# Patient Record
Sex: Male | Born: 1995 | Race: Black or African American | Hispanic: No | Marital: Single | Smoking: Never smoker
Health system: Southern US, Community
[De-identification: ages and names within clinical notes are randomized; demographics above are authoritative.]

## PROBLEM LIST (undated history)

## (undated) DIAGNOSIS — E785 Hyperlipidemia, unspecified: Secondary | ICD-10-CM

## (undated) DIAGNOSIS — I509 Heart failure, unspecified: Secondary | ICD-10-CM

## (undated) DIAGNOSIS — I513 Intracardiac thrombosis, not elsewhere classified: Secondary | ICD-10-CM

## (undated) DIAGNOSIS — I2699 Other pulmonary embolism without acute cor pulmonale: Secondary | ICD-10-CM

## (undated) DIAGNOSIS — I441 Atrioventricular block, second degree: Secondary | ICD-10-CM

## (undated) DIAGNOSIS — I429 Cardiomyopathy, unspecified: Secondary | ICD-10-CM

## (undated) DIAGNOSIS — E119 Type 2 diabetes mellitus without complications: Secondary | ICD-10-CM

## (undated) DIAGNOSIS — I5020 Unspecified systolic (congestive) heart failure: Secondary | ICD-10-CM

## (undated) DIAGNOSIS — I639 Cerebral infarction, unspecified: Secondary | ICD-10-CM

## (undated) DIAGNOSIS — I10 Essential (primary) hypertension: Secondary | ICD-10-CM

## (undated) HISTORY — DX: Intracardiac thrombosis, not elsewhere classified: I51.3

## (undated) HISTORY — DX: Unspecified systolic (congestive) heart failure: I50.20

## (undated) HISTORY — DX: Cardiomyopathy, unspecified: I42.9

## (undated) HISTORY — DX: Hyperlipidemia, unspecified: E78.5

## (undated) HISTORY — DX: Heart failure, unspecified: I50.9

## (undated) HISTORY — DX: Atrioventricular block, second degree: I44.1

## (undated) HISTORY — PX: HAND SURGERY: SHX662

## (undated) HISTORY — DX: Other pulmonary embolism without acute cor pulmonale: I26.99

## (undated) HISTORY — DX: Cerebral infarction, unspecified: I63.9

---

## 2014-05-25 DIAGNOSIS — Z0000 Encounter for general adult medical examination without abnormal findings: Secondary | ICD-10-CM | POA: Insufficient documentation

## 2014-05-25 DIAGNOSIS — E669 Obesity, unspecified: Secondary | ICD-10-CM | POA: Insufficient documentation

## 2014-05-25 DIAGNOSIS — R5381 Other malaise: Secondary | ICD-10-CM | POA: Insufficient documentation

## 2018-06-30 ENCOUNTER — Encounter: Payer: Self-pay | Admitting: Emergency Medicine

## 2018-06-30 ENCOUNTER — Other Ambulatory Visit: Payer: Self-pay

## 2018-06-30 ENCOUNTER — Ambulatory Visit
Admission: EM | Admit: 2018-06-30 | Discharge: 2018-06-30 | Disposition: A | Discharge status: Home / Self Care | Payer: Self-pay | Attending: Family Medicine | Admitting: Family Medicine

## 2018-06-30 DIAGNOSIS — L02811 Cutaneous abscess of head [any part, except face]: Secondary | ICD-10-CM

## 2018-06-30 MED ORDER — SULFAMETHOXAZOLE-TRIMETHOPRIM 800-160 MG PO TABS: 1.0000 | ORAL_TABLET | Freq: Two times a day (BID) | ORAL | 0 refills | Status: DC

## 2018-06-30 NOTE — ED Triage Notes (Signed)
Pt is here today for what he thinks is an insect bite on the back of his neck. He noticed it 4 days ago and has progressively gotten worse since. He had a fever of 100.9 on Saturday. Pt family tried to drain the area and have used peroxide, neosporin and ibuprofen.

## 2018-06-30 NOTE — ED Provider Notes (Signed)
[facility]    CSN: [id number] Arrival date & time: 06/30/18  1833     History   Chief Complaint No chief complaint on file.   HPI Robert Mccann is a 22 y.o. male.   22 yo male with a c/o a bump on the back of his head for 4 days but is now painful and has been draining. States he had a fever over the weekend.   The history is provided by the patient.    History reviewed. No pertinent past medical history.  There are no active problems to display for this patient.   Past Surgical History:  Procedure Laterality Date  . HAND SURGERY Left age 67       Home Medications    Prior to Admission medications   Medication Sig Start Date End Date Taking? Authorizing Provider  sulfamethoxazole-trimethoprim (BACTRIM DS,SEPTRA DS) 800-160 MG tablet Take 1 tablet by mouth 2 (two) times daily. 06/30/18   Payton Mccallum, MD    Family History No family history on file.  Social History Social History   Tobacco Use  . Smoking status: Never Smoker  . Smokeless tobacco: Never Used  Substance Use Topics  . Alcohol use: Never    Frequency: Never  . Drug use: Never     Allergies   Patient has no allergy information on record.   Review of Systems Review of Systems   Physical Exam Triage Vital Signs ED Triage Vitals  Enc Vitals Group     BP 06/30/18 1859 (!) 159/117     Pulse Rate 06/30/18 1859 (!) 109     Resp 06/30/18 1859 18     Temp 06/30/18 1859 98.8 F (37.1 C)     Temp Source 06/30/18 1859 Oral     SpO2 06/30/18 1859 99 %     Weight 06/30/18 1855 (!) 370 lb (167.8 kg)     Height 06/30/18 1855 5\' 11"  (1.803 m)     Head Circumference --      Peak Flow --      Pain Score 06/30/18 1855 9     Pain Loc --      Pain Edu? --      Excl. in GC? --    No data found.  Updated Vital Signs BP (!) 159/117 (BP Location: Left Arm)   Pulse (!) 109   Temp 98.8 F (37.1 C) (Oral)   Resp 18   Ht 5\' 11"  (1.803 m)   Wt (!) 370 lb (167.8 kg)   SpO2 99%    BMI 51.60 kg/m   Visual Acuity Right Eye Distance:   Left Eye Distance:   Bilateral Distance:    Right Eye Near:   Left Eye Near:    Bilateral Near:     Physical Exam  Constitutional: He appears well-developed and well-nourished. No distress.  Skin: He is not diaphoretic. There is erythema.  3x4cm skin area on posterior scalp with fluctuance, blanchable erythema, warmth, tenderness to palpation and spontaneous drainage of pus  Nursing note and vitals reviewed.    UC Treatments / Results  Labs (all labs ordered are listed, but only abnormal results are displayed) Labs Reviewed - No data to display  EKG None  Radiology No results found.  Procedures Procedures (including critical care time)  Medications Ordered in UC Medications - No data to display  Initial Impression / Assessment and Plan / UC Course  I have reviewed the triage vital signs and the nursing notes.  Pertinent labs & imaging results that were available during my care of the patient were reviewed by me and considered in my medical decision making (see chart for details).      Final Clinical Impressions(s) / UC Diagnoses   Final diagnoses:  Scalp abscess     Discharge Instructions     Warm compresses to area     ED Prescriptions    Medication Sig Dispense Auth. Provider   sulfamethoxazole-trimethoprim (BACTRIM DS,SEPTRA DS) 800-160 MG tablet Take 1 tablet by mouth 2 (two) times daily. 20 tablet Payton Mccallum, MD     1. diagnosis reviewed with patient 2. rx as per orders above; reviewed possible side effects, interactions, risks and benefits  3. Recommend supportive treatment with warm compresses to area, otc analgesics prn 4. Follow-up prn if symptoms worsen or don't improve  Controlled Substance Prescriptions [place] Controlled Substance Registry consulted? Not Applicable   Payton Mccallum, MD 06/30/18 [phone]

## 2018-06-30 NOTE — Discharge Instructions (Signed)
Warm compresses to area °

## 2019-08-17 DIAGNOSIS — Z7189 Other specified counseling: Secondary | ICD-10-CM | POA: Diagnosis not present

## 2019-08-17 DIAGNOSIS — Z1389 Encounter for screening for other disorder: Secondary | ICD-10-CM | POA: Diagnosis not present

## 2019-08-17 DIAGNOSIS — R030 Elevated blood-pressure reading, without diagnosis of hypertension: Secondary | ICD-10-CM | POA: Diagnosis not present

## 2019-08-19 DIAGNOSIS — H6123 Impacted cerumen, bilateral: Secondary | ICD-10-CM | POA: Diagnosis not present

## 2019-08-19 DIAGNOSIS — Z833 Family history of diabetes mellitus: Secondary | ICD-10-CM | POA: Diagnosis not present

## 2019-08-19 DIAGNOSIS — R030 Elevated blood-pressure reading, without diagnosis of hypertension: Secondary | ICD-10-CM | POA: Diagnosis not present

## 2019-10-05 ENCOUNTER — Ambulatory Visit
Admission: EM | Admit: 2019-10-05 | Discharge: 2019-10-05 | Disposition: A | Discharge status: Other Acute Inpatient Hospital | Payer: Self-pay

## 2020-07-28 DIAGNOSIS — Z20822 Contact with and (suspected) exposure to covid-19: Secondary | ICD-10-CM | POA: Diagnosis not present

## 2021-03-25 DIAGNOSIS — Z20822 Contact with and (suspected) exposure to covid-19: Secondary | ICD-10-CM | POA: Diagnosis not present

## 2021-06-27 ENCOUNTER — Emergency Department: Payer: BC Managed Care – PPO

## 2021-06-27 ENCOUNTER — Other Ambulatory Visit: Payer: Self-pay

## 2021-06-27 ENCOUNTER — Inpatient Hospital Stay
Admission: EM | Admit: 2021-06-27 | Discharge: 2021-06-30 | DRG: 163 | Disposition: A | Discharge status: Home / Self Care | Payer: BC Managed Care – PPO | Attending: Internal Medicine | Admitting: Internal Medicine

## 2021-06-27 DIAGNOSIS — R739 Hyperglycemia, unspecified: Secondary | ICD-10-CM

## 2021-06-27 DIAGNOSIS — I5021 Acute systolic (congestive) heart failure: Secondary | ICD-10-CM | POA: Diagnosis not present

## 2021-06-27 DIAGNOSIS — G4733 Obstructive sleep apnea (adult) (pediatric): Secondary | ICD-10-CM | POA: Diagnosis not present

## 2021-06-27 DIAGNOSIS — I110 Hypertensive heart disease with heart failure: Secondary | ICD-10-CM | POA: Diagnosis present

## 2021-06-27 DIAGNOSIS — E119 Type 2 diabetes mellitus without complications: Secondary | ICD-10-CM

## 2021-06-27 DIAGNOSIS — E66813 Obesity, class 3: Secondary | ICD-10-CM

## 2021-06-27 DIAGNOSIS — I509 Heart failure, unspecified: Secondary | ICD-10-CM

## 2021-06-27 DIAGNOSIS — I2609 Other pulmonary embolism with acute cor pulmonale: Principal | ICD-10-CM

## 2021-06-27 DIAGNOSIS — R079 Chest pain, unspecified: Secondary | ICD-10-CM | POA: Diagnosis not present

## 2021-06-27 DIAGNOSIS — I10 Essential (primary) hypertension: Secondary | ICD-10-CM | POA: Diagnosis present

## 2021-06-27 DIAGNOSIS — R7989 Other specified abnormal findings of blood chemistry: Secondary | ICD-10-CM

## 2021-06-27 DIAGNOSIS — E1165 Type 2 diabetes mellitus with hyperglycemia: Secondary | ICD-10-CM | POA: Diagnosis present

## 2021-06-27 DIAGNOSIS — I517 Cardiomegaly: Secondary | ICD-10-CM | POA: Diagnosis not present

## 2021-06-27 DIAGNOSIS — R0789 Other chest pain: Secondary | ICD-10-CM | POA: Diagnosis not present

## 2021-06-27 DIAGNOSIS — J90 Pleural effusion, not elsewhere classified: Secondary | ICD-10-CM | POA: Diagnosis not present

## 2021-06-27 DIAGNOSIS — Z20822 Contact with and (suspected) exposure to covid-19: Secondary | ICD-10-CM | POA: Diagnosis not present

## 2021-06-27 DIAGNOSIS — I420 Dilated cardiomyopathy: Secondary | ICD-10-CM | POA: Diagnosis not present

## 2021-06-27 DIAGNOSIS — Z833 Family history of diabetes mellitus: Secondary | ICD-10-CM | POA: Diagnosis not present

## 2021-06-27 DIAGNOSIS — Z8249 Family history of ischemic heart disease and other diseases of the circulatory system: Secondary | ICD-10-CM | POA: Diagnosis not present

## 2021-06-27 DIAGNOSIS — I2699 Other pulmonary embolism without acute cor pulmonale: Secondary | ICD-10-CM | POA: Diagnosis present

## 2021-06-27 DIAGNOSIS — E1169 Type 2 diabetes mellitus with other specified complication: Secondary | ICD-10-CM

## 2021-06-27 DIAGNOSIS — Z6841 Body Mass Index (BMI) 40.0 and over, adult: Secondary | ICD-10-CM | POA: Diagnosis not present

## 2021-06-27 DIAGNOSIS — Z86711 Personal history of pulmonary embolism: Secondary | ICD-10-CM | POA: Diagnosis not present

## 2021-06-27 DIAGNOSIS — I5020 Unspecified systolic (congestive) heart failure: Secondary | ICD-10-CM

## 2021-06-27 DIAGNOSIS — I429 Cardiomyopathy, unspecified: Secondary | ICD-10-CM | POA: Diagnosis not present

## 2021-06-27 DIAGNOSIS — R600 Localized edema: Secondary | ICD-10-CM | POA: Diagnosis not present

## 2021-06-27 DIAGNOSIS — R0902 Hypoxemia: Secondary | ICD-10-CM | POA: Diagnosis present

## 2021-06-27 DIAGNOSIS — R0602 Shortness of breath: Secondary | ICD-10-CM

## 2021-06-27 DIAGNOSIS — Z9114 Patient's other noncompliance with medication regimen: Secondary | ICD-10-CM | POA: Diagnosis not present

## 2021-06-27 DIAGNOSIS — I428 Other cardiomyopathies: Secondary | ICD-10-CM | POA: Diagnosis not present

## 2021-06-27 DIAGNOSIS — R778 Other specified abnormalities of plasma proteins: Secondary | ICD-10-CM

## 2021-06-27 DIAGNOSIS — E1141 Type 2 diabetes mellitus with diabetic mononeuropathy: Secondary | ICD-10-CM

## 2021-06-27 DIAGNOSIS — R160 Hepatomegaly, not elsewhere classified: Secondary | ICD-10-CM | POA: Diagnosis not present

## 2021-06-27 HISTORY — DX: Type 2 diabetes mellitus without complications: E11.9

## 2021-06-27 HISTORY — DX: Essential (primary) hypertension: I10

## 2021-06-27 LAB — BASIC METABOLIC PANEL
Anion gap: 7 (ref 5–15)
BUN: 9 mg/dL (ref 6–20)
CO2: 25 mmol/L (ref 22–32)
Calcium: 8.8 mg/dL — ABNORMAL LOW (ref 8.9–10.3)
Chloride: 104 mmol/L (ref 98–111)
Creatinine, Ser: 0.95 mg/dL (ref 0.61–1.24)
GFR, Estimated: >60 mL/min (ref >60)
Glucose, Bld: 189 mg/dL — ABNORMAL HIGH (ref 70–99)
Potassium: 3.7 mmol/L (ref 3.5–5.1)
Sodium: 136 mmol/L (ref 135–145)

## 2021-06-27 LAB — CBC
HCT: 43.6 % (ref 39.0–52.0)
Hemoglobin: 14.7 g/dL (ref 13.0–17.0)
MCH: 30.6 pg (ref 26.0–34.0)
MCHC: 33.7 g/dL (ref 30.0–36.0)
MCV: 90.6 fL (ref 80.0–100.0)
Platelets: 278 10*3/uL (ref 150–400)
RBC: 4.81 MIL/uL (ref 4.22–5.81)
RDW: 12.2 % (ref 11.5–15.5)
WBC: 8.1 10*3/uL (ref 4.0–10.5)
nRBC: 0 % (ref 0.0–0.2)

## 2021-06-27 LAB — RESP PANEL BY RT-PCR (FLU A&B, COVID) ARPGX2
Influenza A by PCR: NEGATIVE
Influenza B by PCR: NEGATIVE
SARS Coronavirus 2 by RT PCR: NEGATIVE

## 2021-06-27 LAB — PROTIME-INR
INR: 1.1 (ref 0.8–1.2)
Prothrombin Time: 14.2 seconds (ref 11.4–15.2)

## 2021-06-27 LAB — TROPONIN I (HIGH SENSITIVITY)
Troponin I (High Sensitivity): 38 ng/L — ABNORMAL HIGH (ref <18)
Troponin I (High Sensitivity): 35 ng/L — ABNORMAL HIGH (ref <18)

## 2021-06-27 LAB — BRAIN NATRIURETIC PEPTIDE: B Natriuretic Peptide: 740.6 pg/mL — ABNORMAL HIGH (ref 0.0–100.0)

## 2021-06-27 LAB — FIBRINOGEN: Fibrinogen: 510 mg/dL — ABNORMAL HIGH (ref 210–475)

## 2021-06-27 LAB — APTT: aPTT: 30 seconds (ref 24–36)

## 2021-06-27 MED ORDER — SODIUM CHLORIDE 0.9 % IV SOLN: 250.0000 mL | INTRAVENOUS | Status: DC | PRN

## 2021-06-27 MED ORDER — HEPARIN BOLUS VIA INFUSION
7000.0000 [IU] | Freq: Once | INTRAVENOUS | Status: AC
  Administered 2021-06-27: 7000 [IU] via INTRAVENOUS
  Filled 2021-06-27: qty 7000

## 2021-06-27 MED ORDER — ACETAMINOPHEN 650 MG RE SUPP: 650.0000 mg | Freq: Four times a day (QID) | RECTAL | Status: DC | PRN

## 2021-06-27 MED ORDER — SODIUM CHLORIDE 0.9% FLUSH: 3.0000 mL | INTRAVENOUS | Status: DC | PRN

## 2021-06-27 MED ORDER — ACETAMINOPHEN 325 MG PO TABS: 650.0000 mg | ORAL_TABLET | Freq: Four times a day (QID) | ORAL | Status: DC | PRN

## 2021-06-27 MED ORDER — SODIUM CHLORIDE 0.9% FLUSH
3.0000 mL | Freq: Two times a day (BID) | INTRAVENOUS | Status: DC
  Administered 2021-06-28 – 2021-06-30 (×4): 3 mL via INTRAVENOUS

## 2021-06-27 MED ORDER — IOHEXOL 350 MG/ML SOLN
100.0000 mL | Freq: Once | INTRAVENOUS | Status: AC | PRN
  Administered 2021-06-27: 100 mL via INTRAVENOUS
  Filled 2021-06-27: qty 100

## 2021-06-27 MED ORDER — HEPARIN (PORCINE) 25000 UT/250ML-% IV SOLN
2100.0000 [IU]/h | INTRAVENOUS | Status: AC
  Administered 2021-06-27 – 2021-06-28 (×2): 2000 [IU]/h via INTRAVENOUS
  Administered 2021-06-29: 2100 [IU]/h via INTRAVENOUS
  Filled 2021-06-27 (×4): qty 250

## 2021-06-27 MED ORDER — INSULIN ASPART 100 UNIT/ML IJ SOLN
0.0000 [IU] | Freq: Three times a day (TID) | INTRAMUSCULAR | Status: DC
  Administered 2021-06-28 – 2021-06-29 (×2): 5 [IU] via SUBCUTANEOUS
  Administered 2021-06-29: 2 [IU] via SUBCUTANEOUS
  Administered 2021-06-29: 4 [IU] via SUBCUTANEOUS
  Administered 2021-06-30: 3 [IU] via SUBCUTANEOUS
  Filled 2021-06-27 (×5): qty 1

## 2021-06-27 MED ORDER — ASPIRIN 81 MG PO CHEW
324.0000 mg | CHEWABLE_TABLET | Freq: Once | ORAL | Status: AC
Start: 2021-06-27 — End: 2021-06-27
  Administered 2021-06-27: 324 mg via ORAL
  Filled 2021-06-27: qty 4

## 2021-06-27 NOTE — ED Notes (Signed)
Dr. Patel at bedside 

## 2021-06-27 NOTE — ED Provider Notes (Signed)
[facility] Emergency Department Provider Note  ____________________________________________   Event Date/Time   First MD Initiated Contact with Patient 06/27/21 1918     (approximate)  I have reviewed the triage vital signs and the nursing notes.   HISTORY  Chief Complaint Chest Pain   HPI Robert Mccann is a 25 y.o. male with past medical history of HTN, DM and obesity who presents accompanied by his mother for assessment of 3 to 4 days of some substernal chest pain described as achy and pressure-like associated with some shortness of breath worsened by exertion, nonproductive cough and some nausea and vomiting yesterday.  Patient denies any fevers, headache, earache, abdominal pain, diarrhea, dysuria, rash or focal extremity weakness or numbness or tingling.  No cardiac or pulmonary history.  He has strong family history of this however.  No EtOH or illicit or tobacco abuse.  No other acute concerns at this time.         Past Medical History:  Diagnosis Date   Diabetes mellitus without complication (HCC)    Hypertension     Patient Active Problem List   Diagnosis Date Noted   DM II (diabetes mellitus, type II), controlled (HCC) 06/27/2021   Chest pain 06/27/2021   HTN (hypertension) 06/27/2021     Past Surgical History:  Procedure Laterality Date   HAND SURGERY Left age 25   HAND SURGERY      Prior to Admission medications   Medication Sig Start Date End Date Taking? Authorizing Provider  BIOTIN PO Take 3,000 mg by mouth daily. Chew 1 gummy daily.   Yes [provider]  Multiple Vitamin (ONE-A-DAY MENS PO) Take 1 tablet by mouth daily.   Yes [provider]  hydrochlorothiazide (MICROZIDE) 12.5 MG capsule Take 12.5 mg by mouth daily. Patient not taking: Reported on 06/27/2021 07/22/20   [provider]  metFORMIN (GLUCOPHAGE) 500 MG tablet Take 500 mg by mouth 2 (two) times daily. Patient not taking: Reported  on 06/27/2021 07/22/20   [provider]  sulfamethoxazole-trimethoprim (BACTRIM DS,SEPTRA DS) 800-160 MG tablet Take 1 tablet by mouth 2 (two) times daily. Patient not taking: Reported on 06/27/2021 06/30/18   Payton Mccallum, MD    Allergies Patient has no known allergies.  Family History  Problem Relation Age of Onset   Heart failure Mother    Diabetes Father    Hypertension Father     Social History Social History   Tobacco Use   Smoking status: Never   Smokeless tobacco: Never  Vaping Use   Vaping Use: Never used  Substance Use Topics   Alcohol use: Never   Drug use: Yes    Types: Marijuana    Comment: 6 days ago    Review of Systems  Review of Systems  Constitutional:  Positive for malaise/fatigue. Negative for chills and fever.  HENT:  Negative for sore throat.   Eyes:  Negative for pain.  Respiratory:  Positive for cough and shortness of breath. Negative for stridor.   Cardiovascular:  Positive for chest pain.  Gastrointestinal:  Positive for nausea and vomiting.  Genitourinary:  Negative for dysuria.  Musculoskeletal:  Negative for myalgias.  Skin:  Negative for rash.  Neurological:  Negative for seizures, loss of consciousness and headaches.  Psychiatric/Behavioral:  Negative for suicidal ideas.   All other systems reviewed and are negative.    ____________________________________________   PHYSICAL EXAM:  VITAL SIGNS: ED Triage Vitals  Enc Vitals Group  BP 06/27/21 1854 (!) 138/113     Pulse Rate 06/27/21 1854 (!) 108     Resp 06/27/21 1854 (!) 21     Temp 06/27/21 1854 98.5 F (36.9 C)     Temp src --      SpO2 06/27/21 1854 99 %     Weight --      Height --      Head Circumference --      Peak Flow --      Pain Score 06/27/21 1851 5     Pain Loc --      Pain Edu? --      Excl. in GC? --    Vitals:   06/27/21 2056 06/27/21 2330  BP: (!) 132/98 138/79  Pulse: 100 (!) 101  Resp: 18 20  Temp:    SpO2: 100% 100%   Physical  Exam Vitals and nursing note reviewed.  Constitutional:      Appearance: He is well-developed. He is obese. He is ill-appearing.  HENT:     Head: Normocephalic and atraumatic.     Right Ear: External ear normal.     Left Ear: External ear normal.     Nose: Nose normal.     Mouth/Throat:     Mouth: Mucous membranes are moist.  Eyes:     Conjunctiva/sclera: Conjunctivae normal.  Cardiovascular:     Rate and Rhythm: Regular rhythm. Tachycardia present.     Heart sounds: No murmur heard. Pulmonary:     Effort: Pulmonary effort is normal. No respiratory distress.     Breath sounds: Normal breath sounds.  Abdominal:     Palpations: Abdomen is soft.     Tenderness: There is no abdominal tenderness.  Musculoskeletal:     Cervical back: Neck supple.     Right lower leg: Edema present.     Left lower leg: Edema present.  Skin:    General: Skin is warm and dry.     Capillary Refill: Capillary refill takes less than 2 seconds.  Neurological:     Mental Status: He is alert and oriented to person, place, and time.  Psychiatric:        Mood and Affect: Mood normal.     ____________________________________________   LABS (all labs ordered are listed, but only abnormal results are displayed)  Labs Reviewed  BASIC METABOLIC PANEL - Abnormal; Notable for the following components:      Result Value   Glucose, Bld 189 (*)    Calcium 8.8 (*)    All other components within normal limits  BRAIN NATRIURETIC PEPTIDE - Abnormal; Notable for the following components:   B Natriuretic Peptide 740.6 (*)    All other components within normal limits  FIBRINOGEN - Abnormal; Notable for the following components:   Fibrinogen 510 (*)    All other components within normal limits  TROPONIN I (HIGH SENSITIVITY) - Abnormal; Notable for the following components:   Troponin I (High Sensitivity) 38 (*)    All other components within normal limits  TROPONIN I (HIGH SENSITIVITY) - Abnormal; Notable for the  following components:   Troponin I (High Sensitivity) 35 (*)    All other components within normal limits  RESP PANEL BY RT-PCR (FLU A&B, COVID) ARPGX2  CBC  APTT  PROTIME-INR  CBC  ANTITHROMBIN III  COMPREHENSIVE METABOLIC PANEL  HEPARIN LEVEL (UNFRACTIONATED)  CARDIOLIPIN ANTIBODIES, IGG, IGM, IGA  BETA-2-GLYCOPROTEIN I ABS, IGG/M/A  FACTOR 5 LEIDEN  HEMOGLOBIN A1C  HIV ANTIBODY (ROUTINE  TESTING W REFLEX)  HOMOCYSTEINE  LUPUS ANTICOAGULANT PANEL  PROTEIN C, TOTAL  PROTEIN S, TOTAL  PROTEIN S ACTIVITY  PROTEIN C ACTIVITY  PROTHROMBIN GENE MUTATION   ____________________________________________  EKG  Sinus tachycardia with ventricular to 110, fairly prominent P waves in lead II and some nonspecific ST changes in lateral leads without other clear evidence of acute ischemia.  Unremarkable intervals. ____________________________________________  RADIOLOGY  ED MD interpretation: No significant cardiomegaly without focal consolidation, effusion, pneumothorax or other clear acute intrathoracic process.  Official radiology report(s): DG Chest 2 View  Result Date: 06/27/2021 CLINICAL DATA:  Chest pain times 3-4 days. EXAM: CHEST - 2 VIEW COMPARISON:  None. FINDINGS: The cardiac silhouette is markedly enlarged. Both lungs are clear. The visualized skeletal structures are unremarkable. IMPRESSION: Cardiomegaly without an acute infiltrate. Sequelae associated with a large pericardial effusion cannot be excluded. Correlation with chest CT is recommended if this is of clinical concern. Electronically Signed   By: Aram Candelahaddeus  Houston M.D.   On: 06/27/2021 19:11   CT Angio Chest PE W and/or Wo Contrast  Result Date: 06/27/2021 CLINICAL DATA:  Chest pain which began 3-4 days ago, since worsening EXAM: CT ANGIOGRAPHY CHEST WITH CONTRAST TECHNIQUE: Multidetector CT imaging of the chest was performed using the standard protocol during bolus administration of intravenous contrast. Multiplanar CT  image reconstructions and MIPs were obtained to evaluate the vascular anatomy. CONTRAST:  100mL OMNIPAQUE IOHEXOL 350 MG/ML SOLN COMPARISON:  Radiograph 06/27/2021 FINDINGS: Cardiovascular: Facet opacification of pulmonary arteries seen extending from the right lower lobar pulmonary artery into the segmental branches of the lower lobe as well as in right upper lobar artery. Relative sparing of the pulmonary arteries of the right middle lobe. No visible filling defects are seen to the segmental level within the left lung. Central pulmonary arteries are normal caliber. While the RV-LV ratio is preserved, underlying cardiomegaly limits the clinical utility of this finding. There is evidence of reflux of contrast into the IVC. No other major venous abnormalities. Exam is not tailored for evaluation of the aorta. No gross acute aortic abnormality is evident. Normal branching of the proximal great vessels. No major venous abnormalities. Mediastinum/Nodes: No mediastinal fluid or gas. Normal thyroid gland and thoracic inlet. No acute abnormality of the trachea or esophagus. No worrisome mediastinal, hilar or axillary adenopathy. Lungs/Pleura: Wedge-shaped regions of peripheral ground-glass are seen in the lateral basal and posterior basal segments of the right lower lobe with a small right pleural effusion. Some additional mild interlobular septal thickening is noted diffusely throughout the lungs on a background of more diffuse hypoventilatory change. No pneumothorax is seen. No concerning pulmonary nodules or masses. Upper Abdomen: Hepatomegaly. No acute abnormalities present in the visualized portions of the upper abdomen. Musculoskeletal: No acute osseous abnormality or suspicious osseous lesion. Review of the MIP images confirms the above findings. IMPRESSION: 1. Pulmonary artery emboli extending from the lobar arteries of the right upper and lower lobe throughout their segmental branches. Wedge-shaped areas of  peripheral ground-glass opacity in the lateral basal and posterior basal segment right lower lobe concerning for developing pulmonary infarct with some trace associated effusion. Interlobular septal thickening, can reflect some mild pulmonary edema as well. 2. While the RV-LV ratio is preserved (0.74) there is underlying cardiomegaly which may limit the clinical utility of this finding. Reflux of contrast into the IVC could suggest elevated right heart pressures and supporting evidence of right heart strain. Consider further interrogation with echocardiography. Critical Value/emergent results were called by telephone  at the time of interpretation on 06/27/2021 at 8:55 pm to provider [facility] , who verbally acknowledged these results. Electronically Signed   By: Kreg Shropshire M.D.   On: 06/27/2021 20:53    ____________________________________________   PROCEDURES  Procedure(s) performed (including Critical Care):  .Critical Care  Date/Time: 06/28/2021 12:02 AM Performed by: Gilles Chiquito, MD Authorized by: Gilles Chiquito, MD   Critical care provider statement:    Critical care time (minutes):  45   Critical care was necessary to treat or prevent imminent or life-threatening deterioration of the following conditions:  Circulatory failure and cardiac failure   Critical care was time spent personally by me on the following activities:  Discussions with consultants, evaluation of patient's response to treatment, examination of patient, ordering and performing treatments and interventions, ordering and review of laboratory studies, ordering and review of radiographic studies, pulse oximetry, re-evaluation of patient's condition, obtaining history from patient or surrogate and review of old charts   ____________________________________________   INITIAL IMPRESSION / ASSESSMENT AND PLAN / ED COURSE      Patient presents with above-stated history exam for assessment of couple days of  worsening chest pain cough shortness of breath and some nausea and vomiting yesterday.  On arrival he is little tachycardic and tachypneic with otherwise stable vital signs on room air.  Primary differential includes bronchitis, pneumonia, pericarditis, myocarditis, PE, symptomatic anemia, metabolic derangements and heart failure.  ECG has multiple nonspecific findings.  Troponin x2 is 38 and 35 which I suspect represents some mild demand ischemia chest x-ray shows fairly significant cardiomegaly and CTA chest obtained has evidence of multiple PEs and possible right ventricular strain given evidence of reflux into the IVC.  CTA also shows some evidence of likely mild edema without clear pneumonia, no thorax, large effusion or other clear acute intrathoracic process.  However there are some areas likely representing some pulmonary infarct.  BMP shows no significant electrolyte or metabolic derangements.  CBC has no leukocytosis or acute anemia.  BNP is quite elevated 740 likely representing mild heart failure given cardiomegaly and edema on exam.  COVID and influenza PCR is negative.  Given findings of pulmonary emboli patient given ASA and started on heparin.  Discussed with on-call cardiologist Dr. Duke Salvia who recommended against diuresis at this time but agreed with anticoagulation and recommended echo.  Also discussed with on-call vascular surgeon Dr. Wyn Quaker who thought the patient would likely be a candidate for intervention but did not need any tonight and would be assessed tomorrow after he underwent echo.  Updated patient and family on clinical concerns and plan for admission anticoagulation.  Admitted to hospitalist service.  ____________________________________________   FINAL CLINICAL IMPRESSION(S) / ED DIAGNOSES  Final diagnoses:  Acute pulmonary embolism with acute cor pulmonale, unspecified pulmonary embolism type (HCC)  Heart failure, unspecified HF chronicity, unspecified heart failure type  (HCC)  Troponin I above reference range  Elevated brain natriuretic peptide (BNP) level  Hyperglycemia    Medications  heparin ADULT infusion 100 units/mL (25000 units/227mL) (2,000 Units/hr Intravenous New Bag/Given 06/27/21 2326)  insulin aspart (novoLOG) injection 0-15 Units (has no administration in time range)  sodium chloride flush (NS) 0.9 % injection 3 mL (3 mLs Intravenous Not Given 06/27/21 2358)  sodium chloride flush (NS) 0.9 % injection 3 mL (has no administration in time range)  0.9 %  sodium chloride infusion (has no administration in time range)  acetaminophen (TYLENOL) tablet 650 mg (has no administration in time range)  Or  acetaminophen (TYLENOL) suppository 650 mg (has no administration in time range)  iohexol (OMNIPAQUE) 350 MG/ML injection 100 mL (100 mLs Intravenous Contrast Given 06/27/21 2036)  aspirin chewable tablet 324 mg (324 mg Oral Given 06/27/21 2100)  heparin bolus via infusion 7,000 Units (7,000 Units Intravenous Bolus from Bag 06/27/21 2326)     ED Discharge Orders     None        Note:  This document was prepared using Dragon voice recognition software and may include unintentional dictation errors.    Gilles Chiquito, MD 06/28/21 [phone]

## 2021-06-27 NOTE — ED Triage Notes (Addendum)
Pt comes with c/o mid sternal CP that started 3-4 days ago. Pt states he was just walking when it started. Pt states it has since gotten worse. Pt states radiation down to his stomach.  Pt states family hx of heart issues.  Pt states SOB especially with exertion.

## 2021-06-27 NOTE — H&P (Addendum)
History and Physical    Robert Mccann SVX:[id number] DOB: 1996/10/28 DOA: 06/27/2021  PCP: Patient, No Pcp Per (Inactive)   Patient coming from:  Home  Chief Complaint:  Chest pain  HPI: Robert Mccann is a 25 y.o. male seen in ed with complaints of seen in the emergency room with complaints of substernal chest pain, ache, pressure, associated with shortness of breath worsened by exertion, nonproductive cough, some nausea and vomiting.  Duration:4 days  Frequency:intermittent   Location:substernal / epigastric  Quality:pressure  Rate:8/10  Radiation:NR  Aggravating:Cough/ exertion. Laying flat hurts his chest.  Alleviating:sleeping/ resting  Associated factors:SOB/ cough/ difficulty sleeping.    Pt has past medical history of obesity, diabetes mellitus type 2, hypertension, THC use. 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 Pt notes his travels he went to [place]; march 2022 and [place] was last weekend, both trips were on road driving.  ED Course:  Vitals:   06/27/21 1928 06/27/21 2027 06/27/21 2029 06/27/21 2056  BP: (!) 136/105   (!) 132/98  Pulse: 87  80 100  Resp: 18  16 18   Temp:      SpO2: 100%  100% 100%  Weight:  (!) 165.6 kg    Height:  5\' 11"  (1.803 m)    Patient is alert awake oriented, hypertensive, afebrile. EKG in the emergency room shows sinus tachycardia with heart rate of 110, prominent P waves in lead II.  Labs show glucose of 189 calcium of 8.8 otherwise normal CMP, troponin of 38, BNP of 740.6, CBC within normal limits, CT chest showed pulmonary artery lobar arteries of right upper and lower lobe segmental branches, pulmonary infarct, right heart strain patient immediately started on IV heparin drip, with bolus heparin infusion, along with an aspirin 324.   Review of Systems:  Review of Systems  Constitutional:  Positive for malaise/fatigue.  Respiratory:  Positive for cough and  shortness of breath.   Cardiovascular:  Positive for chest pain. Negative for orthopnea and leg swelling.  All other systems reviewed and are negative.   Past Medical History:  Diagnosis Date   Diabetes mellitus without complication (HCC)    Hypertension     Past Surgical History:  Procedure Laterality Date   HAND SURGERY Left age 49   HAND SURGERY       reports that he has never smoked. He has never used smokeless tobacco. He reports current drug use. Drug: Marijuana. He reports that he does not drink alcohol.  No Known Allergies  Family History  Problem Relation Age of Onset   Heart failure Mother    Diabetes Father    Hypertension Father     Prior to Admission medications   Medication Sig Start Date End Date Taking? Authorizing Provider  sulfamethoxazole-trimethoprim (BACTRIM DS,SEPTRA DS) 800-160 MG tablet Take 1 tablet by mouth 2 (two) times daily. 06/30/18   14, MD    Physical Exam: Vitals:   06/27/21 1928 06/27/21 2027 06/27/21 2029 06/27/21 2056  BP: (!) 136/105   (!) 132/98  Pulse: 87  80 100  Resp: 18  16 18   Temp:      SpO2: 100%  100% 100%  Weight:  (!) 165.6 kg    Height:  5\' 11"  (1.803 m)     Physical Exam Vitals and nursing note reviewed.  Constitutional:      General: He is not in acute distress.    Appearance: He is obese.  He is not ill-appearing.  HENT:     Head: Normocephalic and atraumatic.     Right Ear: External ear normal.     Left Ear: External ear normal.     Mouth/Throat:     Mouth: Mucous membranes are moist.  Eyes:     Extraocular Movements: Extraocular movements intact.     Pupils: Pupils are equal, round, and reactive to light.  Neck:     Vascular: No carotid bruit.  Cardiovascular:     Rate and Rhythm: Normal rate and regular rhythm.     Pulses: Normal pulses.     Heart sounds: Normal heart sounds.  Pulmonary:     Effort: Pulmonary effort is normal.     Breath sounds: Normal breath sounds.  Abdominal:      General: Bowel sounds are normal. There is no distension.     Palpations: Abdomen is soft.     Tenderness: There is no abdominal tenderness. There is no guarding.  Musculoskeletal:     Right lower leg: No edema.     Left lower leg: No edema.  Skin:    General: Skin is warm.  Neurological:     General: No focal deficit present.     Mental Status: He is alert and oriented to person, place, and time.  Psychiatric:        Mood and Affect: Mood normal.        Behavior: Behavior normal.    Labs on Admission: I have personally reviewed following labs and imaging studies  No results for input(s): CKTOTAL, CKMB, TROPONINI in the last 72 hours. Lab Results  Component Value Date   WBC 8.1 06/27/2021   HGB 14.7 06/27/2021   HCT 43.6 06/27/2021   MCV 90.6 06/27/2021   PLT 278 06/27/2021    Recent Labs  Lab 06/27/21 1853  NA 136  K 3.7  CL 104  CO2 25  BUN 9  CREATININE 0.95  CALCIUM 8.8*  GLUCOSE 189*    COVID-19 Labs No results for input(s): DDIMER, FERRITIN, LDH, CRP in the last 72 hours. Lab Results  Component Value Date   SARSCOV2NAA NEGATIVE 06/27/2021    Radiological Exams on Admission: DG Chest 2 View  Result Date: 06/27/2021 CLINICAL DATA:  Chest pain times 3-4 days. EXAM: CHEST - 2 VIEW COMPARISON:  None. FINDINGS: The cardiac silhouette is markedly enlarged. Both lungs are clear. The visualized skeletal structures are unremarkable. IMPRESSION: Cardiomegaly without an acute infiltrate. Sequelae associated with a large pericardial effusion cannot be excluded. Correlation with chest CT is recommended if this is of clinical concern. Electronically Signed   By: Aram Candela M.D.   On: 06/27/2021 19:11   CT Angio Chest PE W and/or Wo Contrast Result Date: 06/27/2021 CLINICAL DATA:  Chest pain which began 3-4 days ago, since worsening EXAM: CT ANGIOGRAPHY CHEST WITH CONTRAST TECHNIQUE: Multidetector CT imaging of the chest was performed using the standard protocol  during bolus administration of intravenous contrast. Multiplanar CT image reconstructions and MIPs were obtained to evaluate the vascular anatomy. CONTRAST:  OMNIPAQUE IOHEXOL 350 MG/ML SOLN COMPARISON:  Radiograph 06/27/2021 FINDINGS: Cardiovascular: Facet opacification of pulmonary arteries seen extending from the right lower lobar pulmonary artery into the segmental branches of the lower lobe as well as in right upper lobar artery. Relative sparing of the pulmonary arteries of the right middle lobe. No visible filling defects are seen to the segmental level within the left lung. Central pulmonary arteries are normal caliber. While the  RV-LV ratio is preserved, underlying cardiomegaly limits the clinical utility of this finding. There is evidence of reflux of contrast into the IVC. No other major venous abnormalities. Exam is not tailored for evaluation of the aorta. No gross acute aortic abnormality is evident. Normal branching of the proximal great vessels. No major venous abnormalities. Mediastinum/Nodes: No mediastinal fluid or gas. Normal thyroid gland and thoracic inlet. No acute abnormality of the trachea or esophagus. No worrisome mediastinal, hilar or axillary adenopathy. Lungs/Pleura: Wedge-shaped regions of peripheral ground-glass are seen in the lateral basal and posterior basal segments of the right lower lobe with a small right pleural effusion. Some additional mild interlobular septal thickening is noted diffusely throughout the lungs on a background of more diffuse hypoventilatory change. No pneumothorax is seen. No concerning pulmonary nodules or masses. Upper Abdomen: Hepatomegaly. No acute abnormalities present in the visualized portions of the upper abdomen. Musculoskeletal: No acute osseous abnormality or suspicious osseous lesion. Review of the MIP images confirms the above findings.  IMPRESSION:  1. Pulmonary artery emboli extending from the lobar arteries of the right upper and  lower lobe throughout their segmental branches. Wedge-shaped areas of peripheral ground-glass opacity in the lateral basal and posterior basal segment right lower lobe concerning for developing pulmonary infarct with some trace associated effusion. Interlobular septal thickening, can reflect some mild pulmonary edema as well.  2. While the RV-LV ratio is preserved (0.74) there is underlying cardiomegaly which may limit the clinical utility of this finding. Reflux of contrast into the IVC could suggest elevated right heart pressures and supporting evidence of right heart strain. Consider further interrogation with echocardiography. Critical Value/emergent results were called by telephone at the time of interpretation on 06/27/2021 at 8:55 pm to provider [facility] , who verbally acknowledged these results. Electronically Signed   By: Kreg Shropshire M.D.   On: 06/27/2021 20:53    EKG: Independently reviewed.  Sinus tachycardia with prominent P wave nonspecific ST changes. Echocardiogram: Ordered  Assessment/Plan Principal Problem:   Chest pain Active Problems:   DM II (diabetes mellitus, type II), controlled (HCC)   HTN (hypertension)  Chest pain: Attribute to Extensive pulmonary emboli. Admit to stepdown until for cardiac monitoring.  We will continue pt on heparin drip PE protocol. Hypercoagulable labs ordered. We will order echocardiogram. Cardiology and vascular consult per am team.  PRN tylenol and pain meds.  DM II: Ssi/ a1c/ accuchecks and glycemic protocol. We will hold metformin.  HTN: Blood pressure (!) 132/98, pulse 100, temperature 98.5 F (36.9 C), resp. rate 18, height 5\' 11"  (1.803 m), weight (!) 165.6 kg, SpO2 100 %. Pt does not take his high blood pressure medicine and has been off since 2 years.    DVT prophylaxis:  Heparin drip   Code Status:  Full code  Family Communication:  Rosie, [place] (Mother)  [phone] (Mobile)   Disposition Plan:   Home  Consults called:  None  Admission status: Inpatient   Gertha Calkin MD [facility] [phone] How to contact the [facility] Attending or Consulting provider 7A - 7P or covering provider during after hours 7P -7A, for this patient.    Check the care team in [facility] and look for a) attending/consulting [facility] provider listed and b) the [facility] team listed Log into [email or web address] and use [facility] universal password to access. If you do not have the password, please contact the hospital operator. Locate the [facility] provider you are looking for under [facility] and page to a number that  you can be directly reached. If you still have difficulty reaching the provider, please page the [facility] (Director on Call) for the Hospitalists listed on amion for assistance. [email or web address] Password [place] 06/27/2021, 10:36 PM

## 2021-06-27 NOTE — ED Notes (Signed)
Phlebotomy contacted for collection of APTT/PTINR.

## 2021-06-27 NOTE — ED Notes (Signed)
Patient transported to CT 

## 2021-06-27 NOTE — Consult Note (Signed)
ANTICOAGULATION CONSULT NOTE  Pharmacy Consult for IV Heparin Indication: pulmonary embolus  Patient Measurements: Heparin Dosing Weight: 115.6 kg  Labs: Recent Labs    06/27/21 1853  HGB 14.7  HCT 43.6  PLT 278  CREATININE 0.95  TROPONINIHS 38*    Estimated Creatinine Clearance: 185.7 mL/min (by C-G formula based on SCr of 0.95 mg/dL).   Medications:  No anticoagulation prior to admission per my chart review. Medication reconciliation is pending.  Assessment: Patient is a 25 y/o M with medical history including obesity who presented to the ED 6/28 with mid-sternal chest pain and shortness of breath with exertion. CTA positive for PE. Patient is hemodynamically stable with mild tachycardia. Pharmacy has been consulted to initiate heparin infusion for PE.  Baseline aPTT and PT-INR pending. Baseline CBC within normal limits.   Goal of Therapy:  Heparin level 0.3-0.7 units/ml Monitor platelets by anticoagulation protocol: Yes   Plan:  --Heparin 7000 unit IV bolus x 1 followed by continuous infusion at 2000 units/hr --Heparin level 6 hours after initiation of infusion --Daily CBC per protocol while on IV heparin  Tressie Ellis 06/27/2021,9:08 PM

## 2021-06-27 NOTE — ED Notes (Signed)
Patient reports chest pain and SOB x few days. Patient reports hx of HTN, but denies daily meds r/t no PCP. Patient denies other symptoms.

## 2021-06-27 NOTE — ED Notes (Signed)
ED Provider at bedside. 

## 2021-06-28 ENCOUNTER — Inpatient Hospital Stay: Payer: BC Managed Care – PPO

## 2021-06-28 ENCOUNTER — Encounter
Admission: EM | Disposition: A | Discharge status: Home / Self Care | Payer: Self-pay | Source: Home / Self Care | Attending: Internal Medicine

## 2021-06-28 ENCOUNTER — Inpatient Hospital Stay (HOSPITAL_COMMUNITY)
Admit: 2021-06-28 | Discharge: 2021-06-28 | Disposition: A | Discharge status: Home / Self Care | Payer: BC Managed Care – PPO | Attending: Physician Assistant | Admitting: Physician Assistant

## 2021-06-28 DIAGNOSIS — I429 Cardiomyopathy, unspecified: Secondary | ICD-10-CM

## 2021-06-28 DIAGNOSIS — I10 Essential (primary) hypertension: Secondary | ICD-10-CM

## 2021-06-28 DIAGNOSIS — I5020 Unspecified systolic (congestive) heart failure: Secondary | ICD-10-CM

## 2021-06-28 DIAGNOSIS — I2609 Other pulmonary embolism with acute cor pulmonale: Principal | ICD-10-CM

## 2021-06-28 DIAGNOSIS — I2699 Other pulmonary embolism without acute cor pulmonale: Secondary | ICD-10-CM

## 2021-06-28 DIAGNOSIS — Z86711 Personal history of pulmonary embolism: Secondary | ICD-10-CM | POA: Diagnosis present

## 2021-06-28 DIAGNOSIS — R778 Other specified abnormalities of plasma proteins: Secondary | ICD-10-CM

## 2021-06-28 DIAGNOSIS — I509 Heart failure, unspecified: Secondary | ICD-10-CM

## 2021-06-28 HISTORY — PX: PULMONARY THROMBECTOMY: CATH118295

## 2021-06-28 LAB — CBC
HCT: 41.6 % (ref 39.0–52.0)
Hemoglobin: 14.3 g/dL (ref 13.0–17.0)
MCH: 30.7 pg (ref 26.0–34.0)
MCHC: 34.4 g/dL (ref 30.0–36.0)
MCV: 89.3 fL (ref 80.0–100.0)
Platelets: 267 10*3/uL (ref 150–400)
RBC: 4.66 MIL/uL (ref 4.22–5.81)
RDW: 12.2 % (ref 11.5–15.5)
WBC: 8.9 10*3/uL (ref 4.0–10.5)
nRBC: 0 % (ref 0.0–0.2)

## 2021-06-28 LAB — COMPREHENSIVE METABOLIC PANEL
ALT: 59 U/L — ABNORMAL HIGH (ref 0–44)
AST: 58 U/L — ABNORMAL HIGH (ref 15–41)
Albumin: 3.3 g/dL — ABNORMAL LOW (ref 3.5–5.0)
Alkaline Phosphatase: 84 U/L (ref 38–126)
Anion gap: 12 (ref 5–15)
BUN: 6 mg/dL (ref 6–20)
CO2: 23 mmol/L (ref 22–32)
Calcium: 8.8 mg/dL — ABNORMAL LOW (ref 8.9–10.3)
Chloride: 101 mmol/L (ref 98–111)
Creatinine, Ser: 0.86 mg/dL (ref 0.61–1.24)
GFR, Estimated: >60 mL/min (ref >60)
Glucose, Bld: 227 mg/dL — ABNORMAL HIGH (ref 70–99)
Potassium: 3.9 mmol/L (ref 3.5–5.1)
Sodium: 136 mmol/L (ref 135–145)
Total Bilirubin: 1.5 mg/dL — ABNORMAL HIGH (ref 0.3–1.2)
Total Protein: 6.9 g/dL (ref 6.5–8.1)

## 2021-06-28 LAB — ECHOCARDIOGRAM COMPLETE
AR max vel: 2.49 cm2
AV Area VTI: 2.19 cm2
AV Area mean vel: 2.39 cm2
AV Mean grad: 2 mmHg
AV Peak grad: 2.6 mmHg
Ao pk vel: 0.81 m/s
Area-P 1/2: 6.07 cm2
Height: 71 in
MV VTI: 2.04 cm2
S' Lateral: 5.8 cm
Weight: 5840 oz

## 2021-06-28 LAB — CBG MONITORING, ED
Glucose-Capillary: 224 mg/dL — ABNORMAL HIGH (ref 70–99)
Glucose-Capillary: 227 mg/dL — ABNORMAL HIGH (ref 70–99)

## 2021-06-28 LAB — URINE DRUG SCREEN, QUALITATIVE (ARMC ONLY)
Amphetamines, Ur Screen: NOT DETECTED
Barbiturates, Ur Screen: NOT DETECTED
Benzodiazepine, Ur Scrn: POSITIVE — AB
Cannabinoid 50 Ng, Ur ~~LOC~~: POSITIVE — AB
Cocaine Metabolite,Ur ~~LOC~~: NOT DETECTED
MDMA (Ecstasy)Ur Screen: NOT DETECTED
Methadone Scn, Ur: NOT DETECTED
Opiate, Ur Screen: NOT DETECTED
Phencyclidine (PCP) Ur S: NOT DETECTED
Tricyclic, Ur Screen: NOT DETECTED

## 2021-06-28 LAB — GLUCOSE, CAPILLARY
Glucose-Capillary: 232 mg/dL — ABNORMAL HIGH (ref 70–99)
Glucose-Capillary: 194 mg/dL — ABNORMAL HIGH (ref 70–99)
Glucose-Capillary: 264 mg/dL — ABNORMAL HIGH (ref 70–99)

## 2021-06-28 LAB — LIPID PANEL
Cholesterol: 149 mg/dL (ref 0–200)
HDL: 31 mg/dL — ABNORMAL LOW (ref >40)
LDL Cholesterol: 92 mg/dL (ref 0–99)
Total CHOL/HDL Ratio: 4.8 RATIO
Triglycerides: 129 mg/dL (ref <150)
VLDL: 26 mg/dL (ref 0–40)

## 2021-06-28 LAB — ANTITHROMBIN III: AntiThromb III Func: 118 % (ref 75–120)

## 2021-06-28 LAB — HIV ANTIBODY (ROUTINE TESTING W REFLEX): HIV Screen 4th Generation wRfx: NONREACTIVE

## 2021-06-28 LAB — HEPARIN LEVEL (UNFRACTIONATED)
Heparin Unfractionated: 0.67 IU/mL (ref 0.30–0.70)
Heparin Unfractionated: 0.29 IU/mL — ABNORMAL LOW (ref 0.30–0.70)

## 2021-06-28 SURGERY — PULMONARY THROMBECTOMY
Anesthesia: Moderate Sedation

## 2021-06-28 MED ORDER — HYDRALAZINE HCL 20 MG/ML IJ SOLN
INTRAMUSCULAR | Status: AC
  Filled 2021-06-28: qty 1

## 2021-06-28 MED ORDER — IODIXANOL 320 MG/ML IV SOLN
INTRAVENOUS | Status: DC | PRN
  Administered 2021-06-28: 40 mL via INTRA_ARTERIAL

## 2021-06-28 MED ORDER — FUROSEMIDE 10 MG/ML IJ SOLN
40.0000 mg | Freq: Two times a day (BID) | INTRAMUSCULAR | Status: DC
  Administered 2021-06-28 – 2021-06-29 (×4): 40 mg via INTRAVENOUS
  Filled 2021-06-28 (×4): qty 4

## 2021-06-28 MED ORDER — SPIRONOLACTONE 25 MG PO TABS: 25.0000 mg | ORAL_TABLET | Freq: Every day | ORAL | Status: DC

## 2021-06-28 MED ORDER — LISINOPRIL 10 MG PO TABS: 5.0000 mg | ORAL_TABLET | Freq: Every day | ORAL | Status: DC

## 2021-06-28 MED ORDER — INSULIN GLARGINE 100 UNIT/ML ~~LOC~~ SOLN
15.0000 [IU] | Freq: Every day | SUBCUTANEOUS | Status: DC
  Administered 2021-06-28 – 2021-06-29 (×2): 15 [IU] via SUBCUTANEOUS
  Filled 2021-06-28 (×4): qty 0.15

## 2021-06-28 MED ORDER — SODIUM CHLORIDE 0.9 % IV SOLN: INTRAVENOUS | Status: DC

## 2021-06-28 MED ORDER — CEFAZOLIN SODIUM-DEXTROSE 2-4 GM/100ML-% IV SOLN
INTRAVENOUS | Status: AC
  Administered 2021-06-28: 2 g via INTRAVENOUS
  Filled 2021-06-28: qty 100

## 2021-06-28 MED ORDER — INSULIN ASPART 100 UNIT/ML IJ SOLN
0.0000 [IU] | Freq: Every day | INTRAMUSCULAR | Status: DC
  Administered 2021-06-28: 3 [IU] via SUBCUTANEOUS
  Filled 2021-06-28: qty 1

## 2021-06-28 MED ORDER — CEFAZOLIN SODIUM-DEXTROSE 2-4 GM/100ML-% IV SOLN: 2.0000 g | INTRAVENOUS | Status: DC

## 2021-06-28 MED ORDER — FENTANYL CITRATE (PF) 100 MCG/2ML IJ SOLN
INTRAMUSCULAR | Status: DC | PRN
  Administered 2021-06-28: 50 ug via INTRAVENOUS

## 2021-06-28 MED ORDER — HEPARIN SODIUM (PORCINE) 1000 UNIT/ML IJ SOLN
INTRAMUSCULAR | Status: AC
  Filled 2021-06-28: qty 1

## 2021-06-28 MED ORDER — INSULIN ASPART 100 UNIT/ML IJ SOLN
0.0000 [IU] | Freq: Three times a day (TID) | INTRAMUSCULAR | Status: DC
  Administered 2021-06-28: 2 [IU] via SUBCUTANEOUS
  Filled 2021-06-28: qty 1

## 2021-06-28 MED ORDER — ALTEPLASE 2 MG IJ SOLR
INTRAMUSCULAR | Status: AC
  Filled 2021-06-28: qty 6

## 2021-06-28 MED ORDER — ALTEPLASE 2 MG IJ SOLR
INTRAMUSCULAR | Status: DC | PRN
  Administered 2021-06-28: 6 mg

## 2021-06-28 MED ORDER — CARVEDILOL 12.5 MG PO TABS
12.5000 mg | ORAL_TABLET | Freq: Two times a day (BID) | ORAL | Status: DC
  Administered 2021-06-28 – 2021-06-29 (×2): 12.5 mg via ORAL
  Filled 2021-06-28 (×3): qty 1

## 2021-06-28 MED ORDER — HYDRALAZINE HCL 20 MG/ML IJ SOLN
10.0000 mg | INTRAMUSCULAR | Status: DC | PRN
  Administered 2021-06-28: 10 mg via INTRAVENOUS

## 2021-06-28 MED ORDER — MIDAZOLAM HCL 5 MG/5ML IJ SOLN
INTRAMUSCULAR | Status: AC
  Filled 2021-06-28: qty 5

## 2021-06-28 MED ORDER — SACUBITRIL-VALSARTAN 24-26 MG PO TABS
1.0000 | ORAL_TABLET | Freq: Two times a day (BID) | ORAL | Status: DC
  Administered 2021-06-28 – 2021-06-30 (×4): 1 via ORAL
  Filled 2021-06-28 (×4): qty 1

## 2021-06-28 MED ORDER — METOPROLOL TARTRATE 50 MG PO TABS: 25.0000 mg | ORAL_TABLET | Freq: Two times a day (BID) | ORAL | Status: DC

## 2021-06-28 MED ORDER — FENTANYL CITRATE (PF) 100 MCG/2ML IJ SOLN
INTRAMUSCULAR | Status: AC
  Filled 2021-06-28: qty 2

## 2021-06-28 MED ORDER — MIDAZOLAM HCL 2 MG/2ML IJ SOLN
INTRAMUSCULAR | Status: DC | PRN
  Administered 2021-06-28: 2 mg via INTRAVENOUS

## 2021-06-28 MED ORDER — MORPHINE SULFATE (PF) 2 MG/ML IV SOLN
2.0000 mg | INTRAVENOUS | Status: DC | PRN
  Administered 2021-06-28: 2 mg via INTRAVENOUS
  Filled 2021-06-28: qty 1

## 2021-06-28 MED ORDER — PERFLUTREN LIPID MICROSPHERE
1.0000 mL | INTRAVENOUS | Status: AC | PRN
  Administered 2021-06-28: 2 mL via INTRAVENOUS
  Filled 2021-06-28: qty 10

## 2021-06-28 SURGICAL SUPPLY — 13 items
CANISTER PENUMBRA ENGINE (MISCELLANEOUS) ×3 IMPLANT
CATH ANGIO 5F PIGTAIL 100CM (CATHETERS) ×3 IMPLANT
CATH INDIGO SEP 8 (CATHETERS) ×3 IMPLANT
CATH INFINITI JR4 5F (CATHETERS) ×3 IMPLANT
CATH LIGHTNING 8 XTORQ 115 (CATHETERS) ×3 IMPLANT
COVER PROBE U/S 5X48 (MISCELLANEOUS) ×3 IMPLANT
GLIDEWIRE ADV .035X180CM (WIRE) ×3 IMPLANT
PACK ANGIOGRAPHY (CUSTOM PROCEDURE TRAY) ×3 IMPLANT
SHEATH 9FRX11 (SHEATH) ×3 IMPLANT
SUT PROLENE 0 CT 1 30 (SUTURE) ×3 IMPLANT
TUBING CONTRAST HIGH PRESS 72 (TUBING) ×3 IMPLANT
WIRE GUIDERIGHT .035X150 (WIRE) ×3 IMPLANT
WIRE MAGIC TORQUE 260C (WIRE) ×3 IMPLANT

## 2021-06-28 NOTE — ED Notes (Signed)
MD at bedside. 

## 2021-06-28 NOTE — ED Notes (Signed)
Patient is resting comfortably. 

## 2021-06-28 NOTE — Progress Notes (Signed)
*  PRELIMINARY RESULTS* Echocardiogram 2D Echocardiogram has been performed.  Robert Mccann Robert Mccann 06/28/2021, 10:12 AM

## 2021-06-28 NOTE — ED Notes (Signed)
Risks, mechanism of action, purpose, and precautions regarding Heparin infusion discussed with patient. Patient verbalized understanding.

## 2021-06-28 NOTE — Consult Note (Signed)
ANTICOAGULATION CONSULT NOTE  Pharmacy Consult for IV Heparin Indication: pulmonary embolus  Patient Measurements: Heparin Dosing Weight: 115.6 kg  Labs: Recent Labs    06/27/21 1853 06/27/21 2053 06/27/21 2304  HGB 14.7  --   --   HCT 43.6  --   --   PLT 278  --   --   APTT  --   --  30  LABPROT  --   --  14.2  INR  --   --  1.1  CREATININE 0.95  --   --   TROPONINIHS 38* 35*  --      Estimated Creatinine Clearance: 185.7 mL/min (by C-G formula based on SCr of 0.95 mg/dL).   Medications:  No anticoagulation prior to admission per my chart review. Medication reconciliation is pending.  Assessment: Patient is a 25 y/o M with medical history including obesity who presented to the ED 6/28 with mid-sternal chest pain and shortness of breath with exertion. CTA positive for PE. Patient is hemodynamically stable with mild tachycardia.   Pharmacy has been consulted to initiate/monitor heparin infusion for PE.  Baseline aPTT, CBC, and INR wnl's.   0629 0704 HL 0.67 @ 2000 units/hr - therapeutic x 1  Goal of Therapy:  Heparin level 0.3-0.7 units/ml Monitor platelets by anticoagulation protocol: Yes   Plan:  --Continue heparin drip at current rate of 2000 units/hr --Heparin level in 6 hours  for confirmatory level --Daily CBC per protocol while on IV heparin  Albina Billet, PharmD, BCPS Clinical Pharmacist 06/28/2021 7:24 AM

## 2021-06-28 NOTE — Progress Notes (Signed)
[facility]  - [place] [facility]   PATIENT NAME: Robert Mccann    MR#:  [id number]  DATE OF BIRTH:  07-05-1996  SUBJECTIVE:   Seen in [facility]. Dad in the room Came in with SOB and chest pain Sats 98% on RA Travelled from [place] in bus 10-12 hour trip REVIEW OF SYSTEMS:   Review of Systems  Constitutional:  Negative for chills, fever and weight loss.  HENT:  Negative for ear discharge, ear pain and nosebleeds.   Eyes:  Negative for blurred vision, pain and discharge.  Respiratory:  Negative for sputum production, shortness of breath, wheezing and stridor.   Cardiovascular:  Negative for chest pain, palpitations, orthopnea and PND.  Gastrointestinal:  Negative for abdominal pain, diarrhea, nausea and vomiting.  Genitourinary:  Negative for frequency and urgency.  Musculoskeletal:  Negative for back pain and joint pain.  Neurological:  Negative for sensory change, speech change, focal weakness and weakness.  Psychiatric/Behavioral:  Negative for depression and hallucinations. The patient is not nervous/anxious.   Tolerating Diet: Yes Tolerating PT:   DRUG ALLERGIES:  No Known Allergies  VITALS:  Blood pressure 132/70, pulse (!) 105, temperature 98 F (36.7 C), temperature source Oral, resp. rate (!) 31, height 5\' 11"  (1.803 m), weight (!) 165.6 kg, SpO2 95 %.  PHYSICAL EXAMINATION:   Physical Exam  GENERAL:  25 y.o.-year-old patient lying in the bed with no acute distress.  SEVERE OBESITY LUNGS: Normal breath sounds bilaterally, no wheezing, rales, rhonchi. No use of accessory muscles of respiration.  CARDIOVASCULAR: S1, S2 normal. No murmurs, rubs, or gallops.  ABDOMEN: Soft, nontender, nondistended. Bowel sounds present. No organomegaly or mass.  EXTREMITIES: No cyanosis, clubbing or edema b/l.    NEUROLOGIC: non focal  PSYCHIATRIC:  patient is alert and oriented x 3.  SKIN: No obvious rash, lesion, or ulcer.   LABORATORY PANEL:   CBC Recent Labs  Lab 06/28/21 0704  WBC 8.9  HGB 14.3  HCT 41.6  PLT 267    Chemistries  Recent Labs  Lab 06/28/21 0704  NA 136  K 3.9  CL 101  CO2 23  GLUCOSE 227*  BUN 6  CREATININE 0.86  CALCIUM 8.8*  AST 58*  ALT 59*  ALKPHOS 84  BILITOT 1.5*   Cardiac Enzymes No results for input(s): TROPONINI in the last 168 hours. RADIOLOGY:  DG Chest 2 View  Result Date: 06/27/2021 CLINICAL DATA:  Chest pain times 3-4 days. EXAM: CHEST - 2 VIEW COMPARISON:  None. FINDINGS: The cardiac silhouette is markedly enlarged. Both lungs are clear. The visualized skeletal structures are unremarkable. IMPRESSION: Cardiomegaly without an acute infiltrate. Sequelae associated with a large pericardial effusion cannot be excluded. Correlation with chest CT is recommended if this is of clinical concern. Electronically Signed   By: 06/29/2021 M.D.   On: 06/27/2021 19:11   CT Angio Chest PE W and/or Wo Contrast  Result Date: 06/27/2021 CLINICAL DATA:  Chest pain which began 3-4 days ago, since worsening EXAM: CT ANGIOGRAPHY CHEST WITH CONTRAST TECHNIQUE: Multidetector CT imaging of the chest was performed using the standard protocol during bolus administration of intravenous contrast. Multiplanar CT image reconstructions and MIPs were obtained to evaluate the vascular anatomy. CONTRAST:  06/29/2021 OMNIPAQUE IOHEXOL 350 MG/ML SOLN COMPARISON:  Radiograph 06/27/2021 FINDINGS: Cardiovascular: Facet opacification of pulmonary arteries seen extending from the right lower lobar pulmonary artery into the segmental branches of the lower lobe as well as in right upper lobar artery. Relative  sparing of the pulmonary arteries of the right middle lobe. No visible filling defects are seen to the segmental level within the left lung. Central pulmonary arteries are normal caliber. While the RV-LV ratio is preserved, underlying cardiomegaly limits the clinical utility of this finding. There is evidence of reflux of  contrast into the IVC. No other major venous abnormalities. Exam is not tailored for evaluation of the aorta. No gross acute aortic abnormality is evident. Normal branching of the proximal great vessels. No major venous abnormalities. Mediastinum/Nodes: No mediastinal fluid or gas. Normal thyroid gland and thoracic inlet. No acute abnormality of the trachea or esophagus. No worrisome mediastinal, hilar or axillary adenopathy. Lungs/Pleura: Wedge-shaped regions of peripheral ground-glass are seen in the lateral basal and posterior basal segments of the right lower lobe with a small right pleural effusion. Some additional mild interlobular septal thickening is noted diffusely throughout the lungs on a background of more diffuse hypoventilatory change. No pneumothorax is seen. No concerning pulmonary nodules or masses. Upper Abdomen: Hepatomegaly. No acute abnormalities present in the visualized portions of the upper abdomen. Musculoskeletal: No acute osseous abnormality or suspicious osseous lesion. Review of the MIP images confirms the above findings. IMPRESSION: 1. Pulmonary artery emboli extending from the lobar arteries of the right upper and lower lobe throughout their segmental branches. Wedge-shaped areas of peripheral ground-glass opacity in the lateral basal and posterior basal segment right lower lobe concerning for developing pulmonary infarct with some trace associated effusion. Interlobular septal thickening, can reflect some mild pulmonary edema as well. 2. While the RV-LV ratio is preserved (0.74) there is underlying cardiomegaly which may limit the clinical utility of this finding. Reflux of contrast into the IVC could suggest elevated right heart pressures and supporting evidence of right heart strain. Consider further interrogation with echocardiography. Critical Value/emergent results were called by telephone at the time of interpretation on 06/27/2021 at 8:55 pm to provider [facility] , who  verbally acknowledged these results. Electronically Signed   By: Kreg Shropshire M.D.   On: 06/27/2021 20:53   PERIPHERAL VASCULAR CATHETERIZATION  Result Date: 06/28/2021 See surgical note for result.  ECHOCARDIOGRAM COMPLETE  Result Date: 06/28/2021    ECHOCARDIOGRAM REPORT   Patient Name:   Robert Mccann Date of Exam: 06/28/2021 Medical Rec #:  [id number]        Height:       71.0 in Accession #:    [id number]       Weight:       365.0 lb Date of Birth:  06/09/1996       BSA:          2.723 m Patient Age:    24 years         BP:           124/88 mmHg Patient Gender: M                HR:           99 bpm. Exam Location:  [facility] Procedure: 2D Echo, Color Doppler, Cardiac Doppler, Strain Analysis and            Intracardiac Opacification Agent Indications:     I26.09 Pulmonary Embolus  History:         Patient has no prior history of Echocardiogram examinations.                  Risk Factors:Hypertension and Diabetes.  Sonographer:     Humphrey Rolls RDCS (AE) Referring Phys:  [id number] Sondra Barges Diagnosing Phys: Debbe Odea MD  Sonographer Comments: Suboptimal apical window and no subcostal window. Image acquisition challenging due to patient body habitus. Global longitudinal strain was attempted. IMPRESSIONS  1. 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 Recommend CMR for better analysis.. Left ventricular ejection fraction, by estimation, is <20%. The left ventricle has severely decreased function. The left ventricle demonstrates global hypokinesis. The left ventricular internal cavity size was severely dilated. Left ventricular diastolic parameters are consistent with Grade II diastolic dysfunction (pseudonormalization).  2. Right ventricular systolic function is severely reduced. The right ventricular size is not well visualized. There is normal pulmonary artery systolic pressure.  3. Right atrial size was  mildly dilated.  4. The mitral valve is normal in structure. Mild mitral valve regurgitation.  5. The aortic valve is normal in structure. Aortic valve regurgitation is not visualized.  6. The inferior vena cava is dilated in size with <50% respiratory variability, suggesting right atrial pressure of 15 mmHg. 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 Recommend CMR for better analysis. Left ventricular ejection fraction, by estimation, is <20%. The  left ventricle has severely decreased function. The left ventricle demonstrates global hypokinesis. Definity contrast agent was given IV to delineate the left ventricular endocardial borders. Global longitudinal strain performed but not reported based on interpreter judgement due to suboptimal tracking. The left ventricular internal cavity size was severely dilated. There is no left ventricular hypertrophy. Left ventricular diastolic parameters are consistent with Grade II diastolic dysfunction (pseudonormalization). Right Ventricle: The right ventricular size is not well visualized. No increase in right ventricular wall thickness. Right ventricular systolic function is severely reduced. There is normal pulmonary artery systolic pressure. The tricuspid regurgitant velocity is 2.16 m/s, and with an assumed right atrial pressure of 15 mmHg, the estimated right ventricular systolic pressure is 33.7 mmHg. Left Atrium: Left atrial size was normal in size. Right Atrium: Right atrial size was mildly dilated. Pericardium: There is no evidence of pericardial effusion. Mitral Valve: The mitral valve is normal in structure. Mild mitral valve regurgitation. MV peak gradient, 3.2 mmHg. The mean mitral valve gradient is 1.0 mmHg. Tricuspid Valve: The tricuspid valve is normal in structure. Tricuspid valve regurgitation is mild. Aortic Valve: The aortic valve is  normal in structure. Aortic valve regurgitation is not visualized. Aortic valve mean gradient measures 2.0 mmHg. Aortic valve peak gradient measures 2.6 mmHg. Aortic valve area, by VTI measures 2.19 cm. Pulmonic Valve: The pulmonic valve was normal in structure. Pulmonic valve regurgitation is not visualized. Aorta: The aortic root is normal in size and structure. Venous: The inferior vena cava is dilated in size with less than 50% respiratory variability, suggesting right atrial pressure of 15 mmHg. IAS/Shunts: No atrial level shunt detected by color flow Doppler.  LEFT VENTRICLE PLAX 2D LVIDd:         6.80 cm  Diastology LVIDs:         5.80 cm  LV e' lateral:   5.44 cm/s LV PW:         1.40 cm  LV E/e' lateral: 16.3 LV IVS:        0.90 cm LVOT diam:     2.00 cm LV SV:         27 LV SV Index:   10 LVOT Area:  3.14 cm  LEFT ATRIUM             Index LA diam:        4.50 cm 1.65 cm/m LA Vol (A2C):   67.4 ml 24.75 ml/m LA Vol (A4C):   86.9 ml 31.92 ml/m LA Biplane Vol: 83.4 ml 30.63 ml/m  AORTIC VALVE                   PULMONIC VALVE AV Area (Vmax):    2.49 cm    PV Vmax:       0.50 m/s AV Area (Vmean):   2.39 cm    PV Vmean:      35.400 cm/s AV Area (VTI):     2.19 cm    PV VTI:        0.078 m AV Vmax:           80.80 cm/s  PV Peak grad:  1.0 mmHg AV Vmean:          58.300 cm/s PV Mean grad:  1.0 mmHg AV VTI:            0.122 m AV Peak Grad:      2.6 mmHg AV Mean Grad:      2.0 mmHg LVOT Vmax:         64.00 cm/s LVOT Vmean:        44.300 cm/s LVOT VTI:          0.085 m LVOT/AV VTI ratio: 0.70  AORTA Ao Root diam: 2.70 cm MITRAL VALVE               TRICUSPID VALVE MV Area (PHT): 6.07 cm    TR Peak grad:   18.7 mmHg MV Area VTI:   2.04 cm    TR Vmax:        216.00 cm/s MV Peak grad:  3.2 mmHg MV Mean grad:  1.0 mmHg    SHUNTS MV Vmax:       0.89 m/s    Systemic VTI:  0.09 m MV Vmean:      51.9 cm/s   Systemic Diam: 2.00 cm MV Decel Time: 125 msec MV E velocity: 88.57 cm/s Debbe OdeaBrian Agbor-Etang MD  Electronically signed by Debbe OdeaBrian Agbor-Etang MD Signature Date/Time: 06/28/2021/12:01:20 PM    Final    ASSESSMENT AND PLAN:  Robert EstimableWanya Schoonmaker is a 25 y.o. male seen in ed with complaints of seen in the emergency room with complaints of substernal chest pain, ache, pressure, associated with shortness of breath worsened by exertion, nonproductive cough, some nausea and vomiting.  # Right Pulmonary embolism acute -- patient seen by vascular surgery Dr. Wyn Quakerew. Since it was difficult to discern right heart strain given his massively dilated heart patient underwent mechanical thrombectomy. -- IV heparin drip -- ultrasound venous Doppler bilateral lower extremity given [place]  #acute systolic congestive heart failure-- new onset severe cardiomyopathy EF of less than 20% on echo -- elevated BNP 740 -- IV Lasix -- seen by [facility] cardiology. Recommends Coreg, Enteresto -- catheterization recommended -- check urine drug screen  #morbid obesity with suspected sleep apnea -- patient advised dietary restrictions and get formal sleep study as outpatient  #Hypertension -- on Coreg -- PRN hydralazine  #type II diabetes with cardiovascular disease/cardiomyopathy -- patient has history of diabetes however do not take any meds due to insurance reason -- start on Lantus, SSI -- hold metformin-- cardiac workup and plans  dietitian to see patient  Procedures: Family communication : father in the room Consults : vascular, cardiology CODE STATUS: full DVT Prophylaxis : IV heparin drip Level of care: Progressive Cardiac Status is: Inpatient  Remains inpatient appropriate because:Inpatient level of care appropriate due to severity of illness  Dispo: The patient is from: Home              Anticipated d/c is to: Home              Patient currently is not medically stable to d/c.   Difficult to place patient No   patient diagnosed with pulmonary embolism and new onset severe  cardiomyopathy. Will need to be hospitalized for few days for further cardiac workup     TOTAL TIME TAKING CARE OF THIS PATIENT: 40 minutes.  >50% time spent on counselling and coordination of care  Note: This dictation was prepared with Dragon dictation along with smaller phrase technology. Any transcriptional errors that result from this process are unintentional.  Enedina Finner M.D    [facility]   CC: Primary care physician; Patient, No Pcp Per (Inactive) Patient ID: Robert Mccann, male   DOB: [birth year], 25 y.o.   MRN: [id number]

## 2021-06-28 NOTE — ED Notes (Signed)
Recliner provided. Urinal provided. Patient notified of shift change for RNs. Patient denies further needs. Cardiac monitoring remains in place.

## 2021-06-28 NOTE — Consult Note (Signed)
[facility] Vascular Consult Note  MRN : [id number]  Robert Mccann is a 25 y.o. (January 01, 1996) male who presents with chief complaint of  Chief Complaint  Patient presents with   Chest Pain  .  History of Present Illness: I am asked to see the patient by Dr. Katrinka Blazing in the emergency department for significant pulmonary embolus with markedly dilated heart concerning for heart strain.  There is likely some underlying cardiomyopathy as well.  Patient presents with chest pain, shortness of breath, and tachycardia.  He has undergone a CT scan of the chest which I have independently reviewed.  This is fairly poor quality due to his size, but there appears to be a significant burden of thrombus in the right pulmonary arteries.  No obvious thrombus was seen on the left side, but this was a limited scan.  His heart was markedly dilated.  He has been started on heparin with some mild improvement in his symptoms.  No previous history of DVT or superficial thrombophlebitis to his knowledge.  Current Facility-Administered Medications  Medication Dose Route Frequency Provider Last Rate Last Admin   [MAR Hold] 0.9 %  sodium chloride infusion  250 mL Intravenous PRN Wyn Quaker, Marlow Baars, MD       [MAR Hold] acetaminophen (TYLENOL) tablet 650 mg  650 mg Oral Q6H PRN Annice Needy, MD       Or   Mitzi Hansen Hold] acetaminophen (TYLENOL) suppository 650 mg  650 mg Rectal Q6H PRN Annice Needy, MD       fentaNYL (SUBLIMAZE) 100 MCG/2ML injection            [MAR Hold] furosemide (LASIX) injection 40 mg  40 mg Intravenous BID Annice Needy, MD   40 mg at 06/28/21 1111   heparin ADULT infusion 100 units/mL (25000 units/237mL)  2,000 Units/hr Intravenous Continuous Annice Needy, MD   Stopped at 06/28/21 1306   [MAR Hold] insulin aspart (novoLOG) injection 0-15 Units  0-15 Units Subcutaneous TID WC Annice Needy, MD   5 Units at 06/28/21 0742   midazolam (VERSED) 5 MG/5ML injection            [MAR Hold]  morphine 2 MG/ML injection 2 mg  2 mg Intravenous Q3H PRN Annice Needy, MD   2 mg at 06/28/21 0353   [MAR Hold] sodium chloride flush (NS) 0.9 % injection 3 mL  3 mL Intravenous Q12H Annice Needy, MD       [facility] Hold] sodium chloride flush (NS) 0.9 % injection 3 mL  3 mL Intravenous PRN Wyn Quaker, Marlow Baars, MD        Past Medical History:  Diagnosis Date   Diabetes mellitus without complication (HCC)    Hypertension     Past Surgical History:  Procedure Laterality Date   HAND SURGERY Left age 62   HAND SURGERY       Social History   Tobacco Use   Smoking status: Never   Smokeless tobacco: Never  Vaping Use   Vaping Use: Never used  Substance Use Topics   Alcohol use: Never   Drug use: Yes    Types: Marijuana    Comment: 6 days ago     Family History  Problem Relation Age of Onset   Heart failure Mother    Diabetes Father    Hypertension Father     No Known Allergies   REVIEW OF SYSTEMS (Negative unless checked)  Constitutional: [] Weight loss  [] Fever  []   Chills Cardiac: [x] Chest pain   [] Chest pressure   [] Palpitations   [] Shortness of breath when laying flat   [x] Shortness of breath at rest   [x] Shortness of breath with exertion. Vascular:  [] Pain in legs with walking   [] Pain in legs at rest   [] Pain in legs when laying flat   [] Claudication   [] Pain in feet when walking  [] Pain in feet at rest  [] Pain in feet when laying flat   [x] History of DVT   [x] Phlebitis   [x] Swelling in legs   [] Varicose veins   [] Non-healing ulcers Pulmonary:   [] Uses home oxygen   [] Productive cough   [] Hemoptysis   [] Wheeze  [] COPD   [] Asthma Neurologic:  [] Dizziness  [] Blackouts   [] Seizures   [] History of stroke   [] History of TIA  [] Aphasia   [] Temporary blindness   [] Dysphagia   [] Weakness or numbness in arms   [] Weakness or numbness in legs Musculoskeletal:  [] Arthritis   [] Joint swelling   [] Joint pain   [] Low back pain Hematologic:  [] Easy bruising  [] Easy bleeding   [] Hypercoagulable  state   [] Anemic  [] Hepatitis Gastrointestinal:  [] Blood in stool   [] Vomiting blood  [] Gastroesophageal reflux/heartburn   [] Difficulty swallowing. Genitourinary:  [] Chronic kidney disease   [] Difficult urination  [] Frequent urination  [] Burning with urination   [] Blood in urine Skin:  [] Rashes   [] Ulcers   [] Wounds Psychological:  [] History of anxiety   []  History of major depression.  Physical Examination  Vitals:   06/28/21 1330 06/28/21 1335 06/28/21 1340 06/28/21 1355  BP:    (!) 135/99  Pulse:    (!) 103  Resp:    (!) 23  Temp:      TempSrc:      SpO2: 95% 96% 98% 97%  Weight:      Height:       Body mass index is 50.91 kg/m. Gen:  WD/WN, NAD.  Morbidly obese Head: [place]/AT, No temporalis wasting.  Ear/Nose/Throat: Hearing grossly intact, nares w/o erythema or drainage, oropharynx w/o Erythema/Exudate Eyes: Sclera non-icteric, conjunctiva clear Neck: Trachea midline.  No JVD.  Pulmonary:  Good air movement, respirations mildly labored at rest Cardiac: Tachycardic Vascular:  Vessel Right Left  Radial Palpable Palpable   Musculoskeletal: M/S 5/5 throughout.  Extremities without ischemic changes.  No deformity or atrophy.  Mild lower extremity edema. Neurologic: Sensation grossly intact in extremities.  Symmetrical.  Speech is fluent. Motor exam as listed above. Psychiatric: Judgment intact, Mood & affect appropriate for pt's clinical situation. Dermatologic: No rashes or ulcers noted.  No cellulitis or open wounds.      CBC Lab Results  Component Value Date   WBC 8.9 06/28/2021   HGB 14.3 06/28/2021   HCT 41.6 06/28/2021   MCV 89.3 06/28/2021   PLT 267 06/28/2021    BMET    Component Value Date/Time   NA 136 06/28/2021 0704   K 3.9 06/28/2021 0704   CL 101 06/28/2021 0704   CO2 23 06/28/2021 0704   GLUCOSE 227 (H) 06/28/2021 0704   BUN 6 06/28/2021 0704   CREATININE 0.86 06/28/2021 0704   CALCIUM 8.8 (L) 06/28/2021 0704   GFRNONAA >60 06/28/2021 0704    Estimated Creatinine Clearance: 205.1 mL/min (by C-G formula based on SCr of 0.86 mg/dL).  COAG Lab Results  Component Value Date   INR 1.1 06/27/2021    Radiology DG Chest 2 View  Result Date: 06/27/2021 CLINICAL DATA:  Chest pain times 3-4 days. EXAM: CHEST -  2 VIEW COMPARISON:  None. FINDINGS: The cardiac silhouette is markedly enlarged. Both lungs are clear. The visualized skeletal structures are unremarkable. IMPRESSION: Cardiomegaly without an acute infiltrate. Sequelae associated with a large pericardial effusion cannot be excluded. Correlation with chest CT is recommended if this is of clinical concern. Electronically Signed   By: Aram Candela M.D.   On: 06/27/2021 19:11   CT Angio Chest PE W and/or Wo Contrast  Result Date: 06/27/2021 CLINICAL DATA:  Chest pain which began 3-4 days ago, since worsening EXAM: CT ANGIOGRAPHY CHEST WITH CONTRAST TECHNIQUE: Multidetector CT imaging of the chest was performed using the standard protocol during bolus administration of intravenous contrast. Multiplanar CT image reconstructions and MIPs were obtained to evaluate the vascular anatomy. CONTRAST:  OMNIPAQUE IOHEXOL 350 MG/ML SOLN COMPARISON:  Radiograph 06/27/2021 FINDINGS: Cardiovascular: Facet opacification of pulmonary arteries seen extending from the right lower lobar pulmonary artery into the segmental branches of the lower lobe as well as in right upper lobar artery. Relative sparing of the pulmonary arteries of the right middle lobe. No visible filling defects are seen to the segmental level within the left lung. Central pulmonary arteries are normal caliber. While the RV-LV ratio is preserved, underlying cardiomegaly limits the clinical utility of this finding. There is evidence of reflux of contrast into the IVC. No other major venous abnormalities. Exam is not tailored for evaluation of the aorta. No gross acute aortic abnormality is evident. Normal branching of the proximal  great vessels. No major venous abnormalities. Mediastinum/Nodes: No mediastinal fluid or gas. Normal thyroid gland and thoracic inlet. No acute abnormality of the trachea or esophagus. No worrisome mediastinal, hilar or axillary adenopathy. Lungs/Pleura: Wedge-shaped regions of peripheral ground-glass are seen in the lateral basal and posterior basal segments of the right lower lobe with a small right pleural effusion. Some additional mild interlobular septal thickening is noted diffusely throughout the lungs on a background of more diffuse hypoventilatory change. No pneumothorax is seen. No concerning pulmonary nodules or masses. Upper Abdomen: Hepatomegaly. No acute abnormalities present in the visualized portions of the upper abdomen. Musculoskeletal: No acute osseous abnormality or suspicious osseous lesion. Review of the MIP images confirms the above findings. IMPRESSION: 1. Pulmonary artery emboli extending from the lobar arteries of the right upper and lower lobe throughout their segmental branches. Wedge-shaped areas of peripheral ground-glass opacity in the lateral basal and posterior basal segment right lower lobe concerning for developing pulmonary infarct with some trace associated effusion. Interlobular septal thickening, can reflect some mild pulmonary edema as well. 2. While the RV-LV ratio is preserved (0.74) there is underlying cardiomegaly which may limit the clinical utility of this finding. Reflux of contrast into the IVC could suggest elevated right heart pressures and supporting evidence of right heart strain. Consider further interrogation with echocardiography. Critical Value/emergent results were called by telephone at the time of interpretation on 06/27/2021 at 8:55 pm to provider [facility] , who verbally acknowledged these results. Electronically Signed   By: Kreg Shropshire M.D.   On: 06/27/2021 20:53   PERIPHERAL VASCULAR CATHETERIZATION  Result Date: 06/28/2021 See surgical note for  result.  ECHOCARDIOGRAM COMPLETE  Result Date: 06/28/2021    ECHOCARDIOGRAM REPORT   Patient Name:   Robert Mccann Date of Exam: 06/28/2021 Medical Rec #:  [id number]        Height:       71.0 in Accession #:    [id number]       Weight:  365.0 lb Date of Birth:  04/25/1996       BSA:          2.723 m Patient Age:    24 years         BP:           124/88 mmHg Patient Gender: M                HR:           99 bpm. Exam Location:  [facility] Procedure: 2D Echo, Color Doppler, Cardiac Doppler, Strain Analysis and            Intracardiac Opacification Agent Indications:     I26.09 Pulmonary Embolus  History:         Patient has no prior history of Echocardiogram examinations.                  Risk Factors:Hypertension and Diabetes.  Sonographer:     Humphrey Rolls RDCS (AE) Referring Phys:  [id number] Sondra Barges Diagnosing Phys: Debbe Odea MD  Sonographer Comments: Suboptimal apical window and no subcostal window. Image acquisition challenging due to patient body habitus. Global longitudinal strain was attempted. IMPRESSIONS  1. 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 Recommend CMR for better analysis.. Left ventricular ejection fraction, by estimation, is <20%. The left ventricle has severely decreased function. The left ventricle demonstrates global hypokinesis. The left ventricular internal cavity size was severely dilated. Left ventricular diastolic parameters are consistent with Grade II diastolic dysfunction (pseudonormalization).  2. Right ventricular systolic function is severely reduced. The right ventricular size is not well visualized. There is normal pulmonary artery systolic pressure.  3. Right atrial size was mildly dilated.  4. The mitral valve is normal in structure. Mild mitral valve regurgitation.  5. The aortic valve is normal in structure. Aortic valve regurgitation is not visualized.  6. The  inferior vena cava is dilated in size with <50% respiratory variability, suggesting right atrial pressure of 15 mmHg. 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 Recommend CMR for better analysis. Left ventricular ejection fraction, by estimation, is <20%. The  left ventricle has severely decreased function. The left ventricle demonstrates global hypokinesis. Definity contrast agent was given IV to delineate the left ventricular endocardial borders. Global longitudinal strain performed but not reported based on interpreter judgement due to suboptimal tracking. The left ventricular internal cavity size was severely dilated. There is no left ventricular hypertrophy. Left ventricular diastolic parameters are consistent with Grade II diastolic dysfunction (pseudonormalization). Right Ventricle: The right ventricular size is not well visualized. No increase in right ventricular wall thickness. Right ventricular systolic function is severely reduced. There is normal pulmonary artery systolic pressure. The tricuspid regurgitant velocity is 2.16 m/s, and with an assumed right atrial pressure of 15 mmHg, the estimated right ventricular systolic pressure is 33.7 mmHg. Left Atrium: Left atrial size was normal in size. Right Atrium: Right atrial size was mildly dilated. Pericardium: There is no evidence of pericardial effusion. Mitral Valve: The mitral valve is normal in structure. Mild mitral valve regurgitation. MV peak gradient, 3.2 mmHg. The mean mitral valve gradient is 1.0 mmHg. Tricuspid Valve: The tricuspid valve is normal in structure. Tricuspid valve regurgitation is mild. Aortic Valve: The aortic valve is normal in structure. Aortic valve regurgitation is not visualized. Aortic valve mean  gradient measures 2.0 mmHg. Aortic valve peak gradient measures 2.6 mmHg. Aortic valve area, by VTI measures  2.19 cm. Pulmonic Valve: The pulmonic valve was normal in structure. Pulmonic valve regurgitation is not visualized. Aorta: The aortic root is normal in size and structure. Venous: The inferior vena cava is dilated in size with less than 50% respiratory variability, suggesting right atrial pressure of 15 mmHg. IAS/Shunts: No atrial level shunt detected by color flow Doppler.  LEFT VENTRICLE PLAX 2D LVIDd:         6.80 cm  Diastology LVIDs:         5.80 cm  LV e' lateral:   5.44 cm/s LV PW:         1.40 cm  LV E/e' lateral: 16.3 LV IVS:        0.90 cm LVOT diam:     2.00 cm LV SV:         27 LV SV Index:   10 LVOT Area:     3.14 cm  LEFT ATRIUM             Index LA diam:        4.50 cm 1.65 cm/m LA Vol (A2C):   67.4 ml 24.75 ml/m LA Vol (A4C):   86.9 ml 31.92 ml/m LA Biplane Vol: 83.4 ml 30.63 ml/m  AORTIC VALVE                   PULMONIC VALVE AV Area (Vmax):    2.49 cm    PV Vmax:       0.50 m/s AV Area (Vmean):   2.39 cm    PV Vmean:      35.400 cm/s AV Area (VTI):     2.19 cm    PV VTI:        0.078 m AV Vmax:           80.80 cm/s  PV Peak grad:  1.0 mmHg AV Vmean:          58.300 cm/s PV Mean grad:  1.0 mmHg AV VTI:            0.122 m AV Peak Grad:      2.6 mmHg AV Mean Grad:      2.0 mmHg LVOT Vmax:         64.00 cm/s LVOT Vmean:        44.300 cm/s LVOT VTI:          0.085 m LVOT/AV VTI ratio: 0.70  AORTA Ao Root diam: 2.70 cm MITRAL VALVE               TRICUSPID VALVE MV Area (PHT): 6.07 cm    TR Peak grad:   18.7 mmHg MV Area VTI:   2.04 cm    TR Vmax:        216.00 cm/s MV Peak grad:  3.2 mmHg MV Mean grad:  1.0 mmHg    SHUNTS MV Vmax:       0.89 m/s    Systemic VTI:  0.09 m MV Vmean:      51.9 cm/s   Systemic Diam: 2.00 cm MV Decel Time: 125 msec MV E velocity: 88.57 cm/s Debbe Odea MD Electronically signed by Debbe Odea MD Signature Date/Time: 06/28/2021/12:01:20 PM    Final       Assessment/Plan 1.  Significant pulmonary embolus.  Difficult to discern right heart strain given  his massively dilated heart.  Given his young age, thrombectomy is likely  to be of benefit to reduce the strain on his heart and improve his cardiopulmonary status.  Risks and benefits were discussed.  To be performed today 2.  Morbid obesity.  This certainly represents an increased thrombotic risk 3.  Hypertension.  Stable on outpatient medications and blood pressure control important in reducing the progression of atherosclerotic disease. On appropriate oral medications. 4.  Diabetes. Stable on outpatient medication and blood glucose control important in reducing the progression of atherosclerotic disease. Also, involved in wound healing. On appropriate medications.    Festus BarrenJason Eugen Jeansonne, MD  06/28/2021 2:04 PM    This note was created with Dragon medical transcription system.  Any error is purely unintentional

## 2021-06-28 NOTE — ED Notes (Signed)
Patient lying on right side, resting comfortably. Patient denies needs at this time.

## 2021-06-28 NOTE — Interval H&P Note (Signed)
History and Physical Interval Note:  06/28/2021 12:45 PM  Robert Mccann  has presented today for surgery, with the diagnosis of pulmonary embolus.  The various methods of treatment have been discussed with the patient and family. After consideration of risks, benefits and other options for treatment, the patient has consented to  Procedure(s): PULMONARY THROMBECTOMY (N/A) as a surgical intervention.  The patient's history has been reviewed, patient examined, no change in status, stable for surgery.  I have reviewed the patient's chart and labs.  Questions were answered to the patient's satisfaction.     Festus Barren

## 2021-06-28 NOTE — ED Notes (Signed)
All labs ordered by patient provider sent by phlebotomy at this time.  Dr. Allena Katz at bedside.  Patient provided meal tray, pending NPO at midnight. Patient aware of NPO at midnight order.  Mother and patient updated on POC, and potential wait for inpatient bed.

## 2021-06-28 NOTE — ED Notes (Signed)
Patient is resting comfortably. Patient denies needs at this time. Patient remains on cardiac monitor. Patient call light in reach. Patient remote in reach. Patient lights dimmed for comfort at his request.

## 2021-06-28 NOTE — Op Note (Signed)
[facility]  Percutaneous Study/Intervention Procedural Note   Date of Surgery: 06/28/2021,1:57 PM  Surgeon: Festus Barren  Pre-operative Diagnosis: Symptomatic pulmonary emboli  Post-operative diagnosis:  Same  Procedure(s) Performed:  1.  Contrast injection right heart  2.  Thrombolysis 6 mg of tPA to the right pulmonary arteries  3.  Mechanical thrombectomy right lower lobe, right middle lobe, and right upper lobe pulmonary arteries using the penumbra CAT 8 device  4.  Selective catheter placement right lower lobe, middle lobe, and upper lobe pulmonary arteries  5.  Selective catheter placement left main pulmonary artery    Anesthesia: Conscious sedation was administered under my direct supervision by the interventional radiology RN. IV Versed plus fentanyl were utilized. Continuous ECG, pulse oximetry and blood pressure was monitored throughout the entire procedure.  Versed and fentanyl were administered intravenously.  Conscious sedation was administered for a total of 32 minutes using 2 mg of Versed and 50 mcg of Fentanyl.  EBL: 400 cc  Sheath: 9 French right femoral vein  Contrast: 40 cc   Fluoroscopy Time: 8 minutes  Indications:  Patient presents with pulmonary emboli. The patient is symptomatic with hypoxemia and dyspnea on exertion.  There is evidence of right heart strain on the CT angiogram. The patient is otherwise a good candidate for intervention and even the long-term benefits pulmonary angiography with thrombolysis is offered. The risks and benefits are reviewed long-term benefits are discussed. All questions are answered patient agrees to proceed.  Procedure:  Robert Harringtonis a 25 y.o. male who was identified and appropriate procedural time out was performed.  The patient was then placed supine on the table and prepped and draped in the usual sterile fashion.  Ultrasound was used to evaluate the right common femoral vein.  It was patent, as it  was echolucent and compressible.  A digital ultrasound image was acquired for the permanent record.  A Seldinger needle was used to access the right common femoral vein under direct ultrasound guidance.  A 0.035 J wire was advanced without resistance and a 5Fr sheath was placed and then upsized to an 9 [place] sheath.    The wire and pigtail catheter were then negotiated into the right atrium and bolus injection of contrast was utilized to demonstrate the right ventricle and the pulmonary artery outflow which were markedly dilated. The wire and catheter were then negotiated into the left main pulmonary artery where hand injection of contrast was utilized.  This was done as the CT was of low quality due to his large size.  No significant pulmonary embolus was seen on the left.  Then negotiated the JR4 catheter to the right side and perform selective imaging of the right lower lobe, right middle lobe, and right upper lobe pulmonary arteries.  Thrombus was seen in all 3 lobes.   TPA was reconstituted and delivered onto the table. A total of 6 milligrams of TPA was utilized.  No tPA was administered on the left side and 6 mg of tPA was administered on the right side. This was then allowed to dwell.  The Penumbra Cat 8 catheter was then advanced up into the pulmonary vasculature. The right lung was addressed first. Catheter was negotiated into the right lower lobe and mechanical thrombectomy was performed using the catheter and the separator. Follow-up imaging demonstrated a good result and therefore the catheter was renegotiated into the right middle lobe pulmonary artery and again mechanical thrombectomy was performed. Passes were made with both  the Penumbra catheter itself as well as introducing the separator. Follow-up imaging was then performed.  Again, there is reduction in thrombus burden.  I was able to get the catheter to the origin of the right upper lobe and perform thrombectomy in this location as well.   Completion imaging showed significant improvement Cylex to terminate the procedure.  After review these images wires were reintroduced and the catheters removed. Then, the sheath is then pulled and pressures held. A safeguard is placed.    Findings:   Right heart imaging:  Right atrium and right ventricle and the pulmonary outflow tract appears markedly dilated  Right lung: Thrombus in the right lower lobe, right middle lobe, lobe pulmonary arteries  Left lung: No thrombus was seen in the left pulmonary arteries.    Disposition: Patient was taken to the recovery room in stable condition having tolerated the procedure well.  Festus Barren 06/28/2021,1:57 PM

## 2021-06-28 NOTE — ED Notes (Signed)
Patient ambulatory to restroom. Primofit encouraged r/t heparin use and admitting dx, but patient insisted on ambulating to restroom for BM. Patient returned to room. Patient requested to sit on the side of the bed. Patient remains on cardiac monitoring, Heparin continues to infuse.

## 2021-06-28 NOTE — Consult Note (Signed)
Cardiology Consultation:   Patient ID: Robert Mccann MRN: [id number]; DOB: March 07, 1996  Admit date: 06/27/2021 Date of Consult: 06/28/2021  PCP:  Patient, No Pcp Per (Inactive)   [facility] Providers Cardiologist:  New, Dr. Azucena Cecil rounding    Patient Profile:   Robert Mccann is a 25 y.o. male with a hx of hypertension, diabetes, obesity who is being seen 06/28/2021 for the evaluation of shortness of breath at the request of Dr. Allena Katz.  History of Present Illness:   Robert Mccann is a 25 year old male with history of hypertension, diabetes, obesity presenting with symptoms of shortness of breath with exertion over the past 2 weeks.  Symptoms seems to have gotten worse over the last 4 days, prompting him to come to the emergency room yesterday.  He denies chest pain with exertion, but states coughing causes a sharp chest discomfort.  Denies any personal history of heart disease, or blood clots but endorses having fluid around his lungs at age 38, he was not told why.  His mother has a history of congestive heart failure, has an ICD in place.  In the ED, EKG showed sinus tachycardia, troponins were 38, 35.  Chest CT showed right upper lobe and right lower pulmonary embolus, possible pulmonary infarct.  Patient was started on heparin drip, vascular surgery consulted with plan for thrombectomy.    Has some chest discomfort with laying flat, improved with sitting upright.   Past Medical History:  Diagnosis Date   Diabetes mellitus without complication (HCC)    Hypertension     Past Surgical History:  Procedure Laterality Date   HAND SURGERY Left age 58   HAND SURGERY       Home Medications:  Prior to Admission medications   Medication Sig Start Date End Date Taking? Authorizing Provider  BIOTIN PO Take 3,000 mg by mouth daily. Chew 1 gummy daily.   Yes [provider]  Multiple Vitamin (ONE-A-DAY MENS PO) Take 1 tablet by mouth daily.   Yes [provider]  hydrochlorothiazide (MICROZIDE) 12.5 MG capsule Take 12.5 mg by mouth daily. Patient not taking: Reported on 06/27/2021 07/22/20   [provider]  metFORMIN (GLUCOPHAGE) 500 MG tablet Take 500 mg by mouth 2 (two) times daily. Patient not taking: Reported on 06/27/2021 07/22/20   [provider]  sulfamethoxazole-trimethoprim (BACTRIM DS,SEPTRA DS) 800-160 MG tablet Take 1 tablet by mouth 2 (two) times daily. Patient not taking: Reported on 06/27/2021 06/30/18   Payton Mccallum, MD    Inpatient Medications: Scheduled Meds:  [MAR Hold] furosemide  40 mg Intravenous BID   [MAR Hold] insulin aspart  0-15 Units Subcutaneous TID WC   [MAR Hold] sodium chloride flush  3 mL Intravenous Q12H   Continuous Infusions:  ceFAZolin     [MAR Hold] sodium chloride     sodium chloride      ceFAZolin (ANCEF) IV     heparin 2,000 Units/hr (06/28/21 0808)   PRN Meds: [IFO Hold] sodium chloride, [MAR Hold] acetaminophen **OR** [MAR Hold] acetaminophen, [MAR Hold]  morphine injection, [MAR Hold] sodium chloride flush  Allergies:   No Known Allergies  Social History:   Social History   Socioeconomic History   Marital status: Single    Spouse name: Not on file   Number of children: Not on file   Years of education: Not on file   Highest education level: Not on file  Occupational History   Not on file  Tobacco Use   Smoking status: Never  Smokeless tobacco: Never  Vaping Use   Vaping Use: Never used  Substance and Sexual Activity   Alcohol use: Never   Drug use: Yes    Types: Marijuana    Comment: 6 days ago   Sexual activity: Not on file  Other Topics Concern   Not on file  Social History Narrative   Not on file   Social Determinants of Health   Financial Resource Strain: Not on file  Food Insecurity: Not on file  Transportation Needs: Not on file  Physical Activity: Not on file  Stress: Not on file  Social Connections: Not on file  Intimate Partner  Violence: Not on file    Family History:    Family History  Problem Relation Age of Onset   Heart failure Mother    Diabetes Father    Hypertension Father      ROS:  Please see the history of present illness.   All other ROS reviewed and negative.     Physical Exam/Data:   Vitals:   06/28/21 1045 06/28/21 1100 06/28/21 1115 06/28/21 1136  BP:   126/75 (!) 138/101  Pulse:  (!) 101 (!) 103 (!) 103  Resp: (!) 34 (!) 22 (!) 21 (!) 29  Temp:    98 F (36.7 C)  TempSrc:    Oral  SpO2:  98% 97% 98%  Weight:      Height:       No intake or output data in the 24 hours ending 06/28/21 1149 Last 3 Weights 06/27/2021 06/30/2018  Weight (lbs) 365 lb 370 lb  Weight (kg) 165.563 kg 167.831 kg     Body mass index is 50.91 kg/m.  General:  Well nourished, well developed, in no acute distress HEENT: normal Lymph: no adenopathy Neck: no JVD Endocrine:  No thryomegaly Vascular: No carotid bruits; FA pulses 2+ bilaterally without bruits  Cardiac:  normal S1, S2; RRR; no murmur  Lungs: Decreased breath sounds at bases, worse on the right Abd: soft, nontender, no hepatomegaly  Ext: Trace edema Musculoskeletal:  No deformities, BUE and BLE strength normal and equal Skin: warm and dry  Neuro:  CNs 2-12 intact, no focal abnormalities noted Psych:  Normal affect   EKG:  The EKG was personally reviewed and demonstrates: Sinus tachycardia Telemetry:  Telemetry was personally reviewed and demonstrates: Sinus rhythm heart rate 99  Relevant CV Studies: Echo 06/28/2021  1. 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  Recommend CMR for better analysis.. Left ventricular ejection fraction, by  estimation, is <20%. The left  ventricle has severely decreased function. The left ventricle demonstrates  global hypokinesis. The left ventricular internal cavity size was severely  dilated. Left ventricular  diastolic parameters are consistent with Grade  II diastolic dysfunction  (pseudonormalization).   2. Right ventricular systolic function is severely reduced. The right  ventricular size is not well visualized. There is normal pulmonary artery  systolic pressure.   3. Right atrial size was mildly dilated.   4. The mitral valve is normal in structure. Mild mitral valve  regurgitation.   5. The aortic valve is normal in structure. Aortic valve regurgitation is  not visualized.   6. The inferior vena cava is dilated in size with <50% respiratory  variability, suggesting right atrial pressure of 15 mmHg.   Laboratory Data:  High Sensitivity Troponin:   Recent Labs  Lab 06/27/21 1853 06/27/21 2053  TROPONINIHS  38* 35*     Chemistry Recent Labs  Lab 06/27/21 1853 06/28/21 0704  NA 136 136  K 3.7 3.9  CL 104 101  CO2 25 23  GLUCOSE 189* 227*  BUN 9 6  CREATININE 0.95 0.86  CALCIUM 8.8* 8.8*  GFRNONAA >60 >60  ANIONGAP 7 12    Recent Labs  Lab 06/28/21 0704  PROT 6.9  ALBUMIN 3.3*  AST 58*  ALT 59*  ALKPHOS 84  BILITOT 1.5*   Hematology Recent Labs  Lab 06/27/21 1853 06/28/21 0704  WBC 8.1 8.9  RBC 4.81 4.66  HGB 14.7 14.3  HCT 43.6 41.6  MCV 90.6 89.3  MCH 30.6 30.7  MCHC 33.7 34.4  RDW 12.2 12.2  PLT 278 267   BNP Recent Labs  Lab 06/27/21 1855  BNP 740.6*    DDimer No results for input(s): DDIMER in the last 168 hours.   Radiology/Studies:  DG Chest 2 View  Result Date: 06/27/2021 CLINICAL DATA:  Chest pain times 3-4 days. EXAM: CHEST - 2 VIEW COMPARISON:  None. FINDINGS: The cardiac silhouette is markedly enlarged. Both lungs are clear. The visualized skeletal structures are unremarkable. IMPRESSION: Cardiomegaly without an acute infiltrate. Sequelae associated with a large pericardial effusion cannot be excluded. Correlation with chest CT is recommended if this is of clinical concern. Electronically Signed   By: Aram Candela M.D.   On:  06/27/2021 19:11   CT Angio Chest PE W and/or Wo Contrast  Result Date: 06/27/2021 CLINICAL DATA:  Chest pain which began 3-4 days ago, since worsening EXAM: CT ANGIOGRAPHY CHEST WITH CONTRAST TECHNIQUE: Multidetector CT imaging of the chest was performed using the standard protocol during bolus administration of intravenous contrast. Multiplanar CT image reconstructions and MIPs were obtained to evaluate the vascular anatomy. CONTRAST:  OMNIPAQUE IOHEXOL 350 MG/ML SOLN COMPARISON:  Radiograph 06/27/2021 FINDINGS: Cardiovascular: Facet opacification of pulmonary arteries seen extending from the right lower lobar pulmonary artery into the segmental branches of the lower lobe as well as in right upper lobar artery. Relative sparing of the pulmonary arteries of the right middle lobe. No visible filling defects are seen to the segmental level within the left lung. Central pulmonary arteries are normal caliber. While the RV-LV ratio is preserved, underlying cardiomegaly limits the clinical utility of this finding. There is evidence of reflux of contrast into the IVC. No other major venous abnormalities. Exam is not tailored for evaluation of the aorta. No gross acute aortic abnormality is evident. Normal branching of the proximal great vessels. No major venous abnormalities. Mediastinum/Nodes: No mediastinal fluid or gas. Normal thyroid gland and thoracic inlet. No acute abnormality of the trachea or esophagus. No worrisome mediastinal, hilar or axillary adenopathy. Lungs/Pleura: Wedge-shaped regions of peripheral ground-glass are seen in the lateral basal and posterior basal segments of the right lower lobe with a small right pleural effusion. Some additional mild interlobular septal thickening is noted diffusely throughout the lungs on a background of more diffuse hypoventilatory change. No pneumothorax is seen. No concerning pulmonary nodules or masses. Upper Abdomen: Hepatomegaly. No acute abnormalities  present in the visualized portions of the upper abdomen. Musculoskeletal: No acute osseous abnormality or suspicious osseous lesion. Review of the MIP images confirms the above findings. IMPRESSION: 1. Pulmonary artery emboli extending from the lobar arteries of the right upper and lower lobe throughout their segmental branches. Wedge-shaped areas of peripheral ground-glass opacity in the lateral basal and posterior basal segment right lower lobe concerning for developing pulmonary infarct  with some trace associated effusion. Interlobular septal thickening, can reflect some mild pulmonary edema as well. 2. While the RV-LV ratio is preserved (0.74) there is underlying cardiomegaly which may limit the clinical utility of this finding. Reflux of contrast into the IVC could suggest elevated right heart pressures and supporting evidence of right heart strain. Consider further interrogation with echocardiography. Critical Value/emergent results were called by telephone at the time of interpretation on 06/27/2021 at 8:55 pm to provider [facility] , who verbally acknowledged these results. Electronically Signed   By: Kreg Shropshire M.D.   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 -Lasix 40 mg IV twice daily -Coreg 3.125 mg twice daily, Entresto 24-26 mg twice daily -Left heart cath to rule out ischemia this admission after vascular procedures. -CMR, CHF clinic as outpatient. -Titrate CHF medications as BP permits.  2.  Pulmonary embolus -On heparin drip -Thrombectomy as per vascular surgery  3.  Hypertension -Coreg, Entresto as above -Stop PTA HCTZ  Total encounter time more than 110 minutes  Greater than 50% was spent in counseling and coordination of care with the patient   Signed, Debbe Odea, MD  06/28/2021 11:49 AM

## 2021-06-28 NOTE — Consult Note (Signed)
Patient seen and situation discussed Full consult to follow Plan pulmonary thrombectomy today

## 2021-06-28 NOTE — Progress Notes (Signed)
Patient is requesting a excuse work note for his hospital stay.so is the mother of the patient. I will relay the message as well to the upcoming morning nurse.

## 2021-06-29 ENCOUNTER — Encounter: Payer: Self-pay | Admitting: Vascular Surgery

## 2021-06-29 ENCOUNTER — Inpatient Hospital Stay: Payer: BC Managed Care – PPO

## 2021-06-29 DIAGNOSIS — E119 Type 2 diabetes mellitus without complications: Secondary | ICD-10-CM

## 2021-06-29 DIAGNOSIS — R0789 Other chest pain: Secondary | ICD-10-CM

## 2021-06-29 DIAGNOSIS — I420 Dilated cardiomyopathy: Secondary | ICD-10-CM

## 2021-06-29 DIAGNOSIS — I2699 Other pulmonary embolism without acute cor pulmonale: Secondary | ICD-10-CM

## 2021-06-29 DIAGNOSIS — E1165 Type 2 diabetes mellitus with hyperglycemia: Secondary | ICD-10-CM

## 2021-06-29 DIAGNOSIS — R0602 Shortness of breath: Secondary | ICD-10-CM

## 2021-06-29 LAB — HOMOCYSTEINE: Homocysteine: 9.8 umol/L (ref 0.0–14.5)

## 2021-06-29 LAB — CARDIOLIPIN ANTIBODIES, IGG, IGM, IGA
Anticardiolipin IgA: <9 APL U/mL (ref 0–11)
Anticardiolipin IgG: <9 GPL U/mL (ref 0–14)
Anticardiolipin IgM: <9 MPL U/mL (ref 0–12)

## 2021-06-29 LAB — CBC
HCT: 41 % (ref 39.0–52.0)
Hemoglobin: 14.2 g/dL (ref 13.0–17.0)
MCH: 30.7 pg (ref 26.0–34.0)
MCHC: 34.6 g/dL (ref 30.0–36.0)
MCV: 88.7 fL (ref 80.0–100.0)
Platelets: 287 10*3/uL (ref 150–400)
RBC: 4.62 MIL/uL (ref 4.22–5.81)
RDW: 12 % (ref 11.5–15.5)
WBC: 8.4 10*3/uL (ref 4.0–10.5)
nRBC: 0 % (ref 0.0–0.2)

## 2021-06-29 LAB — GLUCOSE, CAPILLARY
Glucose-Capillary: 158 mg/dL — ABNORMAL HIGH (ref 70–99)
Glucose-Capillary: 249 mg/dL — ABNORMAL HIGH (ref 70–99)
Glucose-Capillary: 144 mg/dL — ABNORMAL HIGH (ref 70–99)
Glucose-Capillary: 191 mg/dL — ABNORMAL HIGH (ref 70–99)

## 2021-06-29 LAB — HEMOGLOBIN A1C
Hgb A1c MFr Bld: 8.8 % — ABNORMAL HIGH (ref 4.8–5.6)
Mean Plasma Glucose: 206 mg/dL

## 2021-06-29 LAB — HEPARIN LEVEL (UNFRACTIONATED)
Heparin Unfractionated: 0.44 IU/mL (ref 0.30–0.70)
Heparin Unfractionated: 0.48 IU/mL (ref 0.30–0.70)

## 2021-06-29 MED ORDER — HEPARIN BOLUS VIA INFUSION
1500.0000 [IU] | Freq: Once | INTRAVENOUS | Status: AC
  Administered 2021-06-29: 1500 [IU] via INTRAVENOUS
  Filled 2021-06-29: qty 1500

## 2021-06-29 MED ORDER — ONDANSETRON HCL 4 MG/2ML IJ SOLN: 4.0000 mg | INTRAMUSCULAR | Status: DC | PRN

## 2021-06-29 MED ORDER — APIXABAN 5 MG PO TABS
10.0000 mg | ORAL_TABLET | Freq: Two times a day (BID) | ORAL | Status: DC
  Administered 2021-06-29 – 2021-06-30 (×2): 10 mg via ORAL
  Filled 2021-06-29 (×2): qty 2

## 2021-06-29 MED ORDER — APIXABAN 5 MG PO TABS: 5.0000 mg | ORAL_TABLET | Freq: Two times a day (BID) | ORAL | Status: DC

## 2021-06-29 MED ORDER — DAPAGLIFLOZIN PROPANEDIOL 5 MG PO TABS
10.0000 mg | ORAL_TABLET | Freq: Every day | ORAL | Status: DC
  Administered 2021-06-29 – 2021-06-30 (×2): 10 mg via ORAL
  Filled 2021-06-29 (×2): qty 2

## 2021-06-29 NOTE — Plan of Care (Signed)
  Problem: Education: Goal: Knowledge of General Education information will improve Description: Including pain rating scale, medication(s)/side effects and non-pharmacologic comfort measures 06/29/2021 1156 by Ansel Bong, RN Outcome: Progressing 06/29/2021 1156 by Ansel Bong, RN Outcome: Progressing   Problem: Health Behavior/Discharge Planning: Goal: Ability to manage health-related needs will improve 06/29/2021 1156 by Ansel Bong, RN Outcome: Progressing 06/29/2021 1156 by Ansel Bong, RN Outcome: Progressing   Problem: Clinical Measurements: Goal: Ability to maintain clinical measurements within normal limits will improve 06/29/2021 1156 by Ansel Bong, RN Outcome: Progressing 06/29/2021 1156 by Ansel Bong, RN Outcome: Progressing Goal: Will remain free from infection 06/29/2021 1156 by Ansel Bong, RN Outcome: Progressing 06/29/2021 1156 by Ansel Bong, RN Outcome: Progressing Goal: Diagnostic test results will improve 06/29/2021 1156 by Ansel Bong, RN Outcome: Progressing 06/29/2021 1156 by Ansel Bong, RN Outcome: Progressing Goal: Respiratory complications will improve 06/29/2021 1156 by Ansel Bong, RN Outcome: Progressing 06/29/2021 1156 by Ansel Bong, RN Outcome: Progressing Goal: Cardiovascular complication will be avoided 06/29/2021 1156 by Ansel Bong, RN Outcome: Progressing 06/29/2021 1156 by Ansel Bong, RN Outcome: Progressing   Problem: Activity: Goal: Risk for activity intolerance will decrease 06/29/2021 1156 by Ansel Bong, RN Outcome: Progressing 06/29/2021 1156 by Ansel Bong, RN Outcome: Progressing   Problem: Nutrition: Goal: Adequate nutrition will be maintained 06/29/2021 1156 by Ansel Bong, RN Outcome: Progressing 06/29/2021 1156 by Ansel Bong, RN Outcome: Progressing   Problem: Coping: Goal: Level of anxiety will decrease 06/29/2021 1156 by Ansel Bong, RN Outcome:  Progressing 06/29/2021 1156 by Ansel Bong, RN Outcome: Progressing   Problem: Elimination: Goal: Will not experience complications related to bowel motility 06/29/2021 1156 by Ansel Bong, RN Outcome: Progressing 06/29/2021 1156 by Ansel Bong, RN Outcome: Progressing Goal: Will not experience complications related to urinary retention 06/29/2021 1156 by Ansel Bong, RN Outcome: Progressing 06/29/2021 1156 by Ansel Bong, RN Outcome: Progressing   Problem: Pain Managment: Goal: General experience of comfort will improve 06/29/2021 1156 by Ansel Bong, RN Outcome: Progressing 06/29/2021 1156 by Ansel Bong, RN Outcome: Progressing   Problem: Safety: Goal: Ability to remain free from injury will improve 06/29/2021 1156 by Ansel Bong, RN Outcome: Progressing 06/29/2021 1156 by Ansel Bong, RN Outcome: Progressing   Problem: Skin Integrity: Goal: Risk for impaired skin integrity will decrease 06/29/2021 1156 by Ansel Bong, RN Outcome: Progressing 06/29/2021 1156 by Ansel Bong, RN Outcome: Progressing   Problem: Education: Goal: Ability to demonstrate management of disease process will improve 06/29/2021 1156 by Ansel Bong, RN Outcome: Progressing 06/29/2021 1156 by Ansel Bong, RN Outcome: Progressing Goal: Ability to verbalize understanding of medication therapies will improve 06/29/2021 1156 by Ansel Bong, RN Outcome: Progressing 06/29/2021 1156 by Ansel Bong, RN Outcome: Progressing Goal: Individualized Educational Video(s) 06/29/2021 1156 by Ansel Bong, RN Outcome: Progressing 06/29/2021 1156 by Ansel Bong, RN Outcome: Progressing   Problem: Activity: Goal: Capacity to carry out activities will improve 06/29/2021 1156 by Ansel Bong, RN Outcome: Progressing 06/29/2021 1156 by Ansel Bong, RN Outcome: Progressing   Problem: Cardiac: Goal: Ability to achieve and maintain adequate cardiopulmonary perfusion will  improve 06/29/2021 1156 by Ansel Bong, RN Outcome: Progressing 06/29/2021 1156 by Ansel Bong, RN Outcome: Progressing

## 2021-06-29 NOTE — Discharge Instructions (Addendum)
Vascular Surgery Discharge Instructions:  1) You may shower. Please keep your groin clean and dry. 2) Please do not engage in strenuous activity or lifting > than 10 pounds for at least two weeks or until you are cleared at your first post-procedure follow up.   Carbohydrate Counting For People With Diabetes  Foods with carbohydrates make your blood glucose level go up. Learning how to count carbohydrates can help you control your blood glucose levels. First, identify the foods you eat that contain carbohydrates. Then, using the Foods with Carbohydrates chart, determine about how much carbohydrates are in your meals and snacks. Make sure you are eating foods with fiber, protein, and healthy fat along with your carbohydrate foods. Foods with Carbohydrates The following table shows carbohydrate foods that have about 15 grams of carbohydrate each. Using measuring cups, spoons, or a food scale when you first begin learning about carbohydrate counting can help you learn about the portion sizes you typically eat. The following foods have 15 grams carbohydrate each:  Grains 1 slice bread (1 ounce)  1 small tortilla (6-inch size)   large bagel (1 ounce)  1/3 cup pasta or rice (cooked)   hamburger or hot dog bun ( ounce)   cup cooked cereal   to  cup ready-to-eat cereal  2 taco shells (5-inch size) Fruit 1 small fresh fruit ( to 1 cup)   medium banana  17 small grapes (3 ounces)  1 cup melon or berries   cup canned or frozen fruit  2 tablespoons dried fruit (blueberries, cherries, cranberries, raisins)   cup unsweetened fruit juice  Starchy Vegetables  cup cooked beans, peas, corn, potatoes/sweet potatoes   large baked potato (3 ounces)  1 cup acorn or butternut squash  Snack Foods 3 to 6 crackers  8 potato chips or 13 tortilla chips ( ounce to 1 ounce)  3 cups popped popcorn  Dairy 3/4 cup (6 ounces) nonfat plain yogurt, or yogurt with sugar-free sweetener  1 cup milk  1 cup  plain rice, soy, coconut or flavored almond milk Sweets and Desserts  cup ice cream or frozen yogurt  1 tablespoon jam, jelly, pancake syrup, table sugar, or honey  2 tablespoons light pancake syrup  1 inch square of frosted cake or 2 inch square of unfrosted cake  2 small cookies (2/3 ounce each) or  large cookie  Sometimes you'll have to estimate carbohydrate amounts if you don't know the exact recipe. One cup of mixed foods like soups can have 1 to 2 carbohydrate servings, while some casseroles might have 2 or more servings of carbohydrate. Foods that have less than 20 calories in each serving can be counted as "free" foods. Count 1 cup raw vegetables, or  cup cooked non-starchy vegetables as "free" foods. If you eat 3 or more servings at one meal, then count them as 1 carbohydrate serving.  Foods without Carbohydrates  Not all foods contain carbohydrates. Meat, some dairy, fats, non-starchy vegetables, and many beverages don't contain carbohydrate. So when you count carbohydrates, you can generally exclude chicken, pork, beef, fish, seafood, eggs, tofu, cheese, butter, sour cream, avocado, nuts, seeds, olives, mayonnaise, water, black coffee, unsweetened tea, and zero-calorie drinks. Vegetables with no or low carbohydrate include green beans, cauliflower, tomatoes, and onions. How much carbohydrate should I eat at each meal?  Carbohydrate counting can help you plan your meals and manage your weight. Following are some starting points for carbohydrate intake at each meal. Work with your registered dietitian nutritionist  to find the best range that works for your blood glucose and weight.   To Lose Weight To Maintain Weight  Women 2 - 3 carb servings 3 - 4 carb servings  Men 3 - 4 carb servings 4 - 5 carb servings  Checking your blood glucose after meals will help you know if you need to adjust the timing, type, or number of carbohydrate servings in your meal plan. Achieve and keep a healthy body  weight by balancing your food intake and physical activity.  Tips How should I plan my meals?  Plan for half the food on your plate to include non-starchy vegetables, like salad greens, broccoli, or carrots. Try to eat 3 to 5 servings of non-starchy vegetables every day. Have a protein food at each meal. Protein foods include chicken, fish, meat, eggs, or beans (note that beans contain carbohydrate). These two food groups (non-starchy vegetables and proteins) are low in carbohydrate. If you fill up your plate with these foods, you will eat less carbohydrate but still fill up your stomach. Try to limit your carbohydrate portion to  of the plate.  What fats are healthiest to eat?  Diabetes increases risk for heart disease. To help protect your heart, eat more healthy fats, such as olive oil, nuts, and avocado. Eat less saturated fats like butter, cream, and high-fat meats, like bacon and sausage. Avoid trans fats, which are in all foods that list "partially hydrogenated oil" as an ingredient. What should I drink?  Choose drinks that are not sweetened with sugar. The healthiest choices are water, carbonated or seltzer waters, and tea and coffee without added sugars.  Sweet drinks will make your blood glucose go up very quickly. One serving of soda or energy drink is  cup. It is best to drink these beverages only if your blood glucose is low.  Artificially sweetened, or diet drinks, typically do not increase your blood glucose if they have zero calories in them. Read labels of beverages, as some diet drinks do have carbohydrate and will raise your blood glucose. Label Reading Tips Read Nutrition Facts labels to find out how many grams of carbohydrate are in a food you want to eat. Don't forget: sometimes serving sizes on the label aren't the same as how much food you are going to eat, so you may need to calculate how much carbohydrate is in the food you are serving yourself.   Carbohydrate Counting for  People with Diabetes Sample 1-Day Menu  Breakfast  cup yogurt, low fat, low sugar (1 carbohydrate serving)   cup cereal, ready-to-eat, unsweetened (1 carbohydrate serving)  1 cup strawberries (1 carbohydrate serving)   cup almonds ( carbohydrate serving)  Lunch 1, 5 ounce can chunk light tuna  2 ounces cheese, low fat cheddar  6 whole wheat crackers (1 carbohydrate serving)  1 small apple (1 carbohydrate servings)   cup carrots ( carbohydrate serving)   cup snap peas  1 cup 1% milk (1 carbohydrate serving)   Evening Meal Stir fry made with: 3 ounces chicken  1 cup brown rice (3 carbohydrate servings)   cup broccoli ( carbohydrate serving)   cup green beans   cup onions  1 tablespoon olive oil  2 tablespoons teriyaki sauce ( carbohydrate serving)  Evening Snack 1 extra small banana (1 carbohydrate serving)  1 tablespoon peanut butter   Carbohydrate Counting for People with Diabetes Vegan Sample 1-Day Menu  Breakfast 1 cup cooked oatmeal (2 carbohydrate servings)   cup blueberries (  1 carbohydrate serving)  2 tablespoons flaxseeds  1 cup soymilk fortified with calcium and vitamin D  1 cup coffee  Lunch 2 slices whole wheat bread (2 carbohydrate servings)   cup baked tofu   cup lettuce  2 slices tomato  2 slices avocado   cup baby carrots ( carbohydrate serving)  1 orange (1 carbohydrate serving)  1 cup soymilk fortified with calcium and vitamin D   Evening Meal Burrito made with: 1 6-inch corn tortilla (1 carbohydrate serving)  1 cup refried vegetarian beans (2 carbohydrate servings)   cup chopped tomatoes   cup lettuce   cup salsa  1/3 cup brown rice (1 carbohydrate serving)  1 tablespoon olive oil for rice   cup zucchini   Evening Snack 6 small whole grain crackers (1 carbohydrate serving)  2 apricots ( carbohydrate serving)   cup unsalted peanuts ( carbohydrate serving)    Carbohydrate Counting for People with Diabetes Vegetarian (Lacto-Ovo)  Sample 1-Day Menu  Breakfast 1 cup cooked oatmeal (2 carbohydrate servings)   cup blueberries (1 carbohydrate serving)  2 tablespoons flaxseeds  1 egg  1 cup 1% milk (1 carbohydrate serving)  1 cup coffee  Lunch 2 slices whole wheat bread (2 carbohydrate servings)  2 ounces low-fat cheese   cup lettuce  2 slices tomato  2 slices avocado   cup baby carrots ( carbohydrate serving)  1 orange (1 carbohydrate serving)  1 cup unsweetened tea  Evening Meal Burrito made with: 1 6-inch corn tortilla (1 carbohydrate serving)   cup refried vegetarian beans (1 carbohydrate serving)   cup tomatoes   cup lettuce   cup salsa  1/3 cup brown rice (1 carbohydrate serving)  1 tablespoon olive oil for rice   cup zucchini  1 cup 1% milk (1 carbohydrate serving)  Evening Snack 6 small whole grain crackers (1 carbohydrate serving)  2 apricots ( carbohydrate serving)   cup unsalted peanuts ( carbohydrate serving)    Copyright 2020  Academy [employer]. All rights reserved.  Using Nutrition Labels: Carbohydrate  Serving Size  Look at the serving size. All the information on the label is based on this portion. Servings Per Container  The number of servings contained in the package. Guidelines for Carbohydrate  Look at the total grams of carbohydrate in the serving size.  1 carbohydrate choice = 15 grams of carbohydrate. Range of Carbohydrate Grams Per Choice  Carbohydrate Grams/Choice Carbohydrate Choices  6-10   11-20 1  21-25 1  26-35 2  36-40 2  41-50 3  51-55 3  56-65 4  66-70 4  71-80 5    Copyright 2020  Academy [employer]. All rights reserved.   Low Sodium Nutrition Therapy  Eating less sodium can help you if you have high blood pressure, heart failure, or kidney or liver disease.   Your body needs a little sodium, but too much sodium can cause your body to hold onto extra water. This extra water will raise your blood pressure  and can cause damage to your heart, kidneys, or liver as they are forced to work harder.   Sometimes you can see how the extra fluid affects you because your hands, legs, or belly swell. You may also hold water around your heart and lungs, which makes it hard to breathe.   Even if you take medication for blood pressure or a water pill (diuretic) to remove fluid, it is still important to have less salt in  your diet.   Check with your primary care provider before drinking alcohol since it may affect the amount of fluid in your body and how your heart, kidneys, or liver work. Sodium in Food A low-sodium meal plan limits the sodium that you get from food and beverages to 1,500-2,000 milligrams (mg) per day. Salt is the main source of sodium. Read the nutrition label on the package to find out how much sodium is in one serving of a food.  Select foods with 140 milligrams (mg) of sodium or less per serving.  You may be able to eat one or two servings of foods with a little more than 140 milligrams (mg) of sodium if you are closely watching how much sodium you eat in a day.  Check the serving size on the label. The amount of sodium listed on the label shows the amount in one serving of the food. So, if you eat more than one serving, you will get more sodium than the amount listed.  Tips Cutting Back on Sodium Eat more fresh foods.  Fresh fruits and vegetables are low in sodium, as well as frozen vegetables and fruits that have no added juices or sauces.  Fresh meats are lower in sodium than processed meats, such as bacon, sausage, and hotdogs.  Not all processed foods are unhealthy, but some processed foods may have too much sodium.  Eat less salt at the table and when cooking. One of the ingredients in salt is sodium.  One teaspoon of table salt has 2,300 milligrams of sodium.  Leave the salt out of recipes for pasta, casseroles, and soups. Be a Engineer, building services.  Food packages that say "Salt-free",  sodium-free", "very low sodium," and "low sodium" have less than 140 milligrams of sodium per serving.  Beware of products identified as "Unsalted," "No Salt Added," "Reduced Sodium," or "Lower Sodium." These items may still be high in sodium. You should always check the nutrition label. Add flavors to your food without adding sodium.  Try lemon juice, lime juice, or vinegar.  Dry or fresh herbs add flavor.  Buy a sodium-free seasoning blend or make your own at home. You can purchase salt-free or sodium-free condiments like barbeque sauce in stores and online. Ask your registered dietitian nutritionist for recommendations and where to find them.   Eating in Restaurants Choose foods carefully when you eat outside your home. Restaurant foods can be very high in sodium. Many restaurants provide nutrition facts on their menus or their websites. If you cannot find that information, ask your server. Let your server know that you want your food to be cooked without salt and that you would like your salad dressing and sauces to be served on the side.    Foods Recommended Food Group Foods Recommended  Grains Bread, bagels, rolls without salted tops Homemade bread made with reduced-sodium baking powder Cold cereals, especially shredded wheat and puffed rice Oats, grits, or cream of wheat Pastas, quinoa, and rice Popcorn, pretzels or crackers without salt Corn tortillas  Protein Foods Fresh meats and fish; [place] bacon (check the nutrition labels - make sure they are not packaged in a sodium solution) Canned or packed tuna (no more than 4 ounces at 1 serving) Beans and peas Soybeans) and tofu Eggs Nuts or nut butters without salt  Dairy Milk or milk powder Plant milks, such as rice and soy Yogurt, including Greek yogurt Small amounts of natural cheese (blocks of cheese) or reduced-sodium cheese can be used in  moderation. (Swiss, ricotta, and fresh mozzarella cheese are lower in sodium than the  others) Cream Cheese Low sodium cottage cheese  Vegetables Fresh and frozen vegetables without added sauces or salt Homemade soups (without salt) Low-sodium, salt-free or sodium-free canned vegetables and soups  Fruit Fresh and canned fruits Dried fruits, such as raisins, cranberries, and prunes  Oils Tub or liquid margarine, regular or without salt Canola, corn, peanut, olive, safflower, or sunflower oils  Condiments Fresh or dried herbs such as basil, bay leaf, dill, mustard (dry), nutmeg, paprika, parsley, rosemary, sage, or thyme.  Low sodium ketchup Vinegar  Lemon or lime juice Pepper, red pepper flakes, and cayenne. Hot sauce contains sodium, but if you use just a drop or two, it will not add up to much.  Salt-free or sodium-free seasoning mixes and marinades Simple salad dressings: vinegar and oil   Foods Not Recommended Food Group Foods Not Recommended  Grains Breads or crackers topped with salt Cereals (hot/cold) with more than 300 mg sodium per serving Biscuits, cornbread, and other "quick" breads prepared with baking soda Pre-packaged bread crumbs Seasoned and packaged rice and pasta mixes Self-rising flours  Protein Foods Cured meats: Bacon, ham, sausage, pepperoni and hot dogs Canned meats (chili, vienna sausage, or sardines) Smoked fish and meats Frozen meals that have more than 600 mg of sodium per serving Egg substitute (with added sodium)  Dairy Buttermilk Processed cheese spreads Cottage cheese (1 cup may have over 500 mg of sodium; look for low-sodium.) American or feta cheese Shredded Cheese has more sodium than blocks of cheese String cheese  Vegetables Canned vegetables (unless they are salt-free, sodium-free or low sodium) Frozen vegetables with seasoning and sauces Sauerkraut and pickled vegetables Canned or dried soups (unless they are salt-free, sodium-free, or low sodium) [place] fries and onion rings  Fruit Dried fruits preserved with additives  that have sodium  Oils Salted butter or margarine, all types of olives  Condiments Salt, sea salt, kosher salt, onion salt, and garlic salt Seasoning mixes with salt Bouillon cubes Ketchup Barbeque sauce and Worcestershire sauce unless low sodium Soy sauce Salsa, pickles, olives, relish Salad dressings: ranch, blue cheese, [place], and [place].   Low Sodium Sample 1-Day Menu  Breakfast 1 cup cooked oatmeal  1 slice whole wheat bread toast  1 tablespoon peanut butter without salt  1 banana  1 cup 1% milk  Lunch Tacos made with: 2 corn tortillas   cup black beans, low sodium   cup roasted or grilled chicken (without skin)   avocado  Squeeze of lime juice  1 cup salad greens  1 tablespoon low-sodium salad dressing   cup strawberries  1 orange  Afternoon Snack 1/3 cup grapes  6 ounces yogurt  Evening Meal 3 ounces herb-baked fish  1 baked potato  2 teaspoons olive oil   cup cooked carrots  2 thick slices tomatoes on:  2 lettuce leaves  1 teaspoon olive oil  1 teaspoon balsamic vinegar  1 cup 1% milk  Evening Snack 1 apple   cup almonds without salt   Low-Sodium Vegetarian (Lacto-Ovo) Sample 1-Day Menu  Breakfast 1 cup cooked oatmeal  1 slice whole wheat toast  1 tablespoon peanut butter without salt  1 banana  1 cup 1% milk  Lunch Tacos made with: 2 corn tortillas   cup black beans, low sodium   cup roasted or grilled chicken (without skin)   avocado  Squeeze of lime juice  1 cup salad greens  1 tablespoon  low-sodium salad dressing   cup strawberries  1 orange  Evening Meal Stir fry made with:  cup tofu  1 cup brown rice   cup broccoli   cup green beans   cup peppers   tablespoon peanut oil  1 orange  1 cup 1% milk  Evening Snack 4 strips celery  2 tablespoons hummus  1 hard-boiled egg   Low-Sodium Vegan Sample 1-Day Menu  Breakfast 1 cup cooked oatmeal  1 tablespoon peanut butter without salt  1 cup blueberries  1 cup soymilk fortified  with calcium, vitamin B12, and vitamin D  Lunch 1 small whole wheat pita   cup cooked lentils  2 tablespoons hummus  4 carrot sticks  1 medium apple  1 cup soymilk fortified with calcium, vitamin B12, and vitamin D  Evening Meal Stir fry made with:  cup tofu  1 cup brown rice   cup broccoli   cup green beans   cup peppers   tablespoon peanut oil  1 cup cantaloupe  Evening Snack 1 cup soy yogurt   cup mixed nuts  Copyright 2020  Academy of [employer]etics. All rights reserved  Sodium Free Flavoring Tips  When cooking, the following items may be used for flavoring instead of salt or seasonings that contain sodium. Remember: A little bit of spice goes a long way! Be careful not to overseason. Spice Blend Recipe (makes about ? cup) 5 teaspoons onion powder  2 teaspoons garlic powder  2 teaspoons paprika  2 teaspoon dry mustard  1 teaspoon crushed thyme leaves   teaspoon white pepper   teaspoon celery seed Food Item Flavorings  Beef Basil, bay leaf, caraway, curry, dill, dry mustard, garlic, grape jelly, green pepper, mace, marjoram, mushrooms (fresh), nutmeg, onion or onion powder, parsley, pepper, rosemary, sage  Chicken Basil, cloves, cranberries, mace, mushrooms (fresh), nutmeg, oregano, paprika, parsley, pineapple, saffron, sage, savory, tarragon, thyme, tomato, turmeric  Egg Chervil, curry, dill, dry mustard, garlic or garlic powder, green pepper, jelly, mushrooms (fresh), nutmeg, onion powder, paprika, parsley, rosemary, tarragon, tomato  Fish Basil, bay leaf, chervil, curry, dill, dry mustard, green pepper, lemon juice, marjoram, mushrooms (fresh), paprika, pepper, tarragon, tomato, turmeric  Lamb Cloves, curry, dill, garlic or garlic powder, mace, mint, mint jelly, onion, oregano, parsley, pineapple, rosemary, tarragon, thyme  Pork Applesauce, basil, caraway, chives, cloves, garlic or garlic powder, onion or onion powder, rosemary, thyme  Veal Apricots,  basil, bay leaf, currant jelly, curry, ginger, marjoram, mushrooms (fresh), oregano, paprika  Vegetables Basil, dill, garlic or garlic powder, ginger, lemon juice, mace, marjoram, nutmeg, onion or onion powder, tarragon, tomato, sugar or sugar substitute, salt-free salad dressing, vinegar  Desserts Allspice, anise, cinnamon, cloves, ginger, mace, nutmeg, vanilla extract, other extracts   Copyright 2020  Academy of [employer] and [employer]etics. All rights reserved  Fluid Restricted Nutrition Therapy  You have been prescribed this diet because your condition affects how much fluid you can eat or drink. If your heart, liver, or kidneys aren't working properly, you may not be able to effectively eliminate fluids from the body and this may cause swelling (edema) in the legs, arms, and/or stomach. Drink no more than _________ liters or ________ ounces or ________cups of fluid per day.  You don't need to stop eating or drinking the same fluids you normally would, but you may need to eat or drink less than usual.  Your registered dietitian nutritionist will help you determine the correct amount of fluid to consume during the day  Breakfast Include fluids taken with medications  Lunch Include fluids taken with medications  Dinner Include fluids taken with medications  Bedtime Snack Include fluids taken with medications     Tips What Are Fluids?  A fluid is anything that is liquid or anything that would melt if left at room temperature. You will need to count these foods and liquids--including any liquid used to take medication--as part of your daily fluid intake. Some examples are: Alcohol (drink only with your doctor's permission)  Coffee, tea, and other hot beverages  Gelatin (Jell-O)  Gravy  Ice cream, sherbet, sorbet  Ice cubes, ice chips  Milk, liquid creamer  Nutritional supplements  Popsicles  Vegetable and fruit juices; fluid in canned fruit  Watermelon  Yogurt  Soft drinks, lemonade,  limeade  Soups  Syrup How Do I Measure My Fluid Intake? Record your fluid intake daily.  Tip: Every day, each time you eat or drink fluids, pour water in the same amount into an empty container that can hold the same amount of fluids you are allowed daily. This may help you keep track of how much fluid you are taking in throughout the day.  To accurately keep track of how much liquid you take in, measure the size of the cups, glasses, and bowls you use. If you eat soup, measure how much of it is liquid and how much is solid (such as noodles, vegetables, meat). Conversions for Measuring Fluid Intake  Milliliters (mL) Liters (L) Ounces (oz) Cups (c)  1000 1 32 4  1200 1.2 40 5  1500 1.5 50 6 1/4  1800 1.8 60 7 1/2  2000 2 67 8 1/3  Tips to Reduce Your Thirst Chew gum or suck on hard candy.  Rinse or gargle with mouthwash. Do not swallow.  Ice chips or popsicles my help quench thirst, but this too needs to be calculated into the total restriction. Melt ice chips or cubes first to figure out how much fluid they produce (for example, experiment with melting  cup ice chips or 2 ice cubes).  Add a lemon wedge to your water.  Limit how much salt you take in. A high salt intake might make you thirstier.  Don't eat or drink all your allowed liquids at once. Space your liquids out through the day.  Use small glasses and cups and sip slowly. If allowed, take your medications with fluids you eat or drink during a meal.   Fluid-Restricted Nutrition Therapy Sample 1-Day Menu  Breakfast 1 slice wheat toast  1 tablespoon peanut butter  1/2 cup yogurt (120 milliliters)  1/2 cup blueberries  1 cup milk (240 milliliters)   Lunch 3 ounces sliced [place]  2 slices whole wheat bread  1/2 cup lettuce for sandwich  2 slices tomato for sandwich  1 ounce reduced-fat, reduced-sodium cheese  1/2 cup fresh carrot sticks  1 banana  1 cup unsweetened tea (240 milliliters)   Evening Meal 8 ounces soup (240  milliliters)  3 ounces salmon  1/2 cup quinoa  1 cup green beans  1 cup mixed greens salad  1 tablespoon olive oil  1 cup coffee (240 milliliters)  Evening Snack 1/2 cup sliced peaches  1/2 cup frozen yogurt (120 milliliters)  1 cup water (240 milliliters)  Copyright 2020  Academy [employer]etics. All rights reserved

## 2021-06-29 NOTE — Progress Notes (Signed)
[facility]  - [place] [facility]   PATIENT NAME: Robert Mccann    MR#:  [id number]  DATE OF BIRTH:  1996-10-14  SUBJECTIVE:   overall feels a lot better. Girlfriend and mother in the room. Denies shortness of breath chest pain. Eating well. REVIEW OF SYSTEMS:   Review of Systems  Constitutional:  Negative for chills, fever and weight loss.  HENT:  Negative for ear discharge, ear pain and nosebleeds.   Eyes:  Negative for blurred vision, pain and discharge.  Respiratory:  Negative for sputum production, shortness of breath, wheezing and stridor.   Cardiovascular:  Negative for chest pain, palpitations, orthopnea and PND.  Gastrointestinal:  Negative for abdominal pain, diarrhea, nausea and vomiting.  Genitourinary:  Negative for frequency and urgency.  Musculoskeletal:  Negative for back pain and joint pain.  Neurological:  Negative for sensory change, speech change, focal weakness and weakness.  Psychiatric/Behavioral:  Negative for depression and hallucinations. The patient is not nervous/anxious.   Tolerating Diet: Yes Tolerating PT:   DRUG ALLERGIES:  No Known Allergies  VITALS:  Blood pressure 118/71, pulse 76, temperature 97.7 F (36.5 C), temperature source Oral, resp. rate 18, height 5\' 11"  (1.803 m), weight (!) 165.6 kg, SpO2 100 %.  PHYSICAL EXAMINATION:   Physical Exam  GENERAL:  25 y.o.-year-old patient lying in the bed with no acute distress.  SEVERE OBESITY LUNGS: Normal breath sounds bilaterally, no wheezing, rales, rhonchi. No use of accessory muscles of respiration.  CARDIOVASCULAR: S1, S2 normal. No murmurs, rubs, or gallops.  ABDOMEN: Soft, nontender, nondistended. Bowel sounds present. No organomegaly or mass.  EXTREMITIES: No cyanosis, clubbing or edema b/l.    NEUROLOGIC: non focal  PSYCHIATRIC:  patient is alert and oriented x 3.  SKIN: No obvious rash, lesion, or ulcer.   LABORATORY PANEL:  CBC Recent Labs  Lab  06/29/21 0613  WBC 8.4  HGB 14.2  HCT 41.0  PLT 287     Chemistries  Recent Labs  Lab 06/28/21 0704  NA 136  K 3.9  CL 101  CO2 23  GLUCOSE 227*  BUN 6  CREATININE 0.86  CALCIUM 8.8*  AST 58*  ALT 59*  ALKPHOS 84  BILITOT 1.5*    Cardiac Enzymes No results for input(s): TROPONINI in the last 168 hours. RADIOLOGY:  DG Chest 2 View  Result Date: 06/27/2021 CLINICAL DATA:  Chest pain times 3-4 days. EXAM: CHEST - 2 VIEW COMPARISON:  None. FINDINGS: The cardiac silhouette is markedly enlarged. Both lungs are clear. The visualized skeletal structures are unremarkable. IMPRESSION: Cardiomegaly without an acute infiltrate. Sequelae associated with a large pericardial effusion cannot be excluded. Correlation with chest CT is recommended if this is of clinical concern. Electronically Signed   By: Aram Candelahaddeus  Houston M.D.   On: 06/27/2021 19:11   CT Angio Chest PE W and/or Wo Contrast  Result Date: 06/27/2021 CLINICAL DATA:  Chest pain which began 3-4 days ago, since worsening EXAM: CT ANGIOGRAPHY CHEST WITH CONTRAST TECHNIQUE: Multidetector CT imaging of the chest was performed using the standard protocol during bolus administration of intravenous contrast. Multiplanar CT image reconstructions and MIPs were obtained to evaluate the vascular anatomy. CONTRAST:  100mL OMNIPAQUE IOHEXOL 350 MG/ML SOLN COMPARISON:  Radiograph 06/27/2021 FINDINGS: Cardiovascular: Facet opacification of pulmonary arteries seen extending from the right lower lobar pulmonary artery into the segmental branches of the lower lobe as well as in right upper lobar artery. Relative sparing of the pulmonary arteries of the right  middle lobe. No visible filling defects are seen to the segmental level within the left lung. Central pulmonary arteries are normal caliber. While the RV-LV ratio is preserved, underlying cardiomegaly limits the clinical utility of this finding. There is evidence of reflux of contrast into the IVC.  No other major venous abnormalities. Exam is not tailored for evaluation of the aorta. No gross acute aortic abnormality is evident. Normal branching of the proximal great vessels. No major venous abnormalities. Mediastinum/Nodes: No mediastinal fluid or gas. Normal thyroid gland and thoracic inlet. No acute abnormality of the trachea or esophagus. No worrisome mediastinal, hilar or axillary adenopathy. Lungs/Pleura: Wedge-shaped regions of peripheral ground-glass are seen in the lateral basal and posterior basal segments of the right lower lobe with a small right pleural effusion. Some additional mild interlobular septal thickening is noted diffusely throughout the lungs on a background of more diffuse hypoventilatory change. No pneumothorax is seen. No concerning pulmonary nodules or masses. Upper Abdomen: Hepatomegaly. No acute abnormalities present in the visualized portions of the upper abdomen. Musculoskeletal: No acute osseous abnormality or suspicious osseous lesion. Review of the MIP images confirms the above findings. IMPRESSION: 1. Pulmonary artery emboli extending from the lobar arteries of the right upper and lower lobe throughout their segmental branches. Wedge-shaped areas of peripheral ground-glass opacity in the lateral basal and posterior basal segment right lower lobe concerning for developing pulmonary infarct with some trace associated effusion. Interlobular septal thickening, can reflect some mild pulmonary edema as well. 2. While the RV-LV ratio is preserved (0.74) there is underlying cardiomegaly which may limit the clinical utility of this finding. Reflux of contrast into the IVC could suggest elevated right heart pressures and supporting evidence of right heart strain. Consider further interrogation with echocardiography. Critical Value/emergent results were called by telephone at the time of interpretation on 06/27/2021 at 8:55 pm to provider [facility] , who verbally acknowledged these  results. Electronically Signed   By: Kreg Shropshire M.D.   On: 06/27/2021 20:53   PERIPHERAL VASCULAR CATHETERIZATION  Result Date: 06/28/2021 See surgical note for result.  US Venous Img Lower Bilateral (DVT)  Result Date: 06/29/2021 CLINICAL DATA:  Lower extremity edema and shortness of breath. Pulmonary embolus on recent CT angiogram chest study. EXAM: BILATERAL LOWER EXTREMITY VENOUS DUPLEX ULTRASOUND TECHNIQUE: Gray-scale sonography with graded compression, as well as color Doppler and duplex ultrasound were performed to evaluate the lower extremity deep venous systems from the level of the common femoral vein and including the common femoral, femoral, profunda femoral, popliteal and calf veins including the posterior tibial, peroneal and gastrocnemius veins when visible. The superficial great saphenous vein was also interrogated. Spectral Doppler was utilized to evaluate flow at rest and with distal augmentation maneuvers in the common femoral, femoral and popliteal veins. COMPARISON:  CT angiogram chest June 27, 2021 FINDINGS: RIGHT LOWER EXTREMITY Common Femoral Vein: No evidence of thrombus. Normal compressibility, respiratory phasicity and response to augmentation. Saphenofemoral Junction: No evidence of thrombus. Normal compressibility and flow on color Doppler imaging. Profunda Femoral Vein: No evidence of thrombus. Normal compressibility and flow on color Doppler imaging. Femoral Vein: No evidence of thrombus. Normal compressibility, respiratory phasicity and response to augmentation. Popliteal Vein: No evidence of thrombus. Normal compressibility, respiratory phasicity and response to augmentation. Calf Veins: No evidence of thrombus. Normal compressibility and flow on color Doppler imaging. Superficial Great Saphenous Vein: No evidence of thrombus. Normal compressibility. Venous Reflux:  None. Other Findings:  None. LEFT LOWER EXTREMITY Common Femoral Vein: No  evidence of thrombus. Normal  compressibility, respiratory phasicity and response to augmentation. Saphenofemoral Junction: No evidence of thrombus. Normal compressibility and flow on color Doppler imaging. Profunda Femoral Vein: No evidence of thrombus. Normal compressibility and flow on color Doppler imaging. Femoral Vein: No evidence of thrombus. Normal compressibility, respiratory phasicity and response to augmentation. Popliteal Vein: No evidence of thrombus. Normal compressibility, respiratory phasicity and response to augmentation. Calf Veins: No evidence of thrombus. Normal compressibility and flow on color Doppler imaging. Superficial Great Saphenous Vein: No evidence of thrombus. Normal compressibility. Venous Reflux:  None. Other Findings:  None. IMPRESSION: No evidence of deep venous thrombosis in either lower extremity. Electronically Signed   By: Bretta Bang III M.D.   On: 06/29/2021 09:52   ECHOCARDIOGRAM COMPLETE  Result Date: 06/28/2021    ECHOCARDIOGRAM REPORT   Patient Name:   Robert Mccann Date of Exam: 06/28/2021 Medical Rec #:  [id number]        Height:       71.0 in Accession #:    [id number]       Weight:       365.0 lb Date of Birth:  [birth year]       BSA:          2.723 m Patient Age:    24 years         BP:           124/88 mmHg Patient Gender: M                HR:           99 bpm. Exam Location:  [facility] Procedure: 2D Echo, Color Doppler, Cardiac Doppler, Strain Analysis and            Intracardiac Opacification Agent Indications:     I26.09 Pulmonary Embolus  History:         Patient has no prior history of Echocardiogram examinations.                  Risk Factors:Hypertension and Diabetes.  Sonographer:     Humphrey Rolls RDCS (AE) Referring Phys:  [id number] Sondra Barges Diagnosing Phys: Debbe Odea MD  Sonographer Comments: Suboptimal apical window and no subcostal window. Image acquisition challenging due to patient body habitus. Global longitudinal strain was attempted. IMPRESSIONS  1. 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 Recommend CMR for better analysis.. Left ventricular ejection fraction, by estimation, is <20%. The left ventricle has severely decreased function. The left ventricle demonstrates global hypokinesis. The left ventricular internal cavity size was severely dilated. Left ventricular diastolic parameters are consistent with Grade II diastolic dysfunction (pseudonormalization).  2. Right ventricular systolic function is severely reduced. The right ventricular size is not well visualized. There is normal pulmonary artery systolic pressure.  3. Right atrial size was mildly dilated.  4. The mitral valve is normal in structure. Mild mitral valve regurgitation.  5. The aortic valve is normal in structure. Aortic valve regurgitation is not visualized.  6. The inferior vena cava is dilated in size with <50% respiratory variability, suggesting right atrial pressure of 15 mmHg. 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 Recommend CMR for better analysis. Left ventricular ejection fraction, by estimation, is <20%. The  left ventricle has severely decreased function. The left ventricle demonstrates global hypokinesis. Definity contrast agent  was given IV to delineate the left ventricular endocardial borders. Global longitudinal strain performed but not reported based on interpreter judgement due to suboptimal tracking. The left ventricular internal cavity size was severely dilated. There is no left ventricular hypertrophy. Left ventricular diastolic parameters are consistent with Grade II diastolic dysfunction (pseudonormalization). Right Ventricle: The right ventricular size is not well visualized. No increase in right ventricular wall thickness. Right ventricular systolic function is  severely reduced. There is normal pulmonary artery systolic pressure. The tricuspid regurgitant velocity is 2.16 m/s, and with an assumed right atrial pressure of 15 mmHg, the estimated right ventricular systolic pressure is 33.7 mmHg. Left Atrium: Left atrial size was normal in size. Right Atrium: Right atrial size was mildly dilated. Pericardium: There is no evidence of pericardial effusion. Mitral Valve: The mitral valve is normal in structure. Mild mitral valve regurgitation. MV peak gradient, 3.2 mmHg. The mean mitral valve gradient is 1.0 mmHg. Tricuspid Valve: The tricuspid valve is normal in structure. Tricuspid valve regurgitation is mild. Aortic Valve: The aortic valve is normal in structure. Aortic valve regurgitation is not visualized. Aortic valve mean gradient measures 2.0 mmHg. Aortic valve peak gradient measures 2.6 mmHg. Aortic valve area, by VTI measures 2.19 cm. Pulmonic Valve: The pulmonic valve was normal in structure. Pulmonic valve regurgitation is not visualized. Aorta: The aortic root is normal in size and structure. Venous: The inferior vena cava is dilated in size with less than 50% respiratory variability, suggesting right atrial pressure of 15 mmHg. IAS/Shunts: No atrial level shunt detected by color flow Doppler.  LEFT VENTRICLE PLAX 2D LVIDd:         6.80 cm  Diastology LVIDs:         5.80 cm  LV e' lateral:   5.44 cm/s LV PW:         1.40 cm  LV E/e' lateral: 16.3 LV IVS:        0.90 cm LVOT diam:     2.00 cm LV SV:         27 LV SV Index:   10 LVOT Area:     3.14 cm  LEFT ATRIUM             Index LA diam:        4.50 cm 1.65 cm/m LA Vol (A2C):   67.4 ml 24.75 ml/m LA Vol (A4C):   86.9 ml 31.92 ml/m LA Biplane Vol: 83.4 ml 30.63 ml/m  AORTIC VALVE                   PULMONIC VALVE AV Area (Vmax):    2.49 cm    PV Vmax:       0.50 m/s AV Area (Vmean):   2.39 cm    PV Vmean:      35.400 cm/s AV Area (VTI):     2.19 cm    PV VTI:        0.078 m AV Vmax:           80.80 cm/s  PV  Peak grad:  1.0 mmHg AV Vmean:          58.300 cm/s PV Mean grad:  1.0 mmHg AV VTI:            0.122 m AV Peak Grad:      2.6 mmHg AV Mean Grad:      2.0 mmHg LVOT Vmax:         64.00 cm/s LVOT Vmean:  44.300 cm/s LVOT VTI:          0.085 m LVOT/AV VTI ratio: 0.70  AORTA Ao Root diam: 2.70 cm MITRAL VALVE               TRICUSPID VALVE MV Area (PHT): 6.07 cm    TR Peak grad:   18.7 mmHg MV Area VTI:   2.04 cm    TR Vmax:        216.00 cm/s MV Peak grad:  3.2 mmHg MV Mean grad:  1.0 mmHg    SHUNTS MV Vmax:       0.89 m/s    Systemic VTI:  0.09 m MV Vmean:      51.9 cm/s   Systemic Diam: 2.00 cm MV Decel Time: 125 msec MV E velocity: 88.57 cm/s Debbe Odea MD Electronically signed by Debbe Odea MD Signature Date/Time: 06/28/2021/12:01:20 PM    Final    ASSESSMENT AND PLAN:  Robert Mccann is a 25 y.o. male seen in ed with complaints of seen in the emergency room with complaints of substernal chest pain, ache, pressure, associated with shortness of breath worsened by exertion, nonproductive cough, some nausea and vomiting.  # Right Pulmonary embolism acute -- patient seen by vascular surgery Dr. Wyn Quaker. Since it was difficult to discern right heart strain given his massively dilated heart patient underwent mechanical thrombectomy. 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 cardiology. Recommends Coreg, Enterest[employer] and Le[employer] centre manager. -- Spironolactone as outpatient -- patient will have close follow-up with cardiology as outpatient -- check urine drug screen positive for benzodiazepines and marijuana -- sats more than 92% on room air  #morbid obesity with suspected sleep apnea -- patient advised dietary  restrictions and get formal sleep study as outpatient  #Hypertension -- on Coreg -- PRN hydralazine  #type II diabetes with cardiovascular disease/cardiomyopathy -- patient has history of diabetes however do not take any meds due to insurance reason -- start on Lantus, SSI -- will resume metformin at discharge -- A1c 8.8  dietitian to see patient   Procedures:mechanical thrombectomy. For pulmonary embolism Family communication : mother and girlfriend in the room Consults : vascular, cardiology CODE STATUS: full DVT Prophylaxis : IV heparin drip Level of care: Progressive Cardiac Status is: Inpatient  Remains inpatient appropriate because:Inpatient level of care appropriate due to severity of illness  Dispo: The patient is from: Home              Anticipated d/c is to: Home              Patient currently is not medically stable to d/c.   Difficult to place patient No   patient diagnosed with pulmonary embolism and new onset severe cardiomyopathy. Will need to be hospitalized for few days for further cardiac workup likely discharge home tomorrow    TOTAL TIME TAKING CARE OF THIS PATIENT: 35 minutes.  >50% time spent on counselling and coordination of care  Note: This dictation was prepared with Dragon dictation along with smaller phrase technology. Any transcriptional errors that result from this process are unintentional.  Enedina Finner M.D    [facility]   CC: Primary care physician; Patient, No Pcp Per (  Inactive) Patient ID: Robert Mccann, male   DOB: June 25, 1996, 25 y.o.   MRN: [id number]

## 2021-06-29 NOTE — Progress Notes (Signed)
Progress Note  Patient Name: Robert Mccann Date of Encounter: 06/29/2021  Primary Cardiologist:   Subjective   He reports his shortness of breath is much improved since his procedure yesterday. He denies chest pain.   In good spirits w/ family @ bedside.  Inpatient Medications    Scheduled Meds:  carvedilol  12.5 mg Oral BID WC   furosemide  40 mg Intravenous BID   insulin aspart  0-15 Units Subcutaneous TID WC   insulin aspart  0-5 Units Subcutaneous QHS   insulin aspart  0-9 Units Subcutaneous TID WC   insulin glargine  15 Units Subcutaneous QHS   sacubitril-valsartan  1 tablet Oral BID   sodium chloride flush  3 mL Intravenous Q12H   Continuous Infusions:  sodium chloride     heparin 2,100 Units/hr (06/29/21 0220)   PRN Meds: sodium chloride, acetaminophen **OR** acetaminophen, hydrALAZINE, morphine injection, sodium chloride flush   Vital Signs    Vitals:   06/28/21 1809 06/28/21 2200 06/29/21 0500 06/29/21 0724  BP: (!) 142/104 113/78 117/75 99/61  Pulse: (!) 108 99 98 95  Resp: 18 18 16 18   Temp: 98.3 F (36.8 C) 98.2 F (36.8 C) (!) 97.5 F (36.4 C) (!) 97.4 F (36.3 C)  TempSrc: Oral Oral Oral Oral  SpO2: 100% 99% 98% 99%  Weight:      Height:        Intake/Output Summary (Last 24 hours) at 06/29/2021 0741 Last data filed at 06/29/2021 0300 Gross per 24 hour  Intake 548.9 ml  Output 3420 ml  Net -2871.1 ml   Filed Weights   06/27/21 2027  Weight: (!) 165.6 kg    Physical Exam   GEN: Well nourished, well developed, in no acute distress.  HEENT: Grossly normal.  Neck: Supple, difficult to gauge JVD, carotid bruits, or masses. Cardiac: RRR, no murmurs, rubs, or gallops. No clubbing or cyanosis. Bilat lower ext trace edema.  Radials 2+, DP 2+ and equal bilaterally. R femoral access site C/D/I. Respiratory:  Respirations regular and unlabored, clear to auscultation bilaterally. GI: Obese, soft, nontender, nondistended, BS + x 4. MS: no  deformity or atrophy. Skin: warm and dry. Neuro:  Strength and sensation are intact. Psych: AAOx3.  Normal affect.  Labs    Chemistry Recent Labs  Lab 06/27/21 1853 06/28/21 0704  NA 136 136  K 3.7 3.9  CL 104 101  CO2 25 23  GLUCOSE 189* 227*  BUN 9 6  CREATININE 0.95 0.86  CALCIUM 8.8* 8.8*  PROT  --  6.9  ALBUMIN  --  3.3*  AST  --  58*  ALT  --  59*  ALKPHOS  --  84  BILITOT  --  1.5*  GFRNONAA >60 >60  ANIONGAP 7 12     Hematology Recent Labs  Lab 06/27/21 1853 06/28/21 0704 06/29/21 0613  WBC 8.1 8.9 8.4  RBC 4.81 4.66 4.62  HGB 14.7 14.3 14.2  HCT 43.6 41.6 41.0  MCV 90.6 89.3 88.7  MCH 30.6 30.7 30.7  MCHC 33.7 34.4 34.6  RDW 12.2 12.2 12.0  PLT 278 267 287    Cardiac Enzymes  Recent Labs  Lab 06/27/21 1853 06/27/21 2053  TROPONINIHS 38* 35*      BNP Recent Labs  Lab 06/27/21 1855  BNP 740.6*     Lipids  Lab Results  Component Value Date   CHOL 149 06/28/2021   HDL 31 (L) 06/28/2021   LDLCALC 92 06/28/2021   TRIG  129 06/28/2021   CHOLHDL 4.8 06/28/2021    HbA1c  Lab Results  Component Value Date   HGBA1C 8.8 (H) 06/28/2021    Radiology    DG Chest 2 View  Result Date: 06/27/2021 CLINICAL DATA:  Chest pain times 3-4 days. EXAM: CHEST - 2 VIEW COMPARISON:  None. FINDINGS: The cardiac silhouette is markedly enlarged. Both lungs are clear. The visualized skeletal structures are unremarkable. IMPRESSION: Cardiomegaly without an acute infiltrate. Sequelae associated with a large pericardial effusion cannot be excluded. Correlation with chest CT is recommended if this is of clinical concern. Electronically Signed   By: Aram Candela M.D.   On: 06/27/2021 19:11   CT Angio Chest PE W and/or Wo Contrast  Result Date: 06/27/2021 CLINICAL DATA:  Chest pain which began 3-4 days ago, since worsening EXAM: CT ANGIOGRAPHY CHEST WITH CONTRAST TECHNIQUE: Multidetector CT imaging of the chest was performed using the standard protocol  during bolus administration of intravenous contrast. Multiplanar CT image reconstructions and MIPs were obtained to evaluate the vascular anatomy. CONTRAST:  OMNIPAQUE IOHEXOL 350 MG/ML SOLN COMPARISON:  Radiograph 06/27/2021 FINDINGS: Cardiovascular: Facet opacification of pulmonary arteries seen extending from the right lower lobar pulmonary artery into the segmental branches of the lower lobe as well as in right upper lobar artery. Relative sparing of the pulmonary arteries of the right middle lobe. No visible filling defects are seen to the segmental level within the left lung. Central pulmonary arteries are normal caliber. While the RV-LV ratio is preserved, underlying cardiomegaly limits the clinical utility of this finding. There is evidence of reflux of contrast into the IVC. No other major venous abnormalities. Exam is not tailored for evaluation of the aorta. No gross acute aortic abnormality is evident. Normal branching of the proximal great vessels. No major venous abnormalities. Mediastinum/Nodes: No mediastinal fluid or gas. Normal thyroid gland and thoracic inlet. No acute abnormality of the trachea or esophagus. No worrisome mediastinal, hilar or axillary adenopathy. Lungs/Pleura: Wedge-shaped regions of peripheral ground-glass are seen in the lateral basal and posterior basal segments of the right lower lobe with a small right pleural effusion. Some additional mild interlobular septal thickening is noted diffusely throughout the lungs on a background of more diffuse hypoventilatory change. No pneumothorax is seen. No concerning pulmonary nodules or masses. Upper Abdomen: Hepatomegaly. No acute abnormalities present in the visualized portions of the upper abdomen. Musculoskeletal: No acute osseous abnormality or suspicious osseous lesion. Review of the MIP images confirms the above findings. IMPRESSION: 1. Pulmonary artery emboli extending from the lobar arteries of the right upper and lower  lobe throughout their segmental branches. Wedge-shaped areas of peripheral ground-glass opacity in the lateral basal and posterior basal segment right lower lobe concerning for developing pulmonary infarct with some trace associated effusion. Interlobular septal thickening, can reflect some mild pulmonary edema as well. 2. While the RV-LV ratio is preserved (0.74) there is underlying cardiomegaly which may limit the clinical utility of this finding. Reflux of contrast into the IVC could suggest elevated right heart pressures and supporting evidence of right heart strain. Consider further interrogation with echocardiography. Critical Value/emergent results were called by telephone at the time of interpretation on 06/27/2021 at 8:55 pm to provider [facility] , who verbally acknowledged these results. Electronically Signed   By: Kreg Shropshire M.D.   On: 06/27/2021 20:53    Telemetry    SR-ST with 3 episodes of 2-degree Mobitz II @ 23:36, 80-100 bpms - Personally Reviewed  Cardiac Studies  2D Echocardiogram 06.29.2022  1. 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  Recommend CMR for better analysis.. Left ventricular ejection fraction, by estimation, is <20%. The left  ventricle has severely decreased function. The left ventricle demonstrates global hypokinesis. The left ventricular internal cavity size was severely dilated. Left ventricular diastolic parameters are consistent with Grade II diastolic dysfunction (pseudonormalization).   2. Right ventricular systolic function is severely reduced. The right ventricular size is not well visualized. There is normal pulmonary artery systolic pressure.   3. Right atrial size was mildly dilated.   4. The mitral valve is normal in structure. Mild mitral valve  regurgitation.   5. The aortic valve is normal in structure. Aortic valve regurgitation is not  visualized.   6. The inferior vena cava is dilated in size with <50% respiratory variability, suggesting right atrial pressure of 15 mmHg.   Patient Profile     25 y.o. male with history of HTN, DM, obesity, who is being seen for the evaluation of shortness of breath and chest pain at the request of Dr. Allena Katz.   Assessment & Plan   1. Acute PE: Patient presented to ED on 6/28 c/o SOB and substernal chest pain. CT of chest revealed pulmonary artery emboli extending from the lobar arteries of the right upper and lower lobe throughout their segmental branches.  - s/p thrombectomy and thrombolysis 6/29 w. Vasc Surgery - reports SOB is much improved since procedure  - Heparin drip (will be) discontinued 6/30 and transitioned to eliquis PE dosing.  2. Cardiomyopathy:  - Echo this admission revealed LVEF <20% with global HK, grade II Diastolic dysfunction, with severely reduced RVSF.  - Neg 2.8L yesterday - Trace lower ext edema  - Continue IV diuresis today - Continue beta-blocker and Entresto  - Started on Farxiga 10 mg  - Will consider spiro next. -We discussed the importance of daily weights, sodium restriction, medication compliance, and symptom reporting and he verbalizes understanding.   3. HTN:  - Soft BPs this AM - Continue beta-blocker and Entresto.  4. DM2:  - Diagnosed 2 years ago - HA1C: 8.8 - SSI management per IM.  - As above, added Farxiga 10 mg   5.  Morbid Obesity: -Discussed importance of caloric restriction and increased activity (following recovery).  Signed, Nicolasa Ducking, NP  06/29/2021, 7:41 AM    For questions or updates, please contact   Please consult [email or web address] for contact info under Cardiology/STEMI.

## 2021-06-29 NOTE — Consult Note (Signed)
ANTICOAGULATION CONSULT NOTE  Pharmacy Consult for IV Heparin Indication: pulmonary embolus  Patient Measurements: Heparin Dosing Weight: 115.6 kg  Labs: Recent Labs    06/27/21 1853 06/27/21 2053 06/27/21 2304 06/28/21 0704 06/28/21 2224  HGB 14.7  --   --  14.3  --   HCT 43.6  --   --  41.6  --   PLT 278  --   --  267  --   APTT  --   --  30  --   --   LABPROT  --   --  14.2  --   --   INR  --   --  1.1  --   --   HEPARINUNFRC  --   --   --  0.67 0.29*  CREATININE 0.95  --   --  0.86  --   TROPONINIHS 38* 35*  --   --   --      Estimated Creatinine Clearance: 205.1 mL/min (by C-G formula based on SCr of 0.86 mg/dL).   Medications:  No anticoagulation prior to admission per my chart review. Medication reconciliation is pending.  Assessment: Patient is a 25 y/o M with medical history including obesity who presented to the ED 6/28 with mid-sternal chest pain and shortness of breath with exertion. CTA positive for PE. Patient is hemodynamically stable with mild tachycardia.   Pharmacy has been consulted to initiate/monitor heparin infusion for PE.  Baseline aPTT, CBC, and INR wnl's.   0629 0704 HL 0.67 @ 2000 units/hr - therapeutic x 1 0629 2224 HL 0.29, slightly subtherapeutic  Goal of Therapy:  Heparin level 0.3-0.7 units/ml Monitor platelets by anticoagulation protocol: Yes   Plan:  --Give 1500 unit bolus x 1 --Increase heparin drip to rate of 2100 units/hr --Heparin level in 6 hours after rate change --Daily CBC per protocol while on IV heparin  Otelia Sergeant, PharmD, [facility] 06/29/2021 12:48 AM

## 2021-06-29 NOTE — Plan of Care (Signed)
Nutrition Education Note  RD consulted for nutrition education regarding new onset CHF and uncontrolled T2DM.  RD provided "Low Sodium Nutrition Therapy" handout from the Academy of [employer]. Reviewed patient's dietary recall. Provided examples on ways to decrease sodium intake in diet. Discouraged intake of processed foods and use of salt shaker. Encouraged fresh fruits and vegetables as well as whole grain sources of carbohydrates to maximize fiber intake.   RD discussed why it is important for patient to adhere to diet recommendations, and emphasized the role of fluids, foods to avoid, and importance of weighing self daily.   Lab Results  Component Value Date   HGBA1C 8.8 (H) 06/28/2021   RD provided "Carbohydrate Counting for People with Diabetes" handout from the Academy of Nutrition and Dietetics. Discussed different food groups and their effects on blood sugar, emphasizing carbohydrate-containing foods. Provided list of carbohydrates and recommended serving sizes of common foods.  Discussed importance of controlled and consistent carbohydrate intake throughout the day. Provided examples of ways to balance meals/snacks and encouraged intake of high-fiber, whole grain complex carbohydrates.   Teach back method used.  Expect fair-good compliance.  Body mass index is 50.91 kg/m. Pt meets criteria for Obesity Class 3 based on current BMI.  Current diet order is 2 gram sodium, patient is consuming approximately 25% of meals at this time. Labs and medications reviewed.   RD to add Carb Modified restriction to diet order per request of MD.  RD contact information provided. RD placed referral to outpatient diabetes management center for additional education. If additional nutrition issues arise, please re-consult RD.  Vertell Limber, RD, LDN (she/her/hers) Registered Dietitian I After-Hours/Weekend Pager # [phone]

## 2021-06-29 NOTE — Consult Note (Signed)
ANTICOAGULATION CONSULT NOTE  Pharmacy Consult for IV Heparin Indication: pulmonary embolus  Patient Measurements: Heparin Dosing Weight: 115.6 kg  Labs: Recent Labs    06/27/21 1853 06/27/21 1853 06/27/21 2053 06/27/21 2304 06/28/21 0704 06/28/21 2224 06/29/21 0613 06/29/21 0708 06/29/21 1243  HGB 14.7  --   --   --  14.3  --  14.2  --   --   HCT 43.6  --   --   --  41.6  --  41.0  --   --   PLT 278  --   --   --  267  --  287  --   --   APTT  --   --   --  30  --   --   --   --   --   LABPROT  --   --   --  14.2  --   --   --   --   --   INR  --   --   --  1.1  --   --   --   --   --   HEPARINUNFRC  --    < >  --   --  0.67 0.29*  --  0.44 0.48  CREATININE 0.95  --   --   --  0.86  --   --   --   --   TROPONINIHS 38*  --  35*  --   --   --   --   --   --    < > = values in this interval not displayed.     Estimated Creatinine Clearance: 205.1 mL/min (by C-G formula based on SCr of 0.86 mg/dL).   Medications:  No anticoagulation prior to admission per my chart review. Medication reconciliation is pending.  Assessment: Patient is a 25 y/o M with medical history including HTN, T2DM, CMP, obesity who presented to the ED 6/28 with mid-sternal chest pain and shortness of breath with exertion.   CTA positive for Acute Rt-sided PE, but difficult to assess Rt-ht strain in dilated heart and patient underwent thrombectomy (6/29). Patient is hemodynamically stable with mild tachycardia, but is c/b new acute systolic HFrEF (EF20%). Pharmacy has been consulted to initiate/monitor heparin infusion for PE.  Baseline aPTT, CBC, and INR wnl's.  6/30 - H/H & Plts remain stable WNL  0629 0704 HL 0.67 @ 2000 units/hr - therapeutic x 1 0629 2224 HL 0.29, slightly subtherapeutic 0630 0708 HL 0.44; therapeutic x1 0630 1243 HL 0.48; therapeutic x2 6/30 transitioned to Eliquis in PM.  Goal of Therapy:  Heparin level 0.3-0.7 units/ml Monitor platelets by anticoagulation protocol: Yes    Plan:  Heparin level therapeutic @0 .48 (0630 1243). Per d/w Cards, transition to Eliquis for PE on 6/30. Continue current heparin infusion rate of 2100 units/hr to 1800 then stop. First dose of eliquis at 1900. Eliquis 10mg  BID x7day; then 5mg  BID thereafter. Daily CBC changed to q72h per protocol while on DOAC.  Pharmacy will sign-off at this time.  7/30, [facility] 06/29/2021 1:44 PM

## 2021-06-29 NOTE — Plan of Care (Signed)

## 2021-06-29 NOTE — Progress Notes (Signed)
[facility] Daily Progress Note  06/28/21:             1.  Contrast injection right heart             2.  Thrombolysis 6 mg of tPA to the right pulmonary arteries             3.  Mechanical thrombectomy right lower lobe, right middle lobe, and right upper lobe pulmonary arteries using the penumbra CAT 8 device             4.  Selective catheter placement right lower lobe, middle lobe, and upper lobe pulmonary arteries             5.  Selective catheter placement left main pulmonary artery  Subjective: Patient without complaint this AM. No issues overnight. States breathing is better  Venous Duplex: Negative  Objective: Vitals:   06/28/21 1809 06/28/21 2200 06/29/21 0500 06/29/21 0724  BP: (!) 142/104 113/78 117/75 99/61  Pulse: (!) 108 99 98 95  Resp: 18 18 16 18   Temp: 98.3 F (36.8 C) 98.2 F (36.8 C) (!) 97.5 F (36.4 C) (!) 97.4 F (36.3 C)  TempSrc: Oral Oral Oral Oral  SpO2: 100% 99% 98% 99%  Weight:      Height:        Intake/Output Summary (Last 24 hours) at 06/29/2021 1108 Last data filed at 06/29/2021 0950 Gross per 24 hour  Intake 908.9 ml  Output 3420 ml  Net -2511.1 ml   Physical Exam: A&Ox3, NAD CV: RRR Pulmonary: CTA Bilaterally Abdomen: Soft, Nontender, Nondistended Right Groin:  Access Site: PAD intact, clean and dry Vascular:  Lower Extremities: soft, nontender   Laboratory: CBC    Component Value Date/Time   WBC 8.4 06/29/2021 0613   HGB 14.2 06/29/2021 0613   HCT 41.0 06/29/2021 0613   PLT 287 06/29/2021 0613   BMET    Component Value Date/Time   NA 136 06/28/2021 0704   K 3.9 06/28/2021 0704   CL 101 06/28/2021 0704   CO2 23 06/28/2021 0704   GLUCOSE 227 (H) 06/28/2021 0704   BUN 6 06/28/2021 0704   CREATININE 0.86 06/28/2021 0704   CALCIUM 8.8 (L) 06/28/2021 0704   GFRNONAA >60 06/28/2021 0704   Assessment/Planning: The patient is a 25 year old male who presented with large PE s/p thrombectomy /  thrombolysis - POD#1  1) Patient is doing well. No acute issues overnight. 2) Reports improvement in SOB and chest pain. 3) Negative BLE venous duplex. 4) OK from vascular standpoint to transition from heparin to Eliquis. 5) Will continue to follow the patient in our clinic.  Discussed with Dr. 30 Tyshika Baldridge PA-C 06/29/2021 11:08 AM

## 2021-06-29 NOTE — Plan of Care (Signed)
  Problem: Education: Goal: Knowledge of General Education information will improve Description: Including pain rating scale, medication(s)/side effects and non-pharmacologic comfort measures Outcome: Progressing   Problem: Health Behavior/Discharge Planning: Goal: Ability to manage health-related needs will improve Outcome: Progressing   Problem: Clinical Measurements: Goal: Ability to maintain clinical measurements within normal limits will improve Outcome: Progressing Goal: Will remain free from infection Outcome: Progressing Goal: Diagnostic test results will improve Outcome: Progressing Goal: Respiratory complications will improve Outcome: Progressing Goal: Cardiovascular complication will be avoided Outcome: Progressing   Problem: Activity: Goal: Risk for activity intolerance will decrease Outcome: Progressing   Problem: Nutrition: Goal: Adequate nutrition will be maintained Outcome: Progressing   Problem: Coping: Goal: Level of anxiety will decrease Outcome: Progressing   Problem: Elimination: Goal: Will not experience complications related to bowel motility Outcome: Progressing Goal: Will not experience complications related to urinary retention Outcome: Progressing   Problem: Pain Managment: Goal: General experience of comfort will improve Outcome: Progressing   Problem: Safety: Goal: Ability to remain free from injury will improve Outcome: Progressing   Problem: Skin Integrity: Goal: Risk for impaired skin integrity will decrease Outcome: Progressing   Problem: Education: Goal: Ability to demonstrate management of disease process will improve Outcome: Progressing Goal: Ability to verbalize understanding of medication therapies will improve Outcome: Progressing Goal: Individualized Educational Video(s) Outcome: Progressing   Problem: Activity: Goal: Capacity to carry out activities will improve Outcome: Progressing   Problem: Cardiac: Goal:  Ability to achieve and maintain adequate cardiopulmonary perfusion will improve Outcome: Progressing   Problem: Education: Goal: Knowledge of General Education information will improve Description: Including pain rating scale, medication(s)/side effects and non-pharmacologic comfort measures Outcome: Progressing   Problem: Health Behavior/Discharge Planning: Goal: Ability to manage health-related needs will improve Outcome: Progressing   Problem: Clinical Measurements: Goal: Ability to maintain clinical measurements within normal limits will improve Outcome: Progressing Goal: Will remain free from infection Outcome: Progressing Goal: Diagnostic test results will improve Outcome: Progressing Goal: Respiratory complications will improve Outcome: Progressing Goal: Cardiovascular complication will be avoided Outcome: Progressing   Problem: Activity: Goal: Risk for activity intolerance will decrease Outcome: Progressing   Problem: Nutrition: Goal: Adequate nutrition will be maintained Outcome: Progressing   Problem: Coping: Goal: Level of anxiety will decrease Outcome: Progressing   Problem: Elimination: Goal: Will not experience complications related to bowel motility Outcome: Progressing Goal: Will not experience complications related to urinary retention Outcome: Progressing   Problem: Pain Managment: Goal: General experience of comfort will improve Outcome: Progressing   Problem: Safety: Goal: Ability to remain free from injury will improve Outcome: Progressing   Problem: Skin Integrity: Goal: Risk for impaired skin integrity will decrease Outcome: Progressing   Problem: Education: Goal: Ability to demonstrate management of disease process will improve Outcome: Progressing Goal: Ability to verbalize understanding of medication therapies will improve Outcome: Progressing Goal: Individualized Educational Video(s) Outcome: Progressing   Problem:  Activity: Goal: Capacity to carry out activities will improve Outcome: Progressing   Problem: Cardiac: Goal: Ability to achieve and maintain adequate cardiopulmonary perfusion will improve Outcome: Progressing   

## 2021-06-29 NOTE — Consult Note (Signed)
ANTICOAGULATION CONSULT NOTE  Pharmacy Consult for IV Heparin Indication: pulmonary embolus  Patient Measurements: Heparin Dosing Weight: 115.6 kg  Labs: Recent Labs    06/27/21 1853 06/27/21 2053 06/27/21 2304 06/28/21 0704 06/28/21 2224 06/29/21 0613  HGB 14.7  --   --  14.3  --  14.2  HCT 43.6  --   --  41.6  --  41.0  PLT 278  --   --  267  --  287  APTT  --   --  30  --   --   --   LABPROT  --   --  14.2  --   --   --   INR  --   --  1.1  --   --   --   HEPARINUNFRC  --   --   --  0.67 0.29*  --   CREATININE 0.95  --   --  0.86  --   --   TROPONINIHS 38* 35*  --   --   --   --      Estimated Creatinine Clearance: 205.1 mL/min (by C-G formula based on SCr of 0.86 mg/dL).   Medications:  No anticoagulation prior to admission per my chart review. Medication reconciliation is pending.  Assessment: Patient is a 25 y/o M with medical history including HTN, T2DM, CMP, obesity who presented to the ED 6/28 with mid-sternal chest pain and shortness of breath with exertion.   CTA positive for Acute Rt-sided PE, but difficult to assess Rt-ht strain in dilated heart and patient underwent thrombectomy. Patient is hemodynamically stable with mild tachycardia, but is c/b new acute systolic HFrEF (EF20%). Pharmacy has been consulted to initiate/monitor heparin infusion for PE.  Baseline aPTT, CBC, and INR wnl's.  6/30 - H/H & Plts remain stable WNL  0629 0704 HL 0.67 @ 2000 units/hr - therapeutic x 1 0629 2224 HL 0.29, slightly subtherapeutic 0630 0708 HL 0.44; therapeutic x1  Goal of Therapy:  Heparin level 0.3-0.7 units/ml Monitor platelets by anticoagulation protocol: Yes   Plan:  Heparin level therapeutic @0 .44 (0630 0708) Continue current heparin infusion rate of 2100 units/hr Confirm with heparin level in 6 hours; then AM daily thereafter. Daily CBC per protocol while on IV heparin  , [facility] 06/29/2021 7:23 AM

## 2021-06-30 LAB — PROTEIN S ACTIVITY: Protein S Activity: 76 % (ref 63–140)

## 2021-06-30 LAB — LUPUS ANTICOAGULANT PANEL
DRVVT: 48.2 s — ABNORMAL HIGH (ref 0.0–47.0)
PTT Lupus Anticoagulant: 45.1 s (ref 0.0–51.9)

## 2021-06-30 LAB — PROTEIN S, TOTAL: Protein S Ag, Total: 85 % (ref 60–150)

## 2021-06-30 LAB — BETA-2-GLYCOPROTEIN I ABS, IGG/M/A
Beta-2 Glyco I IgG: <9 GPI IgG units (ref 0–20)
Beta-2-Glycoprotein I IgA: <9 GPI IgA units (ref 0–25)
Beta-2-Glycoprotein I IgM: <9 GPI IgM units (ref 0–32)

## 2021-06-30 LAB — DRVVT CONFIRM: dRVVT Confirm: 1.1 ratio (ref 0.8–1.2)

## 2021-06-30 LAB — PROTEIN C ACTIVITY: Protein C Activity: 134 % (ref 73–180)

## 2021-06-30 LAB — PROTEIN C, TOTAL: Protein C, Total: 95 % (ref 60–150)

## 2021-06-30 LAB — DRVVT MIX: dRVVT Mix: 41 s — ABNORMAL HIGH (ref 0.0–40.4)

## 2021-06-30 LAB — GLUCOSE, CAPILLARY
Glucose-Capillary: 183 mg/dL — ABNORMAL HIGH (ref 70–99)
Glucose-Capillary: 217 mg/dL — ABNORMAL HIGH (ref 70–99)

## 2021-06-30 MED ORDER — APIXABAN 5 MG PO TABS: 10.0000 mg | ORAL_TABLET | Freq: Two times a day (BID) | ORAL | 3 refills | Status: DC

## 2021-06-30 MED ORDER — METFORMIN HCL 500 MG PO TABS: 500.0000 mg | ORAL_TABLET | Freq: Two times a day (BID) | ORAL | 3 refills | Status: DC

## 2021-06-30 MED ORDER — FUROSEMIDE 20 MG PO TABS: 20.0000 mg | ORAL_TABLET | Freq: Every day | ORAL | Status: DC

## 2021-06-30 MED ORDER — SACUBITRIL-VALSARTAN 24-26 MG PO TABS: 1.0000 | ORAL_TABLET | Freq: Two times a day (BID) | ORAL | 3 refills | Status: DC

## 2021-06-30 MED ORDER — DAPAGLIFLOZIN PROPANEDIOL 10 MG PO TABS: 10.0000 mg | ORAL_TABLET | Freq: Every day | ORAL | 3 refills | Status: DC

## 2021-06-30 MED ORDER — CARVEDILOL 6.25 MG PO TABS
6.2500 mg | ORAL_TABLET | Freq: Two times a day (BID) | ORAL | Status: DC
  Administered 2021-06-30: 6.25 mg via ORAL
  Filled 2021-06-30: qty 1

## 2021-06-30 MED ORDER — CARVEDILOL 6.25 MG PO TABS: 6.2500 mg | ORAL_TABLET | Freq: Two times a day (BID) | ORAL | 3 refills | Status: DC

## 2021-06-30 MED ORDER — ATROPINE SULFATE 1 MG/10ML IJ SOSY: 0.5000 mg | PREFILLED_SYRINGE | INTRAMUSCULAR | Status: DC | PRN

## 2021-06-30 MED ORDER — BIOTIN 1000 MCG PO TABS: 1000.0000 ug | ORAL_TABLET | Freq: Every day | ORAL | 3 refills | Status: DC

## 2021-06-30 MED ORDER — FUROSEMIDE 20 MG PO TABS: 20.0000 mg | ORAL_TABLET | Freq: Every day | ORAL | 3 refills | Status: DC

## 2021-06-30 NOTE — Discharge Summary (Signed)
[facility]   PATIENT NAME: Robert Mccann    MR#:  [id number]  DATE OF BIRTH:  [birth year]  DATE OF ADMISSION:  06/27/2021 ADMITTING PHYSICIAN: Enedina Finner, MD  DATE OF DISCHARGE: 7/1/202  PRIMARY CARE PHYSICIAN: Patient, No Pcp Per (Inactive)    ADMISSION DIAGNOSIS:  SOB (shortness of breath) [R06.02] Hyperglycemia [R73.9] Elevated brain natriuretic peptide (BNP) level [R79.89] Troponin I above reference range [R77.8] Chest pain [R07.9] Heart failure, unspecified HF chronicity, unspecified heart failure type (HCC) [I50.9] Acute pulmonary embolism with acute cor pulmonale, unspecified pulmonary embolism type (HCC) [I26.09] PE (pulmonary thromboembolism) (HCC) [I26.99]  DISCHARGE DIAGNOSIS:  Acute right pulmonary embolism status post mechanical thrombectomy.  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 male seen in ed with complaints of seen in the emergency room with complaints of substernal chest pain, ache, pressure, associated with shortness of breath worsened by exertion, nonproductive cough, some nausea and vomiting.   # Right Pulmonary embolism acute -- patient seen by vascular surgery Dr. Wyn Quaker. Since it was difficult to discern right heart strain given his massively dilated heart patient underwent mechanical thrombectomy. -- IV heparin drip-- now changed to oral eliquis since no plans for cardiac cath -- ultrasound venous Doppler bilateral lower extremity negative for DVT --sats >92% On RA   #acute systolic congestive heart failure-- new onset severe cardiomyopathy EF of less than 20% on echo -- elevated BNP 740 -- IV Lasix good urine  output--change to po lasix -- seen by [facility] cardiology. Recommends Coreg, Enterest[employer] and [employer] manager. -- Spironolactone as outpatient -- patient will have close follow-up with cardiology as outpatient -- check urine drug screen positive for benzodiazepines and marijuana -- sats more than 92% on room air --OSA w/u by Cardiology   #morbid obesity with suspected sleep apnea -- patient advised dietary restrictions and get formal sleep study as outpatient   #Hypertension -- on Coreg -- PRN hydralazine   #type II diabetes with cardiovascular disease/cardiomyopathy -- will resume metformin at discharge and cont farxiga -- A1c 8.8   dietitian has seen pt overall clinically improved. Discussed with patient regarding meds, follow-up visits, dietary changes and lifestyle modification. He voiced understanding.     Procedures:mechanical thrombectomy. For pulmonary embolism Family communication : mother and girlfriend in the room 07/01/21 Consults : vascular, cardiology CODE STATUS: full DVT Prophylaxis : eliquis Level of care: Progressive Cardiac Status is: Inpatient      Dispo: The patient is from: Home              Anticipated d/c is to: Home              Patient currently is  medically  optimized for d/c.              Difficult to place patient No     CONSULTS OBTAINED:  Treatment Team:  Debbe Odea, MD  DRUG ALLERGIES:  No Known Allergies  DISCHARGE MEDICATIONS:   Allergies as of 06/30/2021   No Known Allergies      Medication List     STOP taking these medications    hydrochlorothiazide 12.5 MG capsule Commonly known as: MICROZIDE   sulfamethoxazole-trimethoprim 800-160 MG  tablet Commonly known as: BACTRIM DS       TAKE these medications    apixaban 5 MG Tabs tablet Commonly known as: ELIQUIS Take 2 tablets (10 mg total) by mouth 2 (two) times daily. And then from 07/06/2021 take 1 tablet (5 mg) 2 times a daily   Biotin 1000 MCG tablet Take 1 tablet (1 mg  total) by mouth daily. Chew 1 gummy daily. What changed:  medication strength how much to take   carvedilol 6.25 MG tablet Commonly known as: COREG Take 1 tablet (6.25 mg total) by mouth 2 (two) times daily with a meal.   dapagliflozin propanediol 10 MG Tabs tablet Commonly known as: FARXIGA Take 1 tablet (10 mg total) by mouth daily. Start taking on: July 01, 2021   furosemide 20 MG tablet Commonly known as: LASIX Take 1 tablet (20 mg total) by mouth daily. Start taking on: July 01, 2021   metFORMIN 500 MG tablet Commonly known as: GLUCOPHAGE Take 1 tablet (500 mg total) by mouth 2 (two) times daily.   ONE-A-DAY MENS PO Take 1 tablet by mouth daily.   sacubitril-valsartan 24-26 MG Commonly known as: ENTRESTO Take 1 tablet by mouth 2 (two) times daily.        If you experience worsening of your admission symptoms, develop shortness of breath, life threatening emergency, suicidal or homicidal thoughts you must seek medical attention immediately by calling 911 or calling your MD immediately  if symptoms less severe.  You Must read complete instructions/literature along with all the possible adverse reactions/side effects for all the Medicines you take and that have been prescribed to you. Take any new Medicines after you have completely understood and accept all the possible adverse reactions/side effects.   Please note  You were cared for by a hospitalist during your hospital stay. If you have any questions about your discharge medications or the care you received while you were in the hospital after you are discharged, you can call the unit and asked to speak with the hospitalist on call if the hospitalist that took care of you is not available. Once you are discharged, your primary care physician will handle any further medical issues. Please note that NO REFILLS for any discharge medications will be authorized once you are discharged, as it is imperative that you return to  your primary care physician (or establish a relationship with a primary care physician if you do not have one) for your aftercare needs so that they can reassess your need for medications and monitor your lab values. Today   SUBJECTIVE   No new complaints  VITAL SIGNS:  Blood pressure 101/68, pulse 97, temperature 97.9 F (36.6 C), resp. rate 18, height 5\' 11"  (1.803 m), weight (!) 165.6 kg, SpO2 100 %.  I/O:   Intake/Output Summary (Last 24 hours) at 06/30/2021 1159 Last data filed at 06/30/2021 1005 Gross per 24 hour  Intake 840 ml  Output 400 ml  Net 440 ml    PHYSICAL EXAMINATION:  GENERAL:  25 y.o.-year-old patient lying in the bed with no acute distress. SEVERE OBESITY LUNGS: Normal breath sounds bilaterally, no wheezing, rales, rhonchi. No use of accessory muscles of respiration. CARDIOVASCULAR: S1, S2 normal. No murmurs, rubs, or gallops. ABDOMEN: Soft, nontender, nondistended. Bowel sounds present. No organomegaly or mass. EXTREMITIES: No cyanosis, clubbing or edema b/l.    NEUROLOGIC: non focal PSYCHIATRIC:  patient is alert and oriented x 3. SKIN: No obvious rash, lesion, or ulcer.  DATA REVIEW:  CBC  Recent Labs  Lab 06/29/21 0613  WBC 8.4  HGB 14.2  HCT 41.0  PLT 287    Chemistries  Recent Labs  Lab 06/28/21 0704  NA 136  K 3.9  CL 101  CO2 23  GLUCOSE 227*  BUN 6  CREATININE 0.86  CALCIUM 8.8*  AST 58*  ALT 59*  ALKPHOS 84  BILITOT 1.5*    Microbiology Results   Recent Results (from the past 240 hour(s))  Resp Panel by RT-PCR (Flu A&B, Covid) Nasopharyngeal Swab     Status: None   Collection Time: 06/27/21  7:36 PM   Specimen: Nasopharyngeal Swab; Nasopharyngeal(NP) swabs in vial transport medium  Result Value Ref Range Status   SARS Coronavirus 2 by RT PCR NEGATIVE NEGATIVE Final    Comment: (NOTE) SARS-CoV-2 target nucleic acids are NOT DETECTED.  The SARS-CoV-2 RNA is generally detectable in upper respiratory specimens during the  acute phase of infection. The lowest concentration of SARS-CoV-2 viral copies this assay can detect is 138 copies/mL. A negative result does not preclude SARS-Cov-2 infection and should not be used as the sole basis for treatment or other patient management decisions. A negative result may occur with  improper specimen collection/handling, submission of specimen other than nasopharyngeal swab, presence of viral mutation(s) within the areas targeted by this assay, and inadequate number of viral copies(<138 copies/mL). A negative result must be combined with clinical observations, patient history, and epidemiological information. The expected result is Negative.  Fact Sheet for Patients:  BloggerCourse.com  Fact Sheet for Healthcare Providers:  SeriousBroker.it  This test is no t yet approved or cleared by the [place] FDA and  has been authorized for detection and/or diagnosis of SARS-CoV-2 by FDA under an Emergency Use Authorization (EUA). This EUA will remain  in effect (meaning this test can be used) for the duration of the COVID-19 declaration under Section 564(b)(1) of the Act, 21 U.S.C.section 360bbb-3(b)(1), unless the authorization is terminated  or revoked sooner.       Influenza A by PCR NEGATIVE NEGATIVE Final   Influenza B by PCR NEGATIVE NEGATIVE Final    Comment: (NOTE) The Xpert Xpress SARS-CoV-2/FLU/RSV plus assay is intended as an aid in the diagnosis of influenza from Nasopharyngeal swab specimens and should not be used as a sole basis for treatment. Nasal washings and aspirates are unacceptable for Xpert Xpress SARS-CoV-2/FLU/RSV testing.  Fact Sheet for Patients: BloggerCourse.com  Fact Sheet for Healthcare Providers: SeriousBroker.it  This test is not yet approved or cleared by the [place] FDA and has been authorized for detection and/or  diagnosis of SARS-CoV-2 by FDA under an Emergency Use Authorization (EUA). This EUA will remain in effect (meaning this test can be used) for the duration of the COVID-19 declaration under Section 564(b)(1) of the Act, 21 U.S.C. section 360bbb-3(b)(1), unless the authorization is terminated or revoked.  Performed at [facility], [address], [place], [place] [address]     RADIOLOGY:  PERIPHERAL VASCULAR CATHETERIZATION  Result Date: 06/28/2021 See surgical note for result.  US Venous Img Lower Bilateral (DVT)  Result Date: 06/29/2021 CLINICAL DATA:  Lower extremity edema and shortness of breath. Pulmonary embolus on recent CT angiogram chest study. EXAM: BILATERAL LOWER EXTREMITY VENOUS DUPLEX ULTRASOUND TECHNIQUE: Gray-scale sonography with graded compression, as well as color Doppler and duplex ultrasound were performed to evaluate the lower extremity deep venous systems from the level of the common femoral vein and including the common femoral, femoral, profunda femoral,  popliteal and calf veins including the posterior tibial, peroneal and gastrocnemius veins when visible. The superficial great saphenous vein was also interrogated. Spectral Doppler was utilized to evaluate flow at rest and with distal augmentation maneuvers in the common femoral, femoral and popliteal veins. COMPARISON:  CT angiogram chest June 27, 2021 FINDINGS: RIGHT LOWER EXTREMITY Common Femoral Vein: No evidence of thrombus. Normal compressibility, respiratory phasicity and response to augmentation. Saphenofemoral Junction: No evidence of thrombus. Normal compressibility and flow on color Doppler imaging. Profunda Femoral Vein: No evidence of thrombus. Normal compressibility and flow on color Doppler imaging. Femoral Vein: No evidence of thrombus. Normal compressibility, respiratory phasicity and response to augmentation. Popliteal Vein: No evidence of thrombus. Normal compressibility, respiratory phasicity  and response to augmentation. Calf Veins: No evidence of thrombus. Normal compressibility and flow on color Doppler imaging. Superficial Great Saphenous Vein: No evidence of thrombus. Normal compressibility. Venous Reflux:  None. Other Findings:  None. LEFT LOWER EXTREMITY Common Femoral Vein: No evidence of thrombus. Normal compressibility, respiratory phasicity and response to augmentation. Saphenofemoral Junction: No evidence of thrombus. Normal compressibility and flow on color Doppler imaging. Profunda Femoral Vein: No evidence of thrombus. Normal compressibility and flow on color Doppler imaging. Femoral Vein: No evidence of thrombus. Normal compressibility, respiratory phasicity and response to augmentation. Popliteal Vein: No evidence of thrombus. Normal compressibility, respiratory phasicity and response to augmentation. Calf Veins: No evidence of thrombus. Normal compressibility and flow on color Doppler imaging. Superficial Great Saphenous Vein: No evidence of thrombus. Normal compressibility. Venous Reflux:  None. Other Findings:  None. IMPRESSION: No evidence of deep venous thrombosis in either lower extremity. Electronically Signed   By: Bretta Bang III M.D.   On: 06/29/2021 09:52     CODE STATUS:     Code Status Orders  (From admission, onward)           Start     Ordered   06/27/21 2250  Full code  Continuous        06/27/21 2249           Code Status History     This patient has a current code status but no historical code status.        TOTAL TIME TAKING CARE OF THIS PATIENT: 35 minutes.    Enedina Finner M.D  [facility]    CC: Primary care physician; Patient, No Pcp Per (Inactive)

## 2021-06-30 NOTE — Progress Notes (Signed)
Progress Note  Patient Name: Robert Mccann Date of Encounter: 06/30/2021  Primary Cardiologist: None  Subjective   Feels well this AM.  No chest pain or dyspnea.  Hasn't really been up and ambulating out of room yet.  Eager to go home today.  Inpatient Medications    Scheduled Meds:  apixaban  10 mg Oral BID   Followed by   Melene Muller ON 07/06/2021] apixaban  5 mg Oral BID   carvedilol  12.5 mg Oral BID WC   dapagliflozin propanediol  10 mg Oral Daily   furosemide  40 mg Intravenous BID   insulin aspart  0-15 Units Subcutaneous TID WC   insulin aspart  0-5 Units Subcutaneous QHS   insulin glargine  15 Units Subcutaneous QHS   sacubitril-valsartan  1 tablet Oral BID   sodium chloride flush  3 mL Intravenous Q12H   Continuous Infusions:  sodium chloride     PRN Meds: sodium chloride, acetaminophen **OR** acetaminophen, atropine, hydrALAZINE, morphine injection, ondansetron (ZOFRAN) IV, sodium chloride flush   Vital Signs    Vitals:   06/29/21 2100 06/30/21 0409 06/30/21 0420 06/30/21 0757  BP: 102/73 (!) 87/59 109/76 112/81  Pulse: 89 83 86 89  Resp: 18 16  18   Temp: 98.1 F (36.7 C) 97.9 F (36.6 C)  98 F (36.7 C)  TempSrc: Oral Oral  Oral  SpO2: 99% 97%  97%  Weight:      Height:        Intake/Output Summary (Last 24 hours) at 06/30/2021 0840 Last data filed at 06/30/2021 0801 Gross per 24 hour  Intake 960 ml  Output 1350 ml  Net -390 ml   Filed Weights   06/27/21 2027  Weight: (!) 165.6 kg    Physical Exam   GEN: Obese, in no acute distress.  HEENT: Grossly normal.  Neck: Supple, no JVD, carotid bruits, or masses. Cardiac: RRR, no murmurs, rubs, or gallops. No clubbing, cyanosis, edema.  Radials 2+, DP/PT 2+ and equal bilaterally.  Respiratory:  Respirations regular and unlabored, clear to auscultation bilaterally. GI: Soft, nontender, nondistended, BS + x 4. MS: no deformity or atrophy. Skin: warm and dry, no rash. Neuro:  Strength and sensation  are intact. Psych: AAOx3.  Normal affect.  Labs    Chemistry Recent Labs  Lab 06/27/21 1853 06/28/21 0704  NA 136 136  K 3.7 3.9  CL 104 101  CO2 25 23  GLUCOSE 189* 227*  BUN 9 6  CREATININE 0.95 0.86  CALCIUM 8.8* 8.8*  PROT  --  6.9  ALBUMIN  --  3.3*  AST  --  58*  ALT  --  59*  ALKPHOS  --  84  BILITOT  --  1.5*  GFRNONAA >60 >60  ANIONGAP 7 12     Hematology Recent Labs  Lab 06/27/21 1853 06/28/21 0704 06/29/21 0613  WBC 8.1 8.9 8.4  RBC 4.81 4.66 4.62  HGB 14.7 14.3 14.2  HCT 43.6 41.6 41.0  MCV 90.6 89.3 88.7  MCH 30.6 30.7 30.7  MCHC 33.7 34.4 34.6  RDW 12.2 12.2 12.0  PLT 278 267 287    Cardiac Enzymes  Recent Labs  Lab 06/27/21 1853 06/27/21 2053  TROPONINIHS 38* 35*      BNP Recent Labs  Lab 06/27/21 1855  BNP 740.6*    Lipids  Lab Results  Component Value Date   CHOL 149 06/28/2021   HDL 31 (L) 06/28/2021   LDLCALC 92 06/28/2021   TRIG  129 06/28/2021   CHOLHDL 4.8 06/28/2021    HbA1c  Lab Results  Component Value Date   HGBA1C 8.8 (H) 06/28/2021    Radiology    DG Chest 2 View  Result Date: 06/27/2021 CLINICAL DATA:  Chest pain times 3-4 days. EXAM: CHEST - 2 VIEW COMPARISON:  None. FINDINGS: The cardiac silhouette is markedly enlarged. Both lungs are clear. The visualized skeletal structures are unremarkable. IMPRESSION: Cardiomegaly without an acute infiltrate. Sequelae associated with a large pericardial effusion cannot be excluded. Correlation with chest CT is recommended if this is of clinical concern. Electronically Signed   By: Aram Candela M.D.   On: 06/27/2021 19:11   CT Angio Chest PE W and/or Wo Contrast  Result Date: 06/27/2021 CLINICAL DATA:  Chest pain which began 3-4 days ago, since worsening EXAM: CT ANGIOGRAPHY CHEST WITH CONTRAST TECHNIQUE: Multidetector CT imaging of the chest was performed using the standard protocol during bolus administration of intravenous contrast. Multiplanar CT image  reconstructions and MIPs were obtained to evaluate the vascular anatomy. CONTRAST:  OMNIPAQUE IOHEXOL 350 MG/ML SOLN COMPARISON:  Radiograph 06/27/2021 FINDINGS: Cardiovascular: Facet opacification of pulmonary arteries seen extending from the right lower lobar pulmonary artery into the segmental branches of the lower lobe as well as in right upper lobar artery. Relative sparing of the pulmonary arteries of the right middle lobe. No visible filling defects are seen to the segmental level within the left lung. Central pulmonary arteries are normal caliber. While the RV-LV ratio is preserved, underlying cardiomegaly limits the clinical utility of this finding. There is evidence of reflux of contrast into the IVC. No other major venous abnormalities. Exam is not tailored for evaluation of the aorta. No gross acute aortic abnormality is evident. Normal branching of the proximal great vessels. No major venous abnormalities. Mediastinum/Nodes: No mediastinal fluid or gas. Normal thyroid gland and thoracic inlet. No acute abnormality of the trachea or esophagus. No worrisome mediastinal, hilar or axillary adenopathy. Lungs/Pleura: Wedge-shaped regions of peripheral ground-glass are seen in the lateral basal and posterior basal segments of the right lower lobe with a small right pleural effusion. Some additional mild interlobular septal thickening is noted diffusely throughout the lungs on a background of more diffuse hypoventilatory change. No pneumothorax is seen. No concerning pulmonary nodules or masses. Upper Abdomen: Hepatomegaly. No acute abnormalities present in the visualized portions of the upper abdomen. Musculoskeletal: No acute osseous abnormality or suspicious osseous lesion. Review of the MIP images confirms the above findings. IMPRESSION: 1. Pulmonary artery emboli extending from the lobar arteries of the right upper and lower lobe throughout their segmental branches. Wedge-shaped areas of peripheral  ground-glass opacity in the lateral basal and posterior basal segment right lower lobe concerning for developing pulmonary infarct with some trace associated effusion. Interlobular septal thickening, can reflect some mild pulmonary edema as well. 2. While the RV-LV ratio is preserved (0.74) there is underlying cardiomegaly which may limit the clinical utility of this finding. Reflux of contrast into the IVC could suggest elevated right heart pressures and supporting evidence of right heart strain. Consider further interrogation with echocardiography. Critical Value/emergent results were called by telephone at the time of interpretation on 06/27/2021 at 8:55 pm to provider [facility] , who verbally acknowledged these results. Electronically Signed   By: Kreg Shropshire M.D.   On: 06/27/2021 20:53   US Venous Img Lower Bilateral (DVT)  Result Date: 06/29/2021 CLINICAL DATA:  Lower extremity edema and shortness of breath. Pulmonary embolus on recent  CT angiogram chest study. EXAM: BILATERAL LOWER EXTREMITY VENOUS DUPLEX ULTRASOUND TECHNIQUE: Gray-scale sonography with graded compression, as well as color Doppler and duplex ultrasound were performed to evaluate the lower extremity deep venous systems from the level of the common femoral vein and including the common femoral, femoral, profunda femoral, popliteal and calf veins including the posterior tibial, peroneal and gastrocnemius veins when visible. The superficial great saphenous vein was also interrogated. Spectral Doppler was utilized to evaluate flow at rest and with distal augmentation maneuvers in the common femoral, femoral and popliteal veins. COMPARISON:  CT angiogram chest June 27, 2021 FINDINGS: RIGHT LOWER EXTREMITY Common Femoral Vein: No evidence of thrombus. Normal compressibility, respiratory phasicity and response to augmentation. Saphenofemoral Junction: No evidence of thrombus. Normal compressibility and flow on color Doppler imaging. Profunda  Femoral Vein: No evidence of thrombus. Normal compressibility and flow on color Doppler imaging. Femoral Vein: No evidence of thrombus. Normal compressibility, respiratory phasicity and response to augmentation. Popliteal Vein: No evidence of thrombus. Normal compressibility, respiratory phasicity and response to augmentation. Calf Veins: No evidence of thrombus. Normal compressibility and flow on color Doppler imaging. Superficial Great Saphenous Vein: No evidence of thrombus. Normal compressibility. Venous Reflux:  None. Other Findings:  None. LEFT LOWER EXTREMITY Common Femoral Vein: No evidence of thrombus. Normal compressibility, respiratory phasicity and response to augmentation. Saphenofemoral Junction: No evidence of thrombus. Normal compressibility and flow on color Doppler imaging. Profunda Femoral Vein: No evidence of thrombus. Normal compressibility and flow on color Doppler imaging. Femoral Vein: No evidence of thrombus. Normal compressibility, respiratory phasicity and response to augmentation. Popliteal Vein: No evidence of thrombus. Normal compressibility, respiratory phasicity and response to augmentation. Calf Veins: No evidence of thrombus. Normal compressibility and flow on color Doppler imaging. Superficial Great Saphenous Vein: No evidence of thrombus. Normal compressibility. Venous Reflux:  None. Other Findings:  None. IMPRESSION: No evidence of deep venous thrombosis in either lower extremity. Electronically Signed   By: Bretta Bang III M.D.   On: 06/29/2021 09:52   Telemetry    RSR, 80's to 90's w/ brief episodes of bradycardia and mobitz II overnight - Personally Reviewed  Cardiac Studies   2D Echocardiogram 06.29.2022   1. 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  Recommend CMR for better analysis.. Left ventricular ejection fraction, by estimation, is <20%. The left   ventricle has severely decreased function. The left ventricle demonstrates global hypokinesis. The left ventricular internal cavity size was severely dilated. Left ventricular diastolic parameters are consistent with Grade II diastolic dysfunction (pseudonormalization).   2. Right ventricular systolic function is severely reduced. The right ventricular size is not well visualized. There is normal pulmonary artery systolic pressure.   3. Right atrial size was mildly dilated.   4. The mitral valve is normal in structure. Mild mitral valve  regurgitation.   5. The aortic valve is normal in structure. Aortic valve regurgitation is not visualized.   6. The inferior vena cava is dilated in size with <50% respiratory variability, suggesting right atrial pressure of 15 mmHg.   Patient Profile     25 y.o. male with history of HTN, DM, obesity, who is being seen for the evaluation of shortness of breath and chest pain at the request of Dr. Allena Katz.   Assessment & Plan    1.  Acute PE:  Patient presented to ED on 6/28 c/o SOB and substernal chest pain. CT of chest revealed  pulmonary artery emboli extending from the lobar arteries of the right upper and lower lobe throughout their segmental branches.  He is s/p thrombectomy and thrombolysis on 6/29.  Transitioned to eliquis 6/30.  Lower ext u/s neg for DVT, though pt notes that he recently traveled to [facility] via bus - 12 hrs each way.  Echo w/ sev LV and RV dysfxn.  Feels well this AM.  No chest pain or dyspnea.  Ambulate.  2.  Cardiomyopathy:  Echo w/ EF <20%, glob HK, grII diast dysfxn, and sev reduced RV fxn (in setting of PE).  Trace edema on exam yesterday.  Listed as +10 ml yesterday, but minus 3.2 L since admission.  Today, he feels well and appears euvolemic.  BP soft @ times.  Will reduce carvedilol to 6.25 bid.  Cont entresto and farxiga.  No bp room for spiro @ this time.  I suspect that he will need at least low dose daily lasix at discharge  20mg   daily.  We discussed the importance of daily weights, sodium restriction, medication compliance, and symptom reporting and he verbalizes understanding.  Will need ischemic eval @ some point and we can likely arrange for cor CTA at some point in the future. Will plan to see him in clinic in 1 wk.  3.  Essential HTN:  well-controlled currently.  Reducing carvedilol to 6.25mg  bid as above.  Cont current dose of entresto.  4.  DMII:  A1c 8.8.  Farxiga added yesterday.  Insulin mgmt per IM.  5.  Mobitz II heart block:  brief episodes noted overnight.  No HB during waking hours. Suspect he has some degree of OSA.  Will arrange for outpt sleep eval/ [employer] at office f/u.  6.  Morbid obesity:  Discussed need for wt loss.  Signed, , NP  06/30/2021, 8:40 AM    For questions or updates, please contact   Please consult [email or web address] for contact info under Cardiology/STEMI.

## 2021-06-30 NOTE — Progress Notes (Signed)
Robert Mccann to be D/C'd Home per MD order.  Discussed prescriptions and follow up appointments with the patient. Prescriptions given to patient, medication list explained in detail. Pt verbalized understanding.  Allergies as of 06/30/2021   No Known Allergies      Medication List     STOP taking these medications    hydrochlorothiazide 12.5 MG capsule Commonly known as: MICROZIDE   sulfamethoxazole-trimethoprim 800-160 MG tablet Commonly known as: BACTRIM DS       TAKE these medications    apixaban 5 MG Tabs tablet Commonly known as: ELIQUIS Take 2 tablets (10 mg total) by mouth 2 (two) times daily. And then from 07/06/2021 take 1 tablet (5 mg) 2 times a daily   Biotin 1000 MCG tablet Take 1 tablet (1 mg total) by mouth daily. Chew 1 gummy daily. What changed:  medication strength how much to take   carvedilol 6.25 MG tablet Commonly known as: COREG Take 1 tablet (6.25 mg total) by mouth 2 (two) times daily with a meal.   dapagliflozin propanediol 10 MG Tabs tablet Commonly known as: FARXIGA Take 1 tablet (10 mg total) by mouth daily. Start taking on: July 01, 2021   furosemide 20 MG tablet Commonly known as: LASIX Take 1 tablet (20 mg total) by mouth daily. Start taking on: July 01, 2021   metFORMIN 500 MG tablet Commonly known as: GLUCOPHAGE Take 1 tablet (500 mg total) by mouth 2 (two) times daily.   ONE-A-DAY MENS PO Take 1 tablet by mouth daily.   sacubitril-valsartan 24-26 MG Commonly known as: ENTRESTO Take 1 tablet by mouth 2 (two) times daily.        Vitals:   06/30/21 0757 06/30/21 1120  BP: 112/81 101/68  Pulse: 89 97  Resp: 18 18  Temp: 98 F (36.7 C) 97.9 F (36.6 C)  SpO2: 97% 100%    Skin clean, dry and intact without evidence of skin break down, no evidence of skin tears noted. IV catheter discontinued intact. Site without signs and symptoms of complications. Dressing and pressure applied. Pt denies pain at this time. No  complaints noted.  An After Visit Summary was printed and given to the patient. Patient escorted via WC, and D/C home via private auto.  Rigoberto Noel

## 2021-07-04 LAB — FACTOR 5 LEIDEN

## 2021-07-05 LAB — PROTHROMBIN GENE MUTATION

## 2021-07-06 ENCOUNTER — Encounter: Payer: Self-pay | Admitting: Nurse Practitioner

## 2021-07-06 ENCOUNTER — Ambulatory Visit (INDEPENDENT_AMBULATORY_CARE_PROVIDER_SITE_OTHER): Payer: BC Managed Care – PPO | Admitting: Nurse Practitioner

## 2021-07-06 ENCOUNTER — Other Ambulatory Visit: Payer: Self-pay

## 2021-07-06 VITALS — BP 130/90 | HR 99 | Ht 71.0 in | Wt 338.0 lb

## 2021-07-06 DIAGNOSIS — G4730 Sleep apnea, unspecified: Secondary | ICD-10-CM

## 2021-07-06 DIAGNOSIS — I441 Atrioventricular block, second degree: Secondary | ICD-10-CM

## 2021-07-06 DIAGNOSIS — I2699 Other pulmonary embolism without acute cor pulmonale: Secondary | ICD-10-CM | POA: Diagnosis not present

## 2021-07-06 DIAGNOSIS — I420 Dilated cardiomyopathy: Secondary | ICD-10-CM

## 2021-07-06 DIAGNOSIS — I10 Essential (primary) hypertension: Secondary | ICD-10-CM

## 2021-07-06 DIAGNOSIS — E119 Type 2 diabetes mellitus without complications: Secondary | ICD-10-CM

## 2021-07-06 MED ORDER — SACUBITRIL-VALSARTAN 24-26 MG PO TABS: 1.0000 | ORAL_TABLET | Freq: Two times a day (BID) | ORAL | 3 refills | Status: DC

## 2021-07-06 NOTE — Patient Instructions (Signed)
Medication Instructions:  No changes at this time.  *If you need a refill on your cardiac medications before your next appointment, please call your pharmacy*   Lab Work: None  If you have labs (blood work) drawn today and your tests are completely normal, you will receive your results only by: MyChart Message (if you have MyChart) OR A paper copy in the mail If you have any lab test that is abnormal or we need to change your treatment, we will call you to review the results.   Testing/Procedures: WatchPAT?  Is a FDA cleared portable home sleep study test that uses a watch and 3 points of contact to monitor 7 different channels, including your heart rate, oxygen saturations, body position, snoring, and chest motion.  The study is easy to use from the comfort of your own home and accurately detect sleep apnea.  Before bed, you attach the chest sensor, attached the sleep apnea bracelet to your nondominant hand, and attach the finger probe.  After the study, the raw data is downloaded from the watch and scored for apnea events.   For more information: [email or web address]    Follow-Up: At [facility], you and your health needs are our priority.  As part of our continuing mission to provide you with exceptional heart care, we have created designated Provider Care Teams.  These Care Teams include your primary Cardiologist (physician) and Advanced Practice Providers (APPs -  Physician Assistants and Nurse Practitioners) who all work together to provide you with the care you need, when you need it.  We recommend signing up for the patient portal called "MyChart".  Sign up information is provided on this After Visit Summary.  MyChart is used to connect with patients for Virtual Visits (Telemedicine).  Patients are able to view lab/test results, encounter notes, upcoming appointments, etc.  Non-urgent messages can be sent to your provider as well.   To learn more about what  you can do with MyChart, go to [email or web address].    Your next appointment:   1 month(s)  The format for your next appointment:   In Person  Provider:   Debbe Odea, MD or Nicolasa Ducking, NP

## 2021-07-06 NOTE — Progress Notes (Addendum)
Office Visit    Patient Name: Robert Mccann Date of Encounter: 07/06/2021  Primary Care Provider:  Patient, No Pcp Per (Inactive) Primary Cardiologist:  Debbe Odea, MD  Chief Complaint    25 year old male with a history of hypertension, diabetes, and obesity, who presents for follow-up after recent hospitalization for pulmonary embolism with finding of cardiomyopathy (EF less than 20%), and intermittent Mobitz 2 heart block.  Past Medical History    Past Medical History:  Diagnosis Date   AV block, Mobitz 2    a. 05/2021 noted during periods of sleep @ time of hospitalization for PE.   Cardiomyopathy (HCC)    a. 05/2021 Echo: EF <20%, glob HK. [place]:C ratio of 2.1:1 suggesting non-compaction. Gr2 DD. Sev reduced RV fxn. Nl PASP. Mildly dil RA. Mild MR.   Diabetes mellitus without complication (HCC)    HFrEF (heart failure with reduced ejection fraction) (HCC)    a. 05/2021 Echo: EF <20%. ? non-compaction.   Hypertension    Pulmonary embolism (HCC)    a. 05/2021 CTA Chest: PE extending from lobar arteris of RUL and RLL w/ concern for pulml infarct-->s/p thrombolysis and thrombectomy of RUL/RML/RLL.   Past Surgical History:  Procedure Laterality Date   HAND SURGERY Left age 20   HAND SURGERY     PULMONARY THROMBECTOMY N/A 06/28/2021   Procedure: PULMONARY THROMBECTOMY;  Surgeon: Annice Needy, MD;  Location: [facility] INVASIVE CV LAB;  Service: Cardiovascular;  Laterality: N/A;    Allergies  No Known Allergies  History of Present Illness    25 year old male with the above past medical history including hypertension, diabetes, and obesity.  He presented to the emergency department on June 28 with complaints of shortness of breath and substernal chest pain.  CT of the chest revealed pulmonary artery emboli extending from the lobar arteries of the right upper and lower lobes and throughout their segmental branches.  He subsequently underwent thrombectomy and thrombolysis of the  right upper/middle/lower lobes.  He was initially treated with heparin and subsequently Eliquis.  Lower extremity ultrasound was negative for DVT, though it was noted that patient had previously spent 12 hours each way in a bus to [facility] and back about a week or 2 prior to admission.  In the setting of work-up for PE, echocardiogram was performed and showed an EF of less than 20% with global hypokinesis, grade 2 diastolic dysfunction, and severely reduced RV function.  There was concern for noncompaction with recommendation for CMR as an outpatient.  Patient was also noted to have intermittent Mobitz 2 heart block during periods of sleep with recommendation for outpatient sleep study.  He was placed on heart failure medications including low-dose carvedilol, Entresto, and Farxiga.  He was discharged home on July 1.  Since discharge, he has felt well.  He was not able to forward [place] or [place] and therefore has not been taking these.  He has tolerated carvedilol, Eliquis, and low-dose Lasix.  He denies chest pain, dyspnea, palpitations, PND, orthopnea, dizziness, syncope, edema, or early satiety.  He has noted occasional small amounts of blood on toilet paper after wiping.  He does not think that he has been straining and is unaware of any prior history of hemorrhoids.   Home Medications    Prior to Admission medications   Medication Sig Start Date End Date Taking? Authorizing Provider  apixaban (ELIQUIS) 5 MG TABS tablet Take 5 mg by mouth 2 (two) times daily.   Yes [provider]  Biotin 1000 MCG tablet Take 1 tablet (1 mg total) by mouth daily. Chew 1 gummy daily. 06/30/21  Yes Enedina Finner, MD  carvedilol (COREG) 6.25 MG tablet Take 1 tablet (6.25 mg total) by mouth 2 (two) times daily with a meal. 06/30/21  Yes Enedina Finner, MD  furosemide (LASIX) 20 MG tablet Take 1 tablet (20 mg total) by mouth daily. 07/01/21  Yes Enedina Finner, MD  metFORMIN (GLUCOPHAGE) 500 MG tablet Take 1 tablet  (500 mg total) by mouth 2 (two) times daily. 06/30/21  Yes Enedina Finner, MD  Multiple Vitamin (ONE-A-DAY MENS PO) Take 1 tablet by mouth daily.   Yes [provider]  sacubitril-valsartan (ENTRESTO) 24-26 MG Take 1 tablet by mouth 2 (two) times daily. 07/06/21   Creig Hines, NP      Review of Systems    Doing well post discharge.  He has noted occasional small amounts of blood on toilet paper after wiping.  He does not think that he has been straining and is unaware of any prior history of hemorrhoids.  He denies chest pain, palpitations, dyspnea, pnd, orthopnea, n, v, dizziness, syncope, edema, weight gain, or early satiety.  All other systems reviewed and are otherwise negative except as noted above.  Physical Exam    VS:  BP 130/90 (BP Location: Left Arm, Patient Position: Sitting, Cuff Size: Large)   Pulse 99   Ht 5\' 11"  (1.803 m)   Wt (!) 338 lb (153.3 kg)   SpO2 97%   BMI 47.14 kg/m  , BMI Body mass index is 47.14 kg/m. GEN: Obese, in no acute distress. HEENT: normal. Neck: Supple, no JVD, carotid bruits, or masses. Cardiac: RRR, no murmurs, rubs, or gallops. No clubbing, cyanosis, edema.  Radials/PT 2+ and equal bilaterally.  Respiratory:  Respirations regular and unlabored, clear to auscultation bilaterally. GI: Obese, soft, nontender, nondistended, BS + x 4. MS: no deformity or atrophy. Skin: warm and dry, no rash. Neuro:  Strength and sensation are intact. Psych: Normal affect.  Accessory Clinical Findings    ECG personally reviewed by me today -regular sinus rhythm, 99, left axis deviation, probable left atrial enlargement- no acute changes.  Lab Results  Component Value Date   WBC 8.4 06/29/2021   HGB 14.2 06/29/2021   HCT 41.0 06/29/2021   MCV 88.7 06/29/2021   PLT 287 06/29/2021   Lab Results  Component Value Date   CREATININE 0.86 06/28/2021   BUN 6 06/28/2021   NA 136 06/28/2021   K 3.9 06/28/2021   CL 101 06/28/2021   CO2 23  06/28/2021   Lab Results  Component Value Date   ALT 59 (H) 06/28/2021   AST 58 (H) 06/28/2021   ALKPHOS 84 06/28/2021   BILITOT 1.5 (H) 06/28/2021   Lab Results  Component Value Date   CHOL 149 06/28/2021   HDL 31 (L) 06/28/2021   LDLCALC 92 06/28/2021   TRIG 129 06/28/2021   CHOLHDL 4.8 06/28/2021    Lab Results  Component Value Date   HGBA1C 8.8 (H) 06/28/2021    Assessment & Plan    1.  Cardiomyopathy/HFrEF: Patient recently hospitalized with pulmonary embolism and found to have an EF of less than 20% with global hypokinesis, grade 2 diastolic dysfunction, and severely reduced RV function.  There was also concern for noncompaction.  He was discharged home on carvedilol, Farxiga, Lasix, and Entresto however, was not able to fill either the 01-04-2003 or the [place] due to cost.  He has been feeling well and is euvolemic on examination today.  We discussed the importance of afterload reduction and have provided him with a 30-day free card and co-pay reduction card for [facility].  Based on his response to this, we can consider MRA in the future, though he did not have blood pressure room for it in the hospital.  We will need to rule out ischemic coronary artery disease and will plan for coronary CTA in the future.  I suspect we can arrange this at his next clinic visit in 1 month.  In the setting of noncompaction noted on echo, he will also need cardiac MRI.  His upper body circumference is 57 inches and we will need to check if this is greater than the limit for our scanners.  We discussed the importance of daily weights, sodium restriction, medication compliance, and symptom reporting and he verbalizes understanding.   2.  Pulmonary embolism: History status post thrombectomy and thrombolysis on June 29 for pulmonary artery emboli extending from the lobar arteries of the right upper and lower lobe throughout the segmental branches.  No extremity ultrasound was negative for DVT though it is  notable that he had prolonged travel between [place] and [place] on a bus about a week or 2 prior to admission.  He has not been experiencing any dyspnea and has been tolerating Eliquis though has noted small amounts of blood on toilet paper from time to time.  He is unaware of a prior history of hemorrhoids.  3.  Essential hypertension: Blood pressure elevated today but he has yet to take his medications.  As noted above, he has not been taking an afterload reducing agent in the setting of not being able to afford Entresto.  We have provided him with the 30-day coupon and also co-pay reduction card for next year.  4.  Type 2 diabetes mellitus: A1c 8.8.  On metformin.  Not able to afford [place].  5.  Mobitz 2 heart block/?  Sleep apnea: Noted to have brief, intermittent spells of mobitz II heart block during periods of sleep during hospitalization.  No heart block during the day.  Pt does have a h/o snoring and body habitus concerning for development of OSA.  Will place WatchPat 1 today.  6.  Morbid Obesity:  we discussed the need to improve lifestyle habits/diet.   He was eating quite a bit of fast food, almost exclusively, prior to hospitalization.  7.  BRBPR:  Occasional, small amts of blood noted on toilet paper when wiping.  No large volume bleeding noted.  He has f/u w/ primary scheduled in the coming weeks.  He isn't aware of any prior h/o hemorrhoids.  He is aware that he should contact us if increased bleeding noted.  8.  Disposition:  F/u home sleep study.  F/u in 1 month.  Nicolasa Ducking, NP 07/06/2021, 5:03 PM

## 2021-07-07 ENCOUNTER — Telehealth: Payer: Self-pay | Admitting: *Deleted

## 2021-07-07 NOTE — Telephone Encounter (Signed)
-----   Message from Creig Hines, NP sent at 07/07/2021  8:39 AM EDT ----- Aram Beecham,  I spoke to [place] last night about the timing of this guys cardiac mri and restrictions based on chest circumference and weight.  He said the table weight maxes at 550 lbs and that we should be fine w/ his chest circumference.  I went ahead and put in an order for him.  I listed [facility] as preferred site, but if for some reason we can't do it there, than feel free to change it.  There's no hurry on getting it scheduled.  He has f/u w/ [facility] in a month.  Might be nice to have it done right before that f/u appt.  I did mention to the pt that we'd be looking into this and that we'd let him know about scheduling, so it shouldn't come as a surprise to him.  Thanks,  Thayer Ohm

## 2021-07-07 NOTE — Addendum Note (Signed)
Addended by: Ok Anis on: 07/07/2021 08:39 AM   Modules accepted: Orders

## 2021-07-07 NOTE — Telephone Encounter (Signed)
Spoke with patient and reviewed recommendations to have Cardiac MRI to be done. Advised patient that we will get clearance through his insurance and then someone will reach out to assist with scheduling this to be done. Requested that he please sign up for My Chart and then I can send him those instructions. He was agreeable with this and will get that done. He was appreciative for the call with no further questions at this time.

## 2021-07-11 ENCOUNTER — Encounter: Payer: Self-pay | Admitting: *Deleted

## 2021-07-11 ENCOUNTER — Other Ambulatory Visit: Payer: Self-pay

## 2021-07-11 ENCOUNTER — Encounter: Payer: BC Managed Care – PPO | Attending: Internal Medicine | Admitting: *Deleted

## 2021-07-11 VITALS — BP 110/68 | Ht 71.0 in | Wt 340.4 lb

## 2021-07-11 DIAGNOSIS — E119 Type 2 diabetes mellitus without complications: Secondary | ICD-10-CM | POA: Insufficient documentation

## 2021-07-11 NOTE — Patient Instructions (Addendum)
Check blood sugars 1 x day before breakfast or 2 hrs after one meal every day Bring blood sugar records to the next class  Call your doctor for a prescription for:  1. Meter strips (type)  Contour Next  checking  1 times per day  2. Lancets (type)  Contour Microlet checking  1     times per day  Exercise:  Begin walking  for    15  minutes   3  days a week and gradually increase to 30 minutes 5 x week  Eat 3 meals day,   2  snacks a day Space meals 4-6 hours apart Don't skip meals Allow 2-3 hours between meals and snacks Avoid sugar sweetened drinks (soda, tea, sports drinks, juices)  Make an eye doctor appointment

## 2021-07-11 NOTE — Progress Notes (Signed)
Diabetes Self-Management Education  Visit Type: First/Initial  Appt. Start Time: 0915 Appt. End Time: 1030  07/11/2021  [name], identified by name and date of birth, is a 25 y.o. male with a diagnosis of Diabetes: Type 2.   ASSESSMENT  Blood pressure 110/68, height 5\' 11"  (1.803 m), weight (!) 340 lb 6.4 oz (154.4 kg). Body mass index is 47.48 kg/m.   Diabetes Self-Management Education - 07/11/21 1333       Visit Information   Visit Type First/Initial      Initial Visit   Diabetes Type Type 2    Are you currently following a meal plan? No    Are you taking your medications as prescribed? Yes    Date Diagnosed 2 years      Health Coping   How would you rate your overall health? Poor      Psychosocial Assessment   Patient Belief/Attitude about Diabetes Motivated to manage diabetes    Self-care barriers None    Self-management support Doctor's office    Patient Concerns Nutrition/Meal planning;Glycemic Control;Monitoring;Weight Control    Special Needs None    Preferred Learning Style Auditory;Hands on    Learning Readiness Ready    How often do you need to have someone help you when you read instructions, pamphlets, or other written materials from your doctor or pharmacy? 1 - Never    What is the last grade level you completed in school? high school      Pre-Education Assessment   Patient understands the diabetes disease and treatment process. Needs Instruction    Patient understands incorporating nutritional management into lifestyle. Needs Instruction    Patient undertands incorporating physical activity into lifestyle. Needs Instruction    Patient understands using medications safely. Needs Review    Patient understands monitoring blood glucose, interpreting and using results Needs Instruction    Patient understands prevention, detection, and treatment of acute complications. Needs Instruction    Patient understands prevention, detection, and treatment  of chronic complications. Needs Review    Patient understands how to develop strategies to address psychosocial issues. Needs Instruction    Patient understands how to develop strategies to promote health/change behavior. Needs Instruction      Complications   Last HgB A1C per patient/outside source 8.8 %   06/28/2021   How often do you check your blood sugar? 0 times/day (not testing)   Provided Contour Next One meter and instructed on use. BG upon return demonstration was 153 mg/dL at 06/30/2021 am - fasting.   Have you had a dilated eye exam in the past 12 months? No    Have you had a dental exam in the past 12 months? No    Are you checking your feet? No      Dietary Intake   Breakfast skips    Snack (morning) reports having 3-4 snacks/day - chips, peanuts, fruit (raisins, grapes, berries, bananas), Ritz bites with cheese, crackers, smoothie with 76:16, eggs, chips, rarely sandwich    Dinner chicken, beef, pork, fish; potatoes, peas, beans, corn, rice, green beans, squash, greens, salad with lettuce, cheese, eggs, crotons    Beverage(s) water, milk in smoothie, fruit juice, sports drinks      Exercise   Exercise Type ADL's      Patient Education   Previous Diabetes Education No    Disease state  Definition of diabetes, type 1 and 2, and the diagnosis of diabetes;Factors that contribute to the development  of diabetes    Nutrition management  Role of diet in the treatment of diabetes and the relationship between the three main macronutrients and blood glucose level;Food label reading, portion sizes and measuring food.;Reviewed blood glucose goals for pre and post meals and how to evaluate the patients' food intake on their blood glucose level.    Physical activity and exercise  Role of exercise on diabetes management, blood pressure control and cardiac health.    Medications Reviewed patients medication for diabetes, action, purpose, timing of dose and side effects.     Monitoring Taught/evaluated SMBG meter.;Purpose and frequency of SMBG.;Taught/discussed recording of test results and interpretation of SMBG.;Identified appropriate SMBG and/or A1C goals.;Yearly dilated eye exam    Chronic complications Relationship between chronic complications and blood glucose control;Lipid levels, blood glucose control and heart disease    Psychosocial adjustment Identified and addressed patients feelings and concerns about diabetes      Individualized Goals (developed by patient)   Reducing Risk Other (comment)   improve blood sugars, prevent diabetes complications, lose weight     Outcomes   Expected Outcomes Demonstrated interest in learning. Expect positive outcomes    Future DMSE 4-6 wks             Individualized Plan for Diabetes Self-Management Training:   Learning Objective:  Patient will have a greater understanding of diabetes self-management. Patient education plan is to attend individual and/or group sessions per assessed needs and concerns.   Plan:   Patient Instructions  Check blood sugars 1 x day before breakfast or 2 hrs after one meal every day Bring blood sugar records to the next class  Call your doctor for a prescription for:  1. Meter strips (type)  Contour Next  checking  1 times per day  2. Lancets (type)  Contour Microlet checking  1     times per day  Exercise:  Begin walking  for    15  minutes   3  days a week and gradually increase to 30 minutes 5 x week  Eat 3 meals day,   2  snacks a day Space meals 4-6 hours apart Don't skip meals Allow 2-3 hours between meals and snacks Avoid sugar sweetened drinks (soda, tea, sports drinks, juices)  Make an eye doctor appointment  Expected Outcomes:  Demonstrated interest in learning. Expect positive outcomes  Education material provided: General Meal Planning Guidelines Simple Meal Plan Meter - Contour Next One  If problems or questions, patient to contact team via:  Sharion Settler, RN, CCM, CDCES [phone]  Future DSME appointment: 4-6 wks August 10, 2021 for Diabetes Class 1

## 2021-07-13 NOTE — Telephone Encounter (Signed)
I don't want his f/u to go too far out, so I think it's best that he keep the 8/11 follow-up.  Thanks.

## 2021-07-14 NOTE — Telephone Encounter (Signed)
Left voicemail message to call back if questions about test instructions.

## 2021-07-17 ENCOUNTER — Encounter (INDEPENDENT_AMBULATORY_CARE_PROVIDER_SITE_OTHER): Payer: Self-pay | Admitting: Nurse Practitioner

## 2021-07-17 ENCOUNTER — Ambulatory Visit (INDEPENDENT_AMBULATORY_CARE_PROVIDER_SITE_OTHER): Payer: BC Managed Care – PPO | Admitting: Nurse Practitioner

## 2021-07-17 ENCOUNTER — Other Ambulatory Visit: Payer: Self-pay

## 2021-07-17 VITALS — BP 123/85 | HR 96 | Resp 18 | Ht 71.0 in | Wt 334.0 lb

## 2021-07-17 DIAGNOSIS — I5020 Unspecified systolic (congestive) heart failure: Secondary | ICD-10-CM

## 2021-07-17 DIAGNOSIS — I2609 Other pulmonary embolism with acute cor pulmonale: Secondary | ICD-10-CM

## 2021-07-17 DIAGNOSIS — I10 Essential (primary) hypertension: Secondary | ICD-10-CM | POA: Diagnosis not present

## 2021-07-17 NOTE — Progress Notes (Signed)
Subjective:    Patient ID: Robert Mccann, male    DOB: 1996-07-15, 25 y.o.   MRN: [id number] Chief Complaint  Patient presents with   Follow-up    2 wk [facility] f/u no studies    Robert Mccann is a 25 year old male that presents today for follow-up evaluation after recent pulmonary embolectomy on 06/28/2021 including  Procedure(s) Performed:             1.  Contrast injection right heart             2.  Thrombolysis 6 mg of tPA to the right pulmonary arteries             3.  Mechanical thrombectomy right lower lobe, right middle lobe, and right upper lobe pulmonary arteries using the penumbra CAT 8 device             4.  Selective catheter placement right lower lobe, middle lobe, and upper lobe pulmonary arteries             5.  Selective catheter placement left main pulmonary artery               The patient was initially asked to be seen due to significant pulmonary embolism with markedly dilated heart concerning for heart strain.  It was initially found that the patient also has underlying cardiomyopathy.  He presented with chest pain, shortness of breath and tachycardia.  Today much of this has resolved.  He denies any extensive shortness of breath with activity.  He notes that a week or so ago he had some chest discomfort but that has all resolved.  The patient has been on Eliquis since he has left the hospital.  Bilateral DVT studies were done and found no evidence of deep vein thrombosis to account for the thrombus.  Echocardiogram at that time noted an ejection fraction of less than 20%.  The patient notes that previously he had palpitations previously.      Review of Systems  Respiratory:  Negative for chest tightness and shortness of breath.   Cardiovascular:  Positive for palpitations.  All other systems reviewed and are negative.     Objective:   Physical Exam Vitals reviewed.  HENT:     Head: Normocephalic.  Cardiovascular:     Rate and Rhythm: Normal rate.      Heart sounds: Normal heart sounds.  Pulmonary:     Effort: Pulmonary effort is normal.  Musculoskeletal:     Right lower leg: No edema.     Left lower leg: No edema.  Neurological:     Mental Status: He is alert and oriented to person, place, and time.  Psychiatric:        Mood and Affect: Mood normal.        Behavior: Behavior normal.        Thought Content: Thought content normal.        Judgment: Judgment normal.    BP 123/85 (BP Location: Right Arm)   Pulse 96   Resp 18   Ht 5\' 11"  (1.803 m)   Wt (!) 334 lb (151.5 kg)   BMI 46.58 kg/m   Past Medical History:  Diagnosis Date   AV block, Mobitz 2    a. 05/2021 noted during periods of sleep @ time of hospitalization for PE.   Cardiomyopathy (HCC)    a. 05/2021 Echo: EF <20%, glob HK. [place]:C ratio of 2.1:1 suggesting non-compaction. Gr2 DD. Sev reduced RV fxn. Nl  PASP. Mildly dil RA. Mild MR.   Diabetes mellitus without complication (HCC)    HFrEF (heart failure with reduced ejection fraction) (HCC)    a. 05/2021 Echo: EF <20%. ? non-compaction.   Hypertension    Pulmonary embolism (HCC)    a. 05/2021 CTA Chest: PE extending from lobar arteris of RUL and RLL w/ concern for pulml infarct-->s/p thrombolysis and thrombectomy of RUL/RML/RLL.    Social History   Socioeconomic History   Marital status: Single    Spouse name: Not on file   Number of children: Not on file   Years of education: Not on file   Highest education level: Not on file  Occupational History   Not on file  Tobacco Use   Smoking status: Never   Smokeless tobacco: Never  Vaping Use   Vaping Use: Never used  Substance and Sexual Activity   Alcohol use: Not Currently    Comment: special occasions   Drug use: Yes    Types: Marijuana    Comment: 6 days ago   Sexual activity: Not on file  Other Topics Concern   Not on file  Social History Narrative   Not on file   Social Determinants of Health   Financial Resource Strain: Not on file  Food  Insecurity: Not on file  Transportation Needs: Not on file  Physical Activity: Not on file  Stress: Not on file  Social Connections: Not on file  Intimate Partner Violence: Not on file    Past Surgical History:  Procedure Laterality Date   HAND SURGERY Left age 59   HAND SURGERY     PULMONARY THROMBECTOMY N/A 06/28/2021   Procedure: PULMONARY THROMBECTOMY;  Surgeon: Annice Needy, MD;  Location: [facility] INVASIVE CV LAB;  Service: Cardiovascular;  Laterality: N/A;    Family History  Problem Relation Age of Onset   Heart failure Mother    Diabetes Father    Hypertension Father     No Known Allergies  CBC Latest Ref Rng & Units 06/29/2021 06/28/2021 06/27/2021  WBC 4.0 - 10.5 K/uL 8.4 8.9 8.1  Hemoglobin 13.0 - 17.0 g/dL 32.4 40.1 02.7  Hematocrit 39.0 - 52.0 % 41.0 41.6 43.6  Platelets 150 - 400 K/uL 287 267 278      CMP     Component Value Date/Time   NA 136 06/28/2021 0704   K 3.9 06/28/2021 0704   CL 101 06/28/2021 0704   CO2 23 06/28/2021 0704   GLUCOSE 227 (H) 06/28/2021 0704   BUN 6 06/28/2021 0704   CREATININE 0.86 06/28/2021 0704   CALCIUM 8.8 (L) 06/28/2021 0704   PROT 6.9 06/28/2021 0704   ALBUMIN 3.3 (L) 06/28/2021 0704   AST 58 (H) 06/28/2021 0704   ALT 59 (H) 06/28/2021 0704   ALKPHOS 84 06/28/2021 0704   BILITOT 1.5 (H) 06/28/2021 0704   GFRNONAA >60 06/28/2021 0704     No results found.     Assessment & Plan:   1. Acute pulmonary embolism with acute cor pulmonale, unspecified pulmonary embolism type (HCC) Currently the patient is on Eliquis.  He is advised that he should plan on being at anticoagulation for at least 1 year.  The patient symptoms have greatly improved.  We discussed that the role for follow-up imaging is if his symptoms sent to worsen or not improve over time.  We discussed the difference between anticoagulation and cost.  He currently has a 30-day card and he was given a 10 Co-pay card by  me today.  Based off of his new insurance we  are hopeful that this will help with much of the cost however if it becomes unaffordable we may need to transition to Coumadin.  Patient is advised to contact us if he is in need of refills.  Otherwise we will see the patient on an as-needed basis.  2. HFrEF (heart failure with reduced ejection fraction) (HCC) It is possible that the patient's emboli has some sort of cardiac source.  The patient did have a type II moments during the hospitalization.  The patient's palpitations may indicate possible paroxysmal atrial fibrillation.  He has a follow-up with cardiology in the next month.  Patient will continue to follow with cardiology for possible sources of embolic event as well as his heart failure.  3. Primary hypertension Continue antihypertensive medications as already ordered, these medications have been reviewed and there are no changes at this time.    Current Outpatient Medications on File Prior to Visit  Medication Sig Dispense Refill   apixaban (ELIQUIS) 5 MG TABS tablet Take 5 mg by mouth 2 (two) times daily.     Biotin 1000 MCG tablet Take 1 tablet (1 mg total) by mouth daily. Chew 1 gummy daily. 30 tablet 3   carvedilol (COREG) 6.25 MG tablet Take 1 tablet (6.25 mg total) by mouth 2 (two) times daily with a meal. 60 tablet 3   furosemide (LASIX) 20 MG tablet Take 1 tablet (20 mg total) by mouth daily. 30 tablet 3   metFORMIN (GLUCOPHAGE) 500 MG tablet Take 1 tablet (500 mg total) by mouth 2 (two) times daily. 60 tablet 3   Multiple Vitamin (ONE-A-DAY MENS PO) Take 1 tablet by mouth daily.     sacubitril-valsartan (ENTRESTO) 24-26 MG Take 1 tablet by mouth 2 (two) times daily. 180 tablet 3   No current facility-administered medications on file prior to visit.    There are no Patient Instructions on file for this visit. No follow-ups on file.   Georgiana Spinner, NP

## 2021-07-18 ENCOUNTER — Emergency Department (HOSPITAL_BASED_OUTPATIENT_CLINIC_OR_DEPARTMENT_OTHER)
Admission: EM | Admit: 2021-07-18 | Discharge: 2021-07-18 | Disposition: A | Discharge status: Home / Self Care | Payer: BC Managed Care – PPO | Attending: Emergency Medicine | Admitting: Emergency Medicine

## 2021-07-18 ENCOUNTER — Other Ambulatory Visit: Payer: Self-pay

## 2021-07-18 ENCOUNTER — Encounter (HOSPITAL_BASED_OUTPATIENT_CLINIC_OR_DEPARTMENT_OTHER): Payer: Self-pay | Admitting: *Deleted

## 2021-07-18 DIAGNOSIS — I110 Hypertensive heart disease with heart failure: Secondary | ICD-10-CM | POA: Diagnosis not present

## 2021-07-18 DIAGNOSIS — Z20822 Contact with and (suspected) exposure to covid-19: Secondary | ICD-10-CM | POA: Insufficient documentation

## 2021-07-18 DIAGNOSIS — Z0279 Encounter for issue of other medical certificate: Secondary | ICD-10-CM | POA: Diagnosis not present

## 2021-07-18 DIAGNOSIS — Z7984 Long term (current) use of oral hypoglycemic drugs: Secondary | ICD-10-CM | POA: Insufficient documentation

## 2021-07-18 DIAGNOSIS — I509 Heart failure, unspecified: Secondary | ICD-10-CM | POA: Diagnosis not present

## 2021-07-18 DIAGNOSIS — R059 Cough, unspecified: Secondary | ICD-10-CM | POA: Diagnosis not present

## 2021-07-18 DIAGNOSIS — M791 Myalgia, unspecified site: Secondary | ICD-10-CM | POA: Diagnosis not present

## 2021-07-18 DIAGNOSIS — J029 Acute pharyngitis, unspecified: Secondary | ICD-10-CM | POA: Diagnosis not present

## 2021-07-18 DIAGNOSIS — E119 Type 2 diabetes mellitus without complications: Secondary | ICD-10-CM | POA: Insufficient documentation

## 2021-07-18 DIAGNOSIS — Z79899 Other long term (current) drug therapy: Secondary | ICD-10-CM | POA: Diagnosis not present

## 2021-07-18 DIAGNOSIS — B349 Viral infection, unspecified: Secondary | ICD-10-CM

## 2021-07-18 DIAGNOSIS — Z7901 Long term (current) use of anticoagulants: Secondary | ICD-10-CM | POA: Diagnosis not present

## 2021-07-18 LAB — SARS CORONAVIRUS 2 (TAT 6-24 HRS): SARS Coronavirus 2: NEGATIVE

## 2021-07-18 MED ORDER — BENZONATATE 100 MG PO CAPS: 100.0000 mg | ORAL_CAPSULE | Freq: Three times a day (TID) | ORAL | 0 refills | Status: DC

## 2021-07-18 NOTE — Discharge Instructions (Signed)
Your COVID test is pending, it will result within the next 24 hours on MyChart.  You test positive, please abstain from going back to work for 5 more days.  After 5 days he can go back to work without a mask given your COVID vaccinated.  He can take the [facility] as needed up to every 8 hours for coughing.  Take Tylenol as needed for body aches and chills.  He can also continue to drink warm teas for the sore throat.  Please do not continue to drink liquor as that will not help the COVID get better.

## 2021-07-18 NOTE — ED Provider Notes (Signed)
[facility] EMERGENCY DEPARTMENT Provider Note   CSN: [id number] Arrival date & time: 07/18/21  1347     History Chief Complaint  Patient presents with   Covid Exposure    Robert Mccann is a 25 y.o. male.  HPI  Patient reports with COVID expect those are 4 days ago.  He is having cough, sore throat, body aches.  He is COVID vaccinated and boosted.  He does not have any difficulty keeping fluids down.  States he is here to get a work note and to get tested for COVID.  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 05/2021 noted during periods of sleep @ time of hospitalization for PE.   Cardiomyopathy (HCC)    a. 05/2021 Echo: EF <20%, glob HK. [place]:C ratio of 2.1:1 suggesting non-compaction. Gr2 DD. Sev reduced RV fxn. Nl PASP. Mildly dil RA. Mild MR.   Diabetes mellitus without complication (HCC)    HFrEF (heart failure with reduced ejection fraction) (HCC)    a. 05/2021 Echo: EF <20%. ? non-compaction.   Hypertension    Pulmonary embolism (HCC)    a. 05/2021 CTA Chest: PE extending from lobar arteris of RUL and RLL w/ concern for pulml infarct-->s/p thrombolysis and thrombectomy of RUL/RML/RLL.    Patient Active Problem List   Diagnosis Date Noted   PE (pulmonary thromboembolism) (HCC) 06/28/2021   HFrEF (heart failure with reduced ejection fraction) (HCC)    Acute pulmonary embolism with acute cor pulmonale (HCC)    Obesity, Class III, BMI 40-49.9 (morbid obesity) (HCC)    DM II (diabetes mellitus, type II), controlled (HCC) 06/27/2021   Chest pain 06/27/2021   HTN (hypertension) 06/27/2021   Malaise 05/25/2014   Routine history and physical examination of adult 05/25/2014   Obesity 05/25/2014    Past Surgical History:  Procedure Laterality Date   HAND SURGERY Left age 35   HAND SURGERY     PULMONARY THROMBECTOMY N/A 06/28/2021   Procedure: PULMONARY THROMBECTOMY;  Surgeon: Annice Needy, MD;  Location: [facility] INVASIVE CV LAB;  Service:  Cardiovascular;  Laterality: N/A;       Family History  Problem Relation Age of Onset   Heart failure Mother    Diabetes Father    Hypertension Father     Social History   Tobacco Use   Smoking status: Never   Smokeless tobacco: Never  Vaping Use   Vaping Use: Never used  Substance Use Topics   Alcohol use: Not Currently    Comment: special occasions   Drug use: Yes    Types: Marijuana    Comment: 6 days ago    Home Medications Prior to Admission medications   Medication Sig Start Date End Date Taking? Authorizing Provider  apixaban (ELIQUIS) 5 MG TABS tablet Take 5 mg by mouth 2 (two) times daily.    [provider]  Biotin 1000 MCG tablet Take 1 tablet (1 mg total) by mouth daily. Chew 1 gummy daily. 06/30/21   Enedina Finner, MD  carvedilol (COREG) 6.25 MG tablet Take 1 tablet (6.25 mg total) by mouth 2 (two) times daily with a meal. 06/30/21   Enedina Finner, MD  furosemide (LASIX) 20 MG tablet Take 1 tablet (20 mg total) by mouth daily. 07/01/21   Enedina Finner, MD  metFORMIN (GLUCOPHAGE) 500 MG tablet Take 1 tablet (500 mg total) by mouth 2 (two) times daily. 06/30/21   Enedina Finner, MD  Multiple Vitamin (ONE-A-DAY  MENS PO) Take 1 tablet by mouth daily.    [provider]  sacubitril-valsartan (ENTRESTO) 24-26 MG Take 1 tablet by mouth 2 (two) times daily. 07/06/21   Creig Hines, NP    Allergies    Patient has no known allergies.  Review of Systems   Review of Systems  Constitutional:  Positive for fever.  HENT:  Positive for sore throat.   Respiratory:  Positive for cough.   Musculoskeletal:  Positive for myalgias.   Physical Exam Updated Vital Signs BP 118/74   Pulse 92   Temp 98.6 F (37 C) (Oral)   Resp 16   Ht 5\' 11"  (1.803 m)   Wt (!) 150.1 kg   SpO2 99%   BMI 46.17 kg/m   Physical Exam Vitals and nursing note reviewed. Exam conducted with a chaperone present.  Constitutional:      Appearance: Normal appearance.  HENT:      Head: Normocephalic and atraumatic.  Eyes:     General: No scleral icterus.       Right eye: No discharge.        Left eye: No discharge.     Extraocular Movements: Extraocular movements intact.     Pupils: Pupils are equal, round, and reactive to light.  Cardiovascular:     Rate and Rhythm: Normal rate and regular rhythm.     Pulses: Normal pulses.     Heart sounds: Normal heart sounds. No murmur heard.   No friction rub. No gallop.  Pulmonary:     Effort: Pulmonary effort is normal. No respiratory distress.     Breath sounds: Normal breath sounds.  Abdominal:     General: Abdomen is flat. Bowel sounds are normal. There is no distension.     Palpations: Abdomen is soft.     Tenderness: There is no abdominal tenderness.  Skin:    General: Skin is warm and dry.     Coloration: Skin is not jaundiced.  Neurological:     Mental Status: He is alert. Mental status is at baseline.     Coordination: Coordination normal.    ED Results / Procedures / Treatments   Labs (all labs ordered are listed, but only abnormal results are displayed) Labs Reviewed  SARS CORONAVIRUS 2 (TAT 6-24 HRS)    EKG None  Radiology No results found.  Procedures Procedures   Medications Ordered in ED Medications - No data to display  ED Course  I have reviewed the triage vital signs and the nursing notes.  Pertinent labs & imaging results that were available during my care of the patient were reviewed by me and considered in my medical decision making (see chart for details).    MDM Rules/Calculators/A&P                          Patient is well-appearing with stable vitals.  He is COVID vaccinated and boosted, will check for COVID status given his recent exposure.  Discussed Paxilovid with the patient and he does not wish to pursue that option.  Advised him to take Tylenol and Tessalon Perles as needed.  He will check on the COVID test will return to work after 5 days if positive.  Return  precautions discussed with the patient. Final Clinical Impression(s) / ED Diagnoses Final diagnoses:  None    Rx / DC Orders ED Discharge Orders     None        ,  PA-C 07/18/21 1455    CuratoloMadelaine Bhat, DO 07/18/21 1526

## 2021-07-18 NOTE — ED Triage Notes (Signed)
Covid exposure with sx x 4 days

## 2021-08-10 ENCOUNTER — Ambulatory Visit: Payer: BC Managed Care – PPO | Admitting: Cardiology

## 2021-08-11 ENCOUNTER — Encounter: Payer: Self-pay | Admitting: Cardiology

## 2021-08-17 ENCOUNTER — Telehealth (HOSPITAL_COMMUNITY): Payer: Self-pay | Admitting: *Deleted

## 2021-08-17 NOTE — Telephone Encounter (Signed)
Attempted to call patient regarding upcoming cardiac MRI appointment. Left message on voicemail with name and callback number  Strother Everitt RN Navigator Cardiac Imaging [facility] [phone] Office [phone] Cell  

## 2021-08-18 ENCOUNTER — Ambulatory Visit (HOSPITAL_COMMUNITY): Admission: RE | Admit: 2021-08-18 | Payer: BC Managed Care – PPO | Source: Ambulatory Visit

## 2021-09-06 ENCOUNTER — Other Ambulatory Visit
Admission: RE | Admit: 2021-09-06 | Discharge: 2021-09-06 | Disposition: A | Discharge status: Home / Self Care | Payer: BC Managed Care – PPO | Source: Ambulatory Visit | Attending: Infectious Diseases | Admitting: Infectious Diseases

## 2021-09-06 DIAGNOSIS — E119 Type 2 diabetes mellitus without complications: Secondary | ICD-10-CM | POA: Insufficient documentation

## 2021-09-06 DIAGNOSIS — I2609 Other pulmonary embolism with acute cor pulmonale: Secondary | ICD-10-CM | POA: Insufficient documentation

## 2021-09-06 DIAGNOSIS — I5020 Unspecified systolic (congestive) heart failure: Secondary | ICD-10-CM | POA: Insufficient documentation

## 2021-09-06 LAB — BRAIN NATRIURETIC PEPTIDE: B Natriuretic Peptide: 367.6 pg/mL — ABNORMAL HIGH (ref 0.0–100.0)

## 2021-09-07 ENCOUNTER — Ambulatory Visit: Payer: BC Managed Care – PPO

## 2021-09-07 ENCOUNTER — Telehealth: Payer: Self-pay

## 2021-09-07 NOTE — Telephone Encounter (Signed)
Patient is requesting another 30 day trial of Eliquis. Please call to discuss

## 2021-09-07 NOTE — Telephone Encounter (Signed)
Pt's age 25, wt 150.15 kg, SCr 1.1, last ov w/ Ward Givens, NP  07/06/21, pt is on appropriate dosage of Eliquis 5 mg BID. There is no documentation that our office has provided pt w/ samples, as he was on Eliquis prior to his first visit (no previous prescriber noted). Per pharmacy, pt's ins does not have a donut hole. Therefore, pt should be offered a copay card instead of samples.

## 2021-09-08 NOTE — Telephone Encounter (Signed)
Spoke with Robert Mccann regarding Eliquis samples, I explained to him per the pharmacy, pt's insurance does not have a donut hole. Therefore, I told the patient would have a copay card that he could pick up instead of samples.  The patient stated, he has a copay card that he will take to the pharmacy.

## 2021-09-14 ENCOUNTER — Ambulatory Visit: Payer: BC Managed Care – PPO

## 2021-09-15 ENCOUNTER — Other Ambulatory Visit: Payer: Self-pay

## 2021-09-15 ENCOUNTER — Ambulatory Visit (INDEPENDENT_AMBULATORY_CARE_PROVIDER_SITE_OTHER): Payer: BC Managed Care – PPO | Admitting: Cardiology

## 2021-09-15 ENCOUNTER — Encounter: Payer: Self-pay | Admitting: Cardiology

## 2021-09-15 VITALS — BP 112/80 | HR 87 | Ht 71.0 in | Wt 339.0 lb

## 2021-09-15 DIAGNOSIS — I2699 Other pulmonary embolism without acute cor pulmonale: Secondary | ICD-10-CM

## 2021-09-15 DIAGNOSIS — I5020 Unspecified systolic (congestive) heart failure: Secondary | ICD-10-CM | POA: Diagnosis not present

## 2021-09-15 DIAGNOSIS — I10 Essential (primary) hypertension: Secondary | ICD-10-CM

## 2021-09-15 MED ORDER — SPIRONOLACTONE 25 MG PO TABS: 12.5000 mg | ORAL_TABLET | Freq: Every day | ORAL | 3 refills | Status: DC

## 2021-09-15 MED ORDER — TORSEMIDE 20 MG PO TABS: 40.0000 mg | ORAL_TABLET | Freq: Two times a day (BID) | ORAL | 2 refills | Status: DC

## 2021-09-15 NOTE — Patient Instructions (Addendum)
Medication Instructions:   Your physician has recommended you make the following change in your medication:    STOP taking your Lasix.  2.    START taking Torsemide 40 MG once a day.  3.  START taking Spironolactone (Aldactone) 12.5 MG once a day.  *If you need a refill on your cardiac medications before your next appointment, please call your pharmacy*   Lab Work:  Your physician recommends that you return for lab (BMP) work in: 2 weeks.  Please come to our office on _________________at________________am/pm   If you have labs (blood work) drawn today and your tests are completely normal, you will receive your results only by: MyChart Message (if you have MyChart) OR A paper copy in the mail If you have any lab test that is abnormal or we need to change your treatment, we will call you to review the results.   Testing/Procedures: None ordered   Follow-Up: At [facility], you and your health needs are our priority.  As part of our continuing mission to provide you with exceptional heart care, we have created designated Provider Care Teams.  These Care Teams include your primary Cardiologist (physician) and Advanced Practice Providers (APPs -  Physician Assistants and Nurse Practitioners) who all work together to provide you with the care you need, when you need it.  We recommend signing up for the patient portal called "MyChart".  Sign up information is provided on this After Visit Summary.  MyChart is used to connect with patients for Virtual Visits (Telemedicine).  Patients are able to view lab/test results, encounter notes, upcoming appointments, etc.  Non-urgent messages can be sent to your provider as well.   To learn more about what you can do with MyChart, go to [email or web address].    Your next appointment:   6 week(s)  The format for your next appointment:   In Person  Provider:   Debbe Odea, MD  ONLY   Other Instructions

## 2021-09-15 NOTE — Progress Notes (Signed)
Cardiology Office Note:    Date:  09/15/2021   ID:  Robert Mccann, DOB February 27, 1996, MRN [id number]  PCP:  Patient, No Pcp Per (Inactive)   [facility] Providers Cardiologist:  Debbe Odea, MD     Referring MD: No ref. provider found   Chief Complaint  Patient presents with   Other    1 month follow up -- patient c.o chest pain and SOB when walking up stairs and when lying down. Patient also c/o wheezing for the past week or two. Patient states he was 331lb at his last OV with PCP on 09/07. Meds reviewed verbally with patient.     History of Present Illness:    Robert Mccann is a 25 y.o. male with a hx of HFrEF EF <20%, hypertension, diabetes, PE, obesity who presents for follow-up.  Patient initially seen by myself in the hospital due to shortness of breath.  Echocardiogram showed severely reduced ejection fraction, EF less than 20%.  Evidence of noncompaction noted.  States taking medications as prescribed, has noticed some weight gain associated with wheezing and shortness of breath.  Lasix dose was increased to 60 mg daily without good effect.  Denies chest pain, MRI not scheduled due to patient's size.  Prior notes Echo 05/2021 EF less than 20%, evidence of noncompaction. Mother has a history of ICD placement.  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 05/2021 noted during periods of sleep @ time of hospitalization for PE.   Cardiomyopathy (HCC)    a. 05/2021 Echo: EF <20%, glob HK. [place]:C ratio of 2.1:1 suggesting non-compaction. Gr2 DD. Sev reduced RV fxn. Nl PASP. Mildly dil RA. Mild MR.   Diabetes mellitus without complication (HCC)    HFrEF (heart failure with reduced ejection fraction) (HCC)    a. 05/2021 Echo: EF <20%. ? non-compaction.   Hypertension    Pulmonary embolism (HCC)    a. 05/2021 CTA Chest: PE extending from lobar arteris of RUL and RLL w/ concern for pulml infarct-->s/p thrombolysis and thrombectomy of RUL/RML/RLL.     Past Surgical History:  Procedure Laterality Date   HAND SURGERY Left age 57   HAND SURGERY     PULMONARY THROMBECTOMY N/A 06/28/2021   Procedure: PULMONARY THROMBECTOMY;  Surgeon: Annice Needy, MD;  Location: [facility] INVASIVE CV LAB;  Service: Cardiovascular;  Laterality: N/A;    Current Medications: Current Meds  Medication Sig   apixaban (ELIQUIS) 5 MG TABS tablet Take 5 mg by mouth 2 (two) times daily.   benzonatate (TESSALON) 100 MG capsule Take 1 capsule (100 mg total) by mouth every 8 (eight) hours.   Biotin 1000 MCG tablet Take 1 tablet (1 mg total) by mouth daily. Chew 1 gummy daily.   carvedilol (COREG) 6.25 MG tablet Take 1 tablet (6.25 mg total) by mouth 2 (two) times daily with a meal.   metFORMIN (GLUCOPHAGE) 500 MG tablet Take 1 tablet (500 mg total) by mouth 2 (two) times daily.   Multiple Vitamin (ONE-A-DAY MENS PO) Take 1 tablet by mouth daily.   sacubitril-valsartan (ENTRESTO) 24-26 MG Take 1 tablet by mouth 2 (two) times daily.   spironolactone (ALDACTONE) 25 MG tablet Take 0.5 tablets (12.5 mg total) by mouth daily.   torsemide (DEMADEX) 20 MG tablet Take 2 tablets (40 mg total) by mouth 2 (two) times daily.   [DISCONTINUED] furosemide (LASIX) 20 MG tablet Take 40 mg by mouth.     Allergies:   Patient has  no known allergies.   Social History   Socioeconomic History   Marital status: Single    Spouse name: Not on file   Number of children: Not on file   Years of education: Not on file   Highest education level: Not on file  Occupational History   Not on file  Tobacco Use   Smoking status: Never   Smokeless tobacco: Never  Vaping Use   Vaping Use: Never used  Substance and Sexual Activity   Alcohol use: Not Currently    Comment: special occasions   Drug use: Yes    Types: Marijuana    Comment: 6 days ago   Sexual activity: Not on file  Other Topics Concern   Not on file  Social History Narrative   Not on file   Social Determinants of Health    Financial Resource Strain: Not on file  Food Insecurity: Not on file  Transportation Needs: Not on file  Physical Activity: Not on file  Stress: Not on file  Social Connections: Not on file     Family History: The patient's family history includes Diabetes in his father; Heart failure in his mother; Hypertension in his father.  ROS:   Please see the history of present illness.     All other systems reviewed and are negative.  EKGs/Labs/Other Studies Reviewed:    The following studies were reviewed today:   EKG:  EKG is  ordered today.  The ekg ordered today demonstrates normal sinus rhythm  Recent Labs: 06/28/2021: ALT 59; BUN 6; Creatinine, Ser 0.86; Potassium 3.9; Sodium 136 06/29/2021: Hemoglobin 14.2; Platelets 287 09/06/2021: B Natriuretic Peptide 367.6  Recent Lipid Panel    Component Value Date/Time   CHOL 149 06/28/2021 0644   TRIG 129 06/28/2021 0644   HDL 31 (L) 06/28/2021 0644   CHOLHDL 4.8 06/28/2021 0644   VLDL 26 06/28/2021 0644   LDLCALC 92 06/28/2021 0644     Risk Assessment/Calculations:          Physical Exam:    VS:  BP 112/80 (BP Location: Left Arm, Patient Position: Sitting, Cuff Size: Large)   Pulse 87   Ht 5\' 11"  (1.803 m)   Wt (!) 339 lb (153.8 kg)   SpO2 98%   BMI 47.28 kg/m     Wt Readings from Last 3 Encounters:  09/15/21 (!) 339 lb (153.8 kg)  07/18/21 (!) 331 lb (150.1 kg)  07/17/21 (!) 334 lb (151.5 kg)     GEN:  Well nourished, well developed in no acute distress HEENT: Normal NECK: No JVD; No carotid bruits LYMPHATICS: No lymphadenopathy CARDIAC: RRR, no murmurs, rubs, gallops RESPIRATORY:  Clear to auscultation without rales, wheezing or rhonchi  ABDOMEN: Soft, non-tender, non-distended MUSCULOSKELETAL:  No edema; No deformity  SKIN: Warm and dry NEUROLOGIC:  Alert and oriented x 3 PSYCHIATRIC:  Normal affect   ASSESSMENT:    1. HFrEF (heart failure with reduced ejection fraction) (HCC)   2. Primary  hypertension   3. Other pulmonary embolism without acute cor pulmonale, unspecified chronicity (HCC)    PLAN:    In order of problems listed above:  Cardiomyopathy, EF less than 20%, possibly nonischemic due to noncompaction on echo.  Describes NYHA class II-III symptoms.  .  Start Aldactone 12.5 mg daily, stop Lasix, start torsemide 40 mg daily.  Check BMP in 2 weeks.  Continue Coreg 6.25 twice daily, Entresto 24-26 twice daily.  07/19/21 was previously stopped due to cost issues. Hypertension, BP controlled.  Coreg, Entresto, Aldactone as above. History of PE s/p thrombectomy by vascular surgery.  On Eliquis.  Follow-up with vascular surgery.  Follow-up in 6 weeks for CHF medication titration.     Medication Adjustments/Labs and Tests Ordered: Current medicines are reviewed at length with the patient today.  Concerns regarding medicines are outlined above.  Orders Placed This Encounter  Procedures   Basic metabolic panel   Ambulatory referral to Vascular Surgery   EKG 12-Lead    Meds ordered this encounter  Medications   torsemide (DEMADEX) 20 MG tablet    Sig: Take 2 tablets (40 mg total) by mouth 2 (two) times daily.    Dispense:  60 tablet    Refill:  2   spironolactone (ALDACTONE) 25 MG tablet    Sig: Take 0.5 tablets (12.5 mg total) by mouth daily.    Dispense:  15 tablet    Refill:  3     Patient Instructions  Medication Instructions:   Your physician has recommended you make the following change in your medication:    STOP taking your Lasix.  2.    START taking Torsemide 40 MG once a day.  3.  START taking Spironolactone (Aldactone) 12.5 MG once a day.  *If you need a refill on your cardiac medications before your next appointment, please call your pharmacy*   Lab Work:  Your physician recommends that you return for lab (BMP) work in: 2 weeks.  Please come to our office on _________________at________________am/pm   If you have labs (blood work) drawn  today and your tests are completely normal, you will receive your results only by: MyChart Message (if you have MyChart) OR A paper copy in the mail If you have any lab test that is abnormal or we need to change your treatment, we will call you to review the results.   Testing/Procedures: None ordered   Follow-Up: At [facility], you and your health needs are our priority.  As part of our continuing mission to provide you with exceptional heart care, we have created designated Provider Care Teams.  These Care Teams include your primary Cardiologist (physician) and Advanced Practice Providers (APPs -  Physician Assistants and Nurse Practitioners) who all work together to provide you with the care you need, when you need it.  We recommend signing up for the patient portal called "MyChart".  Sign up information is provided on this After Visit Summary.  MyChart is used to connect with patients for Virtual Visits (Telemedicine).  Patients are able to view lab/test results, encounter notes, upcoming appointments, etc.  Non-urgent messages can be sent to your provider as well.   To learn more about what you can do with MyChart, go to [email or web address].    Your next appointment:   6 week(s)  The format for your next appointment:   In Person  Provider:   Debbe Odea, MD  ONLY   Other Instructions    Signed, Debbe Odea, MD  09/15/2021 5:22 PM    [facility]

## 2021-09-21 ENCOUNTER — Ambulatory Visit: Payer: BC Managed Care – PPO

## 2021-09-22 ENCOUNTER — Ambulatory Visit: Payer: BC Managed Care – PPO | Admitting: Nurse Practitioner

## 2021-09-29 ENCOUNTER — Other Ambulatory Visit: Payer: BC Managed Care – PPO

## 2021-10-05 ENCOUNTER — Encounter: Payer: Self-pay | Admitting: *Deleted

## 2021-10-06 ENCOUNTER — Other Ambulatory Visit: Payer: Self-pay

## 2021-10-06 ENCOUNTER — Emergency Department (HOSPITAL_COMMUNITY)
Admission: EM | Admit: 2021-10-06 | Discharge: 2021-10-06 | Disposition: A | Discharge status: Home / Self Care | Payer: BC Managed Care – PPO | Attending: Emergency Medicine | Admitting: Emergency Medicine

## 2021-10-06 ENCOUNTER — Encounter (HOSPITAL_COMMUNITY): Payer: Self-pay | Admitting: Emergency Medicine

## 2021-10-06 ENCOUNTER — Emergency Department (HOSPITAL_COMMUNITY): Payer: BC Managed Care – PPO

## 2021-10-06 DIAGNOSIS — I509 Heart failure, unspecified: Secondary | ICD-10-CM | POA: Diagnosis not present

## 2021-10-06 DIAGNOSIS — Z7901 Long term (current) use of anticoagulants: Secondary | ICD-10-CM | POA: Insufficient documentation

## 2021-10-06 DIAGNOSIS — M25531 Pain in right wrist: Secondary | ICD-10-CM

## 2021-10-06 DIAGNOSIS — Z7984 Long term (current) use of oral hypoglycemic drugs: Secondary | ICD-10-CM | POA: Diagnosis not present

## 2021-10-06 DIAGNOSIS — Z20822 Contact with and (suspected) exposure to covid-19: Secondary | ICD-10-CM | POA: Insufficient documentation

## 2021-10-06 DIAGNOSIS — E119 Type 2 diabetes mellitus without complications: Secondary | ICD-10-CM | POA: Diagnosis not present

## 2021-10-06 DIAGNOSIS — R059 Cough, unspecified: Secondary | ICD-10-CM | POA: Diagnosis not present

## 2021-10-06 DIAGNOSIS — M25542 Pain in joints of left hand: Secondary | ICD-10-CM

## 2021-10-06 DIAGNOSIS — J069 Acute upper respiratory infection, unspecified: Secondary | ICD-10-CM

## 2021-10-06 DIAGNOSIS — M2550 Pain in unspecified joint: Secondary | ICD-10-CM | POA: Insufficient documentation

## 2021-10-06 DIAGNOSIS — Z79899 Other long term (current) drug therapy: Secondary | ICD-10-CM | POA: Insufficient documentation

## 2021-10-06 DIAGNOSIS — I110 Hypertensive heart disease with heart failure: Secondary | ICD-10-CM | POA: Diagnosis not present

## 2021-10-06 DIAGNOSIS — R0981 Nasal congestion: Secondary | ICD-10-CM | POA: Insufficient documentation

## 2021-10-06 DIAGNOSIS — R509 Fever, unspecified: Secondary | ICD-10-CM | POA: Insufficient documentation

## 2021-10-06 LAB — CBC WITH DIFFERENTIAL/PLATELET
Abs Immature Granulocytes: 0.02 10*3/uL (ref 0.00–0.07)
Basophils Absolute: 0 10*3/uL (ref 0.0–0.1)
Basophils Relative: 0 %
Eosinophils Absolute: 0.1 10*3/uL (ref 0.0–0.5)
Eosinophils Relative: 1 %
HCT: 44.8 % (ref 39.0–52.0)
Hemoglobin: 15.5 g/dL (ref 13.0–17.0)
Immature Granulocytes: 0 %
Lymphocytes Relative: 24 %
Lymphs Abs: 2.1 10*3/uL (ref 0.7–4.0)
MCH: 29.8 pg (ref 26.0–34.0)
MCHC: 34.6 g/dL (ref 30.0–36.0)
MCV: 86.2 fL (ref 80.0–100.0)
Monocytes Absolute: 1.2 10*3/uL — ABNORMAL HIGH (ref 0.1–1.0)
Monocytes Relative: 13 %
Neutro Abs: 5.6 10*3/uL (ref 1.7–7.7)
Neutrophils Relative %: 62 %
Platelets: 278 10*3/uL (ref 150–400)
RBC: 5.2 MIL/uL (ref 4.22–5.81)
RDW: 12.7 % (ref 11.5–15.5)
WBC: 9 10*3/uL (ref 4.0–10.5)
nRBC: 0 % (ref 0.0–0.2)

## 2021-10-06 LAB — COMPREHENSIVE METABOLIC PANEL
ALT: 18 U/L (ref 0–44)
AST: 24 U/L (ref 15–41)
Albumin: 3.6 g/dL (ref 3.5–5.0)
Alkaline Phosphatase: 81 U/L (ref 38–126)
Anion gap: 14 (ref 5–15)
BUN: 23 mg/dL — ABNORMAL HIGH (ref 6–20)
CO2: 26 mmol/L (ref 22–32)
Calcium: 8.9 mg/dL (ref 8.9–10.3)
Chloride: 94 mmol/L — ABNORMAL LOW (ref 98–111)
Creatinine, Ser: 1.27 mg/dL — ABNORMAL HIGH (ref 0.61–1.24)
GFR, Estimated: >60 mL/min (ref >60)
Glucose, Bld: 172 mg/dL — ABNORMAL HIGH (ref 70–99)
Potassium: 3.1 mmol/L — ABNORMAL LOW (ref 3.5–5.1)
Sodium: 134 mmol/L — ABNORMAL LOW (ref 135–145)
Total Bilirubin: 1.1 mg/dL (ref 0.3–1.2)
Total Protein: 8.4 g/dL — ABNORMAL HIGH (ref 6.5–8.1)

## 2021-10-06 LAB — RESP PANEL BY RT-PCR (FLU A&B, COVID) ARPGX2
Influenza A by PCR: NEGATIVE
Influenza B by PCR: NEGATIVE
SARS Coronavirus 2 by RT PCR: NEGATIVE

## 2021-10-06 MED ORDER — PREDNISONE 10 MG PO TABS: 20.0000 mg | ORAL_TABLET | Freq: Two times a day (BID) | ORAL | 0 refills | Status: DC

## 2021-10-06 MED ORDER — DOXYCYCLINE HYCLATE 100 MG PO CAPS: 100.0000 mg | ORAL_CAPSULE | Freq: Two times a day (BID) | ORAL | 0 refills | Status: DC

## 2021-10-06 NOTE — ED Provider Notes (Signed)
[facility]-EMERGENCY DEPT Provider Note   CSN: [id number] Arrival date & time: 10/06/21  0038     History Chief Complaint  Patient presents with   Generalized Body Aches   Chills   Wrist Pain   Hand Pain    Robert Mccann is a 25 y.o. male.  Patient is a 25 year old male with extensive past medical history including diabetes, hereditary cardiomyopathy with heart failure, prior pulmonary embolism, hypertension.  Patient presenting today for evaluation of pain in his right wrist, left index finger, and URI symptoms that began approximately 1 week ago.  He describes fevers, chills, cough, and congestion.  He is concerned he may have COVID-19.  The history is provided by the patient.      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 05/2021 noted during periods of sleep @ time of hospitalization for PE.   Cardiomyopathy (HCC)    a. 05/2021 Echo: EF <20%, glob HK. [place]:C ratio of 2.1:1 suggesting non-compaction. Gr2 DD. Sev reduced RV fxn. Nl PASP. Mildly dil RA. Mild MR.   Diabetes mellitus without complication (HCC)    HFrEF (heart failure with reduced ejection fraction) (HCC)    a. 05/2021 Echo: EF <20%. ? non-compaction.   Hypertension    Pulmonary embolism (HCC)    a. 05/2021 CTA Chest: PE extending from lobar arteris of RUL and RLL w/ concern for pulml infarct-->s/p thrombolysis and thrombectomy of RUL/RML/RLL.    Patient Active Problem List   Diagnosis Date Noted   PE (pulmonary thromboembolism) (HCC) 06/28/2021   HFrEF (heart failure with reduced ejection fraction) (HCC)    Acute pulmonary embolism with acute cor pulmonale (HCC)    Obesity, Class III, BMI 40-49.9 (morbid obesity) (HCC)    DM II (diabetes mellitus, type II), controlled (HCC) 06/27/2021   Chest pain 06/27/2021   HTN (hypertension) 06/27/2021   Malaise 05/25/2014   Routine history and physical examination of adult 05/25/2014   Obesity 05/25/2014    Past  Surgical History:  Procedure Laterality Date   HAND SURGERY Left age 21   HAND SURGERY     PULMONARY THROMBECTOMY N/A 06/28/2021   Procedure: PULMONARY THROMBECTOMY;  Surgeon: Annice Needy, MD;  Location: [facility] INVASIVE CV LAB;  Service: Cardiovascular;  Laterality: N/A;       Family History  Problem Relation Age of Onset   Heart failure Mother    Diabetes Father    Hypertension Father     Social History   Tobacco Use   Smoking status: Never   Smokeless tobacco: Never  Vaping Use   Vaping Use: Never used  Substance Use Topics   Alcohol use: Not Currently    Comment: special occasions   Drug use: Yes    Types: Marijuana    Comment: 6 days ago    Home Medications Prior to Admission medications   Medication Sig Start Date End Date Taking? Authorizing Provider  apixaban (ELIQUIS) 5 MG TABS tablet Take 5 mg by mouth 2 (two) times daily.    [provider]  benzonatate (TESSALON) 100 MG capsule Take 1 capsule (100 mg total) by mouth every 8 (eight) hours. 07/18/21   Theron Arista, PA-C  Biotin 1000 MCG tablet Take 1 tablet (1 mg total) by mouth daily. Chew 1 gummy daily. 06/30/21   Enedina Finner, MD  carvedilol (COREG) 6.25 MG tablet Take 1 tablet (6.25 mg total) by mouth 2 (two) times daily with a meal.  06/30/21   Enedina Finner, MD  metFORMIN (GLUCOPHAGE) 500 MG tablet Take 1 tablet (500 mg total) by mouth 2 (two) times daily. 06/30/21   Enedina Finner, MD  Multiple Vitamin (ONE-A-DAY MENS PO) Take 1 tablet by mouth daily.    [provider]  sacubitril-valsartan (ENTRESTO) 24-26 MG Take 1 tablet by mouth 2 (two) times daily. 07/06/21   Creig Hines, NP  spironolactone (ALDACTONE) 25 MG tablet Take 0.5 tablets (12.5 mg total) by mouth daily. 09/15/21 12/14/21  Debbe Odea, MD  torsemide (DEMADEX) 20 MG tablet Take 2 tablets (40 mg total) by mouth 2 (two) times daily. 09/15/21 12/14/21  Debbe Odea, MD    Allergies    Patient has no known  allergies.  Review of Systems   Review of Systems  All other systems reviewed and are negative.  Physical Exam Updated Vital Signs BP (!) 148/106 (BP Location: Left Arm)   Pulse 97   Temp 98.3 F (36.8 C) (Oral)   Resp 17   Ht 5\' 10"  (1.778 m)   Wt (!) 143.8 kg   SpO2 96%   BMI 45.48 kg/m   Physical Exam Vitals and nursing note reviewed.  Constitutional:      General: He is not in acute distress.    Appearance: Normal appearance. He is well-developed. He is not ill-appearing or diaphoretic.  HENT:     Head: Normocephalic and atraumatic.  Cardiovascular:     Rate and Rhythm: Normal rate and regular rhythm.     Heart sounds: No murmur heard.   No friction rub.  Pulmonary:     Effort: Pulmonary effort is normal. No respiratory distress.     Breath sounds: Normal breath sounds. No wheezing or rales.  Abdominal:     General: Bowel sounds are normal. There is no distension.     Palpations: Abdomen is soft.     Tenderness: There is no abdominal tenderness.  Musculoskeletal:        General: Normal range of motion.     Cervical back: Normal range of motion and neck supple.     Comments: The right wrist appears grossly normal.  There is slight warmth, but no erythema.  He does have some discomfort with range of motion.  The left index finger has erythema and warmth overlying the second MCP joint.  He has pain with range of motion of this joint.  Skin:    General: Skin is warm and dry.  Neurological:     Mental Status: He is alert and oriented to person, place, and time.     Coordination: Coordination normal.    ED Results / Procedures / Treatments   Labs (all labs ordered are listed, but only abnormal results are displayed) Labs Reviewed  CBC WITH DIFFERENTIAL/PLATELET - Abnormal; Notable for the following components:      Result Value   Monocytes Absolute 1.2 (*)    All other components within normal limits  COMPREHENSIVE METABOLIC PANEL - Abnormal; Notable for the  following components:   Sodium 134 (*)    Potassium 3.1 (*)    Chloride 94 (*)    Glucose, Bld 172 (*)    BUN 23 (*)    Creatinine, Ser 1.27 (*)    Total Protein 8.4 (*)    All other components within normal limits  RESP PANEL BY RT-PCR (FLU A&B, COVID) [id number]    EKG None  Radiology DG Wrist Complete Right  Result Date: 10/06/2021 CLINICAL DATA:  Right wrist  and left finger pain. EXAM: RIGHT WRIST - COMPLETE 3+ VIEW COMPARISON:  None. FINDINGS: There is no evidence of fracture or dislocation. There is no evidence of arthropathy or other focal bone abnormality. Soft tissues are unremarkable. IMPRESSION: Negative. Electronically Signed   By: Aram Candela M.D.   On: 10/06/2021 02:00   DG Finger Index Left  Result Date: 10/06/2021 CLINICAL DATA:  Left finger pain. EXAM: LEFT INDEX FINGER 2+V COMPARISON:  None. FINDINGS: There is no evidence of fracture or dislocation. There is no evidence of arthropathy or other focal bone abnormality. Mild diffuse soft tissue swelling is noted. IMPRESSION: No acute osseous abnormality. Electronically Signed   By: Aram Candela M.D.   On: 10/06/2021 02:00    Procedures Procedures   Medications Ordered in ED Medications - No data to display  ED Course  I have reviewed the triage vital signs and the nursing notes.  Pertinent labs & imaging results that were available during my care of the patient were reviewed by me and considered in my medical decision making (see chart for details).    MDM Rules/Calculators/A&P  Patient with extensive past medical history as described in the HPI presenting with complaints of joint pain and URI symptoms.  His COVID test is negative and x-rays of his head and finger are negative.  Laboratory studies are reassuring.  At this point, I am uncertain as to what is causing this patient's symptoms, but suspect an inflammatory process, possibly related to his recent URI.  Other considerations are gout and much,  much less likely septic arthritis.  My plan is to treat with prednisone as an anti-inflammatory as well as antibiotics given the fact that he has extensive comorbidities.  Patient to follow-up with primary doctor and return if he worsens.  Final Clinical Impression(s) / ED Diagnoses Final diagnoses:  None    Rx / DC Orders ED Discharge Orders     None        Geoffery Lyons, MD 10/06/21 [phone]

## 2021-10-06 NOTE — Discharge Instructions (Addendum)
Begin taking prednisone and doxycycline as prescribed.  Follow-up with your primary doctor if symptoms or not improving in the next few days, and return to the ER if symptoms significantly worsen or change.

## 2021-10-06 NOTE — ED Triage Notes (Signed)
Pt reports covid-like sx x 3 days. Also c/o right wrist joint pain/swelling and left pointer finger joint swelling/pain. Hx of heart failure.

## 2021-10-06 NOTE — ED Provider Notes (Signed)
Emergency Medicine Provider Triage Evaluation Note  Robert Mccann , a 25 y.o. male  was evaluated in triage.  Pt complains of concern for COVID-19.  He reports that he has been having subjective fevers, chills, increased sweating past 3 days.  He is unsure if he has been around anyone who has been sick.  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  He denies any cough or shortness of breath however.  He is also complaining of atraumatic right wrist pain as well as left index finger pain.  He states that his swelling and red.  He denies history of gout.  Review of Systems  Positive: + subjective fevers, chills, fatigue, body aches Negative: - cough, SOB  Physical Exam  BP (!) 148/106 (BP Location: Left Arm)   Pulse 97   Temp 98.3 F (36.8 C) (Oral)   Resp 17   Ht 5\' 10"  (1.778 m)   Wt (!) 143.8 kg   SpO2 96%   BMI 45.48 kg/m  Gen:   Awake, no distress   Resp:  Normal effort  MSK:   Moves extremities without difficulty  Other:  Swelling and TTP to R wrist with limited ROM. + swelling, TTP, and erythema to MCP joint of L index finger.   Medical Decision Making  Medically screening exam initiated at 1:40 AM.  Appropriate orders placed.  was informed that the remainder of the evaluation will be completed by another provider, this initial triage assessment does not replace that evaluation, and the importance of remaining in the ED until their evaluation is complete.     [name], PA-C 10/06/21 12/06/21    [id number], MD 10/06/21 [phone]

## 2021-10-13 ENCOUNTER — Ambulatory Visit (INDEPENDENT_AMBULATORY_CARE_PROVIDER_SITE_OTHER): Payer: BC Managed Care – PPO | Admitting: Vascular Surgery

## 2021-10-13 ENCOUNTER — Other Ambulatory Visit: Payer: Self-pay

## 2021-10-13 ENCOUNTER — Encounter (INDEPENDENT_AMBULATORY_CARE_PROVIDER_SITE_OTHER): Payer: Self-pay | Admitting: Vascular Surgery

## 2021-10-13 VITALS — BP 127/93 | HR 98 | Resp 16 | Wt 317.0 lb

## 2021-10-13 DIAGNOSIS — I10 Essential (primary) hypertension: Secondary | ICD-10-CM

## 2021-10-13 DIAGNOSIS — I5020 Unspecified systolic (congestive) heart failure: Secondary | ICD-10-CM

## 2021-10-13 DIAGNOSIS — I2699 Other pulmonary embolism without acute cor pulmonale: Secondary | ICD-10-CM | POA: Diagnosis not present

## 2021-10-13 DIAGNOSIS — E119 Type 2 diabetes mellitus without complications: Secondary | ICD-10-CM

## 2021-10-13 NOTE — Assessment & Plan Note (Signed)
blood glucose control important in reducing the progression of atherosclerotic disease. Also, involved in wound healing. On appropriate medications.  

## 2021-10-13 NOTE — Assessment & Plan Note (Signed)
Doing well after pulmonary thrombectomy 3 months ago and has tolerated anticoagulation.  Would do at least 1 year of full anticoagulation with his previous heart and cardiomyopathy issues he may be best with long-term anticoagulation at least with half dose Eliquis if not full dose Eliquis.  I will plan to see him back in 9 months we can discuss his long-term anticoagulation strategy.

## 2021-10-13 NOTE — Progress Notes (Signed)
MRN : [id number]  Robert Mccann is a 25 y.o. (18-Jul-1996) male who presents with chief complaint of  Chief Complaint  Patient presents with   Follow-up    Ref [facility] PE consult  .  History of Present Illness: Patient returns today in follow up of his pulmonary embolus.  Almost immediately after pulmonary thrombectomy 3 months ago, he began breathing relatively normally.  He said it took him a week or 2 to get his conditioning back but overall he did well.  He had no periprocedural complications after pulmonary thrombectomy.  He has tolerated anticoagulation without any problems.  He has now been on that for about 3 months.  No significant leg pain or swelling either  Current Outpatient Medications  Medication Sig Dispense Refill   apixaban (ELIQUIS) 5 MG TABS tablet Take 5 mg by mouth 2 (two) times daily.     benzonatate (TESSALON) 100 MG capsule Take 1 capsule (100 mg total) by mouth every 8 (eight) hours. 21 capsule 0   Biotin 1000 MCG tablet Take 1 tablet (1 mg total) by mouth daily. Chew 1 gummy daily. 30 tablet 3   carvedilol (COREG) 6.25 MG tablet Take 1 tablet (6.25 mg total) by mouth 2 (two) times daily with a meal. 60 tablet 3   doxycycline (VIBRAMYCIN) 100 MG capsule Take 1 capsule (100 mg total) by mouth 2 (two) times daily. One po bid x 7 days 14 capsule 0   metFORMIN (GLUCOPHAGE) 500 MG tablet Take 1 tablet (500 mg total) by mouth 2 (two) times daily. 60 tablet 3   Multiple Vitamin (ONE-A-DAY MENS PO) Take 1 tablet by mouth daily.     predniSONE (DELTASONE) 10 MG tablet Take 2 tablets (20 mg total) by mouth 2 (two) times daily with a meal. 20 tablet 0   sacubitril-valsartan (ENTRESTO) 24-26 MG Take 1 tablet by mouth 2 (two) times daily. 180 tablet 3   spironolactone (ALDACTONE) 25 MG tablet Take 0.5 tablets (12.5 mg total) by mouth daily. 15 tablet 3   torsemide (DEMADEX) 20 MG tablet Take 2 tablets (40 mg total) by mouth 2 (two) times daily. 60 tablet 2   No  current facility-administered medications for this visit.    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 05/2021 noted during periods of sleep @ time of hospitalization for PE.   Cardiomyopathy (HCC)    a. 05/2021 Echo: EF <20%, glob HK. [place]:C ratio of 2.1:1 suggesting non-compaction. Gr2 DD. Sev reduced RV fxn. Nl PASP. Mildly dil RA. Mild MR.   Diabetes mellitus without complication (HCC)    HFrEF (heart failure with reduced ejection fraction) (HCC)    a. 05/2021 Echo: EF <20%. ? non-compaction.   Hypertension    Pulmonary embolism (HCC)    a. 05/2021 CTA Chest: PE extending from lobar arteris of RUL and RLL w/ concern for pulml infarct-->s/p thrombolysis and thrombectomy of RUL/RML/RLL.    Past Surgical History:  Procedure Laterality Date   HAND SURGERY Left age 11   HAND SURGERY     PULMONARY THROMBECTOMY N/A 06/28/2021   Procedure: PULMONARY THROMBECTOMY;  Surgeon: Annice Needy, MD;  Location: [facility];  Service: Cardiovascular;  Laterality: N/A;     Social History   Tobacco Use   Smoking status: Never   Smokeless tobacco: Never  Vaping Use   Vaping Use: Never used  Substance Use Topics   Alcohol use: Not Currently  Comment: special occasions   Drug use: Yes    Types: Marijuana    Comment: 6 days ago       Family History  Problem Relation Age of Onset   Heart failure Mother    Diabetes Father    Hypertension Father      No Known Allergies   REVIEW OF SYSTEMS (Negative unless checked)  Constitutional: [] Weight loss  [] Fever  [] Chills Cardiac: [] Chest pain   [] Chest pressure   [x] Palpitations   [] Shortness of breath when laying flat   [] Shortness of breath at rest   [] Shortness of breath with exertion. Vascular:  [] Pain in legs with walking   [] Pain in legs at rest   [] Pain in legs when laying flat   [] Claudication   [] Pain in feet when walking  [] Pain in feet at rest  [] Pain in feet when laying flat   [x] History of DVT    [x] Phlebitis   [] Swelling in legs   [] Varicose veins   [] Non-healing ulcers Pulmonary:   [] Uses home oxygen   [] Productive cough   [] Hemoptysis   [] Wheeze  [] COPD   [] Asthma Neurologic:  [] Dizziness  [] Blackouts   [] Seizures   [] History of stroke   [] History of TIA  [] Aphasia   [] Temporary blindness   [] Dysphagia   [] Weakness or numbness in arms   [] Weakness or numbness in legs Musculoskeletal:  [] Arthritis   [] Joint swelling   [] Joint pain   [] Low back pain Hematologic:  [] Easy bruising  [] Easy bleeding   [] Hypercoagulable state   [] Anemic   Gastrointestinal:  [] Blood in stool   [] Vomiting blood  [] Gastroesophageal reflux/heartburn   [] Abdominal pain Genitourinary:  [] Chronic kidney disease   [] Difficult urination  [] Frequent urination  [] Burning with urination   [] Hematuria Skin:  [] Rashes   [] Ulcers   [] Wounds Psychological:  [] History of anxiety   []  History of major depression.  Physical Examination  BP (!) 127/93 (BP Location: Right Arm)   Pulse 98   Resp 16   Wt (!) 317 lb (143.8 kg)   BMI 45.48 kg/m  Gen:  WD/WN, NAD Head: [place]/AT, No temporalis wasting. Ear/Nose/Throat: Hearing grossly intact, nares w/o erythema or drainage Eyes: Conjunctiva clear. Sclera non-icteric Neck: Supple.  Trachea midline Pulmonary:  Good air movement, no use of accessory muscles.  Cardiac: RRR, no JVD Vascular:  Vessel Right Left  Radial Palpable Palpable                          PT Palpable Palpable  DP Palpable Palpable   Gastrointestinal: soft, non-tender/non-distended. No guarding/reflex.  Musculoskeletal: M/S 5/5 throughout.  No deformity or atrophy.  Mild bilateral lower extremity edema. Neurologic: Sensation grossly intact in extremities.  Symmetrical.  Speech is fluent.  Psychiatric: Judgment intact, Mood & affect appropriate for pt's clinical situation. Dermatologic: No rashes or ulcers noted.  No cellulitis or open wounds.      Labs Recent Results (from the past 2160 hour(s))   SARS CORONAVIRUS 2 (TAT 6-24 HRS) Nasopharyngeal Nasopharyngeal Swab     Status: None   Collection Time: 07/18/21  3:30 PM   Specimen: Nasopharyngeal Swab  Result Value Ref Range   SARS Coronavirus 2 NEGATIVE NEGATIVE    Comment: (NOTE) SARS-CoV-2 target nucleic acids are NOT DETECTED.  The SARS-CoV-2 RNA is generally detectable in upper and lower respiratory specimens during the acute phase of infection. Negative results do not preclude SARS-CoV-2 infection, do not rule out co-infections with other pathogens, and  should not be used as the sole basis for treatment or other patient management decisions. Negative results must be combined with clinical observations, patient history, and epidemiological information. The expected result is Negative.  Fact Sheet for Patients: HairSlick.no  Fact Sheet for Healthcare Providers: quierodirigir.com  This test is not yet approved or cleared by the [place] FDA and  has been authorized for detection and/or diagnosis of SARS-CoV-2 by FDA under an Emergency Use Authorization (EUA). This EUA will remain  in effect (meaning this test can be used) for the duration of the COVID-19 declaration under Se ction 564(b)(1) of the Act, 21 U.S.C. section 360bbb-3(b)(1), unless the authorization is terminated or revoked sooner.  Performed at [facility], [address] [place], [place] [address]   Brain natriuretic peptide     Status: Abnormal   Collection Time: 09/06/21 11:57 AM  Result Value Ref Range   B Natriuretic Peptide 367.6 (H) 0.0 - 100.0 pg/mL    Comment: Performed at [facility], [address] [place], [place] [address]  Resp Panel by RT-PCR (Flu A&B, Covid) Nasopharyngeal Swab     Status: None   Collection Time: 10/06/21 12:51 AM   Specimen: Nasopharyngeal Swab; Nasopharyngeal(NP) swabs in vial transport medium  Result Value Ref Range   SARS Coronavirus 2 by  RT PCR NEGATIVE NEGATIVE    Comment: (NOTE) SARS-CoV-2 target nucleic acids are NOT DETECTED.  The SARS-CoV-2 RNA is generally detectable in upper respiratory specimens during the acute phase of infection. The lowest concentration of SARS-CoV-2 viral copies this assay can detect is 138 copies/mL. A negative result does not preclude SARS-Cov-2 infection and should not be used as the sole basis for treatment or other patient management decisions. A negative result may occur with  improper specimen collection/handling, submission of specimen other than nasopharyngeal swab, presence of viral mutation(s) within the areas targeted by this assay, and inadequate number of viral copies(<138 copies/mL). A negative result must be combined with clinical observations, patient history, and epidemiological information. The expected result is Negative.  Fact Sheet for Patients:  BloggerCourse.com  Fact Sheet for Healthcare Providers:  SeriousBroker.it  This test is no t yet approved or cleared by the [place] FDA and  has been authorized for detection and/or diagnosis of SARS-CoV-2 by FDA under an Emergency Use Authorization (EUA). This EUA will remain  in effect (meaning this test can be used) for the duration of the COVID-19 declaration under Section 564(b)(1) of the Act, 21 U.S.C.section 360bbb-3(b)(1), unless the authorization is terminated  or revoked sooner.       Influenza A by PCR NEGATIVE NEGATIVE   Influenza B by PCR NEGATIVE NEGATIVE    Comment: (NOTE) The Xpert Xpress SARS-CoV-2/FLU/RSV plus assay is intended as an aid in the diagnosis of influenza from Nasopharyngeal swab specimens and should not be used as a sole basis for treatment. Nasal washings and aspirates are unacceptable for Xpert Xpress SARS-CoV-2/FLU/RSV testing.  Fact Sheet for Patients: BloggerCourse.com  Fact Sheet for Healthcare  Providers: SeriousBroker.it  This test is not yet approved or cleared by the [place] FDA and has been authorized for detection and/or diagnosis of SARS-CoV-2 by FDA under an Emergency Use Authorization (EUA). This EUA will remain in effect (meaning this test can be used) for the duration of the COVID-19 declaration under Section 564(b)(1) of the Act, 21 U.S.C. section 360bbb-3(b)(1), unless the authorization is terminated or revoked.  Performed at [facility], [address]  [place], [place] [address]   CBC with Differential     Status: Abnormal   Collection Time: 10/06/21  1:35 AM  Result Value Ref Range   WBC 9.0 4.0 - 10.5 K/uL   RBC 5.20 4.22 - 5.81 MIL/uL   Hemoglobin 15.5 13.0 - 17.0 g/dL   HCT 02.1 11.5 - 52.0 %   MCV 86.2 80.0 - 100.0 fL   MCH 29.8 26.0 - 34.0 pg   MCHC 34.6 30.0 - 36.0 g/dL   RDW 80.2 23.3 - 61.2 %   Platelets 278 150 - 400 K/uL   nRBC 0.0 0.0 - 0.2 %   Neutrophils Relative % 62 %   Neutro Abs 5.6 1.7 - 7.7 K/uL   Lymphocytes Relative 24 %   Lymphs Abs 2.1 0.7 - 4.0 K/uL   Monocytes Relative 13 %   Monocytes Absolute 1.2 (H) 0.1 - 1.0 K/uL   Eosinophils Relative 1 %   Eosinophils Absolute 0.1 0.0 - 0.5 K/uL   Basophils Relative 0 %   Basophils Absolute 0.0 0.0 - 0.1 K/uL   Immature Granulocytes 0 %   Abs Immature Granulocytes 0.02 0.00 - 0.07 K/uL    Comment: Performed at [facility], [address], [place], [place] [address]  Comprehensive metabolic panel     Status: Abnormal   Collection Time: 10/06/21  1:35 AM  Result Value Ref Range   Sodium 134 (L) 135 - 145 mmol/L   Potassium 3.1 (L) 3.5 - 5.1 mmol/L   Chloride 94 (L) 98 - 111 mmol/L   CO2 26 22 - 32 mmol/L   Glucose, Bld 172 (H) 70 - 99 mg/dL    Comment: Glucose reference range applies only to samples taken after fasting for at least 8 hours.   BUN 23 (H) 6 - 20 mg/dL   Creatinine, Ser 5.30 (H) 0.61 - 1.24 mg/dL    Calcium 8.9 8.9 - 05.1 mg/dL   Total Protein 8.4 (H) 6.5 - 8.1 g/dL   Albumin 3.6 3.5 - 5.0 g/dL   AST 24 15 - 41 U/L   ALT 18 0 - 44 U/L   Alkaline Phosphatase 81 38 - 126 U/L   Total Bilirubin 1.1 0.3 - 1.2 mg/dL   GFR, Estimated >10 >21 mL/min    Comment: (NOTE) Calculated using the CKD-EPI Creatinine Equation (2021)    Anion gap 14 5 - 15    Comment: Performed at [facility] [address], [place], [place] [address]    Radiology DG Wrist Complete Right  Result Date: 10/06/2021 CLINICAL DATA:  Right wrist and left finger pain. EXAM: RIGHT WRIST - COMPLETE 3+ VIEW COMPARISON:  None. FINDINGS: There is no evidence of fracture or dislocation. There is no evidence of arthropathy or other focal bone abnormality. Soft tissues are unremarkable. IMPRESSION: Negative. Electronically Signed   By: Aram Candela M.D.   On: 10/06/2021 02:00   DG Finger Index Left  Result Date: 10/06/2021 CLINICAL DATA:  Left finger pain. EXAM: LEFT INDEX FINGER 2+V COMPARISON:  None. FINDINGS: There is no evidence of fracture or dislocation. There is no evidence of arthropathy or other focal bone abnormality. Mild diffuse soft tissue swelling is noted. IMPRESSION: No acute osseous abnormality. Electronically Signed   By: Aram Candela M.D.   On: 10/06/2021 02:00    Assessment/Plan  PE (pulmonary thromboembolism) (HCC) Doing well after pulmonary thrombectomy 3 months ago and has tolerated anticoagulation.  Would do at least 1 year of full  anticoagulation with his previous heart and cardiomyopathy issues he may be best with long-term anticoagulation at least with half dose Eliquis if not full dose Eliquis.  I will plan to see him back in 9 months we can discuss his long-term anticoagulation strategy.  HTN (hypertension) blood pressure control important in reducing the progression of atherosclerotic disease. On appropriate oral medications.   DM II (diabetes mellitus, type II),  controlled (HCC) blood glucose control important in reducing the progression of atherosclerotic disease. Also, involved in wound healing. On appropriate medications.    Festus Barren, MD  10/13/2021 10:42 AM    This note was created with Dragon medical transcription system.  Any errors from dictation are purely unintentional

## 2021-10-13 NOTE — Assessment & Plan Note (Signed)
blood pressure control important in reducing the progression of atherosclerotic disease. On appropriate oral medications.  

## 2021-10-27 ENCOUNTER — Ambulatory Visit: Payer: BC Managed Care – PPO | Admitting: Cardiology

## 2021-11-05 ENCOUNTER — Inpatient Hospital Stay: Payer: BC Managed Care – PPO

## 2021-11-05 ENCOUNTER — Emergency Department: Payer: BC Managed Care – PPO

## 2021-11-05 ENCOUNTER — Other Ambulatory Visit: Payer: Self-pay

## 2021-11-05 ENCOUNTER — Encounter: Payer: Self-pay | Admitting: Emergency Medicine

## 2021-11-05 ENCOUNTER — Inpatient Hospital Stay
Admission: EM | Admit: 2021-11-05 | Discharge: 2021-11-09 | DRG: 065 | Disposition: A | Discharge status: Home / Self Care | Payer: BC Managed Care – PPO | Attending: Internal Medicine | Admitting: Internal Medicine

## 2021-11-05 DIAGNOSIS — Z20822 Contact with and (suspected) exposure to covid-19: Secondary | ICD-10-CM | POA: Diagnosis present

## 2021-11-05 DIAGNOSIS — I428 Other cardiomyopathies: Secondary | ICD-10-CM | POA: Diagnosis present

## 2021-11-05 DIAGNOSIS — I63511 Cerebral infarction due to unspecified occlusion or stenosis of right middle cerebral artery: Secondary | ICD-10-CM

## 2021-11-05 DIAGNOSIS — I2699 Other pulmonary embolism without acute cor pulmonale: Secondary | ICD-10-CM | POA: Diagnosis not present

## 2021-11-05 DIAGNOSIS — Z7982 Long term (current) use of aspirin: Secondary | ICD-10-CM

## 2021-11-05 DIAGNOSIS — Z7984 Long term (current) use of oral hypoglycemic drugs: Secondary | ICD-10-CM

## 2021-11-05 DIAGNOSIS — R414 Neurologic neglect syndrome: Secondary | ICD-10-CM | POA: Diagnosis present

## 2021-11-05 DIAGNOSIS — E876 Hypokalemia: Secondary | ICD-10-CM | POA: Diagnosis present

## 2021-11-05 DIAGNOSIS — I6389 Other cerebral infarction: Secondary | ICD-10-CM | POA: Diagnosis not present

## 2021-11-05 DIAGNOSIS — R29701 NIHSS score 1: Secondary | ICD-10-CM | POA: Diagnosis present

## 2021-11-05 DIAGNOSIS — Z7901 Long term (current) use of anticoagulants: Secondary | ICD-10-CM

## 2021-11-05 DIAGNOSIS — E1141 Type 2 diabetes mellitus with diabetic mononeuropathy: Secondary | ICD-10-CM

## 2021-11-05 DIAGNOSIS — Z823 Family history of stroke: Secondary | ICD-10-CM

## 2021-11-05 DIAGNOSIS — E1121 Type 2 diabetes mellitus with diabetic nephropathy: Secondary | ICD-10-CM | POA: Diagnosis not present

## 2021-11-05 DIAGNOSIS — R4702 Dysphasia: Secondary | ICD-10-CM | POA: Diagnosis present

## 2021-11-05 DIAGNOSIS — Z6841 Body Mass Index (BMI) 40.0 and over, adult: Secondary | ICD-10-CM

## 2021-11-05 DIAGNOSIS — H538 Other visual disturbances: Secondary | ICD-10-CM | POA: Diagnosis present

## 2021-11-05 DIAGNOSIS — R4701 Aphasia: Secondary | ICD-10-CM | POA: Diagnosis present

## 2021-11-05 DIAGNOSIS — E785 Hyperlipidemia, unspecified: Secondary | ICD-10-CM | POA: Diagnosis present

## 2021-11-05 DIAGNOSIS — Z8249 Family history of ischemic heart disease and other diseases of the circulatory system: Secondary | ICD-10-CM

## 2021-11-05 DIAGNOSIS — R1310 Dysphagia, unspecified: Secondary | ICD-10-CM | POA: Diagnosis present

## 2021-11-05 DIAGNOSIS — I110 Hypertensive heart disease with heart failure: Secondary | ICD-10-CM | POA: Diagnosis present

## 2021-11-05 DIAGNOSIS — Z8673 Personal history of transient ischemic attack (TIA), and cerebral infarction without residual deficits: Secondary | ICD-10-CM

## 2021-11-05 DIAGNOSIS — E66813 Obesity, class 3: Secondary | ICD-10-CM | POA: Diagnosis present

## 2021-11-05 DIAGNOSIS — E871 Hypo-osmolality and hyponatremia: Secondary | ICD-10-CM | POA: Diagnosis present

## 2021-11-05 DIAGNOSIS — I420 Dilated cardiomyopathy: Secondary | ICD-10-CM | POA: Diagnosis not present

## 2021-11-05 DIAGNOSIS — E1165 Type 2 diabetes mellitus with hyperglycemia: Secondary | ICD-10-CM | POA: Diagnosis present

## 2021-11-05 DIAGNOSIS — I160 Hypertensive urgency: Secondary | ICD-10-CM | POA: Diagnosis present

## 2021-11-05 DIAGNOSIS — I5022 Chronic systolic (congestive) heart failure: Secondary | ICD-10-CM | POA: Diagnosis present

## 2021-11-05 DIAGNOSIS — R471 Dysarthria and anarthria: Secondary | ICD-10-CM | POA: Diagnosis present

## 2021-11-05 DIAGNOSIS — I63411 Cerebral infarction due to embolism of right middle cerebral artery: Secondary | ICD-10-CM | POA: Diagnosis present

## 2021-11-05 DIAGNOSIS — E878 Other disorders of electrolyte and fluid balance, not elsewhere classified: Secondary | ICD-10-CM | POA: Diagnosis present

## 2021-11-05 DIAGNOSIS — Z86711 Personal history of pulmonary embolism: Secondary | ICD-10-CM | POA: Diagnosis not present

## 2021-11-05 DIAGNOSIS — Z7952 Long term (current) use of systemic steroids: Secondary | ICD-10-CM

## 2021-11-05 DIAGNOSIS — Z713 Dietary counseling and surveillance: Secondary | ICD-10-CM

## 2021-11-05 DIAGNOSIS — I639 Cerebral infarction, unspecified: Secondary | ICD-10-CM | POA: Diagnosis present

## 2021-11-05 DIAGNOSIS — Z79899 Other long term (current) drug therapy: Secondary | ICD-10-CM

## 2021-11-05 DIAGNOSIS — T380X5A Adverse effect of glucocorticoids and synthetic analogues, initial encounter: Secondary | ICD-10-CM | POA: Diagnosis present

## 2021-11-05 DIAGNOSIS — E1169 Type 2 diabetes mellitus with other specified complication: Secondary | ICD-10-CM

## 2021-11-05 DIAGNOSIS — I441 Atrioventricular block, second degree: Secondary | ICD-10-CM | POA: Diagnosis present

## 2021-11-05 DIAGNOSIS — I5020 Unspecified systolic (congestive) heart failure: Secondary | ICD-10-CM | POA: Diagnosis not present

## 2021-11-05 DIAGNOSIS — Z794 Long term (current) use of insulin: Secondary | ICD-10-CM | POA: Diagnosis not present

## 2021-11-05 DIAGNOSIS — E119 Type 2 diabetes mellitus without complications: Secondary | ICD-10-CM

## 2021-11-05 LAB — URINALYSIS, ROUTINE W REFLEX MICROSCOPIC
Bacteria, UA: NONE SEEN
Bilirubin Urine: NEGATIVE
Glucose, UA: >=500 mg/dL — AB
Hgb urine dipstick: NEGATIVE
Ketones, ur: NEGATIVE mg/dL
Leukocytes,Ua: NEGATIVE
Nitrite: NEGATIVE
Protein, ur: 30 mg/dL — AB
Specific Gravity, Urine: 1.03 (ref 1.005–1.030)
pH: 5 (ref 5.0–8.0)

## 2021-11-05 LAB — PROTIME-INR
INR: 1.1 (ref 0.8–1.2)
Prothrombin Time: 13.9 seconds (ref 11.4–15.2)

## 2021-11-05 LAB — COMPREHENSIVE METABOLIC PANEL
ALT: 19 U/L (ref 0–44)
AST: 31 U/L (ref 15–41)
Albumin: 4.1 g/dL (ref 3.5–5.0)
Alkaline Phosphatase: 89 U/L (ref 38–126)
Anion gap: 12 (ref 5–15)
BUN: 18 mg/dL (ref 6–20)
CO2: 29 mmol/L (ref 22–32)
Calcium: 9.4 mg/dL (ref 8.9–10.3)
Chloride: 92 mmol/L — ABNORMAL LOW (ref 98–111)
Creatinine, Ser: 1.14 mg/dL (ref 0.61–1.24)
GFR, Estimated: >60 mL/min (ref >60)
Glucose, Bld: 261 mg/dL — ABNORMAL HIGH (ref 70–99)
Potassium: 3.4 mmol/L — ABNORMAL LOW (ref 3.5–5.1)
Sodium: 133 mmol/L — ABNORMAL LOW (ref 135–145)
Total Bilirubin: 1.2 mg/dL (ref 0.3–1.2)
Total Protein: 8.8 g/dL — ABNORMAL HIGH (ref 6.5–8.1)

## 2021-11-05 LAB — URINE DRUG SCREEN, QUALITATIVE (ARMC ONLY)
Amphetamines, Ur Screen: NOT DETECTED
Barbiturates, Ur Screen: NOT DETECTED
Benzodiazepine, Ur Scrn: NOT DETECTED
Cannabinoid 50 Ng, Ur ~~LOC~~: NOT DETECTED
Cocaine Metabolite,Ur ~~LOC~~: NOT DETECTED
MDMA (Ecstasy)Ur Screen: NOT DETECTED
Methadone Scn, Ur: NOT DETECTED
Opiate, Ur Screen: NOT DETECTED
Phencyclidine (PCP) Ur S: NOT DETECTED
Tricyclic, Ur Screen: NOT DETECTED

## 2021-11-05 LAB — CBC WITH DIFFERENTIAL/PLATELET
Abs Immature Granulocytes: 0.02 10*3/uL (ref 0.00–0.07)
Basophils Absolute: 0 10*3/uL (ref 0.0–0.1)
Basophils Relative: 0 %
Eosinophils Absolute: 0.1 10*3/uL (ref 0.0–0.5)
Eosinophils Relative: 1 %
HCT: 48.8 % (ref 39.0–52.0)
Hemoglobin: 16.5 g/dL (ref 13.0–17.0)
Immature Granulocytes: 0 %
Lymphocytes Relative: 30 %
Lymphs Abs: 3 10*3/uL (ref 0.7–4.0)
MCH: 29.3 pg (ref 26.0–34.0)
MCHC: 33.8 g/dL (ref 30.0–36.0)
MCV: 86.5 fL (ref 80.0–100.0)
Monocytes Absolute: 0.9 10*3/uL (ref 0.1–1.0)
Monocytes Relative: 9 %
Neutro Abs: 6 10*3/uL (ref 1.7–7.7)
Neutrophils Relative %: 60 %
Platelets: 329 10*3/uL (ref 150–400)
RBC: 5.64 MIL/uL (ref 4.22–5.81)
RDW: 13.3 % (ref 11.5–15.5)
WBC: 10 10*3/uL (ref 4.0–10.5)
nRBC: 0 % (ref 0.0–0.2)

## 2021-11-05 LAB — TROPONIN I (HIGH SENSITIVITY)
Troponin I (High Sensitivity): 22 ng/L — ABNORMAL HIGH (ref <18)
Troponin I (High Sensitivity): 25 ng/L — ABNORMAL HIGH (ref <18)

## 2021-11-05 LAB — TSH: TSH: 1.934 u[IU]/mL (ref 0.350–4.500)

## 2021-11-05 LAB — RESP PANEL BY RT-PCR (FLU A&B, COVID) ARPGX2
Influenza A by PCR: NEGATIVE
Influenza B by PCR: NEGATIVE
SARS Coronavirus 2 by RT PCR: NEGATIVE

## 2021-11-05 MED ORDER — ACETAMINOPHEN 325 MG PO TABS: 650.0000 mg | ORAL_TABLET | Freq: Four times a day (QID) | ORAL | Status: DC | PRN

## 2021-11-05 MED ORDER — ONDANSETRON HCL 4 MG PO TABS: 4.0000 mg | ORAL_TABLET | Freq: Four times a day (QID) | ORAL | Status: DC | PRN

## 2021-11-05 MED ORDER — SACUBITRIL-VALSARTAN 24-26 MG PO TABS
1.0000 | ORAL_TABLET | Freq: Two times a day (BID) | ORAL | Status: DC
  Administered 2021-11-06 – 2021-11-09 (×7): 1 via ORAL
  Filled 2021-11-05 (×11): qty 1

## 2021-11-05 MED ORDER — APIXABAN 5 MG PO TABS
5.0000 mg | ORAL_TABLET | Freq: Two times a day (BID) | ORAL | Status: DC
  Administered 2021-11-06 (×2): 5 mg via ORAL
  Filled 2021-11-05 (×2): qty 1

## 2021-11-05 MED ORDER — TRAZODONE HCL 50 MG PO TABS: 25.0000 mg | ORAL_TABLET | Freq: Every evening | ORAL | Status: DC | PRN

## 2021-11-05 MED ORDER — STROKE: EARLY STAGES OF RECOVERY BOOK: Freq: Once | Status: DC

## 2021-11-05 MED ORDER — MAGNESIUM HYDROXIDE 400 MG/5ML PO SUSP
30.0000 mL | Freq: Every day | ORAL | Status: DC | PRN
  Filled 2021-11-05: qty 30

## 2021-11-05 MED ORDER — ASPIRIN EC 81 MG PO TBEC
81.0000 mg | DELAYED_RELEASE_TABLET | Freq: Every day | ORAL | Status: DC
  Administered 2021-11-06 – 2021-11-09 (×4): 81 mg via ORAL
  Filled 2021-11-05 (×4): qty 1

## 2021-11-05 MED ORDER — TORSEMIDE 20 MG PO TABS
40.0000 mg | ORAL_TABLET | Freq: Two times a day (BID) | ORAL | Status: DC
  Administered 2021-11-06 – 2021-11-09 (×8): 40 mg via ORAL
  Filled 2021-11-05 (×9): qty 2

## 2021-11-05 MED ORDER — ONDANSETRON HCL 4 MG/2ML IJ SOLN: 4.0000 mg | Freq: Four times a day (QID) | INTRAMUSCULAR | Status: DC | PRN

## 2021-11-05 MED ORDER — ADULT MULTIVITAMIN W/MINERALS CH
1.0000 | ORAL_TABLET | Freq: Every day | ORAL | Status: DC
  Administered 2021-11-06 – 2021-11-09 (×4): 1 via ORAL
  Filled 2021-11-05 (×4): qty 1

## 2021-11-05 MED ORDER — CARVEDILOL 6.25 MG PO TABS
6.2500 mg | ORAL_TABLET | Freq: Two times a day (BID) | ORAL | Status: DC
  Administered 2021-11-06 – 2021-11-09 (×8): 6.25 mg via ORAL
  Filled 2021-11-05 (×8): qty 1

## 2021-11-05 MED ORDER — PREDNISONE 20 MG PO TABS
20.0000 mg | ORAL_TABLET | Freq: Two times a day (BID) | ORAL | Status: DC
  Administered 2021-11-06 – 2021-11-08 (×5): 20 mg via ORAL
  Filled 2021-11-05 (×5): qty 1

## 2021-11-05 MED ORDER — SPIRONOLACTONE 25 MG PO TABS
12.5000 mg | ORAL_TABLET | Freq: Every day | ORAL | Status: DC
  Administered 2021-11-06 – 2021-11-09 (×4): 12.5 mg via ORAL
  Filled 2021-11-05: qty 0.5
  Filled 2021-11-05 (×2): qty 1
  Filled 2021-11-05 (×2): qty 0.5
  Filled 2021-11-05: qty 1
  Filled 2021-11-05 (×3): qty 0.5
  Filled 2021-11-05: qty 1

## 2021-11-05 MED ORDER — ACETAMINOPHEN 650 MG RE SUPP: 650.0000 mg | Freq: Four times a day (QID) | RECTAL | Status: DC | PRN

## 2021-11-05 MED ORDER — BIOTIN 1000 MCG PO TABS: 1000.0000 ug | ORAL_TABLET | Freq: Every day | ORAL | Status: DC

## 2021-11-05 MED ORDER — SODIUM CHLORIDE 0.9 % IV SOLN: INTRAVENOUS | Status: DC

## 2021-11-05 NOTE — ED Notes (Addendum)
Pt transported to MRI via stretcher.  

## 2021-11-05 NOTE — ED Provider Notes (Signed)
[facility] Emergency Department Provider Note ____________________________________________   Event Date/Time   First MD Initiated Contact with Patient 11/05/21 2010     (approximate)  I have reviewed the triage vital signs and the nursing notes.  HISTORY  Chief Complaint Difficulty writing and speaking   HPI Robert Mccann is a 25 y.o. male for a little over 2 days now has noticed that he has some difficulty forming words seems little delayed in his responses, speech seems just slightly abnormal and his vision seems a little off  3 days ago he had a bit of a right-sided headache and noticed that he was having difficulty writing and reading.  His headache is since gone away but he is continued to have slowness in his verbal responses a slight difference in his speech as well as difficulty with vision seems like it is hard to focus.  He has been ongoing for greater than 24 hours  He has a history of a blood clot.  He missed Eliquis for 2 days just before the symptoms started, but has since restarted and is now taking Eliquis once again as prescribed and took it this morning and also took 325 mg aspirin this morning  No chest pain or trouble breathing.  Does have a history of a blood clot.  Mother has had a stroke recently.  I discussed with his mother who was on speaker phone throughout our evaluation Past Medical History:  Diagnosis Date   AV block, Mobitz 2    a. 05/2021 noted during periods of sleep @ time of hospitalization for PE.   Cardiomyopathy (HCC)    a. 05/2021 Echo: EF <20%, glob HK. [place]:C ratio of 2.1:1 suggesting non-compaction. Gr2 DD. Sev reduced RV fxn. Nl PASP. Mildly dil RA. Mild MR.   Diabetes mellitus without complication (HCC)    HFrEF (heart failure with reduced ejection fraction) (HCC)    a. 05/2021 Echo: EF <20%. ? non-compaction.   Hypertension    Pulmonary embolism (HCC)    a. 05/2021 CTA Chest: PE extending from lobar arteris of  RUL and RLL w/ concern for pulml infarct-->s/p thrombolysis and thrombectomy of RUL/RML/RLL.    Patient Active Problem List   Diagnosis Date Noted   CVA (cerebral vascular accident) (HCC) 11/05/2021   PE (pulmonary thromboembolism) (HCC) 06/28/2021   HFrEF (heart failure with reduced ejection fraction) (HCC)    Acute pulmonary embolism with acute cor pulmonale (HCC)    Obesity, Class III, BMI 40-49.9 (morbid obesity) (HCC)    DM II (diabetes mellitus, type II), controlled (HCC) 06/27/2021   Chest pain 06/27/2021   HTN (hypertension) 06/27/2021   Malaise 05/25/2014   Routine history and physical examination of adult 05/25/2014   Obesity 05/25/2014    Past Surgical History:  Procedure Laterality Date   HAND SURGERY Left age 18   HAND SURGERY     PULMONARY THROMBECTOMY N/A 06/28/2021   Procedure: PULMONARY THROMBECTOMY;  Surgeon: Annice Needy, MD;  Location: [facility] INVASIVE CV LAB;  Service: Cardiovascular;  Laterality: N/A;    Prior to Admission medications   Medication Sig Start Date End Date Taking? Authorizing Provider  apixaban (ELIQUIS) 5 MG TABS tablet Take 5 mg by mouth 2 (two) times daily.    [provider]  benzonatate (TESSALON) 100 MG capsule Take 1 capsule (100 mg total) by mouth every 8 (eight) hours. 07/18/21   Theron Arista, PA-C  Biotin 1000 MCG tablet Take 1 tablet (1 mg total) by mouth  daily. Chew 1 gummy daily. 06/30/21   Enedina Finner, MD  carvedilol (COREG) 6.25 MG tablet Take 1 tablet (6.25 mg total) by mouth 2 (two) times daily with a meal. 06/30/21   Enedina Finner, MD  doxycycline (VIBRAMYCIN) 100 MG capsule Take 1 capsule (100 mg total) by mouth 2 (two) times daily. One po bid x 7 days 10/06/21   Geoffery Lyons, MD  metFORMIN (GLUCOPHAGE) 500 MG tablet Take 1 tablet (500 mg total) by mouth 2 (two) times daily. 06/30/21   Enedina Finner, MD  Multiple Vitamin (ONE-A-DAY MENS PO) Take 1 tablet by mouth daily.    [provider]  predniSONE (DELTASONE) 10 MG tablet  Take 2 tablets (20 mg total) by mouth 2 (two) times daily with a meal. 10/06/21   Delo, Riley Lam, MD  sacubitril-valsartan (ENTRESTO) 24-26 MG Take 1 tablet by mouth 2 (two) times daily. 07/06/21   Creig Hines, NP  spironolactone (ALDACTONE) 25 MG tablet Take 0.5 tablets (12.5 mg total) by mouth daily. 09/15/21 12/14/21  Debbe Odea, MD  torsemide (DEMADEX) 20 MG tablet Take 2 tablets (40 mg total) by mouth 2 (two) times daily. 09/15/21 12/14/21  Debbe Odea, MD    Allergies Patient has no known allergies.  Family History  Problem Relation Age of Onset   Heart failure Mother    Diabetes Father    Hypertension Father     Social History Social History   Tobacco Use   Smoking status: Never   Smokeless tobacco: Never  Vaping Use   Vaping Use: Never used  Substance Use Topics   Alcohol use: Not Currently    Comment: special occasions   Drug use: Yes    Types: Marijuana    Comment: 6 days ago    Review of Systems Constitutional: No fever/chills Eyes: Seemingly blurry sense of ENT: No sore throat. Cardiovascular: Denies chest pain.  Earlier today he felt a slight sensation of a chest pressure but that is gone away completely Respiratory: Denies shortness of breath. Gastrointestinal: No abdominal pain.   Genitourinary: Negative for dysuria. Musculoskeletal: Negative for back pain. Skin: Negative for rash. Neurological: Negative for  focal weakness.  He is experiencing some numbness over his left hand for 2 to 3 days    ____________________________________________   PHYSICAL EXAM:  VITAL SIGNS: ED Triage Vitals  Enc Vitals Group     BP 11/05/21 1726 (!) 142/103     Pulse Rate 11/05/21 1726 (!) 103     Resp 11/05/21 1726 16     Temp 11/05/21 1726 98.4 F (36.9 C)     Temp Source 11/05/21 1726 Oral     SpO2 11/05/21 1726 98 %     Weight 11/05/21 1727 (!) 303 lb 9.6 oz (137.7 kg)     Height 11/05/21 1727 5\' 10"  (1.778 m)     Head Circumference  --      Peak Flow --      Pain Score 11/05/21 1726 7     Pain Loc --      Pain Edu? --      Excl. in GC? --     Constitutional: Alert and oriented. Well appearing and in no acute distress.  He is very pleasant, mother on speaker phone also very pleasant. Eyes: Conjunctivae are normal. Head: Atraumatic. Nose: No congestion/rhinnorhea. Mouth/Throat: Mucous membranes are moist. Neck: No stridor.  Cardiovascular: Normal rate, regular rhythm. Grossly normal heart sounds.  Good peripheral circulation. Respiratory: Normal respiratory effort.  No retractions.  Lungs CTAB. Gastrointestinal: Soft and nontender. No distention. Musculoskeletal: No lower extremity tenderness nor edema. Neurologic:  Normal speech and language though he does seem slightly intentional or a little bit delayed in his responsiveness to questions.  Slight occasional dysarthria detected but minimal.  Slight numbness over the left hand.  5 out of 5 strength in all extremities.  No cranial nerve deficits to noted.  No gross focal neurologic deficits are appreciated, but on detailed exam seems to have some numbness left hand and slowness in his responses and perhaps a very slight dysarthria or perhaps an even very mild expressive aphasia somewhat hard to delineate the 2 at this point Skin:  Skin is warm, dry and intact. No rash noted. Psychiatric: Mood and affect are normal. Speech and behavior are normal.  ____________________________________________   LABS (all labs ordered are listed, but only abnormal results are displayed)  Labs Reviewed  COMPREHENSIVE METABOLIC PANEL - Abnormal; Notable for the following components:      Result Value   Sodium 133 (*)    Potassium 3.4 (*)    Chloride 92 (*)    Glucose, Bld 261 (*)    Total Protein 8.8 (*)    All other components within normal limits  TROPONIN I (HIGH SENSITIVITY) - Abnormal; Notable for the following components:   Troponin I (High Sensitivity) 22 (*)    All other  components within normal limits  RESP PANEL BY RT-PCR (FLU A&B, COVID) ARPGX2  CBC WITH DIFFERENTIAL/PLATELET  TSH  URINALYSIS, ROUTINE W REFLEX MICROSCOPIC  URINE DRUG SCREEN, QUALITATIVE ([facility] ONLY)  PROTIME-INR  CBG MONITORING, ED  TROPONIN I (HIGH SENSITIVITY)   ____________________________________________  EKG  Reviewed inter by me at 1722 Heart rate 105 QRS 99 QTc 460 Sinus tachycardia, LVH.  Repolarization abnormalities noted. Morphologies appear similar to September 15, 2021 the rate has increased today ____________________________________________  RADIOLOGY  CT Head Wo Contrast  Result Date: 11/05/2021 CLINICAL DATA:  Mental status change, unknown cause. Stroke-like symptoms. Speech disturbance. Left-sided numbness. EXAM: CT HEAD WITHOUT CONTRAST TECHNIQUE: Contiguous axial images were obtained from the base of the skull through the vertex without intravenous contrast. COMPARISON:  None. FINDINGS: Brain: A moderate-sized region of hypoattenuation involving cortex and white matter in the temporoparietal region is consistent with an acute to subacute infarct. Additional smaller infarcts are present in the right greater than left occipital lobes and have a generally older appearance than the temporoparietal infarct. Hypodensity in the inferior left temporal lobe is favored to reflect artifact based on sagittal reformats with edema/infarct considered less likely. No acute intracranial hemorrhage, midline shift, or extra-axial fluid collection is identified. Vascular: No hyperdense vessel. Skull: No fracture or suspicious osseous lesion. Sinuses/Orbits: Visualized paranasal sinuses and mastoid air cells are clear. Unremarkable orbits. Other: None. IMPRESSION: 1. Bilateral cerebral infarcts including a moderate-sized acute to subacute right temporoparietal infarct. 2. No acute intracranial hemorrhage. Electronically Signed   By: Sebastian Ache M.D.   On: 11/05/2021 18:00     CT head  reviewed notable for by lateral cerebral infarcts including acute to subacute right temporoparietal infarct.  No hemorrhage ____________________________________________   PROCEDURES  Procedure(s) performed: None  Procedures  Critical Care performed: No  ____________________________________________   INITIAL IMPRESSION / ASSESSMENT AND PLAN / ED COURSE  Pertinent labs & imaging results that were available during my care of the patient were reviewed by me and considered in my medical decision making (see chart for details).   Patient presents with new concerning neurologic  symptoms starting greater than 24 hours ago, namely somewhere in the 48 to 72-hour range.  He is currently back on his anticoagulation but did miss a couple days preceding the symptoms starting.  He is on Eliquis took a dose this morning and also he reports he took 325 mg of aspirin today as well.  He is outside of any treatment window for acute stroke or LVO.  However certainly I feel that he needs admission to the hospital for further management work-up including consideration for a possible hypercoagulable state type work-up given his association of prior pulmonary embolism    Patient is well outside of any acute stroke window, symptoms starting over 24 hours ago.  He has taken his Eliquis this morning and also took aspirin 325 mg this morning as well  He did have a brief feeling of chest tightness but no shortness of breath.  Clinically I do not find evidence to suggest a recurrence or acute PE, rather I am quite concerned about arterial embolic type phenomenon.   ----------------------------------------- 8:31 PM on 11/05/2021 -----------------------------------------  Discussed with both patient and his mother both understanding agreeable with plan for admission for further care management and work-up of his symptoms.  Admission discussed with hospitalist Dr.  Arville Care ____________________________________________   FINAL CLINICAL IMPRESSION(S) / ED DIAGNOSES  Final diagnoses:  Cerebrovascular accident (CVA), unspecified mechanism (HCC)        Note:  This document was prepared using Dragon voice recognition software and may include unintentional dictation errors       Sharyn Creamer, MD 11/05/21 2039

## 2021-11-05 NOTE — H&P (Signed)
[name]   PATIENT NAME: Robert Mccann    MR#:  [id number]  DATE OF BIRTH:  December 30, 1996  DATE OF ADMISSION:  11/05/2021  PRIMARY CARE PHYSICIAN: Ellene Route   Patient is coming from: Home  REQUESTING/REFERRING PHYSICIAN: Delman Kitten, MD  CHIEF COMPLAINT:  No chief complaint on file. Slurred speech, left hand numbness  HISTORY OF PRESENT ILLNESS:  Robert Mccann is a 25 y.o. African-American male with medical history significant for hypertension, HFrEF with cardiomyopathy, type 2 diabetes mellitus and pulmonary embolism on Eliquis, who presented to the emergency room with acute onset of dysarthria and expressive dysphasia as well as left arm numbness and mild weakness for the last 2 to 3 days with a feeling of left leg warmth without numbness or weakness.  He thought he had mild unsteady gait but was able to ambulate.  He admitted to right-sided headache for a couple of days.  He admitted to blurred vision without dizziness and denied presyncope or syncope.  No witnessed seizures.  He stated that he had a problem with distant calculation.  He denied any chest pain or palpitations.  No fever or chills.  No cough or wheezing or dyspnea.  No urinary or stool incontinence.  No tinnitus or vertigo.  No dysuria, oliguria or hematuria or flank pain.  ED Course: Upon presentation to the ER, blood pressure was 142/103 with a heart rate of 103 and otherwise unremarkable.  Labs revealed hypokalemia of 3.4 and mild hyponatremia and hypochloremia, blood glucose of 261.  High-sensitivity troponin was 22.  CBC was within normal.  TSH was 1.93.  Influenza antigens and COVID-19 PCR came back negative.    EKG as reviewed by me : EKG showed sinus tachycardia with rate of 105 with right atrial enlargement and LVH. Imaging: Noncontrasted CT scan revealed bilateral cerebral infarcts including a moderate-sized acute to subacute right temporoparietal infarct with no acute intracranial  hemorrhage.  The patient took aspirin 325 mg p.o. this morning.  He will be admitted to a medical monitored bed for further evaluation and management. PAST MEDICAL HISTORY:   Past Medical History:  Diagnosis Date  . AV block, Mobitz 2    a. 05/2021 noted during periods of sleep @ time of hospitalization for PE.  . Cardiomyopathy ([place])    a. 05/2021 Echo: EF <20%, glob HK. :C ratio of 2.1:1 suggesting non-compaction. Gr2 DD. Sev reduced RV fxn. Nl PASP. Mildly dil RA. Mild MR.  . Diabetes mellitus without complication ([place])   . HFrEF (heart failure with reduced ejection fraction) ([place])    a. 05/2021 Echo: EF <20%. ? non-compaction.  . Hypertension   . Pulmonary embolism ([place])    a. 05/2021 CTA Chest: PE extending from lobar arteris of RUL and RLL w/ concern for pulml infarct-->s/p thrombolysis and thrombectomy of RUL/RML/RLL.    PAST SURGICAL HISTORY:   Past Surgical History:  Procedure Laterality Date  . HAND SURGERY Left age 9  . HAND SURGERY    . PULMONARY THROMBECTOMY N/A 06/28/2021   Procedure: PULMONARY THROMBECTOMY;  Surgeon: Algernon Huxley, MD;  Location: [facility];  Service: Cardiovascular;  Laterality: N/A;    SOCIAL HISTORY:   Social History   Tobacco Use  . Smoking status: Never  . Smokeless tobacco: Never  Substance Use Topics  . Alcohol use: Not Currently    Comment: special occasions    FAMILY HISTORY:   Family History  Problem Relation Age of Onset  .  Heart failure Mother   . Diabetes Father   . Hypertension Father     DRUG ALLERGIES:  No Known Allergies  REVIEW OF SYSTEMS:   ROS As per history of present illness. All pertinent systems were reviewed above. Constitutional, HEENT, cardiovascular, respiratory, GI, GU, musculoskeletal, neuro, psychiatric, endocrine, integumentary and hematologic systems were reviewed and are otherwise negative/unremarkable except for positive findings mentioned above in the HPI.   MEDICATIONS AT HOME:    Prior to Admission medications   Medication Sig Start Date End Date Taking? Authorizing Provider  apixaban (ELIQUIS) 5 MG TABS tablet Take 5 mg by mouth 2 (two) times daily.    [provider]  benzonatate (TESSALON) 100 MG capsule Take 1 capsule (100 mg total) by mouth every 8 (eight) hours. 07/18/21   Sherrill Raring, PA-C  Biotin 1000 MCG tablet Take 1 tablet (1 mg total) by mouth daily. Chew 1 gummy daily. 06/30/21   Fritzi Mandes, MD  carvedilol (COREG) 6.25 MG tablet Take 1 tablet (6.25 mg total) by mouth 2 (two) times daily with a meal. 06/30/21   Fritzi Mandes, MD  doxycycline (VIBRAMYCIN) 100 MG capsule Take 1 capsule (100 mg total) by mouth 2 (two) times daily. One po bid x 7 days 10/06/21   Veryl Speak, MD  metFORMIN (GLUCOPHAGE) 500 MG tablet Take 1 tablet (500 mg total) by mouth 2 (two) times daily. 06/30/21   Fritzi Mandes, MD  Multiple Vitamin (ONE-A-DAY MENS PO) Take 1 tablet by mouth daily.    [provider]  predniSONE (DELTASONE) 10 MG tablet Take 2 tablets (20 mg total) by mouth 2 (two) times daily with a meal. 10/06/21   Delo, Nathaneil Canary, MD  sacubitril-valsartan (ENTRESTO) 24-26 MG Take 1 tablet by mouth 2 (two) times daily. 07/06/21   Theora Gianotti, NP  spironolactone (ALDACTONE) 25 MG tablet Take 0.5 tablets (12.5 mg total) by mouth daily. 09/15/21 12/14/21  Kate Sable, MD  torsemide (DEMADEX) 20 MG tablet Take 2 tablets (40 mg total) by mouth 2 (two) times daily. 09/15/21 12/14/21  Kate Sable, MD      VITAL SIGNS:  Blood pressure (!) 142/103, pulse (!) 103, temperature 98.4 F (36.9 C), temperature source Oral, resp. rate 16, height 5\' 10"  (1.778 m), weight (!) 137.7 kg, SpO2 98 %.  PHYSICAL EXAMINATION:  Physical Exam  GENERAL:  25 y.o.-year-old obese African-American male patient lying in the bed with no acute distress.  EYES: Pupils equal, round, reactive to light and accommodation. No scleral icterus. Extraocular muscles intact.  HEENT:  Head atraumatic, normocephalic. Oropharynx and nasopharynx clear.  NECK:  Supple, no jugular venous distention. No thyroid enlargement, no tenderness.  LUNGS: Normal breath sounds bilaterally, no wheezing, rales,rhonchi or crepitation. No use of accessory muscles of respiration.  CARDIOVASCULAR: Regular rate and rhythm, S1, S2 normal. No murmurs, rubs, or gallops.  ABDOMEN: Soft, nondistended, nontender. Bowel sounds present. No organomegaly or mass.  EXTREMITIES: No pedal edema, cyanosis, or clubbing.  NEUROLOGIC: Cranial nerves II through XII are intact except for slow speech. Muscle strength 5/5 in all extremities. Sensation intact. Gait not checked.  PSYCHIATRIC: The patient is alert and oriented x 3.  Normal affect and good eye contact. SKIN: No obvious rash, lesion, or ulcer.   LABORATORY PANEL:   CBC Recent Labs  Lab 11/05/21 1734  WBC 10.0  HGB 16.5  HCT 48.8  PLT 329   ------------------------------------------------------------------------------------------------------------------  Chemistries  Recent Labs  Lab 11/05/21 1734  NA 133*  K 3.4*  CL 92*  CO2 29  GLUCOSE 261*  BUN 18  CREATININE 1.14  CALCIUM 9.4  AST 31  ALT 19  ALKPHOS 89  BILITOT 1.2   ------------------------------------------------------------------------------------------------------------------  Cardiac Enzymes No results for input(s): TROPONINI in the last 168 hours. ------------------------------------------------------------------------------------------------------------------  RADIOLOGY:  CT Head Wo Contrast  Result Date: 11/05/2021 CLINICAL DATA:  Mental status change, unknown cause. Stroke-like symptoms. Speech disturbance. Left-sided numbness. EXAM: CT HEAD WITHOUT CONTRAST TECHNIQUE: Contiguous axial images were obtained from the base of the skull through the vertex without intravenous contrast. COMPARISON:  None. FINDINGS: Brain: A moderate-sized region of hypoattenuation  involving cortex and white matter in the temporoparietal region is consistent with an acute to subacute infarct. Additional smaller infarcts are present in the right greater than left occipital lobes and have a generally older appearance than the temporoparietal infarct. Hypodensity in the inferior left temporal lobe is favored to reflect artifact based on sagittal reformats with edema/infarct considered less likely. No acute intracranial hemorrhage, midline shift, or extra-axial fluid collection is identified. Vascular: No hyperdense vessel. Skull: No fracture or suspicious osseous lesion. Sinuses/Orbits: Visualized paranasal sinuses and mastoid air cells are clear. Unremarkable orbits. Other: None. IMPRESSION: 1. Bilateral cerebral infarcts including a moderate-sized acute to subacute right temporoparietal infarct. 2. No acute intracranial hemorrhage. Electronically Signed   By: Sebastian Ache M.D.   On: 11/05/2021 18:00      IMPRESSION AND PLAN:  Active Problems:   CVA (cerebral vascular accident) (HCC)  1.  Bilateral cerebral infarcts including moderate sized acute or subacute right temporoparietal infarction with associated dysarthria and expressive dysphagia as well as left arm numbness and weakness, resolving. - The patient will be admitted to a medical monitored bed. - We will follow neurochecks every 4 hours for 24 hours. - Obtain brain MRI without contrast. - We will repeat a 2D echo to rule out embolic etiology. - We will continue the patient's Eliquis. - We will continue statin therapy and check fasting lipids. - PT/OT and ST consults will be obtained. - Neurology consult to be obtained. - I notified Dr. Selina Cooley about the patient.  2.  Hypokalemia. - Potassium will be replaced and magnesium level will be checked.  3.  Uncontrolled type 2 diabetes mellitus with hyperglycemia. - The patient will be placed on supplement coverage with NovoLog. - We will hold off metformin and  Farxiga.  4.  Uncontrolled hypertension with early hypertensive urgency. - We will continue her antihypertensives with permissive parameters.  5.  HFrEF with cardiomyopathy. - We will continue his Demadex, r Aldactone, Entresto 2 and [place].  DVT prophylaxis: Eliquis.   Code Status: full code. Family Communication:  The plan of care was discussed in details with the patient (and family). I answered all questions. The patient agreed to proceed with the above mentioned plan. Further management will depend upon hospital course. Disposition Plan: Back to previous home environment Consults called: Neurology. All the records are reviewed and case discussed with ED provider.  Status is: Inpatient   Remains inpatient appropriate because:Ongoing diagnostic testing needed not appropriate for outpatient work up, Unsafe d/c plan, IV treatments appropriate due to intensity of illness or inability to take PO, and Inpatient level of care appropriate due to severity of illness   Dispo: The patient is from: Home              Anticipated d/c is to: Home              Patient currently is  not medically stable to d/c.              Difficult to place patient: No  TOTAL TIME TAKING CARE OF THIS PATIENT: 55 minutes.     Christel Mormon M.D on 11/05/2021 at 9:10 PM  [facility]   From 7 PM-7 AM, contact night-coverage [email or web address]  CC: Primary care physician; Ellene Route

## 2021-11-05 NOTE — ED Triage Notes (Signed)
Pt to ED via EMS from home, he has multiple medical complaints that started about 2 weeks ago. He is having chest pain, vision is different. He reports that when he was in the shower yesterday that he could not tell the difference from hot to cold. He reports that all food taste the same. He feels that he may have had a stroke because his mother had a stroke a month ago. He feels that his speech is delayed. Pt is speaking without any difficulty. He is not having any trouble getting any words out during triage.

## 2021-11-05 NOTE — ED Notes (Signed)
Patient remains in MRI. Secretary, Consulting civil engineer, and Architect all aware.

## 2021-11-05 NOTE — ED Notes (Addendum)
Hospitalist at the bedside 

## 2021-11-05 NOTE — ED Provider Notes (Signed)
Emergency Medicine Provider Triage Evaluation Note  Robert Mccann , a 25 y.o. male  was evaluated in triage.  Pt complains of altered sensation, chest pain, and vision changes. He also complains of delayed speech. Symptoms started 2-3 days ago.  Review of Systems  Positive: Altered sensation, chest pain, vision changes Negative: Fever, shortness of breath, cough  Physical Exam  BP (!) 142/103 (BP Location: Left Arm)   Pulse (!) 103   Temp 98.4 F (36.9 C) (Oral)   Resp 16   Ht 5\' 10"  (1.778 m)   Wt (!) 137.7 kg   SpO2 98%   BMI 43.56 kg/m  Gen:   Awake, no distress   Resp:  Normal effort  MSK:   Moves extremities without difficulty  Other:    Medical Decision Making  Medically screening exam initiated at 5:27 PM.  Appropriate orders placed.  was informed that the remainder of the evaluation will be completed by another provider, this initial triage assessment does not replace that evaluation, and the importance of remaining in the ED until their evaluation is complete.   [name], FNP 11/05/21 1954    13/06/22, MD 11/06/21 [phone]

## 2021-11-05 NOTE — ED Notes (Signed)
Patient return from MRI.

## 2021-11-05 NOTE — ED Triage Notes (Signed)
Pt comes ems from home with multiple complaints. Feels like his taste is off, he cannot tell hot from cold, and his speech is delayed. Pt also reports vomiting, dark urine, and fatigue. 119/55, HR 98, 100%. Thinks he may have had a stroke because his mother had one a month ago.

## 2021-11-05 NOTE — ED Notes (Signed)
Verified LKW with patient-patient states that his symptoms have been going on for 2-3 days. Pt states that his symptoms come back and forth. Last time symptoms officially started was today after he got off work at [employer]. Pt estimates this was about 9am with chest pain and vision changes. Verified with Dr Derrill Kay.

## 2021-11-05 NOTE — ED Notes (Addendum)
Patient placed on cardiac monitor, NSR noted. 

## 2021-11-06 ENCOUNTER — Inpatient Hospital Stay: Payer: BC Managed Care – PPO

## 2021-11-06 ENCOUNTER — Inpatient Hospital Stay: Admit: 2021-11-06 | Payer: BC Managed Care – PPO

## 2021-11-06 ENCOUNTER — Encounter: Payer: Self-pay | Admitting: Family Medicine

## 2021-11-06 ENCOUNTER — Inpatient Hospital Stay (HOSPITAL_COMMUNITY)
Admit: 2021-11-06 | Discharge: 2021-11-06 | Disposition: A | Discharge status: Home / Self Care | Payer: BC Managed Care – PPO | Attending: Family Medicine | Admitting: Family Medicine

## 2021-11-06 DIAGNOSIS — I63511 Cerebral infarction due to unspecified occlusion or stenosis of right middle cerebral artery: Secondary | ICD-10-CM

## 2021-11-06 DIAGNOSIS — E119 Type 2 diabetes mellitus without complications: Secondary | ICD-10-CM

## 2021-11-06 LAB — ECHOCARDIOGRAM COMPLETE
AR max vel: 2.44 cm2
AV Area VTI: 2.61 cm2
AV Area mean vel: 2.41 cm2
AV Mean grad: 2 mmHg
AV Peak grad: 3.5 mmHg
Ao pk vel: 0.93 m/s
Area-P 1/2: 6.37 cm2
Calc EF: 24.1 %
Height: 72 in
MV VTI: 2.74 cm2
S' Lateral: 6.19 cm
Single Plane A2C EF: 13.7 %
Single Plane A4C EF: 23.9 %
Weight: 4871.28 oz

## 2021-11-06 LAB — CBC
HCT: 48.5 % (ref 39.0–52.0)
Hemoglobin: 16.8 g/dL (ref 13.0–17.0)
MCH: 30.3 pg (ref 26.0–34.0)
MCHC: 34.6 g/dL (ref 30.0–36.0)
MCV: 87.4 fL (ref 80.0–100.0)
Platelets: 285 10*3/uL (ref 150–400)
RBC: 5.55 MIL/uL (ref 4.22–5.81)
RDW: 13.4 % (ref 11.5–15.5)
WBC: 7.6 10*3/uL (ref 4.0–10.5)
nRBC: 0 % (ref 0.0–0.2)

## 2021-11-06 LAB — HEMOGLOBIN A1C
Hgb A1c MFr Bld: 8.7 % — ABNORMAL HIGH (ref 4.8–5.6)
Mean Plasma Glucose: 202.99 mg/dL

## 2021-11-06 LAB — BASIC METABOLIC PANEL
Anion gap: 9 (ref 5–15)
BUN: 15 mg/dL (ref 6–20)
CO2: 29 mmol/L (ref 22–32)
Calcium: 10.2 mg/dL (ref 8.9–10.3)
Chloride: 98 mmol/L (ref 98–111)
Creatinine, Ser: 0.89 mg/dL (ref 0.61–1.24)
GFR, Estimated: >60 mL/min (ref >60)
Glucose, Bld: 214 mg/dL — ABNORMAL HIGH (ref 70–99)
Potassium: 3.1 mmol/L — ABNORMAL LOW (ref 3.5–5.1)
Sodium: 136 mmol/L (ref 135–145)

## 2021-11-06 LAB — LIPID PANEL
Cholesterol: 231 mg/dL — ABNORMAL HIGH (ref 0–200)
HDL: 51 mg/dL (ref >40)
LDL Cholesterol: 151 mg/dL — ABNORMAL HIGH (ref 0–99)
Total CHOL/HDL Ratio: 4.5 RATIO
Triglycerides: 143 mg/dL (ref <150)
VLDL: 29 mg/dL (ref 0–40)

## 2021-11-06 LAB — GLUCOSE, CAPILLARY
Glucose-Capillary: 170 mg/dL — ABNORMAL HIGH (ref 70–99)
Glucose-Capillary: 219 mg/dL — ABNORMAL HIGH (ref 70–99)
Glucose-Capillary: 249 mg/dL — ABNORMAL HIGH (ref 70–99)
Glucose-Capillary: 254 mg/dL — ABNORMAL HIGH (ref 70–99)
Glucose-Capillary: 326 mg/dL — ABNORMAL HIGH (ref 70–99)

## 2021-11-06 MED ORDER — ATORVASTATIN CALCIUM 20 MG PO TABS
80.0000 mg | ORAL_TABLET | Freq: Every day | ORAL | Status: DC
  Administered 2021-11-06 – 2021-11-09 (×4): 80 mg via ORAL
  Filled 2021-11-06 (×4): qty 4

## 2021-11-06 MED ORDER — INSULIN ASPART 100 UNIT/ML IJ SOLN
0.0000 [IU] | Freq: Three times a day (TID) | INTRAMUSCULAR | Status: DC
  Administered 2021-11-06 (×2): 3 [IU] via SUBCUTANEOUS
  Administered 2021-11-06: 5 [IU] via SUBCUTANEOUS
  Administered 2021-11-07: 3 [IU] via SUBCUTANEOUS
  Administered 2021-11-07 – 2021-11-08 (×3): 5 [IU] via SUBCUTANEOUS
  Administered 2021-11-08: 17:00:00 9 [IU] via SUBCUTANEOUS
  Filled 2021-11-06 (×8): qty 1

## 2021-11-06 MED ORDER — POTASSIUM CHLORIDE 20 MEQ PO PACK
40.0000 meq | PACK | ORAL | Status: AC
  Administered 2021-11-06 (×2): 40 meq via ORAL
  Filled 2021-11-06 (×2): qty 2

## 2021-11-06 MED ORDER — POTASSIUM CHLORIDE 10 MEQ/100ML IV SOLN
10.0000 meq | INTRAVENOUS | Status: DC
  Filled 2021-11-06: qty 100

## 2021-11-06 MED ORDER — POTASSIUM CHLORIDE 10 MEQ/100ML IV SOLN
10.0000 meq | Freq: Once | INTRAVENOUS | Status: AC
  Administered 2021-11-06: 09:00:00 10 meq via INTRAVENOUS

## 2021-11-06 MED ORDER — IOHEXOL 350 MG/ML SOLN
75.0000 mL | Freq: Once | INTRAVENOUS | Status: AC | PRN
  Administered 2021-11-06: 75 mL via INTRAVENOUS

## 2021-11-06 MED ORDER — ENOXAPARIN SODIUM 80 MG/0.8ML IJ SOSY
0.5000 mg/kg | PREFILLED_SYRINGE | INTRAMUSCULAR | Status: DC
  Administered 2021-11-06 – 2021-11-08 (×3): 70 mg via SUBCUTANEOUS
  Filled 2021-11-06 (×4): qty 0.7

## 2021-11-06 NOTE — Progress Notes (Signed)
Pt admitted to room 117 from ED with CVA.  Pt has had symptoms over the last couple of days.  NIH of 3 for decreased peripheral vision on the left, slight facial asymmetry of forehead and some word searching.  Speech is clear.  No swallowing issues noted.  Dr. Toniann Fail notified of change in NIH score from ED screening.  Pt is also a new diabetic as of June and CBG's with SSI to be ordered.  IVF will also be discontinued as pt has CHF.  Pt continues on Eliquis for history of PE's.  TOC and spiritual consults initiated.  Pt states he will send his wallet and medications home with his parents with they come later today.  Pt oriented to room and call bell.  He agrees to call for assistance getting out of bed.  Stroke booklet provided and is at bedside.  Admission completed and care plans initiated.  Hilton Sinclair BSN RN CMSRN    11/06/21 0100  NIH Stroke Scale ( + Modified Stroke Scale Criteria)   Interval Other (Comment) (Admission)  Level of Consciousness (1a.)    0  LOC Questions (1b. )   + 0  LOC Commands (1c. )   +  0  Best Gaze (2. )  + 0  Visual (3. )  + 1 (Left outer fields of vision decreased.)  Facial Palsy (4. )     1 (asymmetrical forehead wrinkling with eyebrow raising)  Motor Arm, Left (5a. )   + 0  Motor Arm, Right (5b. )   + 0  Motor Leg, Left (6a. )   + 0  Motor Leg, Right (6b. )   + 0  Limb Ataxia (7. ) 0  Sensory (8. )   + 0  Best Language (9. )   + 1 (some word searching and delayed responses)  Dysarthria (10. ) 0  Extinction/Inattention (11.)   + 0  Modified SS Total  + 2  Complete NIHSS TOTAL 3

## 2021-11-06 NOTE — Progress Notes (Signed)
*  PRELIMINARY RESULTS* Echocardiogram 2D Echocardiogram has been performed.  Cristela Blue 11/06/2021, 8:42 AM

## 2021-11-06 NOTE — Evaluation (Signed)
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 05/2021 noted during periods of sleep @ time of hospitalization for PE.   Cardiomyopathy ([place])    a. 05/2021 Echo: EF <20%, glob HK. [place]:C ratio of 2.1:1 suggesting non-compaction. Gr2 DD. Sev reduced RV fxn. Nl PASP. Mildly dil RA. Mild MR.   Diabetes mellitus without complication ([place])    HFrEF (heart failure with reduced ejection fraction) ([place])    a. 05/2021 Echo: EF <20%. ? non-compaction.   Hypertension    Pulmonary embolism ([place])    a. 05/2021 CTA Chest: PE extending from lobar arteris of RUL and RLL w/ concern for pulml infarct-->s/p thrombolysis and thrombectomy of RUL/RML/RLL.   Past Surgical History:  Past Surgical History:  Procedure Laterality Date   HAND SURGERY Left age 25   HAND SURGERY     PULMONARY THROMBECTOMY N/A 06/28/2021   Procedure: PULMONARY THROMBECTOMY;  Surgeon: Algernon Huxley, MD;  Location: [facility];  Service: Cardiovascular;  Laterality: N/A;   HPI:  Per H&P "Robert Mccann is a 25 y.o. African-American male with medical history significant for hypertension, HFrEF  with cardiomyopathy, type 2 diabetes mellitus and pulmonary embolism on Eliquis, who presented to the emergency room with acute onset of dysarthria and expressive dysphasia as well as left arm numbness and mild weakness for the last 2 to 3 days with a feeling of left leg warmth without numbness or weakness.  He thought he had mild unsteady gait but was able to ambulate.  He admitted to right-sided headache for a couple of days.  He admitted to blurred vision without dizziness and denied presyncope or syncope.  No witnessed seizures.  He stated that he had a problem with distant calculation.  He denied any chest pain or palpitations.  No fever or chills.  No cough or wheezing or dyspnea.  No urinary or stool incontinence.  No tinnitus or vertigo.  No dysuria, oliguria or hematuria or flank pain."   Assessment / Plan / Recommendation Clinical Impression  Pt seen for cognitive-linguistic evaluation. Pt presents with mildly impaired verbal expression, immediate memory, and working memory. Pt alert and oriented x4 (with exception of date). Pt's speech is clear with no s/sx dysarthria. Although pt reported feeling like pt's speech was "slower than usual" which was not appreciated perceptually by SLP. Pt with mainly fluent speech with occasional s/sx anomia and phonemic paraphasis. Pt with good awareness of speech errors and emerging independent repair of communication breakdowns. Pt demonstrated intact functional auditory comprehension. Pt with intact confrontation naming. Mild difficulty with repetition tasks; however, pt able to self correct errors. Pt with reduced immediate recall and working memory  for basic calculations.  Pt endorses improvement in cognitive-linguistic functioning since admission; however, endorses he is not at this baseline. Pt highly motivated to improve cognitive-linguistic functioning and is requesting SLP services at d/c.   Recommend SLP services acute/post-acute for cognitive-linguistic  deficits. Anticipate need for initial supervision at d/c given current deficits. SLP to continue to follow on acute for diagnostic tx.   Pt, RN, and Case Management made aware of results, recommendations, and POC. Pt verbalized understanding/agreement.    SLP Assessment  SLP Recommendation/Assessment: Patient needs continued Speech Lanaguage Pathology Services SLP Visit Diagnosis: Cognitive communication deficit (R41.841)    Recommendations for follow up therapy are one component of a multi-disciplinary discharge planning process, led by the attending physician.  Recommendations may be updated based on patient status, additional functional criteria and insurance authorization.    Follow Up Recommendations  Outpatient SLP (initial supervision at d/c)    Frequency and Duration min 2x/week  2 weeks      SLP Evaluation Cognition  Overall Cognitive Status: Impaired/Different from baseline Arousal/Alertness: Awake/alert Orientation Level: Oriented X4 (not to date) Memory: Impaired Memory Impairment:  (reduced immediate recall; reduced working memory for basic mental calculations) Immediate Memory Recall:  (x4 trials to code x4 words immediately; recalled 4/4 words after 10 minute delay independently) Awareness: Appears intact Problem Solving: Appears intact Executive Function: Self Correcting Self Correcting: Appears intact Safety/Judgment: Appears intact       Comprehension  Auditory Comprehension Overall Auditory Comprehension: Appears within functional limits for tasks assessed EffectiveTechniques: Extra processing time    Expression Expression Primary Mode of Expression: Verbal Verbal Expression Overall Verbal Expression: Impaired Automatic Speech:  (WFL) Level of Generative/Spontaneous Verbalization:  (mild anomia with extra time/self-correction to repeat breakdowns) Repetition: Impaired Level of Impairment: Phrase level;Sentence level (improved with  self-correction) Naming: No impairment (extra time) Pragmatics: No impairment Written Expression Dominant Hand: Left Written Expression:  (DNT)    Oral / Motor  Oral Motor/Sensory Function Overall Oral Motor/Sensory Function: Within functional limits Motor Speech Overall Motor Speech: Appears within functional limits for tasks assessed Respiration: Within functional limits Phonation: Normal Resonance: Within functional limits Articulation: Within functional limitis Intelligibility: Intelligible Motor Planning: Witnin functional limits     Clyde Canterbury, M.S., CCC-SLP Speech-Language Pathologist [facility] [phone] (ASCOM)                    Woodroe Chen 11/06/2021, 12:06 PM

## 2021-11-06 NOTE — Evaluation (Signed)
Occupational Therapy Evaluation Patient Details Name: Robert Mccann MRN: [id number] DOB: 06-20-1996 Today's Date: 11/06/2021   History of Present Illness 25 y.o. male with medical history significant for hypertension, HFrEF with cardiomyopathy, type 2 diabetes mellitus and pulmonary embolism on Eliquis, who presented to the emergency room with acute onset of dysarthria, expressive dysphasia, left arm numbness, and blurred vision. CT head revealed bilateral cerebral infarcts including a moderate-sized acute to  subacute right temporoparietal infarct. MRI brain revealed large acute posterior right MCA territory infarct. EKG showed sinus tachycardia with rate of 105 with right atrial enlargement and LVH.  Noncontrasted CT scan performed on 11/05/21 revealed bilateral cerebral infarcts including a moderate-sized acute to subacute right temporoparietal infarct with no acute intracranial hemorrhage.   Clinical Impression   Pt seen for OT evaluation this date. Prior to admission, pt was independent in all ADLs and functional mobility, living in a 2-story home with parents. Pt currently endorses peripheral vision loss in L eye and presents with decreased coordination of LUE (requires increased time/effort during finger-to-nose assessment). Strength and sensation of UE appear symmetrical and WFL. This date, pt performed seated LB dressing, toilet transfer, and standing grooming tasks MOD-I (required increased time and MIN verbal cues for environmental scanning in setting of peripheral vision loss). Pt would benefit from additional skilled OT services to address current visual and coordination deficits to maximize return to PLOF. Upon discharge, recommend no OT follow-up.      Recommendations for follow up therapy are one component of a multi-disciplinary discharge planning process, led by the attending physician.  Recommendations may be updated based on patient status, additional functional criteria and  insurance authorization.   Follow Up Recommendations  No OT follow up    Assistance Recommended at Discharge Intermittent Supervision/Assistance  Functional Status Assessment  Patient has had a recent decline in their functional status and demonstrates the ability to make significant improvements in function in a reasonable and predictable amount of time.  Equipment Recommendations  None recommended by OT       Precautions / Restrictions Precautions Precautions: Fall Restrictions Weight Bearing Restrictions: No      Mobility Bed Mobility Overal bed mobility: Independent             General bed mobility comments: not assessed, pt in recliner at beginning/end of session    Transfers Overall transfer level: Modified independent Equipment used: None                      Balance Overall balance assessment: Independent                                         ADL either performed or assessed with clinical judgement   ADL Overall ADL's : Modified independent                                       General ADL Comments: Pt able to perform seated LB dressing, toilet transfer, and standing grooming tasks MOD-I (required increased time and MIN verbal cues for environmental scanning in setting of peripheral vision loss)     Vision Patient Visual Report: Peripheral vision impairment Vision Assessment?: Vision impaired- to be further tested in functional context Additional Comments: Pt with peripheral vision loss in L eye. No impairments observed with  R eye. Pt requiring MIN verbal cues for initiating environmental scanning toward L side            Pertinent Vitals/Pain Pain Assessment: No/denies pain Faces Pain Scale: Hurts a little bit Pain Location: L UE IV site Pain Descriptors / Indicators: Burning Pain Intervention(s): Limited activity within patient's tolerance;Monitored during session     Hand Dominance Left    Extremity/Trunk Assessment Upper Extremity Assessment Upper Extremity Assessment: LUE deficits/detail LUE Deficits / Details: Unable to assess strength/ROM of L elbow d/t IV placement, however shoulder and grip strength 5/5. Sensation WFL. Pt with decreased speed and accuracy during finger-to-nose coodination assessment compared to R LUE: Unable to fully assess due to pain   Lower Extremity Assessment Lower Extremity Assessment: Overall WFL for tasks assessed   Cervical / Trunk Assessment Cervical / Trunk Assessment: Normal   Communication Communication Communication: Other (comment) (Increased word pronunciation difficulties, however speech was fluent and clear)   Cognition Arousal/Alertness: Awake/alert Behavior During Therapy: WFL for tasks assessed/performed Overall Cognitive Status: Impaired/Different from baseline                                                  Home Living Family/patient expects to be discharged to:: Private residence Living Arrangements: Parent Available Help at Discharge: Family;Friend(s);Available 24 hours/day Type of Home: House Home Access: Stairs to enter [employer] of Steps: 1 Entrance Stairs-Rails: None Home Layout: Two level Alternate Level Stairs-Number of Steps: About 12-15 steps from first to second floor Alternate Level Stairs-Rails: Left Bathroom Shower/Tub: Tub/shower unit;Walk-in shower (Parents bathroom has a walk in shower while patient's bathroom has a tub shower)     Bathroom Accessibility: Yes   Home Equipment: Rolling Walker (2 wheels) (Dan Humphreys is his mothers from when she has her stroke a couple weeks prior)      Lives With: Family    Prior Functioning/Environment Prior Level of Function : Independent/Modified Independent               ADLs Comments: Independent with ADLs at baseline        OT Problem List: Impaired vision/perception;Decreased coordination      OT  Treatment/Interventions: Self-care/ADL training;Neuromuscular education;Therapeutic activities;Visual/perceptual remediation/compensation;Patient/family education    OT Goals(Current goals can be found in the care plan section) Acute Rehab OT Goals Patient Stated Goal: to regain function of LUE OT Goal Formulation: With patient Time For Goal Achievement: 11/20/21 Potential to Achieve Goals: Good ADL Goals Pt Will Perform Grooming: Independently;standing (without verbal cues for initiating environmental scanning) Pt/caregiver will Perform Home Exercise Program: Left upper extremity;Independently;With written HEP provided (to improve LUE coordination) Additional ADL Goal #1: Pt will independently initiate x2 visual compensatory strategies during standing IADL task  OT Frequency: Min 3X/week    AM-PAC OT "6 Clicks" Daily Activity     Outcome Measure Help from another person eating meals?: None Help from another person taking care of personal grooming?: A Little Help from another person toileting, which includes using toliet, bedpan, or urinal?: None Help from another person bathing (including washing, rinsing, drying)?: None Help from another person to put on and taking off regular upper body clothing?: None Help from another person to put on and taking off regular lower body clothing?: None 6 Click Score: 23   End of Session Nurse Communication: Mobility status  Activity Tolerance: Patient tolerated treatment  well Patient left: in chair;with call bell/phone within reach  OT Visit Diagnosis: Other symptoms and signs involving the nervous system (G92.010)                Time: 0712-1975 OT Time Calculation (min): 22 min Charges:  OT General Charges $OT Visit: 1 Visit OT Evaluation $OT Eval Moderate Complexity: 1 Mod OT Treatments $Self Care/Home Management : 8-22 mins  Matthew Folks, OTR/L [facility] [phone]

## 2021-11-06 NOTE — Progress Notes (Signed)
DVT PROGRESS NOTE    Robert Mccann  HAL:[id number] DOB: Nov 15, 1996 DOA: 11/05/2021 PCP: Nira Retort    Brief Narrative:  Robert Mccann is a 25 y.o. African-American male with medical history significant for hypertension, HFrEF with cardiomyopathy, type 2 diabetes mellitus and pulmonary embolism on Eliquis, who presented to the emergency room with acute onset of dysarthria and expressive dysphasia as well as left arm numbness and mild weakness for the last 2 to 3 days with a feeling of left leg warmth without numbness or weakness.  MRI of the brain showed a large right MCA stroke.  Treated with aspirin, lipitor.   Assessment & Plan:   Active Problems:   CVA (cerebral vascular accident) (HCC)  Acute right MCA large stroke. Retention urgency. Discussed with neurology, reviewed MRI results.  Patient has a large right side MCA stroke.  Due to increased risk for hemorrhagic conversion, Eliquis will be discontinued for now. For the same reason, patient be kept in the hospital for another day for monitoring. Obtain CT angiogram of the neck and head.  Echocardiogram is also pending. Permissive hypertension, do not reduce blood pressure until above 220/120.  Hypokalemia. Continue replete.  Uncontrolled type 2 diabetes with hyperglycemia Continue current regimen.  Chronic systolic congestive heart failure Continue home medicines.  Morbid obesity.   DVT prophylaxis: Lovenox Code Status: full Family Communication:  Disposition Plan:    Status is: Inpatient  Remains inpatient appropriate because: Severity of disease.  Patient has a large stroke, high risk for hemorrhagic conversion.        I/O last 3 completed shifts: In: 208.3 [I.V.:208.3] Out: -  Total I/O In: 240 [P.O.:240] Out: -      Consultants:  Neurology.  Procedures: None  Antimicrobials: None  Subjective: Patient has some right arm tingling.  No signal headache or dizziness. No  weakness. Denies any chest pain palpitation  No abdominal pain or nausea vomiting.  Objective: Vitals:   11/06/21 0730 11/06/21 0731 11/06/21 0732 11/06/21 0951  BP:  (!) 129/94 (!) 129/94 119/83  Pulse:  95 90 96  Resp:  16 18 18   Temp:  98.1 F (36.7 C) 98.1 F (36.7 C)   TempSrc:  Oral Oral Oral  SpO2:  97% 97% 99%  Weight: (!) 138.1 kg     Height:        Intake/Output Summary (Last 24 hours) at 11/06/2021 1053 Last data filed at 11/06/2021 1014 Gross per 24 hour  Intake 448.27 ml  Output --  Net 448.27 ml   Filed Weights   11/05/21 1727 11/06/21 0730  Weight: (!) 137.7 kg (!) 138.1 kg    Examination:  General exam: Appears calm and comfortable, morbid obese. Respiratory system: Clear to auscultation. Respiratory effort normal. Cardiovascular system: S1 & S2 heard, RRR. No JVD, murmurs, rubs, gallops or clicks. No pedal edema. Gastrointestinal system: Abdomen is nondistended, soft and nontender. No organomegaly or masses felt. Normal bowel sounds heard. Central nervous system: Alert and oriented. No focal neurological deficits. Extremities: Symmetric 5 x 5 power. Skin: No rashes, lesions or ulcers Psychiatry: Judgement and insight appear normal. Mood & affect appropriate.     Data Reviewed: I have personally reviewed following labs and imaging studies  CBC: Recent Labs  Lab 11/05/21 1734 11/06/21 0417  WBC 10.0 7.6  NEUTROABS 6.0  --   HGB 16.5 16.8  HCT 48.8 48.5  MCV 86.5 87.4  PLT 329 285   Basic Metabolic Panel: Recent Labs  Lab 11/05/21 1734 11/06/21  0417  NA 133* 136  K 3.4* 3.1*  CL 92* 98  CO2 29 29  GLUCOSE 261* 214*  BUN 18 15  CREATININE 1.14 0.89  CALCIUM 9.4 10.2   GFR: Estimated Creatinine Clearance: 179.5 mL/min (by C-G formula based on SCr of 0.89 mg/dL). Liver Function Tests: Recent Labs  Lab 11/05/21 1734  AST 31  ALT 19  ALKPHOS 89  BILITOT 1.2  PROT 8.8*  ALBUMIN 4.1   No results for input(s): LIPASE, AMYLASE in  the last 168 hours. No results for input(s): AMMONIA in the last 168 hours. Coagulation Profile: Recent Labs  Lab 11/05/21 1734  INR 1.1   Cardiac Enzymes: No results for input(s): CKTOTAL, CKMB, CKMBINDEX, TROPONINI in the last 168 hours. BNP (last 3 results) No results for input(s): PROBNP in the last 8760 hours. HbA1C: No results for input(s): HGBA1C in the last 72 hours. CBG: Recent Labs  Lab 11/06/21 0043 11/06/21 0758  GLUCAP 170* 219*   Lipid Profile: Recent Labs    11/06/21 0417  CHOL 231*  HDL 51  LDLCALC 151*  TRIG 143  CHOLHDL 4.5   Thyroid Function Tests: Recent Labs    11/05/21 1734  TSH 1.934   Anemia Panel: No results for input(s): VITAMINB12, FOLATE, FERRITIN, TIBC, IRON, RETICCTPCT in the last 72 hours. Sepsis Labs: No results for input(s): PROCALCITON, LATICACIDVEN in the last 168 hours.  Recent Results (from the past 240 hour(s))  Resp Panel by RT-PCR (Flu A&B, Covid) Nasopharyngeal Swab     Status: None   Collection Time: 11/05/21  5:34 PM   Specimen: Nasopharyngeal Swab; Nasopharyngeal(NP) swabs in vial transport medium  Result Value Ref Range Status   SARS Coronavirus 2 by RT PCR NEGATIVE NEGATIVE Final    Comment: (NOTE) SARS-CoV-2 target nucleic acids are NOT DETECTED.  The SARS-CoV-2 RNA is generally detectable in upper respiratory specimens during the acute phase of infection. The lowest concentration of SARS-CoV-2 viral copies this assay can detect is 138 copies/mL. A negative result does not preclude SARS-Cov-2 infection and should not be used as the sole basis for treatment or other patient management decisions. A negative result may occur with  improper specimen collection/handling, submission of specimen other than nasopharyngeal swab, presence of viral mutation(s) within the areas targeted by this assay, and inadequate number of viral copies(<138 copies/mL). A negative result must be combined with clinical observations,  patient history, and epidemiological information. The expected result is Negative.  Fact Sheet for Patients:  BloggerCourse.com  Fact Sheet for Healthcare Providers:  SeriousBroker.it  This test is no t yet approved or cleared by the [place] FDA and  has been authorized for detection and/or diagnosis of SARS-CoV-2 by FDA under an Emergency Use Authorization (EUA). This EUA will remain  in effect (meaning this test can be used) for the duration of the COVID-19 declaration under Section 564(b)(1) of the Act, 21 U.S.C.section 360bbb-3(b)(1), unless the authorization is terminated  or revoked sooner.       Influenza A by PCR NEGATIVE NEGATIVE Final   Influenza B by PCR NEGATIVE NEGATIVE Final    Comment: (NOTE) The Xpert Xpress SARS-CoV-2/FLU/RSV plus assay is intended as an aid in the diagnosis of influenza from Nasopharyngeal swab specimens and should not be used as a sole basis for treatment. Nasal washings and aspirates are unacceptable for Xpert Xpress SARS-CoV-2/FLU/RSV testing.  Fact Sheet for Patients: BloggerCourse.com  Fact Sheet for Healthcare Providers: SeriousBroker.it  This test is not yet approved or cleared  by the [place] and has been authorized for detection and/or diagnosis of SARS-CoV-2 by FDA under an Emergency Use Authorization (EUA). This EUA will remain in effect (meaning this test can be used) for the duration of the COVID-19 declaration under Section 564(b)(1) of the Act, 21 U.S.C. section 360bbb-3(b)(1), unless the authorization is terminated or revoked.  Performed at [facility], [address], [place], [place] [address]          Radiology Studies: CT Head Wo Contrast  Result Date: 11/05/2021 CLINICAL DATA:  Mental status change, unknown cause. Stroke-like symptoms. Speech disturbance. Left-sided numbness. EXAM: CT  HEAD WITHOUT CONTRAST TECHNIQUE: Contiguous axial images were obtained from the base of the skull through the vertex without intravenous contrast. COMPARISON:  None. FINDINGS: Brain: A moderate-sized region of hypoattenuation involving cortex and white matter in the temporoparietal region is consistent with an acute to subacute infarct. Additional smaller infarcts are present in the right greater than left occipital lobes and have a generally older appearance than the temporoparietal infarct. Hypodensity in the inferior left temporal lobe is favored to reflect artifact based on sagittal reformats with edema/infarct considered less likely. No acute intracranial hemorrhage, midline shift, or extra-axial fluid collection is identified. Vascular: No hyperdense vessel. Skull: No fracture or suspicious osseous lesion. Sinuses/Orbits: Visualized paranasal sinuses and mastoid air cells are clear. Unremarkable orbits. Other: None. IMPRESSION: 1. Bilateral cerebral infarcts including a moderate-sized acute to subacute right temporoparietal infarct. 2. No acute intracranial hemorrhage. Electronically Signed   By: Sebastian Ache M.D.   On: 11/05/2021 18:00   MR BRAIN WO CONTRAST  Result Date: 11/05/2021 CLINICAL DATA:  Stroke follow-up EXAM: MRI HEAD WITHOUT CONTRAST TECHNIQUE: Multiplanar, multiecho pulse sequences of the brain and surrounding structures were obtained without intravenous contrast. COMPARISON:  None. FINDINGS: Brain: There is a large acute infarct of the posterior right MCA territory. No acute ischemia in any other vascular territory. There is moderate edema at the infarct site. No acute or chronic hemorrhage. The midline structures are normal. Vascular: Major flow voids are preserved. Skull and upper cervical spine: Normal calvarium and skull base. Visualized upper cervical spine and soft tissues are normal. Sinuses/Orbits:No paranasal sinus fluid levels or advanced mucosal thickening. No mastoid or middle  ear effusion. Normal orbits. IMPRESSION: Large acute posterior right MCA territory infarct without hemorrhage or mass effect. Electronically Signed   By: Deatra Robinson M.D.   On: 11/05/2021 22:29        Scheduled Meds:   stroke: mapping our early stages of recovery book   Does not apply Once   aspirin EC  81 mg Oral Daily   atorvastatin  80 mg Oral Daily   carvedilol  6.25 mg Oral BID WC   enoxaparin (LOVENOX) injection  40 mg Subcutaneous Q24H   insulin aspart  0-9 Units Subcutaneous TID WC   multivitamin with minerals  1 tablet Oral Daily   potassium chloride  40 mEq Oral Q4H   predniSONE  20 mg Oral BID WC   sacubitril-valsartan  1 tablet Oral BID   spironolactone  12.5 mg Oral Daily   torsemide  40 mg Oral BID   Continuous Infusions:   LOS: 1 day    Time spent: 27 minutes    Marrion Coy, MD [facility]   To contact the attending provider between 7A-7P or the covering provider during after hours 7P-7A, please log into the web site [email or web address] and access using universal [place] password for that web  site. If you do not have the password, please call the hospital operator.  11/06/2021, 10:53 AM

## 2021-11-06 NOTE — Consult Note (Signed)
NEUROLOGY CONSULTATION NOTE   Date of service: November 06, 2021 Patient Name: Robert Mccann MRN:  [id number] DOB:  Dec 10, 1996 Reason for consult: stroke Requesting physician: Dr. Sharen Hones _ _ _   _ __   _ __ _ _  __ __   _ __   __ _  History of Present Illness   This is a 25 yo gentleman with hx cardiomyopathy, heart failure with reduced EF, DM2, HTN, PE in June on eliquis  who presents with 2-3 days of dysarthria and mild LUE weakness and was found to have a R MCA ischemic stroke. He was continued on eliquis until this AM when hospitalist and I agreed to hold it in the setting of acute ischemic stroke (since PE was 4 mos out). He is currently on ASA 81mg  daily only.   Stroke workup this hospitalization:  MRI brain wo contrast Large acute posterior right MCA territory infarct without hemorrhage or mass effect.  CTA H&N 1. Redemonstrated right posterior PCA territory infarcts, without evidence of hemorrhagic conversion. 2. Diminutive right M2 and M3 branches, with diminished opacification and likely multifocal narrowing. 3. Hypoplastic left A2, which is possibly occluded distally. 4. No hemodynamically significant stenosis in the neck.  TTE  1. Left ventricular ejection fraction, by estimation, is <20%. The left  ventricle has severely decreased function. The left ventricle demonstrates  global hypokinesis. The left ventricular internal cavity size was  moderately dilated. There is mild left  ventricular hypertrophy. Left ventricular diastolic parameters are  consistent with Grade II diastolic dysfunction (pseudonormalization).   2. Right ventricular systolic function is moderately reduced. The right  ventricular size is not well visualized.   3. The mitral valve is normal in structure. No evidence of mitral valve  regurgitation.   4. The aortic valve is tricuspid. Aortic valve regurgitation is not  visualized.  IAS/Shunts: No atrial level shunt detected by color flow  Doppler.  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 05/2021 noted during periods of sleep @ time of hospitalization for PE.   Cardiomyopathy ([place])    a. 05/2021 Echo: EF <20%, glob HK. [place]:C ratio of 2.1:1 suggesting non-compaction. Gr2 DD. Sev reduced RV fxn. Nl PASP. Mildly dil RA. Mild MR.   Diabetes mellitus without complication ([place])    HFrEF (heart failure with reduced ejection fraction) ([place])    a. 05/2021 Echo: EF <20%. ? non-compaction.   Hypertension    Pulmonary embolism ([place])    a. 05/2021 CTA Chest: PE extending from lobar arteris of RUL and RLL w/ concern for pulml infarct-->s/p thrombolysis and thrombectomy of RUL/RML/RLL.   Past Surgical History:  Procedure Laterality Date   HAND SURGERY Left age 79   HAND SURGERY     PULMONARY THROMBECTOMY N/A 06/28/2021   Procedure: PULMONARY THROMBECTOMY;  Surgeon: Algernon Huxley, MD;  Location: [facility];  Service: Cardiovascular;  Laterality: N/A;  Family History  Problem Relation Age of Onset   Heart failure Mother    Cerebrovascular Accident Mother    Diabetes Father    Hypertension Father    Social History   Socioeconomic History   Marital status: Single    Spouse name: Not on file   Number of children: Not on file   Years of education: Not on file   Highest education level: Not on file  Occupational History   Not on file  Tobacco Use   Smoking status: Never   Smokeless tobacco: Never  Vaping Use   Vaping Use: Never used  Substance and Sexual Activity   Alcohol  use: Not Currently    Comment: special occasions   Drug use: Yes    Types: Marijuana    Comment: last month   Sexual activity: Not on file  Other Topics Concern   Not on file  Social History Narrative   Not on file   Social Determinants of Health   Financial Resource Strain: Not on file  Food Insecurity: Not on file  Transportation Needs: Not on file  Physical Activity: Not on file  Stress: Not on file  Social Connections: Not on file   No Known Allergies  Medications   Medications Prior to Admission  Medication Sig Dispense Refill Last Dose   apixaban (ELIQUIS) 5 MG TABS tablet Take 5 mg by mouth 2 (two) times daily.   11/05/2021   Biotin 1000 MCG tablet Take 1 tablet (1 mg total) by mouth daily. Chew 1 gummy daily. 30 tablet 3 11/05/2021   carvedilol (COREG) 6.25 MG tablet Take 1 tablet (6.25 mg total) by mouth 2 (two) times daily with a meal. 60 tablet 3 11/05/2021   FARXIGA 10 MG TABS tablet Take 10 mg by mouth daily.   11/05/2021   furosemide (LASIX) 20 MG tablet Take 20 mg by mouth daily.   11/05/2021   metFORMIN (GLUCOPHAGE) 500 MG tablet Take 1 tablet (500 mg total) by mouth 2 (two) times daily. 60 tablet 3 11/05/2021   Multiple Vitamin (ONE-A-DAY MENS PO) Take 1 tablet by mouth daily.   11/05/2021   sacubitril-valsartan (ENTRESTO) 24-26 MG Take 1 tablet by mouth 2 (two) times daily. 180 tablet 3 11/05/2021   spironolactone (ALDACTONE) 25 MG tablet Take 0.5 tablets (12.5 mg total) by mouth daily. 15 tablet 3 11/05/2021   torsemide (DEMADEX) 20 MG tablet Take 2 tablets (40 mg total) by mouth 2 (two) times daily. 60 tablet 2 11/05/2021      Current Facility-Administered Medications:     stroke: mapping our early stages of recovery book, , Does not apply, Once, Mansy, Jan A, MD   acetaminophen (TYLENOL) tablet 650 mg, 650 mg, Oral, Q6H PRN **OR** acetaminophen (TYLENOL) suppository 650 mg, 650 mg, Rectal, Q6H PRN, Mansy, Jan A, MD   aspirin EC tablet 81 mg, 81 mg, Oral, Daily,  Mansy, Jan A, MD, 81 mg at 11/06/21 0849   atorvastatin (LIPITOR) tablet 80 mg, 80 mg, Oral, Daily, Sharen Hones, MD, 80 mg at 11/06/21 1257   carvedilol (COREG) tablet 6.25 mg, 6.25 mg, Oral, BID WC, Mansy, Jan A, MD, 6.25 mg at 11/06/21 1653   enoxaparin (LOVENOX) injection 70 mg, 0.5 mg/kg, Subcutaneous, Q24H, Zhang, Dekui, MD   insulin aspart (novoLOG) injection 0-9 Units, 0-9 Units, Subcutaneous, TID WC, Rise Patience, MD, 5 Units at 11/06/21 1653   magnesium hydroxide (MILK OF MAGNESIA) suspension 30 mL, 30 mL, Oral, Daily PRN, Mansy, Jan A,  MD   multivitamin with minerals tablet 1 tablet, 1 tablet, Oral, Daily, Mansy, Jan A, MD, 1 tablet at 11/06/21 0848   ondansetron (ZOFRAN) tablet 4 mg, 4 mg, Oral, Q6H PRN **OR** ondansetron (ZOFRAN) injection 4 mg, 4 mg, Intravenous, Q6H PRN, Mansy, Jan A, MD   predniSONE (DELTASONE) tablet 20 mg, 20 mg, Oral, BID WC, Mansy, Jan A, MD, 20 mg at 11/06/21 1653   sacubitril-valsartan (ENTRESTO) 24-26 mg per tablet, 1 tablet, Oral, BID, Mansy, Jan A, MD, 1 tablet at 11/06/21 0848   spironolactone (ALDACTONE) tablet 12.5 mg, 12.5 mg, Oral, Daily, Mansy, Jan A, MD, 12.5 mg at 11/06/21 0849   torsemide (DEMADEX) tablet 40 mg, 40 mg, Oral, BID, Mansy, Jan A, MD, 40 mg at 11/06/21 0848   traZODone (DESYREL) tablet 25 mg, 25 mg, Oral, QHS PRN, Mansy, Jan A, MD  Vitals   Vitals:   11/06/21 0732 11/06/21 0951 11/06/21 1143 11/06/21 1337  BP: (!) 129/94 119/83 110/81 116/72  Pulse: 90 96 86 91  Resp: 18 18  18   Temp: 98.1 F (36.7 C)   (!) 97 F (36.1 C)  TempSrc: Oral Oral  Oral  SpO2: 97% 99% 96% 96%  Weight:      Height:         Body mass index is 41.29 kg/m.  Physical Exam   Physical Exam Gen: A&O x4, NAD HEENT: Atraumatic, normocephalic;mucous membranes moist; oropharynx clear, tongue without atrophy or fasciculations. Neck: Supple, trachea midline. Resp: CTAB, no w/r/r CV: RRR, no m/g/r; nml S1 and S2. 2+ symmetric peripheral  pulses. Abd: soft/NT/ND; nabs x 4 quad Extrem: Nml bulk; no cyanosis, clubbing, or edema.  Neuro: *MS: A&O x4. Follows multi-step commands.  *Speech: fluid, mild dysarthria, able to name and repeat *CN:    I: Deferred   II,III: PERRLA, VFF by confrontation, optic discs unable to be visualized 2/2 pupillary constriction   III,IV,VI: EOMI w/o nystagmus, no ptosis   V: Sensation intact from V1 to V3 to LT   VII: Eyelid closure was full.  Subtle L NLF flattening   VIII: Hearing intact to voice   IX,X: Voice normal, palate elevates symmetrically    XI: SCM/trap 5/5 bilat   XII: Tongue protrudes midline, no atrophy or fasciculations   *Motor:   Normal bulk.  No tremor, rigidity or bradykinesia. No pronator drift. RUE 4+/5 tricep and wrist extension, otherwise all extremities 5/5 throughout.  *Sensory: Intact to light touch, pinprick, temperature vibration throughout. Symmetric. Propioception intact bilat.  No double-simultaneous extinction.  *Coordination:  Finger-to-nose, heel-to-shin, rapid alternating motions were intact. *Reflexes:  2+ and symmetric throughout without clonus; toes down-going bilat *Gait: deferred  NIHSS = 1 facial asymmetry   Premorbid mRS = 0   Labs   CBC:  Recent Labs  Lab 11/05/21 1734 11/06/21 0417  WBC 10.0 7.6  NEUTROABS 6.0  --   HGB 16.5 16.8  HCT 48.8 48.5  MCV 86.5 87.4  PLT 329 [id number]    Basic Metabolic Panel:  Lab Results  Component Value Date   NA 136 11/06/2021   K 3.1 (L) 11/06/2021   CO2 29 11/06/2021   GLUCOSE 214 (H) 11/06/2021   BUN 15 11/06/2021   CREATININE 0.89 11/06/2021   CALCIUM 10.2 11/06/2021   GFRNONAA >60 11/06/2021   Lipid Panel:  Lab Results  Component Value Date   LDLCALC 151 (H) 11/06/2021   HgbA1c:  Lab Results  Component Value Date   HGBA1C 8.7 (H) 11/06/2021  Urine Drug Screen:     Component Value Date/Time   LABOPIA NONE DETECTED 11/05/2021 2056   COCAINSCRNUR NONE DETECTED 11/05/2021 2056    LABBENZ NONE DETECTED 11/05/2021 2056   AMPHETMU NONE DETECTED 11/05/2021 2056   THCU NONE DETECTED 11/05/2021 2056   LABBARB NONE DETECTED 11/05/2021 2056    Alcohol Level No results found for: ETH   Impression   This is a 25 yo gentleman with hx cardiomyopathy, heart failure with reduced EF, DM2, HTN, PE in June on eliquis  who presents with 2-3 days of dysarthria and mild LUE weakness and was found to have a R MCA ischemic stroke. He was continued on eliquis until this AM when hospitalist and I agreed to hold it in the setting of acute ischemic stroke (since PE was 4 mos out). He is currently on ASA 81mg  daily only. His TTE shows marked reduction in EF and global hyperkinesis which increases his risk for central embolic source. There was no intracardiac clot identified on TTE but I would recommend consulting cardiology for both his heart failure and consideration of TTE. He will likely need hypercoag workup as well considering his hx PE. There was no shunt identified on TTE but he did report his LLE was feeling warm on admission so I have ordered BLE r/o DVT.  Recommendations   - Goal normotension >2 days out from acute event, avoid hypotension - Hold eliquis until day 5 with repeat head CT prior to starting to r/o interval hemorrhagic conversion. ASA 81mg  daily until then.  - Consult cardiology for heart failure and consideration of TEE - Atorvastatin 80mg  daily - Glycemic control per primary team - BLE [place] r/o DVT - q4 hr neuro checks - STAT head CT for any change in neuro exam - Tele - PT/OT/SLP - Stroke education - Amb referral to neurology upon discharge   Will continue to follow.  ______________________________________________________________________   Thank you for the opportunity to take part in the care of this patient. If you have any further questions, please contact the neurology consultation attending.  Signed,  , MD [facility] [phone]  If 7pm- 7am, please page neurology on call as listed in AMION.

## 2021-11-06 NOTE — Progress Notes (Signed)
   11/06/21 5102  Clinical Encounter Type  Visited With Patient  Visit Type Initial  Referral From Nurse  Consult/Referral To Chaplain  Spiritual Encounters  Spiritual Needs Literature  Chaplain Feliberto Stockley did one AD education with pt. Pt does not wish to complete the document at this present time.

## 2021-11-06 NOTE — Evaluation (Signed)
Physical Therapy Evaluation Patient Details Name: Robert Mccann MRN: [id number] DOB: April 28, 1996 Today's Date: 11/06/2021  History of Present Illness  Patient is a 25 y.o. African-American male with PMH of hypertension, HFrEF with cardiomyopathy, type 2 diabetes mellitus and pulmonary embolism on Eliquis, who presented to [facility] follwing a CVA. Patient has complained on altered sensation, chest pain, and vision changes. on 11/05/21 EKG showed sinus tachycardia with rate of 105 with right atrial enlargement and LVH.  Noncontrasted CT scan performed on 11/05/21 revealed bilateral cerebral infarcts including a moderate-sized acute to subacute right temporoparietal infarct with no acute intracranial hemorrhage.  Clinical Impression  Patient tolerated session well and was agreeable to treatment. Patient was laying in bed upon arrival. Patient's ROM, MMT, and balance bilaterally WFL. Patient was able to ambulate around the nursing unit CGA with minimal cueing on obstacle negotiation and navigation to get back to his room due to being unfamiliar with hospital. Patient reported no pain throughout the entire session, however reports decreased peripheral vision in the L eye. No balance deficits noted with decreased peripheral vision. Patient is able to complete sit<>stands, and stand pivot transfers Independently. Upon standing HR increased from 97 bpm to 115 bpm, however dropped back down to 102bpm at conclusion of session. Continued skilled physical therapy is not required at this time.      Recommendations for follow up therapy are one component of a multi-disciplinary discharge planning process, led by the attending physician.  Recommendations may be updated based on patient status, additional functional criteria and insurance authorization.  Follow Up Recommendations No PT follow up    Assistance Recommended at Discharge PRN  Functional Status Assessment Patient has had a recent decline in their functional  status and demonstrates the ability to make significant improvements in function in a reasonable and predictable amount of time.  Equipment Recommendations  None recommended by PT    Recommendations for Other Services       Precautions / Restrictions Precautions Precautions: Fall Restrictions Weight Bearing Restrictions: No      Mobility  Bed Mobility Overal bed mobility: Independent                  Transfers Overall transfer level: Modified independent Equipment used: None                    Ambulation/Gait Ambulation/Gait assistance: Min guard Gait Distance (Feet): 280 Feet Assistive device: None Gait Pattern/deviations: WFL(Within Functional Limits)       General Gait Details: Patient required verbal cueing on obstacle negotiation and navigating the nurses station due to patient unfamiliar with hospital; no reports of dizziness or blurred vision reported when up and ambulating  Stairs            Wheelchair Mobility    Modified Rankin (Stroke Patients Only)       Balance Overall balance assessment: Independent                                           Pertinent Vitals/Pain Pain Assessment: No/denies pain    Home Living Family/patient expects to be discharged to:: Private residence Living Arrangements: Parent Available Help at Discharge: Family;Friend(s) Type of Home: House Home Access: Stairs to enter Entrance Stairs-Rails: None Entrance Stairs-Number of Steps: 1 Alternate Level Stairs-Number of Steps: About 12-15 steps from first to second floor Home Layout: Two level Home Equipment:  Rolling Walker (2 wheels) (Dan Humphreys is his mothers from when she has her stroke a couple weeks prior)      Prior Function Prior Level of Function : Independent/Modified Independent                     Higher education careers adviser        Extremity/Trunk Assessment   Upper Extremity Assessment Upper Extremity Assessment: Overall WFL  for tasks assessed (L elbow flexion ROM and MMT unable to assess due to IV placement)    Lower Extremity Assessment Lower Extremity Assessment: Overall WFL for tasks assessed Intact BLE sensation, strength, proprioception, tone, RAM UE coordination on R (L not tested due to IV placement), R finger to nose coordination, B heel shin coordination.         Communication   Communication: Other (comment) (Increased word pronunciation difficulties, however speech was fluent and clear)  Cognition Arousal/Alertness: Awake/alert Behavior During Therapy: WFL for tasks assessed/performed Overall Cognitive Status: Within Functional Limits for tasks assessed                                          General Comments      Exercises     Assessment/Plan    PT Assessment Patient does not need any further PT services  PT Problem List         PT Treatment Interventions      PT Goals (Current goals can be found in the Care Plan section)  Acute Rehab PT Goals Patient Stated Goal: Wants to go home PT Goal Formulation: With patient Time For Goal Achievement: 11/20/21 Potential to Achieve Goals: Good    Frequency     Barriers to discharge        Co-evaluation               AM-PAC PT "6 Clicks" Mobility  Outcome Measure Help needed turning from your back to your side while in a flat bed without using bedrails?: None Help needed moving from lying on your back to sitting on the side of a flat bed without using bedrails?: None Help needed moving to and from a bed to a chair (including a wheelchair)?: None Help needed standing up from a chair using your arms (e.g., wheelchair or bedside chair)?: None Help needed to walk in hospital room?: None Help needed climbing 3-5 steps with a railing? : None 6 Click Score: 24    End of Session Equipment Utilized During Treatment: Gait belt Activity Tolerance: Patient tolerated treatment well;No increased pain Patient left: in  chair;with call bell/phone within reach Nurse Communication: Mobility status      Time: 0902-0931 PT Time Calculation (min) (ACUTE ONLY): 29 min   Charges:   PT Evaluation $PT Eval Low Complexity: 1 Low PT Treatments $Gait Training: 8-22 mins        Angelica Ran, PT  11/06/21. 9:58 AM

## 2021-11-07 ENCOUNTER — Inpatient Hospital Stay: Payer: BC Managed Care – PPO

## 2021-11-07 DIAGNOSIS — I63511 Cerebral infarction due to unspecified occlusion or stenosis of right middle cerebral artery: Secondary | ICD-10-CM

## 2021-11-07 DIAGNOSIS — Z86711 Personal history of pulmonary embolism: Secondary | ICD-10-CM

## 2021-11-07 LAB — CBC WITH DIFFERENTIAL/PLATELET
Abs Immature Granulocytes: 0.07 10*3/uL (ref 0.00–0.07)
Basophils Absolute: 0 10*3/uL (ref 0.0–0.1)
Basophils Relative: 0 %
Eosinophils Absolute: 0 10*3/uL (ref 0.0–0.5)
Eosinophils Relative: 0 %
HCT: 49.2 % (ref 39.0–52.0)
Hemoglobin: 16.9 g/dL (ref 13.0–17.0)
Immature Granulocytes: 1 %
Lymphocytes Relative: 20 %
Lymphs Abs: 2.8 10*3/uL (ref 0.7–4.0)
MCH: 29.8 pg (ref 26.0–34.0)
MCHC: 34.3 g/dL (ref 30.0–36.0)
MCV: 86.8 fL (ref 80.0–100.0)
Monocytes Absolute: 0.9 10*3/uL (ref 0.1–1.0)
Monocytes Relative: 6 %
Neutro Abs: 10.1 10*3/uL — ABNORMAL HIGH (ref 1.7–7.7)
Neutrophils Relative %: 73 %
Platelets: 326 10*3/uL (ref 150–400)
RBC: 5.67 MIL/uL (ref 4.22–5.81)
RDW: 13.3 % (ref 11.5–15.5)
WBC: 13.9 10*3/uL — ABNORMAL HIGH (ref 4.0–10.5)
nRBC: 0 % (ref 0.0–0.2)

## 2021-11-07 LAB — SEDIMENTATION RATE: Sed Rate: 29 mm/hr — ABNORMAL HIGH (ref 0–15)

## 2021-11-07 LAB — BASIC METABOLIC PANEL
Anion gap: 12 (ref 5–15)
BUN: 17 mg/dL (ref 6–20)
CO2: 28 mmol/L (ref 22–32)
Calcium: 9.2 mg/dL (ref 8.9–10.3)
Chloride: 92 mmol/L — ABNORMAL LOW (ref 98–111)
Creatinine, Ser: 0.91 mg/dL (ref 0.61–1.24)
GFR, Estimated: >60 mL/min (ref >60)
Glucose, Bld: 209 mg/dL — ABNORMAL HIGH (ref 70–99)
Potassium: 3.3 mmol/L — ABNORMAL LOW (ref 3.5–5.1)
Sodium: 132 mmol/L — ABNORMAL LOW (ref 135–145)

## 2021-11-07 LAB — GLUCOSE, CAPILLARY
Glucose-Capillary: 213 mg/dL — ABNORMAL HIGH (ref 70–99)
Glucose-Capillary: 275 mg/dL — ABNORMAL HIGH (ref 70–99)
Glucose-Capillary: 270 mg/dL — ABNORMAL HIGH (ref 70–99)
Glucose-Capillary: 279 mg/dL — ABNORMAL HIGH (ref 70–99)

## 2021-11-07 LAB — MAGNESIUM: Magnesium: 2 mg/dL (ref 1.7–2.4)

## 2021-11-07 MED ORDER — POTASSIUM CHLORIDE 10 MEQ/100ML IV SOLN
10.0000 meq | INTRAVENOUS | Status: AC
  Administered 2021-11-07 (×2): 10 meq via INTRAVENOUS
  Filled 2021-11-07: qty 100

## 2021-11-07 MED ORDER — INSULIN GLARGINE-YFGN 100 UNIT/ML ~~LOC~~ SOLN
12.0000 [IU] | Freq: Every day | SUBCUTANEOUS | Status: DC
  Administered 2021-11-07: 23:00:00 12 [IU] via SUBCUTANEOUS
  Filled 2021-11-07 (×2): qty 0.12

## 2021-11-07 MED ORDER — SODIUM CHLORIDE 0.9 % IV SOLN: INTRAVENOUS | Status: DC

## 2021-11-07 MED ORDER — POTASSIUM CHLORIDE 20 MEQ PO PACK
40.0000 meq | PACK | Freq: Once | ORAL | Status: AC
  Administered 2021-11-07: 10:00:00 40 meq via ORAL
  Filled 2021-11-07: qty 2

## 2021-11-07 NOTE — Consult Note (Signed)
Cardiology Consultation:   Patient ID: Robert Mccann; [id number]; [birth year]   Admit date: 11/05/2021 Date of Consult: 11/07/2021  Primary Care Provider: Ellene Route Primary Cardiologist: Meta Primary Electrophysiologist:  none   Patient Profile:   Robert Mccann is a 25 y.o. 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 Quinn Axe, MD.  History of Present Illness:   Robert Mccann was admitted to the hospital in 05/2021 with shortness of breath and substernal chest pain.  CTA of the chest demonstrated pulmonary artery emboli extending from the lobar arteries of the right upper and lower lobes and throughout their segmental branches.  He subsequently underwent pulmonary thrombectomy and thrombolysis.  Lower extremity ultrasound was negative for DVT, though it was noted he had previously spent 12 hours each way in a bus to [facility] and back about a week or 2 prior to his presentation.  In the setting of his PE, echo demonstrated an EF of less than 20% with global hypokinesis, grade 2 diastolic dysfunction, and severely reduced RV systolic function.  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  During his admission he was noted to have intermittent Mobitz type II AV block during periods of sleep.  Cardiac MRI has not been able to be scheduled secondary to body circumference.  With regards to his PE, he has been maintained on apixaban.  With regards to his GDMT, he has been maintained on carvedilol, Entresto, spironolactone, and torsemide.  He has been unable to be maintained on SGLT2i secondary to financial constraints.  He was admitted to [facility] on 11/05/2021 with 2 to 3-day history of dysarthria and left  hand numbness/weakness.  He was found to have a large acute posterior right MCA ischemic stroke on MRI of the brain without hemorrhage or mass-effect.  CTA of the head and neck redemonstrated the right posterior PCA territory infarcts without evidence of hemorrhagic conversion.  2D surface echo showed a persistent cardiomyopathy with an EF less than 20%, global hypokinesis, grade 2 diastolic dysfunction, mild LVH, moderately reduced RV systolic function, and no significant valvular abnormalities.  There was no atrial level shunt detected by color-flow Doppler.  No evidence of intracardiac thrombus on surface echo.  Lower extremity ultrasound was negative for DVT bilaterally.  Initially, he was continued on Eliquis, though this was subsequently discontinued in an effort to minimize risk of hemorrhagic conversion.  He has been placed on aspirin 81 mg daily in its place.  He denied missing any doses of his Eliquis leading up to his presentation.  At time of cardiology consult, his dysarthria and left upper extremity weakness/numbness have resolved and he is back to baseline.  He is without symptoms concerning for angina or decompensation.  Maintaining sinus rhythm on telemetry.  Of note, he does report his mother having a similar past medical history as himself and is status post ICD.  He also reports his mother also recently suffered a stroke.   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 05/2021 noted during periods of sleep @ time of hospitalization for PE.   Cardiomyopathy ([place])    a. 05/2021 Echo: EF <20%, glob HK. [place]:C ratio of 2.1:1 suggesting non-compaction. Gr2 DD. Sev  reduced RV fxn. Nl PASP. Mildly dil RA. Mild MR.   Diabetes mellitus without complication (HCC)    HFrEF (heart failure with reduced ejection fraction) (HCC)    a. 05/2021 Echo: EF <20%. ? non-compaction.   Hypertension    Pulmonary embolism (HCC)    a. 05/2021 CTA Chest: PE extending from lobar arteris of RUL  and RLL w/ concern for pulml infarct-->s/p thrombolysis and thrombectomy of RUL/RML/RLL.    Past Surgical History:  Procedure Laterality Date   HAND SURGERY Left age 22   HAND SURGERY     PULMONARY THROMBECTOMY N/A 06/28/2021   Procedure: PULMONARY THROMBECTOMY;  Surgeon: Annice Needy, MD;  Location: [facility] INVASIVE CV LAB;  Service: Cardiovascular;  Laterality: N/A;     Home Meds: Prior to Admission medications   Medication Sig Start Date End Date Taking? Authorizing Provider  apixaban (ELIQUIS) 5 MG TABS tablet Take 5 mg by mouth 2 (two) times daily.   Yes [provider]  Biotin 1000 MCG tablet Take 1 tablet (1 mg total) by mouth daily. Chew 1 gummy daily. 06/30/21  Yes Enedina Finner, MD  carvedilol (COREG) 6.25 MG tablet Take 1 tablet (6.25 mg total) by mouth 2 (two) times daily with a meal. 06/30/21  Yes Enedina Finner, MD  FARXIGA 10 MG TABS tablet Take 10 mg by mouth daily. 10/13/21  Yes [provider]  furosemide (LASIX) 20 MG tablet Take 20 mg by mouth daily. 10/13/21  Yes [provider]  metFORMIN (GLUCOPHAGE) 500 MG tablet Take 1 tablet (500 mg total) by mouth 2 (two) times daily. 06/30/21  Yes Enedina Finner, MD  Multiple Vitamin (ONE-A-DAY MENS PO) Take 1 tablet by mouth daily.   Yes [provider]  sacubitril-valsartan (ENTRESTO) 24-26 MG Take 1 tablet by mouth 2 (two) times daily. 07/06/21  Yes Creig Hines, NP  spironolactone (ALDACTONE) 25 MG tablet Take 0.5 tablets (12.5 mg total) by mouth daily. 09/15/21 12/14/21 Yes Agbor-Etang, Arlys John, MD  torsemide (DEMADEX) 20 MG tablet Take 2 tablets (40 mg total) by mouth 2 (two) times daily. 09/15/21 12/14/21 Yes Debbe Odea, MD    Inpatient Medications: Scheduled Meds:   stroke: mapping our early stages of recovery book   Does not apply Once   aspirin EC  81 mg Oral Daily   atorvastatin  80 mg Oral Daily   carvedilol  6.25 mg Oral BID WC   enoxaparin (LOVENOX) injection  0.5 mg/kg  Subcutaneous Q24H   insulin aspart  0-9 Units Subcutaneous TID WC   multivitamin with minerals  1 tablet Oral Daily   predniSONE  20 mg Oral BID WC   sacubitril-valsartan  1 tablet Oral BID   spironolactone  12.5 mg Oral Daily   torsemide  40 mg Oral BID   Continuous Infusions:  potassium chloride 10 mEq (11/07/21 0946)   PRN Meds: acetaminophen **OR** acetaminophen, magnesium hydroxide, ondansetron **OR** ondansetron (ZOFRAN) IV, traZODone  Allergies:  No Known Allergies  Social History:   Social History   Socioeconomic History   Marital status: Single    Spouse name: Not on file   Number of children: Not on file   Years of education: Not on file   Highest education level: Not on file  Occupational History   Not on file  Tobacco Use   Smoking status: Never   Smokeless tobacco: Never  Vaping Use   Vaping Use: Never used  Substance and Sexual Activity   Alcohol use: Not Currently  Comment: special occasions   Drug use: Yes    Types: Marijuana    Comment: last month   Sexual activity: Not on file  Other Topics Concern   Not on file  Social History Narrative   Not on file   Social Determinants of Health   Financial Resource Strain: Not on file  Food Insecurity: Not on file  Transportation Needs: Not on file  Physical Activity: Not on file  Stress: Not on file  Social Connections: Not on file  Intimate Partner Violence: Not on file     Family History:   Family History  Problem Relation Age of Onset   Heart failure Mother    Cerebrovascular Accident Mother    Diabetes Father    Hypertension Father     ROS:  Review of Systems  Constitutional:  Negative for chills, diaphoresis, fever, malaise/fatigue and weight loss.  HENT:  Negative for congestion.   Eyes:  Negative for discharge and redness.  Respiratory:  Negative for cough, sputum production, shortness of breath and wheezing.   Cardiovascular:  Negative for chest pain, palpitations, orthopnea,  claudication, leg swelling and PND.  Gastrointestinal:  Negative for abdominal pain, blood in stool, heartburn, melena, nausea and vomiting.  Musculoskeletal:  Negative for falls and myalgias.  Skin:  Negative for rash.  Neurological:  Positive for sensory change, speech change, focal weakness and weakness. Negative for dizziness, tingling, tremors and loss of consciousness.  Endo/Heme/Allergies:  Does not bruise/bleed easily.  Psychiatric/Behavioral:  Negative for substance abuse. The patient is not nervous/anxious.   All other systems reviewed and are negative.    Physical Exam/Data:   Vitals:   11/06/21 2016 11/06/21 2354 11/07/21 0551 11/07/21 0753  BP: 117/84 105/75 95/70 110/80  Pulse: 93 85 80 81  Resp: 20 16 18 18   Temp: (!) 97.5 F (36.4 C) 97.8 F (36.6 C) (!) 97.4 F (36.3 C) 98.8 F (37.1 C)  TempSrc: Oral Oral Oral   SpO2: 97% 98% 100% 100%  Weight:      Height:        Intake/Output Summary (Last 24 hours) at 11/07/2021 1006 Last data filed at 11/06/2021 1406 Gross per 24 hour  Intake 360 ml  Output --  Net 360 ml   Filed Weights   11/05/21 1727 11/06/21 0730  Weight: (!) 137.7 kg (!) 138.1 kg   Body mass index is 41.29 kg/m.   Physical Exam: General: Well developed, well nourished, in no acute distress. Head: Normocephalic, atraumatic, sclera non-icteric, no xanthomas, nares without discharge.  Neck: Negative for carotid bruits. JVD not elevated. Lungs: Clear bilaterally to auscultation without wheezes, rales, or rhonchi. Breathing is unlabored. Heart: RRR with S1 S2. No murmurs, rubs, or gallops appreciated. Abdomen: Soft, non-tender, non-distended with normoactive bowel sounds. No hepatomegaly. No rebound/guarding. No obvious abdominal masses. Msk:  Strength and tone appear normal for age. Extremities: No clubbing or cyanosis. No edema. Distal pedal pulses are 2+ and equal bilaterally. Neuro: Alert and oriented X 3. No facial asymmetry. No focal  deficit. Moves all extremities spontaneously. Psych:  Responds to questions appropriately with a normal affect.   EKG:  The EKG was personally reviewed and demonstrates: Sinus tachycardia, 105 bpm, LVH with early repolarization abnormality Telemetry:  Telemetry was personally reviewed and demonstrates: SR  Weights: Filed Weights   11/05/21 1727 11/06/21 0730  Weight: (!) 137.7 kg (!) 138.1 kg    Relevant CV Studies:  2D echo 06/28/2021:  1. There is evidence of LV  non-compaction (image 34, 30), [place]:C ratio of  2.1:1, suggesting non-compaction cardiomyopathy as possible etiology.  Recommend CMR for better analysis.. Left ventricular ejection fraction, by estimation, is <20%. The left ventricle has severely decreased function. The left ventricle demonstrates global hypokinesis. The left ventricular internal cavity size was severely dilated. Left ventricular diastolic parameters are consistent with Grade II diastolic dysfunction (pseudonormalization).   2. Right ventricular systolic function is severely reduced. The right  ventricular size is not well visualized. There is normal pulmonary artery  systolic pressure.   3. Right atrial size was mildly dilated.   4. The mitral valve is normal in structure. Mild mitral valve  regurgitation.   5. The aortic valve is normal in structure. Aortic valve regurgitation is  not visualized.   6. The inferior vena cava is dilated in size with <50% respiratory  variability, suggesting right atrial pressure of 15 mmHg.  __________  2D echo 11/06/2021: 1. Left ventricular ejection fraction, by estimation, is <20%. The left  ventricle has severely decreased function. The left ventricle demonstrates  global hypokinesis. The left ventricular internal cavity size was  moderately dilated. There is mild left  ventricular hypertrophy. Left ventricular diastolic parameters are  consistent with Grade II diastolic dysfunction (pseudonormalization).   2. Right  ventricular systolic function is moderately reduced. The right  ventricular size is not well visualized.   3. The mitral valve is normal in structure. No evidence of mitral valve  regurgitation.   4. The aortic valve is tricuspid. Aortic valve regurgitation is not  visualized.    Laboratory Data:  Chemistry Recent Labs  Lab 11/05/21 1734 11/06/21 0417 11/07/21 0623  NA 133* 136 132*  K 3.4* 3.1* 3.3*  CL 92* 98 92*  CO2 29 29 28   GLUCOSE 261* 214* 209*  BUN 18 15 17   CREATININE 1.14 0.89 0.91  CALCIUM 9.4 10.2 9.2  GFRNONAA >60 >60 >60  ANIONGAP 12 9 12     Recent Labs  Lab 11/05/21 1734  PROT 8.8*  ALBUMIN 4.1  AST 31  ALT 19  ALKPHOS 89  BILITOT 1.2   Hematology Recent Labs  Lab 11/05/21 1734 11/06/21 0417 11/07/21 0623  WBC 10.0 7.6 13.9*  RBC 5.64 5.55 5.67  HGB 16.5 16.8 16.9  HCT 48.8 48.5 49.2  MCV 86.5 87.4 86.8  MCH 29.3 30.3 29.8  MCHC 33.8 34.6 34.3  RDW 13.3 13.4 13.3  PLT 329 285 326   Cardiac EnzymesNo results for input(s): TROPONINI in the last 168 hours. No results for input(s): TROPIPOC in the last 168 hours.  BNPNo results for input(s): BNP, PROBNP in the last 168 hours.  DDimer No results for input(s): DDIMER in the last 168 hours.  Radiology/Studies:  CT ANGIO HEAD NECK W WO CM  Result Date: 11/06/2021 IMPRESSION: 1. Redemonstrated right posterior PCA territory infarcts, without evidence of hemorrhagic conversion. 2. Diminutive right M2 and M3 branches, with diminished opacification and likely multifocal narrowing. 3. Hypoplastic left A2, which is possibly occluded distally. 4. No hemodynamically significant stenosis in the neck. Electronically Signed   By: Merilyn Baba M.D.   On: 11/06/2021 14:38   CT Head Wo Contrast  Result Date: 11/05/2021 IMPRESSION: 1. Bilateral cerebral infarcts including a moderate-sized acute to subacute right temporoparietal infarct. 2. No acute intracranial hemorrhage. Electronically Signed   By: Logan Bores M.D.   On: 11/05/2021 18:00   MR BRAIN WO CONTRAST  Result Date: 11/05/2021 IMPRESSION: Large acute posterior right MCA territory infarct without  hemorrhage or mass effect. Electronically Signed   By: Deatra Robinson M.D.   On: 11/05/2021 22:29   US Venous Img Lower Bilateral (DVT)  Result Date: 11/07/2021 IMPRESSION: No evidence of deep venous thrombosis in either lower extremity. Electronically Signed   By: Irish Lack M.D.   On: 11/07/2021 09:12   Assessment and Plan:   1.  Embolic CVA: -Cannot exclude failure of Eliquis at this time -TEE scheduled for 11/9 -Anticoagulation currently held, for total of 5 days, to minimize risk of hemorrhagic conversion with recommendation to repeat head CT prior to reinitiating therapy to rule out interval hemorrhagic conversion -Currently on ASA 81 mg daily and atorvastatin 80 mg -Bilateral lower extremity ultrasound negative for DVT -Recommend bubble study be performed at time of TEE -Monitor on telemetry with recommendation for outpatient cardiac monitoring at discharge given increased risk of A. fib associated with LV noncompaction -[facility] has been requested to perform a transesophageal echocardiogram on Robert Mccann for CVA.  After careful review of history and examination, the risks and benefits of transesophageal echocardiogram have been explained including risks of esophageal damage, perforation (1:10,000 risk), bleeding, pharyngeal hematoma as well as other potential complications associated with conscious sedation including aspiration, arrhythmia, respiratory failure and death. Alternatives to treatment were discussed, questions were answered. Patient is willing to proceed  2.  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: -He appears euvolemic and well compensated -Continue current GDMT including carvedilol, Entresto, spironolactone, and torsemide -Prior to discharge, consider addition of SGLT2i, if no longer limited by  financial constraints -Repeat surface echo this admission shows persistent cardiomyopathy with an EF less than 20% -Consider cardiac MRI in the outpatient setting, if body circumference no longer precludes this imaging study given weight loss, along with referral to genetic counselor and advanced heart failure in [place], along with referral to EP for consideration of ICD -Uncertain if he would be a candidate for LVAD given history of stroke, though with continued weight loss, he may be a transplant candidate  3.  History of unprovoked PE: -Status post thrombectomy in 05/2021 -Previously on Eliquis, and denies missing any doses -When cleared by neurology, consider initiation of Coumadin in place of Eliquis given inability to exclude failure of DOAC  4.  Morbid obesity: -Continued weight loss is encouraged to heart healthy diet and regular exercise   For questions or updates, please contact [facility] Please consult [email or web address] for contact info under Cardiology/STEMI.   Signed, Eula Listen, PA-C [facility] Pager: [phone] 11/07/2021, 10:06 AM

## 2021-11-07 NOTE — Progress Notes (Signed)
Occupational Therapy Treatment Patient Details Name: Robert Mccann MRN: [id number] DOB: January 23, 1996 Today's Date: 11/07/2021   History of present illness 25 y.o. male with medical history significant for hypertension, HFrEF with cardiomyopathy, type 2 diabetes mellitus and pulmonary embolism on Eliquis, who presented to the emergency room with acute onset of dysarthria, expressive dysphasia, left arm numbness, and blurred vision. CT head revealed bilateral cerebral infarcts including a moderate-sized acute to  subacute right temporoparietal infarct. MRI brain revealed large acute posterior right MCA territory infarct. EKG showed sinus tachycardia with rate of 105 with right atrial enlargement and LVH.  Noncontrasted CT scan performed on 11/05/21 revealed bilateral cerebral infarcts including a moderate-sized acute to subacute right temporoparietal infarct with no acute intracranial hemorrhage.   OT comments  Pt seen for OT treatment on this date. Upon arrival to room, pt awake and seated upright at EOB. Pt reporting no pain and agreeable to OT tx. Pt continues to be able to perform seated UB/LB dressing and functional mobility MOD-I. This date, pt walked 375ft without AD, reporting 4/10 on rate of perceived exertion (RPE) scale. While standing, pt engaged in three visual scanning/attention tasks (e.g., clock drawing test, trail making test, and letter scanning). During visual scanning/attention tasks, pt demonstrated decreased visual spatial function, decreased visual attention, and difficulty with task switching (see exercise section for more information). At end of session, pt provided with four worksheets (two mazes and two word search puzzles) for continued opportunity for pt to independently work on visual spatial function and visual scanning tasks. Pt is making good progress toward goals and continues to benefit from skilled OT services to maximize return to PLOF and minimize risk of future falls,  injury, caregiver burden, and readmission. Will continue to follow POC. Discharge recommendation remains appropriate.     Recommendations for follow up therapy are one component of a multi-disciplinary discharge planning process, led by the attending physician.  Recommendations may be updated based on patient status, additional functional criteria and insurance authorization.    Follow Up Recommendations  No OT follow up    Assistance Recommended at Discharge Intermittent Supervision/Assistance  Equipment Recommendations  None recommended by OT       Precautions / Restrictions Precautions Precautions: Fall Restrictions Weight Bearing Restrictions: No       Mobility Bed Mobility Overal bed mobility: Independent                  Transfers Overall transfer level: Modified independent Equipment used: None                     Balance Overall balance assessment: Independent                                         ADL either performed or assessed with clinical judgement   ADL Overall ADL's : Modified independent                                       General ADL Comments: Pt performed seated UB/LB dressing and functional mobility of household distances (322ft) MOD-I      Cognition Arousal/Alertness: Awake/alert Behavior During Therapy: WFL for tasks assessed/performed Overall Cognitive Status: Within Functional Limits for tasks assessed  Exercises Other Exercises Other Exercises: Pt engaged in visual scanning activity, which required pt to scan, recognize, and circle all the G's on a worksheet with different letters. Pt correctly identified 100% of G's Other Exercises: Pt engaged in clock drawing test. Pt demonstrated difficulty correctly placing hour numbers in left upper quadrant of clock and remembering time instructed to him. Other Exercises: Pt engaged in [employer] part A (TMT-A). Pt demonstrated difficulty connecting circles without lifting pen and required 89 sec to connect all circles in ascending pattern. An average score for TMT-A is 29 seconds and a deficient score is greater than 78 seconds. Pt's difficulties with this assessment reveal that pt has difficulties with visual attention and task switching. Other Exercises: At end of session, pt provided with four worksheets (two mazes and two word search puzzles) in order for pt to work on visual scanning and visual spatial function independently           Pertinent Vitals/ Pain       Pain Assessment: No/denies pain         Frequency  Min 3X/week        Progress Toward Goals  OT Goals(current goals can now be found in the care plan section)  Progress towards OT goals: Progressing toward goals  Acute Rehab OT Goals Patient Stated Goal: to regain function of LUE OT Goal Formulation: With patient Time For Goal Achievement: 11/20/21 Potential to Achieve Goals: Good  Plan Discharge plan remains appropriate;Frequency remains appropriate       AM-PAC OT "6 Clicks" Daily Activity     Outcome Measure   Help from another person eating meals?: None Help from another person taking care of personal grooming?: A Little Help from another person toileting, which includes using toliet, bedpan, or urinal?: None Help from another person bathing (including washing, rinsing, drying)?: None Help from another person to put on and taking off regular upper body clothing?: None Help from another person to put on and taking off regular lower body clothing?: None 6 Click Score: 23    End of Session    OT Visit Diagnosis: Other symptoms and signs involving the nervous system (R29.898)   Activity Tolerance Patient tolerated treatment well   Patient Left in bed;with call bell/phone within reach   Nurse Communication Mobility status        Time: 9024-0973 OT Time Calculation (min): 30  min  Charges: OT General Charges $OT Visit: 1 Visit OT Treatments $Self Care/Home Management : 8-22 mins $Therapeutic Activity: 8-22 mins  Matthew Folks, OTR/L [facility] [phone]

## 2021-11-07 NOTE — Progress Notes (Signed)
PROGRESS NOTE    Esequiel Schauer  [id number] DOB: September 12, 1996 DOA: 11/05/2021 PCP: Ellene Route    Brief Narrative:   Stepehn Mcmanigal is a 25 y.o. African-American male with medical history significant for hypertension, HFrEF with cardiomyopathy, type 2 diabetes mellitus and pulmonary embolism on Eliquis, who presented to the emergency room with acute onset of dysarthria and expressive dysphasia as well as left arm numbness and mild weakness for the last 2 to 3 days with a feeling of left leg warmth without numbness or weakness.  MRI of the brain showed a large right MCA stroke.  Treated with aspirin, lipitor.  Eliquis need to be held for 5 days poststroke.  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 Hypertension urgency CT angiogram of the neck and head did not show significant occlusion. TTE showed ejection fraction less than 20%, no identified left ventricular thrombus. Due to large stroke with high risk for hemorrhagic conversion, Eliquis was on hold, will need to continue to be held for 5 days post stroke.  Recheck CT scan before starting. Continue aspirin and Lipitor. TEE is scheduled for tomorrow. Bilateral lower extremity duplex ultrasound did not show any DVT. Due to morbid obesity, patient could have Eliquis failure.  Chronic systolic congestive heart failure with ejection fraction less than 20%. Followed by cardiology, no exacerbation.  Morbid obesity.  Hypokalemia. Hyponatremia Will give oral and IV potassium.  Recheck level tomorrow.  Uncontrolled type 2 diabetes with hyperglycemia. Hemoglobin A1c 8.7, glucose still running high, added insulin  glargine.   DVT prophylaxis: Lovenox Code Status: full Family Communication: father and mother updated. Disposition Plan:      Status is: Inpatient   Remains inpatient appropriate because: Severity of disease.  Patient has a large stroke, high risk for hemorrhagic conversion.         I/O last 3 completed shifts: In: 568.3 [P.O.:360; I.V.:208.3] Out: -  Total I/O In: 480 [P.O.:480] Out: -    Consultants:  Neurology.  Cardiology.   Procedures: None   Antimicrobials: None     Subjective: Patient feels well today, no significant weakness today. Denies any short of breath or cough.  No paroxysmal nocturnal dyspnea orthopnea.  No leg edema. No chest pain or palpitation. No abdominal pain or nausea vomiting. No dysuria hematuria.  Objective: Vitals:   11/06/21 2354 11/07/21 0551 11/07/21 0753 11/07/21 1155  BP: 105/75 95/70 110/80 105/76  Pulse: 85 80 81 86  Resp: 16 18 18    Temp: 97.8 F (36.6 C) (!) 97.4 F (36.3 C) 98.8 F (37.1 C)   TempSrc: Oral Oral    SpO2: 98% 100% 100%   Weight:      Height:        Intake/Output Summary (Last 24 hours) at 11/07/2021 1421 Last data filed at 11/07/2021 1009 Gross per 24 hour  Intake 480 ml  Output --  Net 480 ml   Filed Weights   11/05/21 1727 11/06/21 0730  Weight: (!) 137.7 kg (!) 138.1 kg    Examination:  General exam: Appears calm and comfortable, morbid obesity. Respiratory system: Clear to auscultation. Respiratory effort normal. Cardiovascular system: S1 & S2 heard, RRR. No JVD, murmurs, rubs, gallops or  clicks. No pedal edema. Gastrointestinal system: Abdomen is nondistended, soft and nontender. No organomegaly or masses felt. Normal bowel sounds heard. Central nervous system: Alert and oriented. No focal neurological deficits. Extremities: Symmetric 5 x 5 power. Skin: No rashes, lesions or ulcers Psychiatry: Judgement and insight appear normal. Mood & affect appropriate.     Data Reviewed: I  have personally reviewed following labs and imaging studies  CBC: Recent Labs  Lab 11/05/21 1734 11/06/21 0417 11/07/21 0623  WBC 10.0 7.6 13.9*  NEUTROABS 6.0  --  10.1*  HGB 16.5 16.8 16.9  HCT 48.8 48.5 49.2  MCV 86.5 87.4 86.8  PLT 329 285 [id number]   Basic Metabolic Panel: Recent Labs  Lab 11/05/21 1734 11/06/21 0417 11/07/21 0623  NA 133* 136 132*  K 3.4* 3.1* 3.3*  CL 92* 98 92*  CO2 29 29 28   GLUCOSE 261* 214* 209*  BUN 18 15 17   CREATININE 1.14 0.89 0.91  CALCIUM 9.4 10.2 9.2  MG  --   --  2.0   GFR: Estimated Creatinine Clearance: 175.6 mL/min (by C-G formula based on SCr of 0.91 mg/dL). Liver Function Tests: Recent Labs  Lab 11/05/21 1734  AST 31  ALT 19  ALKPHOS 89  BILITOT 1.2  PROT 8.8*  ALBUMIN 4.1   No results for input(s): LIPASE, AMYLASE in the last 168 hours. No results for input(s): AMMONIA in the last 168 hours. Coagulation Profile: Recent Labs  Lab 11/05/21 1734  INR 1.1   Cardiac Enzymes: No results for input(s): CKTOTAL, CKMB, CKMBINDEX, TROPONINI in the last 168 hours. BNP (last 3 results) No results for input(s): PROBNP in the last 8760 hours. HbA1C: Recent Labs    11/06/21 0417  HGBA1C 8.7*   CBG: Recent Labs  Lab 11/06/21 1124 11/06/21 1617 11/06/21 2049 11/07/21 0755 11/07/21 1156  GLUCAP 249* 254* 326* 213* 275*   Lipid Profile: Recent Labs    11/06/21 0417  CHOL 231*  HDL 51  LDLCALC 151*  TRIG 143  CHOLHDL 4.5   Thyroid Function Tests: Recent Labs    11/05/21 1734  TSH 1.934   Anemia Panel: No results for input(s): VITAMINB12, FOLATE, FERRITIN, TIBC, IRON, RETICCTPCT in the last 72 hours. Sepsis Labs: No results for input(s): PROCALCITON, LATICACIDVEN in the last 168 hours.  Recent Results (from the past 240 hour(s))  Resp Panel by RT-PCR (Flu A&B, Covid) Nasopharyngeal Swab     Status: None   Collection Time: 11/05/21  5:34 PM   Specimen: Nasopharyngeal Swab; Nasopharyngeal(NP) swabs in vial  transport medium  Result Value Ref Range Status   SARS Coronavirus 2 by RT PCR NEGATIVE NEGATIVE Final    Comment: (NOTE) SARS-CoV-2 target nucleic acids are NOT DETECTED.  The SARS-CoV-2 RNA is generally detectable in upper respiratory specimens during the acute phase of infection. The lowest concentration of SARS-CoV-2 viral copies this assay can detect is 138 copies/mL. A negative result does not preclude SARS-Cov-2 infection and should not be used as the sole basis for treatment or other patient management decisions. A negative result may occur with  improper specimen collection/handling, submission of specimen other than nasopharyngeal swab, presence of viral mutation(s) within the areas targeted by this assay, and inadequate number of viral copies(<138 copies/mL). A negative result must be combined with clinical observations, patient history, and epidemiological information. The expected result is Negative.  Fact Sheet for Patients:  EntrepreneurPulse.com.au  Fact Sheet for Healthcare Providers:  IncredibleEmployment.be  This test is no t yet approved or  cleared by the [place] and  has been authorized for detection and/or diagnosis of SARS-CoV-2 by FDA under an Emergency Use Authorization (EUA). This EUA will remain  in effect (meaning this test can be used) for the duration of the COVID-19 declaration under Section 564(b)(1) of the Act, 21 U.S.C.section 360bbb-3(b)(1), unless the authorization is terminated  or revoked sooner.       Influenza A by PCR NEGATIVE NEGATIVE Final   Influenza B by PCR NEGATIVE NEGATIVE Final    Comment: (NOTE) The Xpert Xpress SARS-CoV-2/FLU/RSV plus assay is intended as an aid in the diagnosis of influenza from Nasopharyngeal swab specimens and should not be used as a sole basis for treatment. Nasal washings and aspirates are unacceptable for Xpert Xpress SARS-CoV-2/FLU/RSV testing.  Fact  Sheet for Patients: EntrepreneurPulse.com.au  Fact Sheet for Healthcare Providers: IncredibleEmployment.be  This test is not yet approved or cleared by the [place] FDA and has been authorized for detection and/or diagnosis of SARS-CoV-2 by FDA under an Emergency Use Authorization (EUA). This EUA will remain in effect (meaning this test can be used) for the duration of the COVID-19 declaration under Section 564(b)(1) of the Act, 21 U.S.C. section 360bbb-3(b)(1), unless the authorization is terminated or revoked.  Performed at [facility], [address] [place], [place] [address]          Radiology Studies: CT ANGIO HEAD NECK W WO CM  Result Date: 11/06/2021 CLINICAL DATA:  Neuro deficit, acute, stroke suspected EXAM: CT ANGIOGRAPHY HEAD AND NECK TECHNIQUE: Multidetector CT imaging of the head and neck was performed using the standard protocol during bolus administration of intravenous contrast. Multiplanar CT image reconstructions and MIPs were obtained to evaluate the vascular anatomy. Carotid stenosis measurements (when applicable) are obtained utilizing NASCET criteria, using the distal internal carotid diameter as the denominator. CONTRAST:  70mL OMNIPAQUE IOHEXOL 350 MG/ML SOLN COMPARISON:  CT head 11/05/2021 and MRI head 11/05/2021 FINDINGS: CT HEAD FINDINGS Brain: Redemonstrated moderate size region of hypoattenuation involving the cortex and white matter of the right temporoparietal region, extending into the anterior right temporal lobe and right occipital lobes, consistent with the acute infarct seen on the 11/05/2021 CT and MRI. Additional hypodensity in the left medial parietal lobe, also unchanged. No definite acute hemorrhage. No mass, mass effect, or midline shift. No hydrocephalus or extra-axial collection. Vascular: See CTA findings below. Skull: Normal. Negative for fracture or focal lesion. Sinuses: Imaged portions are  clear. Orbits: No acute finding. Review of the MIP images confirms the above findings CTA NECK FINDINGS Aortic arch: 2 vessel arch with a common origin of the brachiocephalic and left common carotid arteries. Imaged portion shows no evidence of aneurysm or dissection. No significant stenosis of the major arch vessel origins. Right carotid system: No evidence of dissection, stenosis (50% or greater) or occlusion. Left carotid system: No evidence of dissection, stenosis (50% or greater) or occlusion. Vertebral arteries: Left dominant, with diminutive right vertebral artery. No evidence of dissection, stenosis (50% or greater) or occlusion. Skeleton: No acute osseous abnormality. Other neck: Negative. Upper chest: Negative. Review of the MIP images confirms the above findings CTA HEAD FINDINGS Anterior circulation: Both internal carotid arteries are patent to the termini, with mild narrowing of the left supraclinoid ICA (series 10, image 256). A1 segments patent. Anterior communicating artery is not definitively visualized. The left A2 is hypoplastic and is possibly occluded distally (series 10, images 268, 272, 274, 278). The left A2 and distal ACA segments are  patent and well perfused. No M1 stenosis or occlusion. Normal MCA bifurcations. Right MCA branches demonstrate multifocal narrowing and somewhat diminished opacification (series 10, image is 275), possibly with collateralization, although there appears to be perfusion distally. Left distal MCA branches appear perfused. Posterior circulation: Left dominant system, with a diminutive right vertebral artery. Vertebral arteries patent to the vertebrobasilar junction without stenosis. Posterior inferior cerebral arteries patent bilaterally. Basilar patent to its distal aspect. Superior cerebral arteries patent bilaterally. PCAs well perfused to their distal aspects without stenosis. Diminutive posterior communicating arteries visualized bilaterally. Venous sinuses:  As permitted by contrast timing, patent. Anatomic variants: None significant Review of the MIP images confirms the above findings IMPRESSION: 1. Redemonstrated right posterior PCA territory infarcts, without evidence of hemorrhagic conversion. 2. Diminutive right M2 and M3 branches, with diminished opacification and likely multifocal narrowing. 3. Hypoplastic left A2, which is possibly occluded distally. 4. No hemodynamically significant stenosis in the neck. Electronically Signed   By: Merilyn Baba M.D.   On: 11/06/2021 14:38   CT Head Wo Contrast  Result Date: 11/05/2021 CLINICAL DATA:  Mental status change, unknown cause. Stroke-like symptoms. Speech disturbance. Left-sided numbness. EXAM: CT HEAD WITHOUT CONTRAST TECHNIQUE: Contiguous axial images were obtained from the base of the skull through the vertex without intravenous contrast. COMPARISON:  None. FINDINGS: Brain: A moderate-sized region of hypoattenuation involving cortex and white matter in the temporoparietal region is consistent with an acute to subacute infarct. Additional smaller infarcts are present in the right greater than left occipital lobes and have a generally older appearance than the temporoparietal infarct. Hypodensity in the inferior left temporal lobe is favored to reflect artifact based on sagittal reformats with edema/infarct considered less likely. No acute intracranial hemorrhage, midline shift, or extra-axial fluid collection is identified. Vascular: No hyperdense vessel. Skull: No fracture or suspicious osseous lesion. Sinuses/Orbits: Visualized paranasal sinuses and mastoid air cells are clear. Unremarkable orbits. Other: None. IMPRESSION: 1. Bilateral cerebral infarcts including a moderate-sized acute to subacute right temporoparietal infarct. 2. No acute intracranial hemorrhage. Electronically Signed   By: Logan Bores M.D.   On: 11/05/2021 18:00   MR BRAIN WO CONTRAST  Result Date: 11/05/2021 CLINICAL DATA:  Stroke  follow-up EXAM: MRI HEAD WITHOUT CONTRAST TECHNIQUE: Multiplanar, multiecho pulse sequences of the brain and surrounding structures were obtained without intravenous contrast. COMPARISON:  None. FINDINGS: Brain: There is a large acute infarct of the posterior right MCA territory. No acute ischemia in any other vascular territory. There is moderate edema at the infarct site. No acute or chronic hemorrhage. The midline structures are normal. Vascular: Major flow voids are preserved. Skull and upper cervical spine: Normal calvarium and skull base. Visualized upper cervical spine and soft tissues are normal. Sinuses/Orbits:No paranasal sinus fluid levels or advanced mucosal thickening. No mastoid or middle ear effusion. Normal orbits. IMPRESSION: Large acute posterior right MCA territory infarct without hemorrhage or mass effect. Electronically Signed   By: Ulyses Jarred M.D.   On: 11/05/2021 22:29   US Venous Img Lower Bilateral (DVT)  Result Date: 11/07/2021 CLINICAL DATA:  Lower extremity edema, stroke and history of prior pulmonary embolism. EXAM: BILATERAL LOWER EXTREMITY VENOUS DOPPLER ULTRASOUND TECHNIQUE: Gray-scale sonography with graded compression, as well as color Doppler and duplex ultrasound were performed to evaluate the lower extremity deep venous systems from the level of the common femoral vein and including the common femoral, femoral, profunda femoral, popliteal and calf veins including the posterior tibial, peroneal and gastrocnemius veins when visible. The superficial  great saphenous vein was also interrogated. Spectral Doppler was utilized to evaluate flow at rest and with distal augmentation maneuvers in the common femoral, femoral and popliteal veins. COMPARISON:  06/29/2021 FINDINGS: RIGHT LOWER EXTREMITY Common Femoral Vein: No evidence of thrombus. Normal compressibility, respiratory phasicity and response to augmentation. Saphenofemoral Junction: No evidence of thrombus. Normal  compressibility and flow on color Doppler imaging. Profunda Femoral Vein: No evidence of thrombus. Normal compressibility and flow on color Doppler imaging. Femoral Vein: No evidence of thrombus. Normal compressibility, respiratory phasicity and response to augmentation. Popliteal Vein: No evidence of thrombus. Normal compressibility, respiratory phasicity and response to augmentation. Calf Veins: No evidence of thrombus. Normal compressibility and flow on color Doppler imaging. Superficial Great Saphenous Vein: No evidence of thrombus. Normal compressibility. Venous Reflux:  None. Other Findings: No evidence of superficial thrombophlebitis or abnormal fluid collection. LEFT LOWER EXTREMITY Common Femoral Vein: No evidence of thrombus. Normal compressibility, respiratory phasicity and response to augmentation. Saphenofemoral Junction: No evidence of thrombus. Normal compressibility and flow on color Doppler imaging. Profunda Femoral Vein: No evidence of thrombus. Normal compressibility and flow on color Doppler imaging. Femoral Vein: No evidence of thrombus. Normal compressibility, respiratory phasicity and response to augmentation. Popliteal Vein: No evidence of thrombus. Normal compressibility, respiratory phasicity and response to augmentation. Calf Veins: No evidence of thrombus. Normal compressibility and flow on color Doppler imaging. Superficial Great Saphenous Vein: No evidence of thrombus. Normal compressibility. Venous Reflux:  None. Other Findings: No evidence of superficial thrombophlebitis or abnormal fluid collection. IMPRESSION: No evidence of deep venous thrombosis in either lower extremity. Electronically Signed   By: Aletta Edouard M.D.   On: 11/07/2021 09:12   ECHOCARDIOGRAM COMPLETE  Result Date: 11/06/2021    ECHOCARDIOGRAM REPORT   Patient Name:   CLETO COOMBE Date of Exam: 11/06/2021 Medical Rec #:  [id number]        Height:       72.0 in Accession #:    [id number]       Weight:        304.5 lb Date of Birth:  Jun 06, 1996       BSA:          2.547 m Patient Age:    25 years         BP:           104/66 mmHg Patient Gender: M                HR:           79 bpm. Exam Location:  [facility] Procedure: 2D Echo, Cardiac Doppler and Color Doppler Indications:     Stroke I63.9  History:         Patient has prior history of Echocardiogram examinations, most                  recent 06/28/2021. Cardiomyopathy; Risk Factors:Hypertension.                  Pulmonary embolism.  Sonographer:     Sherrie Sport Referring Phys:  [id number] JAN A MANSY Diagnosing Phys: Kate Sable MD  Sonographer Comments: Suboptimal apical window. IMPRESSIONS  1. Left ventricular ejection fraction, by estimation, is <20%. The left ventricle has severely decreased function. The left ventricle demonstrates global hypokinesis. The left ventricular internal cavity size was moderately dilated. There is mild left ventricular hypertrophy. Left ventricular diastolic parameters are consistent with Grade II diastolic dysfunction (pseudonormalization).  2. Right ventricular systolic function is moderately reduced.  The right ventricular size is not well visualized.  3. The mitral valve is normal in structure. No evidence of mitral valve regurgitation.  4. The aortic valve is tricuspid. Aortic valve regurgitation is not visualized. FINDINGS  Left Ventricle: Left ventricular ejection fraction, by estimation, is <20%. The left ventricle has severely decreased function. The left ventricle demonstrates global hypokinesis. The left ventricular internal cavity size was moderately dilated. There is mild left ventricular hypertrophy. Left ventricular diastolic parameters are consistent with Grade II diastolic dysfunction (pseudonormalization). Right Ventricle: The right ventricular size is not well visualized. No increase in right ventricular wall thickness. Right ventricular systolic function is moderately reduced. Left Atrium: Left atrial size was normal in  size. Right Atrium: Right atrial size was normal in size. Pericardium: There is no evidence of pericardial effusion. Mitral Valve: The mitral valve is normal in structure. No evidence of mitral valve regurgitation. MV peak gradient, 3.4 mmHg. The mean mitral valve gradient is 1.0 mmHg. Tricuspid Valve: The tricuspid valve is normal in structure. Tricuspid valve regurgitation is not demonstrated. Aortic Valve: The aortic valve is tricuspid. Aortic valve regurgitation is not visualized. Aortic valve mean gradient measures 2.0 mmHg. Aortic valve peak gradient measures 3.5 mmHg. Aortic valve area, by VTI measures 2.61 cm. Pulmonic Valve: The pulmonic valve was normal in structure. Pulmonic valve regurgitation is not visualized. Aorta: The aortic root is normal in size and structure. Venous: The inferior vena cava was not well visualized. IAS/Shunts: No atrial level shunt detected by color flow Doppler.  LEFT VENTRICLE PLAX 2D LVIDd:         6.64 cm      Diastology LVIDs:         6.19 cm      LV e' medial:    4.35 cm/s LV PW:         1.74 cm      LV E/e' medial:  10.7 LV IVS:        1.01 cm      LV e' lateral:   6.20 cm/s LVOT diam:     2.30 cm      LV E/e' lateral: 7.5 LV SV:         38 LV SV Index:   15 LVOT Area:     4.15 cm  LV Volumes (MOD) LV vol d, MOD A2C: 205.0 ml LV vol d, MOD A4C: 259.0 ml LV vol s, MOD A2C: 177.0 ml LV vol s, MOD A4C: 197.0 ml LV SV MOD A2C:     28.0 ml LV SV MOD A4C:     259.0 ml LV SV MOD BP:      60.1 ml RIGHT VENTRICLE RV Basal diam:  4.84 cm RV S prime:     7.94 cm/s TAPSE (M-mode): 3.7 cm LEFT ATRIUM             Index        RIGHT ATRIUM           Index LA diam:        3.60 cm 1.41 cm/m   RA Area:     19.90 cm LA Vol (A2C):   60.2 ml 23.63 ml/m  RA Volume:   59.50 ml  23.36 ml/m LA Vol (A4C):   35.8 ml 14.05 ml/m LA Biplane Vol: 47.5 ml 18.65 ml/m  AORTIC VALVE                    PULMONIC VALVE AV Area (Vmax):  2.44 cm     PV Vmax:        0.49 m/s AV Area (Vmean):   2.41 cm      PV Vmean:       32.400 cm/s AV Area (VTI):     2.61 cm     PV VTI:         0.079 m AV Vmax:           93.30 cm/s   PV Peak grad:   0.9 mmHg AV Vmean:          74.900 cm/s  PV Mean grad:   0.5 mmHg AV VTI:            0.144 m      RVOT Peak grad: 5 mmHg AV Peak Grad:      3.5 mmHg AV Mean Grad:      2.0 mmHg LVOT Vmax:         54.80 cm/s LVOT Vmean:        43.400 cm/s LVOT VTI:          0.090 m LVOT/AV VTI ratio: 0.63  AORTA Ao Root diam: 3.07 cm MITRAL VALVE               TRICUSPID VALVE MV Area (PHT): 6.37 cm    TR Peak grad:   11.3 mmHg MV Area VTI:   2.74 cm    TR Vmax:        168.00 cm/s MV Peak grad:  3.4 mmHg MV Mean grad:  1.0 mmHg    SHUNTS MV Vmax:       0.92 m/s    Systemic VTI:  0.09 m MV Vmean:      46.5 cm/s   Systemic Diam: 2.30 cm MV Decel Time: 119 msec    Pulmonic VTI:  0.165 m MV E velocity: 46.70 cm/s MV A velocity: 45.40 cm/s MV E/A ratio:  1.03 Debbe Odea MD Electronically signed by Debbe Odea MD Signature Date/Time: 11/06/2021/1:26:57 PM    Final         Scheduled Meds:   stroke: mapping our early stages of recovery book   Does not apply Once   aspirin EC  81 mg Oral Daily   atorvastatin  80 mg Oral Daily   carvedilol  6.25 mg Oral BID WC   enoxaparin (LOVENOX) injection  0.5 mg/kg Subcutaneous Q24H   insulin aspart  0-9 Units Subcutaneous TID WC   multivitamin with minerals  1 tablet Oral Daily   predniSONE  20 mg Oral BID WC   sacubitril-valsartan  1 tablet Oral BID   spironolactone  12.5 mg Oral Daily   torsemide  40 mg Oral BID   Continuous Infusions:   LOS: 2 days    Time spent: 32 minutes    Marrion Coy, MD [facility]   To contact the attending provider between 7A-7P or the covering provider during after hours 7P-7A, please log into the web site [email or web address] and access using universal [place] password for that web site. If you do not have the password, please call the hospital operator.  11/07/2021, 2:21 PM

## 2021-11-07 NOTE — Progress Notes (Signed)
Inpatient Diabetes Program Recommendations  AACE/ADA: New Consensus Statement on Inpatient Glycemic Control (2015)  Target Ranges:  Prepandial:   less than 140 mg/dL      Peak postprandial:   less than 180 mg/dL (1-2 hours)      Critically ill patients:  140 - 180 mg/dL  Results for Robert, Mccann (MRN [id number]) as of 11/07/2021 07:19  Ref. Range 11/06/2021 00:43 11/06/2021 07:58 11/06/2021 11:24 11/06/2021 16:17 11/06/2021 20:49  Glucose-Capillary Latest Ref Range: 70 - 99 mg/dL 235 (H) 573 (H)  3 units Novolog  249 (H)  3 units Novolog  254 (H)  5 units Novolog  326 (H)  Results for Robert, Mccann (MRN [id number]) as of 11/07/2021 09:20  Ref. Range 11/07/2021 07:55  Glucose-Capillary Latest Ref Range: 70 - 99 mg/dL 623 (H)    Home DM Meds: Metformin 500 mg BID    Farxiga 10 mg Daily   Current Orders: Novolog 0-9 units TID Prednisone 20 mg BID     MD- Note CBGs elevated possibly due to BID dosing of Prednisone.  Home oral DM meds currently on hold.  Please consider:  1. Start Semglee 7 units Daily (0.05 units/kg)  2. Start Novolog Meal Coverage: Novolog 4 units TID with meals Hold if pt eats <50% of meal, Hold if pt NPO   --Will follow patient during hospitalization--  Ambrose Finland RN, MSN, CDE Diabetes Coordinator Inpatient Glycemic Control Team Team Pager: [phone] (8a-5p)

## 2021-11-07 NOTE — Progress Notes (Signed)
Neurology progress note  S: Patient states he is feeling vastly improved, minimal speech delay but weakness has resolved. Cardiology is planning for TEE tomorrow given his markedly decreased EF and global LV hypokinesis. BLE US showed no e/o DVT.  O:  Vitals:   11/07/21 1155 11/07/21 1639  BP: 105/76 98/76  Pulse: 86 83  Resp:  18  Temp:  98.6 F (37 C)  SpO2:  100%   Physical Exam Gen: A&O x4, NAD HEENT: Atraumatic, normocephalic;mucous membranes moist; oropharynx clear, tongue without atrophy or fasciculations. Neck: Supple, trachea midline. Resp: CTAB, no w/r/r CV: RRR, no m/g/r; nml S1 and S2. 2+ symmetric peripheral pulses. Abd: soft/NT/ND; nabs x 4 quad Extrem: Nml bulk; no cyanosis, clubbing, or edema.   Neuro: *MS: A&O x4. Follows multi-step commands.  *Speech: fluid, mild dysarthria, able to name and repeat *CN:    I: Deferred   II,III: PERRLA, VFF by confrontation, optic discs unable to be visualized 2/2 pupillary constriction   III,IV,VI: EOMI w/o nystagmus, no ptosis   V: Sensation intact from V1 to V3 to LT   VII: Eyelid closure was full.  Subtle L NLF flattening   VIII: Hearing intact to voice   IX,X: Voice normal, palate elevates symmetrically    XI: SCM/trap 5/5 bilat   XII: Tongue protrudes midline, no atrophy or fasciculations    *Motor:   Normal bulk.  No tremor, rigidity or bradykinesia. No pronator drift. All extremities 5/5 throughout.  *Sensory: Intact to light touch, pinprick, temperature vibration throughout. Symmetric. Propioception intact bilat.  No double-simultaneous extinction.  *Coordination:  Finger-to-nose, heel-to-shin, rapid alternating motions were intact. *Reflexes:  2+ and symmetric throughout without clonus; toes down-going bilat *Gait: deferred   NIHSS = 1 facial asymmetry  Stroke workup this hospitalization:   MRI brain wo contrast Large acute posterior right MCA territory infarct without hemorrhage or mass effect.   CTA  H&N 1. Redemonstrated right posterior PCA territory infarcts, without evidence of hemorrhagic conversion. 2. Diminutive right M2 and M3 branches, with diminished opacification and likely multifocal narrowing. 3. Hypoplastic left A2, which is possibly occluded distally. 4. No hemodynamically significant stenosis in the neck.   TTE  1. Left ventricular ejection fraction, by estimation, is <20%. The left  ventricle has severely decreased function. The left ventricle demonstrates  global hypokinesis. The left ventricular internal cavity size was  moderately dilated. There is mild left  ventricular hypertrophy. Left ventricular diastolic parameters are  consistent with Grade II diastolic dysfunction (pseudonormalization).   2. Right ventricular systolic function is moderately reduced. The right  ventricular size is not well visualized.   3. The mitral valve is normal in structure. No evidence of mitral valve  regurgitation.   4. The aortic valve is tricuspid. Aortic valve regurgitation is not  visualized.  IAS/Shunts: No atrial level shunt detected by color flow Doppler.  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 He was continued on eliquis until this AM when hospitalist and I agreed to hold it in the setting of acute ischemic stroke (since PE was 4 mos out). He is currently on ASA 81mg  daily only. His TTE shows marked reduction in EF and global hypokinesis which increases his risk for central embolic source. There was no intracardiac clot identified on TTE.  TEE planned by cardiology tomorrow.    but I would recommend consulting cardiology for both his heart failure and consideration of TTE. He will likely need hypercoag workup as well considering his hx PE. There was no shunt identified on TTE but he did  report his LLE was feeling warm on admission so I have ordered BLE [place] r/o DVT.   Recommendations    - Goal normotension >2 days out from acute event, avoid hypotension - Hypercoag workup ordered to be drawn tmrw AM with exception of antithrombin functional activity which will not be accurate given patient is on lovenox. Note: eliquis will have been held x36 hrs at time of lab draw - Per cardiology recommendations, current stroke may be considered an eliquis failure. Pending TEE results may consider restarting anticoagulation with warfarin rather than eliquis for this reason. He will need head CT to r/o interval hemorrhagic conversion prior to restarting [facility] when we decide to do so.  - Continue ASA 81mg  daily while anticoagulation is being held. - Cardiology consulting; appreciate recs - Atorvastatin 80mg  daily - Glycemic control per primary team - q4 hr neuro checks - STAT head CT for any change in neuro exam - Tele - PT/OT/SLP - Stroke education - Amb referral to neurology upon discharge    Will continue to follow.  Robert Monks, MD [facility] [phone]  If 7pm- 7am, please page neurology on call as listed in [place].

## 2021-11-08 ENCOUNTER — Encounter: Payer: Self-pay | Admitting: Anesthesiology

## 2021-11-08 ENCOUNTER — Inpatient Hospital Stay (HOSPITAL_COMMUNITY)
Admit: 2021-11-08 | Discharge: 2021-11-08 | Disposition: A | Discharge status: Home / Self Care | Payer: BC Managed Care – PPO | Attending: Physician Assistant | Admitting: Physician Assistant

## 2021-11-08 ENCOUNTER — Inpatient Hospital Stay: Payer: BC Managed Care – PPO

## 2021-11-08 ENCOUNTER — Encounter
Admission: EM | Disposition: A | Discharge status: Home / Self Care | Payer: Self-pay | Source: Home / Self Care | Attending: Internal Medicine

## 2021-11-08 DIAGNOSIS — I6389 Other cerebral infarction: Secondary | ICD-10-CM

## 2021-11-08 DIAGNOSIS — I639 Cerebral infarction, unspecified: Secondary | ICD-10-CM

## 2021-11-08 DIAGNOSIS — I5020 Unspecified systolic (congestive) heart failure: Secondary | ICD-10-CM

## 2021-11-08 DIAGNOSIS — I2699 Other pulmonary embolism without acute cor pulmonale: Secondary | ICD-10-CM

## 2021-11-08 HISTORY — PX: TEE WITHOUT CARDIOVERSION: SHX5443

## 2021-11-08 LAB — GLUCOSE, CAPILLARY
Glucose-Capillary: 207 mg/dL — ABNORMAL HIGH (ref 70–99)
Glucose-Capillary: 214 mg/dL — ABNORMAL HIGH (ref 70–99)
Glucose-Capillary: 292 mg/dL — ABNORMAL HIGH (ref 70–99)
Glucose-Capillary: 390 mg/dL — ABNORMAL HIGH (ref 70–99)
Glucose-Capillary: 498 mg/dL — ABNORMAL HIGH (ref 70–99)
Glucose-Capillary: 188 mg/dL — ABNORMAL HIGH (ref 70–99)

## 2021-11-08 LAB — BASIC METABOLIC PANEL
Anion gap: 10 (ref 5–15)
BUN: 22 mg/dL — ABNORMAL HIGH (ref 6–20)
CO2: 31 mmol/L (ref 22–32)
Calcium: 9 mg/dL (ref 8.9–10.3)
Chloride: 92 mmol/L — ABNORMAL LOW (ref 98–111)
Creatinine, Ser: 1 mg/dL (ref 0.61–1.24)
GFR, Estimated: >60 mL/min (ref >60)
Glucose, Bld: 232 mg/dL — ABNORMAL HIGH (ref 70–99)
Potassium: 3.5 mmol/L (ref 3.5–5.1)
Sodium: 133 mmol/L — ABNORMAL LOW (ref 135–145)

## 2021-11-08 LAB — MAGNESIUM: Magnesium: 2.1 mg/dL (ref 1.7–2.4)

## 2021-11-08 SURGERY — ECHOCARDIOGRAM, TRANSESOPHAGEAL
Anesthesia: Moderate Sedation

## 2021-11-08 MED ORDER — MIDAZOLAM HCL 2 MG/2ML IJ SOLN
INTRAMUSCULAR | Status: AC | PRN
  Administered 2021-11-08 (×3): 1 mg via INTRAVENOUS

## 2021-11-08 MED ORDER — MIDAZOLAM HCL 2 MG/2ML IJ SOLN
INTRAMUSCULAR | Status: AC
  Filled 2021-11-08: qty 4

## 2021-11-08 MED ORDER — LIDOCAINE VISCOUS HCL 2 % MT SOLN
OROMUCOSAL | Status: AC
  Filled 2021-11-08: qty 15

## 2021-11-08 MED ORDER — INSULIN ASPART 100 UNIT/ML IJ SOLN
0.0000 [IU] | Freq: Three times a day (TID) | INTRAMUSCULAR | Status: DC
  Administered 2021-11-09 (×2): 7 [IU] via SUBCUTANEOUS
  Administered 2021-11-09: 11 [IU] via SUBCUTANEOUS
  Filled 2021-11-08 (×2): qty 1

## 2021-11-08 MED ORDER — INSULIN GLARGINE-YFGN 100 UNIT/ML ~~LOC~~ SOLN
17.0000 [IU] | Freq: Every day | SUBCUTANEOUS | Status: DC
  Administered 2021-11-08: 22:00:00 17 [IU] via SUBCUTANEOUS
  Filled 2021-11-08 (×2): qty 0.17

## 2021-11-08 MED ORDER — FENTANYL CITRATE (PF) 100 MCG/2ML IJ SOLN
INTRAMUSCULAR | Status: AC
  Filled 2021-11-08: qty 2

## 2021-11-08 MED ORDER — BUTAMBEN-TETRACAINE-BENZOCAINE 2-2-14 % EX AERO
INHALATION_SPRAY | CUTANEOUS | Status: AC
  Filled 2021-11-08: qty 5

## 2021-11-08 MED ORDER — LIDOCAINE VISCOUS HCL 2 % MT SOLN
OROMUCOSAL | Status: AC | PRN
  Administered 2021-11-08: 15 mL via OROMUCOSAL

## 2021-11-08 MED ORDER — INSULIN ASPART 100 UNIT/ML IJ SOLN: 0.0000 [IU] | Freq: Every day | INTRAMUSCULAR | Status: DC

## 2021-11-08 MED ORDER — FENTANYL CITRATE (PF) 100 MCG/2ML IJ SOLN
INTRAMUSCULAR | Status: AC | PRN
  Administered 2021-11-08 (×2): 25 ug via INTRAVENOUS

## 2021-11-08 NOTE — Progress Notes (Signed)
Inpatient Diabetes Program Recommendations  AACE/ADA: New Consensus Statement on Inpatient Glycemic Control (2015)  Target Ranges:  Prepandial:   less than 140 mg/dL      Peak postprandial:   less than 180 mg/dL (1-2 hours)      Critically ill patients:  140 - 180 mg/dL  Results for BETHANY, CUMMING (MRN [id number]) as of 11/08/2021 13:43  Ref. Range 11/07/2021 07:55 11/07/2021 11:56 11/07/2021 16:16 11/07/2021 22:02  Glucose-Capillary Latest Ref Range: 70 - 99 mg/dL 419 (H) 379 (H) 024 (H) 279 (H)  Results for DRACO, MALCZEWSKI (MRN [id number]) as of 11/08/2021 13:43  Ref. Range 11/08/2021 08:19 11/08/2021 10:16 11/08/2021 12:27  Glucose-Capillary Latest Ref Range: 70 - 99 mg/dL 242 (H) 683 (H) 419 (H)    Home DM Meds: Metformin 500 mg BID    Farxiga 10 mg Daily     Current Orders: Novolog 0-9 units TID   Semglee 12 units QHS Prednisone 20 mg BID    MD- Note CBGs elevated possibly due to BID dosing of Prednisone.  Home oral DM meds currently on hold.  Note that Semglee started last PM.  CBGs remain elevated today.   Please consider:  1. Increase Semglee slightly to 15 units QHS  2. Start Novolog Meal Coverage: Novolog 4 units TID with meals Hold if pt eats <50% of meal, Hold if pt NPO    --Will follow patient during hospitalization--  Ambrose Finland RN, MSN, CDE Diabetes Coordinator Inpatient Glycemic Control Team Team Pager: [phone] (8a-5p)

## 2021-11-08 NOTE — Progress Notes (Signed)
Progress Note  Patient Name: Robert Mccann Date of Encounter: 11/08/2021  Primary Cardiologist: Agbor-Etang  Subjective   No chest pain, dyspnea, or palpitations. Had a headache overnight, this is resolved. He is for TEE today.   Inpatient Medications    Scheduled Meds:   stroke: mapping our early stages of recovery book   Does not apply Once   aspirin EC  81 mg Oral Daily   atorvastatin  80 mg Oral Daily   carvedilol  6.25 mg Oral BID WC   enoxaparin (LOVENOX) injection  0.5 mg/kg Subcutaneous Q24H   insulin aspart  0-9 Units Subcutaneous TID WC   insulin glargine-yfgn  12 Units Subcutaneous Daily   multivitamin with minerals  1 tablet Oral Daily   predniSONE  20 mg Oral BID WC   sacubitril-valsartan  1 tablet Oral BID   spironolactone  12.5 mg Oral Daily   torsemide  40 mg Oral BID   Continuous Infusions:  sodium chloride Stopped (11/08/21 0633)   PRN Meds: acetaminophen **OR** acetaminophen, magnesium hydroxide, ondansetron **OR** ondansetron (ZOFRAN) IV, traZODone   Vital Signs    Vitals:   11/07/21 1639 11/07/21 2004 11/08/21 0107 11/08/21 0516  BP: 98/76 117/64 114/70 122/81  Pulse: 83 91 78 84  Resp: 18 16 16 16   Temp: 98.6 F (37 C) 97.8 F (36.6 C) 98.1 F (36.7 C) 97.7 F (36.5 C)  TempSrc:      SpO2: 100% 98% 98% 100%  Weight:      Height:        Intake/Output Summary (Last 24 hours) at 11/08/2021 0733 Last data filed at 11/08/2021 0650 Gross per 24 hour  Intake 621.6 ml  Output --  Net 621.6 ml   Filed Weights   11/05/21 1727 11/06/21 0730  Weight: (!) 137.7 kg (!) 138.1 kg    Telemetry    SR with blocked PAC - Personally Reviewed  ECG    No new tracings - Personally Reviewed  Physical Exam   GEN: No acute distress.   Neck: No JVD. Cardiac: RRR, no murmurs, rubs, or gallops.  Respiratory: Clear to auscultation bilaterally.  GI: Soft, nontender, non-distended.   MS: No edema; No deformity. Neuro:  Alert and oriented x 3;  Nonfocal.  Psych: Normal affect.  Labs    Chemistry Recent Labs  Lab 11/05/21 1734 11/06/21 0417 11/07/21 0623 11/08/21 0511  NA 133* 136 132* 133*  K 3.4* 3.1* 3.3* 3.5  CL 92* 98 92* 92*  CO2 29 29 28 31   GLUCOSE 261* 214* 209* 232*  BUN 18 15 17  22*  CREATININE 1.14 0.89 0.91 1.00  CALCIUM 9.4 10.2 9.2 9.0  PROT 8.8*  --   --   --   ALBUMIN 4.1  --   --   --   AST 31  --   --   --   ALT 19  --   --   --   ALKPHOS 89  --   --   --   BILITOT 1.2  --   --   --   GFRNONAA >60 >60 >60 >60  ANIONGAP 12 9 12 10      Hematology Recent Labs  Lab 11/05/21 1734 11/06/21 0417 11/07/21 0623  WBC 10.0 7.6 13.9*  RBC 5.64 5.55 5.67  HGB 16.5 16.8 16.9  HCT 48.8 48.5 49.2  MCV 86.5 87.4 86.8  MCH 29.3 30.3 29.8  MCHC 33.8 34.6 34.3  RDW 13.3 13.4 13.3  PLT 329  285 326    Cardiac EnzymesNo results for input(s): TROPONINI in the last 168 hours. No results for input(s): TROPIPOC in the last 168 hours.   BNPNo results for input(s): BNP, PROBNP in the last 168 hours.   DDimer No results for input(s): DDIMER in the last 168 hours.   Radiology    CT ANGIO HEAD NECK W WO CM  Result Date: 11/06/2021 IMPRESSION: 1. Redemonstrated right posterior PCA territory infarcts, without evidence of hemorrhagic conversion. 2. Diminutive right M2 and M3 branches, with diminished opacification and likely multifocal narrowing. 3. Hypoplastic left A2, which is possibly occluded distally. 4. No hemodynamically significant stenosis in the neck. Electronically Signed   By: Merilyn Baba M.D.   On: 11/06/2021 14:38   US Venous Img Lower Bilateral (DVT)  Result Date: 11/07/2021 IMPRESSION: No evidence of deep venous thrombosis in either lower extremity. Electronically Signed   By: Aletta Edouard M.D.   On: 11/07/2021 09:12   Cardiac Studies   2D echo 06/28/2021:  1. 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  Recommend CMR for better analysis.. Left ventricular ejection fraction, by estimation, is <20%. The left ventricle has severely decreased function. The left ventricle demonstrates global hypokinesis. The left ventricular internal cavity size was severely dilated. Left ventricular diastolic parameters are consistent with Grade II diastolic dysfunction (pseudonormalization).   2. Right ventricular systolic function is severely reduced. The right  ventricular size is not well visualized. There is normal pulmonary artery  systolic pressure.   3. Right atrial size was mildly dilated.   4. The mitral valve is normal in structure. Mild mitral valve  regurgitation.   5. The aortic valve is normal in structure. Aortic valve regurgitation is  not visualized.   6. The inferior vena cava is dilated in size with <50% respiratory  variability, suggesting right atrial pressure of 15 mmHg.  __________   2D echo 11/06/2021: 1. Left ventricular ejection fraction, by estimation, is <20%. The left  ventricle has severely decreased function. The left ventricle demonstrates  global hypokinesis. The left ventricular internal cavity size was  moderately dilated. There is mild left  ventricular hypertrophy. Left ventricular diastolic parameters are  consistent with Grade II diastolic dysfunction (pseudonormalization).   2. Right ventricular systolic function is moderately reduced. The right  ventricular size is not well visualized.   3. The mitral valve is normal in structure. No evidence of mitral valve  regurgitation.   4. The aortic valve is tricuspid. Aortic valve regurgitation is not  visualized.   Patient Profile     25 y.o. 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  Quinn Axe, MD.  Assessment & Plan    1. Embolic CVA: -Cannot exclude failure of Eliquis at this time -NPO for TEE today -Anticoagulation currently held, for total of 5 days, to minimize risk of hemorrhagic conversion with recommendation to repeat head CT prior to reinitiating therapy to rule out interval hemorrhagic conversion -Currently on ASA 81 mg daily and atorvastatin 80 mg -Monitor on telemetry with recommendation for outpatient cardiac monitoring at discharge given increased risk of A. fib associated with LV noncompaction -Hypercoagulable workup pending -[facility] has been requested to perform a transesophageal echocardiogram on  Buck Mam for CVA.  After careful review of history and examination, the risks and benefits of transesophageal echocardiogram have been explained including risks of esophageal damage, perforation (1:10,000 risk), bleeding, pharyngeal hematoma as well as other potential complications associated with conscious sedation including aspiration, arrhythmia, respiratory failure and death. Alternatives to treatment were discussed, questions were answered. Patient is willing to proceed   2.  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: -He appears euvolemic and well compensated -Continue current GDMT including carvedilol, Entresto, spironolactone, and torsemide -Prior to discharge, consider addition of SGLT2i, if no longer limited by financial constraints -Borderline low BP has precluded escalation of GDMT -Repeat surface echo this admission shows persistent cardiomyopathy with an EF less than 20% -Consider cardiac MRI in the outpatient setting, if body circumference no longer precludes this imaging study given weight loss, along with referral to genetic counselor and advanced heart failure in [place], along with referral to EP for consideration of ICD -Uncertain if he would be a candidate for LVAD given history of stroke, though with continued weight loss, he may  be a transplant candidate   3.  History of unprovoked PE: -Status post thrombectomy in 05/2021 -Previously on Eliquis, and denies missing any doses -When cleared by neurology, consider initiation of Coumadin in place of Eliquis given inability to exclude failure of DOAC   4.  Morbid obesity: -Continued weight loss is encouraged to heart healthy diet and regular exercise  For questions or updates, please contact Platon Arocho Please consult [email or web address] for contact info under Cardiology/STEMI.    Signed, Christell Faith, PA-C [facility] Pager: [phone] 11/08/2021, 7:33 AM

## 2021-11-08 NOTE — Progress Notes (Addendum)
Neurology progress note  S: Patient has no subjective neurologic complaints today although after L sided visual neglect noted on exam he agreed he does not see well out of L periphery in both eyes. TEE no intracardiac clot. CT head showed small amount of petechial hemorrhage, no frank hematoma.  O:  Vitals:   11/08/21 1224 11/08/21 1646  BP: 106/71 106/67  Pulse: 84 83  Resp: 19 18  Temp: 98 F (36.7 C) 98.7 F (37.1 C)  SpO2: 100% 99%   Physical Exam Gen: A&O x4, NAD HEENT: Atraumatic, normocephalic;mucous membranes moist; oropharynx clear, tongue without atrophy or fasciculations. Neck: Supple, trachea midline. Resp: CTAB, no w/r/r CV: RRR, no m/g/r; nml S1 and S2. 2+ symmetric peripheral pulses. Abd: soft/NT/ND; nabs x 4 quad Extrem: Nml bulk; no cyanosis, clubbing, or edema.   Neuro: *MS: A&O x4. Follows multi-step commands.  *Speech: fluid, mild dysarthria, able to name and repeat *CN:    I: Deferred   II,III: PERRLA, L homonymous hemianopsia, optic discs unable to be visualized 2/2 pupillary constriction   III,IV,VI: EOMI w/o nystagmus, no ptosis   V: Sensation intact from V1 to V3 to LT   VII: Eyelid closure was full.  Subtle L NLF flattening   VIII: Hearing intact to voice   IX,X: Voice normal, palate elevates symmetrically    XI: SCM/trap 5/5 bilat   XII: Tongue protrudes midline, no atrophy or fasciculations    *Motor:   Normal bulk.  No tremor, rigidity or bradykinesia. No pronator drift. All extremities 5/5 throughout.  *Sensory: Intact to light touch, pinprick, temperature vibration throughout. Symmetric. Propioception intact bilat.  No double-simultaneous extinction.  *Coordination:  Finger-to-nose, heel-to-shin, rapid alternating motions were intact. *Reflexes:  2+ and symmetric throughout without clonus; toes down-going bilat *Gait: deferred   NIHSS = 1 facial asymmetry, visual neglect  Stroke workup this hospitalization:   MRI brain wo contrast Large  acute posterior right MCA territory infarct without hemorrhage or mass effect.   CTA H&N 1. Redemonstrated right posterior PCA territory infarcts, without evidence of hemorrhagic conversion. 2. Diminutive right M2 and M3 branches, with diminished opacification and likely multifocal narrowing. 3. Hypoplastic left A2, which is possibly occluded distally. 4. No hemodynamically significant stenosis in the neck.   TTE  1. Left ventricular ejection fraction, by estimation, is <20%. The left  ventricle has severely decreased function. The left ventricle demonstrates  global hypokinesis. The left ventricular internal cavity size was  moderately dilated. There is mild left  ventricular hypertrophy. Left ventricular diastolic parameters are  consistent with Grade II diastolic dysfunction (pseudonormalization).   2. Right ventricular systolic function is moderately reduced. The right  ventricular size is not well visualized.   3. The mitral valve is normal in structure. No evidence of mitral valve  regurgitation.   4. The aortic valve is tricuspid. Aortic valve regurgitation is not  visualized.  IAS/Shunts: No atrial level shunt detected by color flow Doppler.  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 From a stroke standpoint he is doing remarkably well and his only deficit is L sided visual neglect OU. Because of this I have told him he cannot drive until cleared by OT as outpatient.  TEE showed no intracardiac clot. CT head 11/9 shows small amt petechial hemorrhage w/o frank hemorrhagic conversion. Eliquis is currently being held and he is on ASA 81mg  daily and lovenox weight-based DVT prophylaxis dose.   Recommendations    -  Goal normotension >2 days out from acute event, avoid hypotension - Hypercoag workup drawn 11/9 with exception of antithrombin functional activity which will not be accurate given patient is on lovenox. Note: eliquis will have been held x36 hrs at time of lab draw. Most of this will result after hospital discharge and should be followed up as outpatient by cardiology - Given petechial hemorrhage on CT I will hold off on restarting eliquis for now until repeat head CT tomorrow. I will ask cardiology if they feel ASA needs to be continued after eliquis restart. From a secondary stroke prevention standpoint there is no neurologic indication for additional antiplatelet in the setting of therapeutic anticoagulation, but adding ASA to eliquis will increase bleeding risk. - Continue ASA 81mg  daily while anticoagulation is being held. - Cardiology consulting; appreciate recs - Atorvastatin 80mg  daily - Glycemic control per primary team - q4 hr neuro checks - STAT head CT for any change in neuro exam - Tele - PT/OT/SLP - Stroke education - No work restrictions after hospital discharge but 2/2 L sided visual neglect patient will need to be cleared by OT functional assessment as o/p before he resumes driving. - Amb referral to neurology upon discharge    Will continue to follow.  Su Monks, MD [facility] [phone]  If 7pm- 7am, please page neurology on call as listed in [place].

## 2021-11-08 NOTE — Progress Notes (Signed)
Occupational Therapy Treatment Patient Details Name: Robert Mccann MRN: [id number] DOB: [birth year] Today's Date: 11/08/2021   History of present illness 25 y.o. male with medical history significant for hypertension, HFrEF with cardiomyopathy, type 2 diabetes mellitus and pulmonary embolism on Eliquis, who presented to the emergency room with acute onset of dysarthria, expressive dysphasia, left arm numbness, and blurred vision. CT head revealed bilateral cerebral infarcts including a moderate-sized acute to  subacute right temporoparietal infarct. MRI brain revealed large acute posterior right MCA territory infarct. EKG showed sinus tachycardia with rate of 105 with right atrial enlargement and LVH.  Noncontrasted CT scan performed on 11/05/21 revealed bilateral cerebral infarcts including a moderate-sized acute to subacute right temporoparietal infarct with no acute intracranial hemorrhage.   OT comments  Pt seen for OT treatment on this date. Upon arrival to room, pt awake and seated upright in bed with visitor present. Pt reporting no pain and agreeable to OT tx. Pt continues to perform standing ADLs and functional mobility MOD-I.  This date, pt engaged in visual scanning and visual spatial activity, which required pt to scan room to identify grocery items and write cost of grocery items on worksheet. Pt required MIN verbal cues for initiating environmental scanning L>R and had difficulty writing within the worksheet's boxes and on lines; pt reported that difficulty with line spacing on worksheet and difficulty spelling familiar words is different from baseline. During line bisection test; pt had difficulty correctly marking center of horizontal lines (30% of marks were >59mm from center, with majority of markings toward R side), indicating L-side neglect. At end of session, pt encouraged to continue working on worksheets provided at last session (two mazes and two word search puzzles) in order for pt  to work on visual scanning and visual spatial function independently. Pt continues to benefit from skilled OT services to maximize return to PLOF and minimize risk of future falls, injury, caregiver burden, and readmission. Will continue to follow POC. Discharge recommendation updated to outpatient OT to address visual neglect and spatial awareness impairments; TOC informed and pt in agreement.    Recommendations for follow up therapy are one component of a multi-disciplinary discharge planning process, led by the attending physician.  Recommendations may be updated based on patient status, additional functional criteria and insurance authorization.    Follow Up Recommendations  Outpatient OT    Assistance Recommended at Discharge PRN  Equipment Recommendations  None recommended by OT       Precautions / Restrictions Precautions Precautions: Fall Restrictions Weight Bearing Restrictions: No       Mobility Bed Mobility Overal bed mobility: Independent                  Transfers Overall transfer level: Modified independent Equipment used: None                     Balance Overall balance assessment: Independent                                         ADL either performed or assessed with clinical judgement   ADL Overall ADL's : Modified independent                                       General ADL Comments: Pt performed standing  dressing and functional mobility of short household distances MOD-I     Vision Patient Visual Report: Peripheral vision impairment Vision Assessment?: Vision impaired- to be further tested in functional context Additional Comments: Pt with peripheral vision loss to L side, requiring MIN verbal cues for initiating environmental scanning toward L side.   Perception Perception Perception: Impaired Inattention/Neglect: Impaired-to be further tested in functional context (Mild left neglect as indicated  by line bisection test; pt had difficulty correctly marking center of horizontal lines (30% of marks were >75mm from center, with majority of markings toward R side)) Spatial Orientation: Pt with difficulty correctly writing within a box and on a line; pt reporting this is different from baseline Comments: Body scheme awareness WFL (able to use L UE to write on paper).       Cognition Arousal/Alertness: Awake/alert Behavior During Therapy: WFL for tasks assessed/performed Overall Cognitive Status: Within Functional Limits for tasks assessed                                            Exercises Other Exercises Other Exercises: Pt engaged in visual scanning and visual spatial activity, which required pt to scan room to identify grocery items and write cost of items on worksheet. Pt reported having difficulty writing within the worksheet's boxes and on the lines Other Exercises: Pt engaged in line bisection test. Pt had difficulty correctly marking center of horizontal lines (30% of marks were >91mm from center, with majority of markings toward R side) Other Exercises: Pt encouraged to continue working on worksheets provided at last session (two mazes and two word search puzzles) in order for pt to work on visual scanning and visual spatial function independently      General Comments HR 100s-120s during standing activity (briefly 130); RN informed    Pertinent Vitals/ Pain   Denies pain  Frequency  Min 3X/week        Progress Toward Goals  OT Goals(current goals can now be found in the care plan section)  Progress towards OT goals: Progressing toward goals  Acute Rehab OT Goals Patient Stated Goal: to improve ability to write/draw OT Goal Formulation: With patient Time For Goal Achievement: 11/20/21 Potential to Achieve Goals: Good  Plan Discharge plan needs to be updated;Frequency remains appropriate       AM-PAC OT "6 Clicks" Daily Activity     Outcome  Measure   Help from another person eating meals?: None Help from another person taking care of personal grooming?: A Little Help from another person toileting, which includes using toliet, bedpan, or urinal?: None Help from another person bathing (including washing, rinsing, drying)?: None Help from another person to put on and taking off regular upper body clothing?: None Help from another person to put on and taking off regular lower body clothing?: None 6 Click Score: 23    End of Session    OT Visit Diagnosis: Other symptoms and signs involving the nervous system (R29.898)   Activity Tolerance Patient tolerated treatment well   Patient Left in bed;with call bell/phone within reach;with family/visitor present   Nurse Communication Mobility status        Time: 7628-3151 OT Time Calculation (min): 26 min  Charges: OT General Charges $OT Visit: 1 Visit OT Treatments $Self Care/Home Management : 23-37 mins $Therapeutic Activity: 23-37 mins  Matthew Folks, OTR/L [facility] [phone]

## 2021-11-08 NOTE — Progress Notes (Signed)
PROGRESS NOTE    Robert Mccann  MMN:[id number] DOB: Sep 28, 1996 DOA: 11/05/2021 PCP: Nira Retort    Brief Narrative:  Robert Mccann is a 25 y.o. African-American male with medical history significant for hypertension, HFrEF with cardiomyopathy, type 2 diabetes mellitus and pulmonary embolism on Eliquis, who presented to the emergency room with acute onset of dysarthria and expressive dysphasia as well as left arm numbness and mild weakness for the last 2 to 3 days with a feeling of left leg warmth without numbness or weakness.  MRI of the brain showed a large right MCA stroke.  Treated with aspirin, lipitor.  Eliquis need to be held for 5 days poststroke    Consultants:  Cardiology, neurology  Procedures:   Antimicrobials:      Subjective: No sob, dizziness, cp. Little groogy from TEE.  Objective: Vitals:   11/07/21 2004 11/08/21 0107 11/08/21 0516 11/08/21 0820  BP: 117/64 114/70 122/81 106/75  Pulse: 91 78 84 89  Resp: 16 16 16 17   Temp: 97.8 F (36.6 C) 98.1 F (36.7 C) 97.7 F (36.5 C) 97.8 F (36.6 C)  TempSrc:    Oral  SpO2: 98% 98% 100% 99%  Weight:      Height:        Intake/Output Summary (Last 24 hours) at 11/08/2021 0937 Last data filed at 11/08/2021 0650 Gross per 24 hour  Intake 621.6 ml  Output --  Net 621.6 ml   Filed Weights   11/05/21 1727 11/06/21 0730  Weight: (!) 137.7 kg (!) 138.1 kg    Examination:  General exam: Appears calm and comfortable  Respiratory system: Clear to auscultation. Respiratory effort normal. Cardiovascular system: S1 & S2 heard, RRR. No JVD, murmurs, rubs, gallops or clicks.  Gastrointestinal system: Abdomen is nondistended, soft and nontender.  Normal bowel sounds heard. Central nervous system: Alert and oriented. Grossly intact Extremities no edema PsychiatryMood & affect appropriate.     Data Reviewed: I have personally reviewed following labs and imaging studies  CBC: Recent Labs  Lab  11/05/21 1734 11/06/21 0417 11/07/21 0623  WBC 10.0 7.6 13.9*  NEUTROABS 6.0  --  10.1*  HGB 16.5 16.8 16.9  HCT 48.8 48.5 49.2  MCV 86.5 87.4 86.8  PLT 329 285 326   Basic Metabolic Panel: Recent Labs  Lab 11/05/21 1734 11/06/21 0417 11/07/21 0623 11/08/21 0511  NA 133* 136 132* 133*  K 3.4* 3.1* 3.3* 3.5  CL 92* 98 92* 92*  CO2 29 29 28 31   GLUCOSE 261* 214* 209* 232*  BUN 18 15 17  22*  CREATININE 1.14 0.89 0.91 1.00  CALCIUM 9.4 10.2 9.2 9.0  MG  --   --  2.0 2.1   GFR: Estimated Creatinine Clearance: 159.8 mL/min (by C-G formula based on SCr of 1 mg/dL). Liver Function Tests: Recent Labs  Lab 11/05/21 1734  AST 31  ALT 19  ALKPHOS 89  BILITOT 1.2  PROT 8.8*  ALBUMIN 4.1   No results for input(s): LIPASE, AMYLASE in the last 168 hours. No results for input(s): AMMONIA in the last 168 hours. Coagulation Profile: Recent Labs  Lab 11/05/21 1734  INR 1.1   Cardiac Enzymes: No results for input(s): CKTOTAL, CKMB, CKMBINDEX, TROPONINI in the last 168 hours. BNP (last 3 results) No results for input(s): PROBNP in the last 8760 hours. HbA1C: Recent Labs    11/06/21 0417  HGBA1C 8.7*   CBG: Recent Labs  Lab 11/07/21 0755 11/07/21 1156 11/07/21 1616 11/07/21 2202 11/08/21 13/08/22  GLUCAP 213* 275* 270* 279* 207*   Lipid Profile: Recent Labs    11/06/21 0417  CHOL 231*  HDL 51  LDLCALC 151*  TRIG 143  CHOLHDL 4.5   Thyroid Function Tests: Recent Labs    11/05/21 1734  TSH 1.934   Anemia Panel: No results for input(s): VITAMINB12, FOLATE, FERRITIN, TIBC, IRON, RETICCTPCT in the last 72 hours. Sepsis Labs: No results for input(s): PROCALCITON, LATICACIDVEN in the last 168 hours.  Recent Results (from the past 240 hour(s))  Resp Panel by RT-PCR (Flu A&B, Covid) Nasopharyngeal Swab     Status: None   Collection Time: 11/05/21  5:34 PM   Specimen: Nasopharyngeal Swab; Nasopharyngeal(NP) swabs in vial transport medium  Result Value Ref  Range Status   SARS Coronavirus 2 by RT PCR NEGATIVE NEGATIVE Final    Comment: (NOTE) SARS-CoV-2 target nucleic acids are NOT DETECTED.  The SARS-CoV-2 RNA is generally detectable in upper respiratory specimens during the acute phase of infection. The lowest concentration of SARS-CoV-2 viral copies this assay can detect is 138 copies/mL. A negative result does not preclude SARS-Cov-2 infection and should not be used as the sole basis for treatment or other patient management decisions. A negative result may occur with  improper specimen collection/handling, submission of specimen other than nasopharyngeal swab, presence of viral mutation(s) within the areas targeted by this assay, and inadequate number of viral copies(<138 copies/mL). A negative result must be combined with clinical observations, patient history, and epidemiological information. The expected result is Negative.  Fact Sheet for Patients:  BloggerCourse.com  Fact Sheet for Healthcare Providers:  SeriousBroker.it  This test is no t yet approved or cleared by the [place] FDA and  has been authorized for detection and/or diagnosis of SARS-CoV-2 by FDA under an Emergency Use Authorization (EUA). This EUA will remain  in effect (meaning this test can be used) for the duration of the COVID-19 declaration under Section 564(b)(1) of the Act, 21 U.S.C.section 360bbb-3(b)(1), unless the authorization is terminated  or revoked sooner.       Influenza A by PCR NEGATIVE NEGATIVE Final   Influenza B by PCR NEGATIVE NEGATIVE Final    Comment: (NOTE) The Xpert Xpress SARS-CoV-2/FLU/RSV plus assay is intended as an aid in the diagnosis of influenza from Nasopharyngeal swab specimens and should not be used as a sole basis for treatment. Nasal washings and aspirates are unacceptable for Xpert Xpress SARS-CoV-2/FLU/RSV testing.  Fact Sheet for  Patients: BloggerCourse.com  Fact Sheet for Healthcare Providers: SeriousBroker.it  This test is not yet approved or cleared by the [place] FDA and has been authorized for detection and/or diagnosis of SARS-CoV-2 by FDA under an Emergency Use Authorization (EUA). This EUA will remain in effect (meaning this test can be used) for the duration of the COVID-19 declaration under Section 564(b)(1) of the Act, 21 U.S.C. section 360bbb-3(b)(1), unless the authorization is terminated or revoked.  Performed at [facility], [address] [place], [place] [address]          Radiology Studies: CT ANGIO HEAD NECK W WO CM  Result Date: 11/06/2021 CLINICAL DATA:  Neuro deficit, acute, stroke suspected EXAM: CT ANGIOGRAPHY HEAD AND NECK TECHNIQUE: Multidetector CT imaging of the head and neck was performed using the standard protocol during bolus administration of intravenous contrast. Multiplanar CT image reconstructions and MIPs were obtained to evaluate the vascular anatomy. Carotid stenosis measurements (when applicable) are obtained utilizing NASCET criteria, using the distal internal carotid diameter as the  denominator. CONTRAST:  39mL OMNIPAQUE IOHEXOL 350 MG/ML SOLN COMPARISON:  CT head 11/05/2021 and MRI head 11/05/2021 FINDINGS: CT HEAD FINDINGS Brain: Redemonstrated moderate size region of hypoattenuation involving the cortex and white matter of the right temporoparietal region, extending into the anterior right temporal lobe and right occipital lobes, consistent with the acute infarct seen on the 11/05/2021 CT and MRI. Additional hypodensity in the left medial parietal lobe, also unchanged. No definite acute hemorrhage. No mass, mass effect, or midline shift. No hydrocephalus or extra-axial collection. Vascular: See CTA findings below. Skull: Normal. Negative for fracture or focal lesion. Sinuses: Imaged portions are clear.  Orbits: No acute finding. Review of the MIP images confirms the above findings CTA NECK FINDINGS Aortic arch: 2 vessel arch with a common origin of the brachiocephalic and left common carotid arteries. Imaged portion shows no evidence of aneurysm or dissection. No significant stenosis of the major arch vessel origins. Right carotid system: No evidence of dissection, stenosis (50% or greater) or occlusion. Left carotid system: No evidence of dissection, stenosis (50% or greater) or occlusion. Vertebral arteries: Left dominant, with diminutive right vertebral artery. No evidence of dissection, stenosis (50% or greater) or occlusion. Skeleton: No acute osseous abnormality. Other neck: Negative. Upper chest: Negative. Review of the MIP images confirms the above findings CTA HEAD FINDINGS Anterior circulation: Both internal carotid arteries are patent to the termini, with mild narrowing of the left supraclinoid ICA (series 10, image 256). A1 segments patent. Anterior communicating artery is not definitively visualized. The left A2 is hypoplastic and is possibly occluded distally (series 10, images 268, 272, 274, 278). The left A2 and distal ACA segments are patent and well perfused. No M1 stenosis or occlusion. Normal MCA bifurcations. Right MCA branches demonstrate multifocal narrowing and somewhat diminished opacification (series 10, image is 275), possibly with collateralization, although there appears to be perfusion distally. Left distal MCA branches appear perfused. Posterior circulation: Left dominant system, with a diminutive right vertebral artery. Vertebral arteries patent to the vertebrobasilar junction without stenosis. Posterior inferior cerebral arteries patent bilaterally. Basilar patent to its distal aspect. Superior cerebral arteries patent bilaterally. PCAs well perfused to their distal aspects without stenosis. Diminutive posterior communicating arteries visualized bilaterally. Venous sinuses: As  permitted by contrast timing, patent. Anatomic variants: None significant Review of the MIP images confirms the above findings IMPRESSION: 1. Redemonstrated right posterior PCA territory infarcts, without evidence of hemorrhagic conversion. 2. Diminutive right M2 and M3 branches, with diminished opacification and likely multifocal narrowing. 3. Hypoplastic left A2, which is possibly occluded distally. 4. No hemodynamically significant stenosis in the neck. Electronically Signed   By: Wiliam Ke M.D.   On: 11/06/2021 14:38   US Venous Img Lower Bilateral (DVT)  Result Date: 11/07/2021 CLINICAL DATA:  Lower extremity edema, stroke and history of prior pulmonary embolism. EXAM: BILATERAL LOWER EXTREMITY VENOUS DOPPLER ULTRASOUND TECHNIQUE: Gray-scale sonography with graded compression, as well as color Doppler and duplex ultrasound were performed to evaluate the lower extremity deep venous systems from the level of the common femoral vein and including the common femoral, femoral, profunda femoral, popliteal and calf veins including the posterior tibial, peroneal and gastrocnemius veins when visible. The superficial great saphenous vein was also interrogated. Spectral Doppler was utilized to evaluate flow at rest and with distal augmentation maneuvers in the common femoral, femoral and popliteal veins. COMPARISON:  06/29/2021 FINDINGS: RIGHT LOWER EXTREMITY Common Femoral Vein: No evidence of thrombus. Normal compressibility, respiratory phasicity and response to augmentation. Saphenofemoral Junction:  No evidence of thrombus. Normal compressibility and flow on color Doppler imaging. Profunda Femoral Vein: No evidence of thrombus. Normal compressibility and flow on color Doppler imaging. Femoral Vein: No evidence of thrombus. Normal compressibility, respiratory phasicity and response to augmentation. Popliteal Vein: No evidence of thrombus. Normal compressibility, respiratory phasicity and response to  augmentation. Calf Veins: No evidence of thrombus. Normal compressibility and flow on color Doppler imaging. Superficial Great Saphenous Vein: No evidence of thrombus. Normal compressibility. Venous Reflux:  None. Other Findings: No evidence of superficial thrombophlebitis or abnormal fluid collection. LEFT LOWER EXTREMITY Common Femoral Vein: No evidence of thrombus. Normal compressibility, respiratory phasicity and response to augmentation. Saphenofemoral Junction: No evidence of thrombus. Normal compressibility and flow on color Doppler imaging. Profunda Femoral Vein: No evidence of thrombus. Normal compressibility and flow on color Doppler imaging. Femoral Vein: No evidence of thrombus. Normal compressibility, respiratory phasicity and response to augmentation. Popliteal Vein: No evidence of thrombus. Normal compressibility, respiratory phasicity and response to augmentation. Calf Veins: No evidence of thrombus. Normal compressibility and flow on color Doppler imaging. Superficial Great Saphenous Vein: No evidence of thrombus. Normal compressibility. Venous Reflux:  None. Other Findings: No evidence of superficial thrombophlebitis or abnormal fluid collection. IMPRESSION: No evidence of deep venous thrombosis in either lower extremity. Electronically Signed   By: Irish Lack M.D.   On: 11/07/2021 09:12        Scheduled Meds:   stroke: mapping our early stages of recovery book   Does not apply Once   aspirin EC  81 mg Oral Daily   atorvastatin  80 mg Oral Daily   carvedilol  6.25 mg Oral BID WC   enoxaparin (LOVENOX) injection  0.5 mg/kg Subcutaneous Q24H   insulin aspart  0-9 Units Subcutaneous TID WC   insulin glargine-yfgn  12 Units Subcutaneous Daily   multivitamin with minerals  1 tablet Oral Daily   predniSONE  20 mg Oral BID WC   sacubitril-valsartan  1 tablet Oral BID   spironolactone  12.5 mg Oral Daily   torsemide  40 mg Oral BID   Continuous Infusions:  sodium chloride Stopped  (11/08/21 8242)    Assessment & Plan:   Active Problems:   DM II (diabetes mellitus, type II), controlled (HCC)   HFrEF (heart failure with reduced ejection fraction) (HCC)   PE (pulmonary thromboembolism) (HCC)   Obesity, Class III, BMI 40-49.9 (morbid obesity) (HCC)   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 Hypertension urgency CT angiogram of the neck and head did not show significant occlusion. TTE showed ejection fraction less than 20%, no identified left ventricular thrombus. Due to large stroke with high risk for hemorrhagic conversion, Eliquis was on hold, will need to continue to be held for 5 days post stroke.   Continue aspirin and Lipitor. 11/9 TEE today-0 no ASD/VSD, no PFO. No thrombus Neurology following- if repeat ct head negative for bleed, can resume eliquis Venous US no dvt.    Chronic systolic congestive heart failure with ejection fraction less than 20%. no acute exacerbation Follow-up with cardiology as outpatient    Uncontrolled type 2 diabetes with hyperglycemia. Hemoglobin A1c 8.7 BG elevated likely due to steroids. Unsure why pt on steroids. Will dc Increase long-acting insulin to 17 units daily Continue R-ISS  Morbid obesity. Complicates overall care and prognosis  Hypokalemia. Hyponatremia Replaced and stable.      DVT prophylaxis: Lovenox Code Status: Full Family Communication: Family at  bedside Disposition Plan:  Status is: Inpatient  Remains inpatient appropriate because: Severity of illness requiring hospitalization.  Patient had large stroke, high risk for hemorrhagic conversion.  CT of the head pending.  TEE done today.            LOS: 3 days   Time spent: 35 minutes with more than 50% on COC    Lynn Ito, MD [facility] Pager [phone]  If 7PM-7AM, please contact night-coverage 11/08/2021, 9:37 AM

## 2021-11-08 NOTE — Progress Notes (Signed)
*  PRELIMINARY RESULTS* Echocardiogram Echocardiogram Transesophageal has been performed.  Joanette Gula Jyles Sontag 11/08/2021, 10:56 AM

## 2021-11-08 NOTE — Procedures (Signed)
Transesophageal Echocardiogram :  Indication: stroke  Procedure: 10 cc of viscous lidocaine were given orally to provide local anesthesia to the oropharynx. The patient was positioned supine on the left side, bite block provided. The patient was moderately sedated with the doses of versed and fentanyl as detailed below.  Using digital technique an omniplane probe was advanced into the esophagus without incident.   Moderate sedation: 1. Sedation used:  Versed: 3mg , Fentanyl: 50 mcg 2. Time administered:   1030  Time when patient started recovery: 1048 3. I was face to face during this time 18 mins  See report in EPIC  for complete details: In brief, imaging revealed severely reduced LV function, no mural apical thrombus noted.  Estimated ejection fraction was 20%.  Right sided chambers did not show any thrombus  Imaging of the septum showed no ASD or VSD Bubble study was negative for shunt 2D and color flow confirmed no PFO  The LA was well visualized in orthogonal views.  There was no spontaneous contrast and no thrombus in the LA and LA appendage   The descending thoracic aorta had no  mural aortic debris with no evidence of aneurysmal dilation or dissection  No evidence for cardiac thrombus   11/08/2021 10:52 AM

## 2021-11-09 ENCOUNTER — Ambulatory Visit: Payer: BC Managed Care – PPO | Admitting: Cardiology

## 2021-11-09 ENCOUNTER — Inpatient Hospital Stay: Payer: BC Managed Care – PPO

## 2021-11-09 ENCOUNTER — Encounter: Payer: Self-pay | Admitting: Cardiology

## 2021-11-09 DIAGNOSIS — I639 Cerebral infarction, unspecified: Secondary | ICD-10-CM

## 2021-11-09 DIAGNOSIS — E1121 Type 2 diabetes mellitus with diabetic nephropathy: Secondary | ICD-10-CM

## 2021-11-09 DIAGNOSIS — I420 Dilated cardiomyopathy: Secondary | ICD-10-CM

## 2021-11-09 DIAGNOSIS — Z794 Long term (current) use of insulin: Secondary | ICD-10-CM

## 2021-11-09 LAB — ANA W/REFLEX IF POSITIVE: Anti Nuclear Antibody (ANA): NEGATIVE

## 2021-11-09 LAB — GLUCOSE, CAPILLARY
Glucose-Capillary: 219 mg/dL — ABNORMAL HIGH (ref 70–99)
Glucose-Capillary: 238 mg/dL — ABNORMAL HIGH (ref 70–99)
Glucose-Capillary: 277 mg/dL — ABNORMAL HIGH (ref 70–99)

## 2021-11-09 LAB — HOMOCYSTEINE: Homocysteine: 20.5 umol/L — ABNORMAL HIGH (ref 0.0–14.5)

## 2021-11-09 MED ORDER — ATORVASTATIN CALCIUM 80 MG PO TABS: 80.0000 mg | ORAL_TABLET | Freq: Every day | ORAL | 0 refills | Status: DC

## 2021-11-09 NOTE — TOC Initial Note (Signed)
Transition of Care [facility]) - Initial/Assessment Note    Patient Details  Name: Robert Mccann MRN: [id number] Date of Birth: April 03, 1996  Transition of Care [facility]) CM/SW Contact:    Allayne Butcher, RN Phone Number: 11/09/2021, 5:47 PM  Clinical Narrative:                 Patient admitted to the hospital for stroke.  Plan for discharge home today.  Recommendation from therapy for OP OT and OP speech therapy.  Patient chooses [facility] OP Therapy.  Referral for OP Therapy faxed to [facility] OP department.  Patient is current with his PCP, he lives with his parents and they can provide transportation.     Expected Discharge Plan: OP Rehab Barriers to Discharge: Barriers Resolved   Patient Goals and CMS Choice Patient states their goals for this hospitalization and ongoing recovery are:: Patient glad to be going home today      Expected Discharge Plan and Services Expected Discharge Plan: OP Rehab   Discharge Planning Services: CM Consult   Living arrangements for the past 2 months: Single Family Home Expected Discharge Date: 11/09/21               DME Arranged: N/A DME Agency: NA       HH Arranged: NA HH Agency: NA        Prior Living Arrangements/Services Living arrangements for the past 2 months: Single Family Home Lives with:: Parents Patient language and need for interpreter reviewed:: Yes Do you feel safe going back to the place where you live?: Yes      Need for Family Participation in Patient Care: Yes (Comment) (stroke) Care giver support system in place?: Yes (comment) (parents)   Criminal Activity/Legal Involvement Pertinent to Current Situation/Hospitalization: No - Comment as needed  Activities of Daily Living Home Assistive Devices/Equipment: None ADL Screening (condition at time of admission) Patient's cognitive ability adequate to safely complete daily activities?: Yes Is the patient deaf or have difficulty hearing?: No Does the patient have difficulty  seeing, even when wearing glasses/contacts?: No Does the patient have difficulty concentrating, remembering, or making decisions?: No Patient able to express need for assistance with ADLs?: Yes Does the patient have difficulty dressing or bathing?: No Independently performs ADLs?: Yes (appropriate for developmental age) Does the patient have difficulty walking or climbing stairs?: No Weakness of Legs: None Weakness of Arms/Hands: None  Permission Sought/Granted Permission sought to share information with : Case Manager, Family Supports, Other (comment) Permission granted to share information with : Yes, Verbal Permission Granted  Share Information with NAME: Daven Montz  Permission granted to share info w AGENCY: [facility] OP Therapy  Permission granted to share info w Relationship: mother  Permission granted to share info w Contact Information: [phone]  Emotional Assessment Appearance:: Appears stated age Attitude/Demeanor/Rapport: Engaged Affect (typically observed): Accepting Orientation: : Oriented to Self, Oriented to Place, Oriented to  Time, Oriented to Situation Alcohol / Substance Use: Not Applicable Psych Involvement: No (comment)  Admission diagnosis:  CVA (cerebral vascular accident) (HCC) [I63.9] Cerebrovascular accident (CVA), unspecified mechanism (HCC) [I63.9] Patient Active Problem List   Diagnosis Date Noted   Cerebrovascular accident (CVA) (HCC) 11/05/2021   PE (pulmonary thromboembolism) (HCC) 06/28/2021   HFrEF (heart failure with reduced ejection fraction) (HCC)    Acute pulmonary embolism with acute cor pulmonale (HCC)    Obesity, Class III, BMI 40-49.9 (morbid obesity) (HCC)    DM II (diabetes mellitus, type II), controlled (HCC) 06/27/2021   Chest  pain 06/27/2021   HTN (hypertension) 06/27/2021   Malaise 05/25/2014   Routine history and physical examination of adult 05/25/2014   Obesity 05/25/2014   PCP:  Nira Retort Pharmacy:    [facility] [phone] Dan Humphreys, [place] - [address] AT [facility] OF [address] [facility] [place] [address] Phone: [phone] Fax: [phone]     Social Determinants of Health (SDOH) Interventions    Readmission Risk Interventions No flowsheet data found.

## 2021-11-09 NOTE — Progress Notes (Signed)
Occupational Therapy Treatment Patient Details Name: Robert Mccann MRN: [id number] DOB: 07/07/1996 Today's Date: 11/09/2021   History of present illness 25 y.o. male with medical history significant for hypertension, HFrEF with cardiomyopathy, type 2 diabetes mellitus and pulmonary embolism on Eliquis, who presented to the emergency room with acute onset of dysarthria, expressive dysphasia, left arm numbness, and blurred vision. CT head revealed bilateral cerebral infarcts including a moderate-sized acute to  subacute right temporoparietal infarct. MRI brain revealed large acute posterior right MCA territory infarct. EKG showed sinus tachycardia with rate of 105 with right atrial enlargement and LVH.  Noncontrasted CT scan performed on 11/05/21 revealed bilateral cerebral infarcts including a moderate-sized acute to subacute right temporoparietal infarct with no acute intracranial hemorrhage.   OT comments  Robert Mccann was seen for OT treatment on this date. Upon arrival to room pt awake/alert. Pt in bed with family present. Pt agreeable to tx. Pt instructed in stroke education and scanning activities for HEP. Pt verbalized understanding. Completed visual scanning tasks in preparation for returning to IADLs. Pt completed 8 lines of a linear number scan, searching for double digits with 6/8 lines correctly scanned. Pt completed figure ground exercise, requiring VCs. Pt making good progress toward goals. Pt continues to benefit from skilled OT services to maximize return to PLOF and minimize risk of future falls, injury, caregiver burden, and readmission. Will continue to follow POC. Discharge recommendation remains appropriate.     Recommendations for follow up therapy are one component of a multi-disciplinary discharge planning process, led by the attending physician.  Recommendations may be updated based on patient status, additional functional criteria and insurance authorization.    Follow Up  Recommendations  Outpatient OT    Assistance Recommended at Discharge PRN  Equipment Recommendations  None recommended by OT    Recommendations for Other Services      Precautions / Restrictions Precautions Precautions: Fall Restrictions Weight Bearing Restrictions: No       Mobility Bed Mobility Overal bed mobility: Independent             General bed mobility comments: Pt situatated self on EOB to complete visual scanning tasks.    Transfers                         Balance                                           ADL either performed or assessed with clinical judgement   ADL Overall ADL's : Modified independent                                       General ADL Comments: Completed visual scanning tasks in preparation for returning to IADLs such as driving and work. Pt completed 8 lines of a linear number scan, searching for double digits with 6/8 lines correctly scanned. Pt completed figure ground exercise, requiring VCs.    Extremity/Trunk Assessment               Cognition Arousal/Alertness: Awake/alert Behavior During Therapy: WFL for tasks assessed/performed Overall Cognitive Status: Within Functional Limits for tasks assessed  Exercises Other Exercises Other Exercises: Pt educ re: role of OT, stroke education Other Exercises: Pt sup>sit, linear scanning task, figure-ground task   Shoulder Instructions       General Comments      Pertinent Vitals/ Pain       Pain Assessment: No/denies pain  Home Living                                          Prior Functioning/Environment              Frequency  Min 3X/week        Progress Toward Goals  OT Goals(current goals can now be found in the care plan section)  Progress towards OT goals: Progressing toward goals  Acute Rehab OT Goals Patient Stated Goal: to  return to work/driving OT Goal Formulation: With patient Time For Goal Achievement: 11/20/21 Potential to Achieve Goals: Good ADL Goals Pt Will Perform Grooming: Independently;standing Pt/caregiver will Perform Home Exercise Program: Left upper extremity;Independently;With written HEP provided Additional ADL Goal #1: Pt will independently initiate x2 visual compensatory strategies during standing IADL task  Plan Discharge plan remains appropriate;Frequency remains appropriate    Co-evaluation                 AM-PAC OT "6 Clicks" Daily Activity     Outcome Measure   Help from another person eating meals?: None Help from another person taking care of personal grooming?: A Little Help from another person toileting, which includes using toliet, bedpan, or urinal?: None Help from another person bathing (including washing, rinsing, drying)?: None Help from another person to put on and taking off regular upper body clothing?: None Help from another person to put on and taking off regular lower body clothing?: None 6 Click Score: 23    End of Session    OT Visit Diagnosis: Other symptoms and signs involving the nervous system (R29.898)   Activity Tolerance Patient tolerated treatment well   Patient Left in bed;with family/visitor present   Nurse Communication          Time: 9147-8295 OT Time Calculation (min): 23 min  Charges: OT General Charges $OT Visit: 1 Visit OT Treatments $Therapeutic Activity: 23-37 mins  [place] [employer], Adine Madura 11/09/2021, 10:46 AM

## 2021-11-09 NOTE — Progress Notes (Addendum)
SLP Cancellation Note  Patient Details Name: Robert Mccann MRN: [id number] DOB: July 22, 1996   Cancelled treatment:       Reason Eval/Treat Not Completed:  SLP session deferred at this time as pt working with OT. Will continue efforts at appropriate.     Clyde Canterbury, M.S., CCC-SLP Speech-Language Pathologist [facility] [phone] Arnette Felts)   Woodroe Chen 11/09/2021, 10:34 AM

## 2021-11-09 NOTE — Progress Notes (Signed)
Speech Language Pathology Treatment:    Patient Details Name: Robert Mccann MRN: [id number] DOB: 1996-12-23 Today's Date: 11/09/2021 Time: 1040-1100 SLP Time Calculation (min) (ACUTE ONLY): 20 min  Assessment / Plan / Recommendation Clinical Impression  Pt seen for diagnostic tx targeting functional reading/writing, verbal expression, and higher level cognitive-linguistic ability. Upon SLP entrance to room, pt seated upright on EOB. Girlfriend present. Pt and girlfriend both endorse improvement in pt's verbal expression; however, note he is still not back to baseline. Girlfriend noted with with "five or six" times this AM where pt "searched for his words." Girlfriend described independent repair of communication breakdowns.   Pt participated in Clockdrawing Task. Visuospatial deficits are apparent. Pt able to ID errors (e.g. incomplete circle, incorrect spacing of #s); however, unable to amend without assistance. Reading comprehension for directions for Clockdrawing appeared [facility]. Pt endorsed some difficulty with reading on social media since stroke. Further assessment warranted. Pt participated in functional writing task at the paragraph level. X2 paraphasic errors noted with writing. Pt unable to ID errors, but was able to repair once identified by SLP. Pt benefited in timed divergent naming task in which pt named 16 animals in 60s. Decreased organization of responses noted. Pt's speech in conversational exchanges was observed to be fluent and without s/sx anomia.  Based on today's tx session and consultation with pt/girlfirend, pt continues to present with cognitive-linguistic deficits affecting complex expression, visual attention, and higher level problem solving/executive functioning.  Pt educated pt and girlfriend re: progress to date, d/c recommendations, strategies for improved functional communication (including writing, speaking), and SLP POC.   SLP to f/u per POC for ongoing  diagnostic tx to address cognitive-linguistic deficits. Anticipate need for some level of assistance and post-acute SLP services at d/c.    HPI HPI: Per H&P "Robert Mccann is a 25 y.o. African-American male with medical history significant for hypertension, HFrEF with cardiomyopathy, type 2 diabetes mellitus and pulmonary embolism on Eliquis, who presented to the emergency room with acute onset of dysarthria and expressive dysphasia as well as left arm numbness and mild weakness for the last 2 to 3 days with a feeling of left leg warmth without numbness or weakness.  He thought he had mild unsteady gait but was able to ambulate.  He admitted to right-sided headache for a couple of days.  He admitted to blurred vision without dizziness and denied presyncope or syncope.  No witnessed seizures.  He stated that he had a problem with distant calculation.  He denied any chest pain or palpitations.  No fever or chills.  No cough or wheezing or dyspnea.  No urinary or stool incontinence.  No tinnitus or vertigo.  No dysuria, oliguria or hematuria or flank pain."      SLP Plan  Continue with current plan of care      Recommendations for follow up therapy are one component of a multi-disciplinary discharge planning process, led by the attending physician.  Recommendations may be updated based on patient status, additional functional criteria and insurance authorization.       Follow Up Recommendations: Outpatient SLP Assistance recommended at discharge: PRN SLP Visit Diagnosis: Cognitive communication deficit (V78.588) Plan: Continue with current plan of care       Clyde Canterbury, M.S., CCC-SLP Speech-Language Pathologist [facility] [phone] Robert Mccann)                 Robert Mccann  11/09/2021, 11:42 AM

## 2021-11-09 NOTE — Discharge Instructions (Signed)
As we discussed you have visual neglect from your stroke, which means your brain doesn't "notice" what it sees on the left side of either eye. This may get better over time, but for now it is important that you do not drive until you are cleared by outpatient neurology. They may decide to order a functional evaluation with occupational therapy to help decide this - they will let you know when they see you in clinic.

## 2021-11-09 NOTE — Discharge Summary (Signed)
Story White [id number] DOB: 1996-05-25 DOA: 11/05/2021  PCP: Ellene Route  Admit date: 11/05/2021 Discharge date: 11/10/2021  Admitted From: Home Disposition: Home  Recommendations for Outpatient Follow-up:  Follow up with PCP in 1 week Please obtain BMP/CBC in one week Please follow up hematology in 1 week Follow-up with neurology in 1 week Follow-up with cardiology in 1 week    Discharge Condition:Stable CODE STATUS: Full Diet recommendation: Heart Healthy / Carb Modified Brief/Interim Summary: Per [id number] Robert Mccann is a 25 y.o. African-American male with medical history significant for hypertension, HFrEF with cardiomyopathy, type 2 diabetes mellitus and pulmonary embolism on Eliquis, who presented to the emergency room with acute onset of dysarthria, HA, blurry vision and expressive dysphasia as well as left arm numbness and mild weakness for the last 2 to 3 days with a feeling of left leg warmth without numbness or weakness.  MRI of the brain showed a large right MCA stroke. Neurology was consulted.   Acute right MCA large stroke. Hypertension urgency CT angiogram of the neck and head did not show significant occlusion. TTE showed ejection fraction less than 20%, no identified left ventricular thrombus. Due to large stroke with high risk for hemorrhagic conversion, Eliquis was on hold initially. Was placed on asa and lipitor.  On discharge after repeat CT was found to be stable , eliquis was resumed, and ASA discontinued for now and will need re-evaluation as outpatient to see if can start it. Cardiology has suggested addition of aspirin since he had a stroke on eliquis; neurology recommended considering this at outpatient neurology f/u given small amount of petechial hemorrhage on head CT TEE no ASD/VSD, no PFO. No thrombus Venous US no dvt.  He has been given strict return precautions to call 911 for severe headache or any change in neurologic sx No  driving until cleared by outpatient neurology given L visual neglect. He may require outpatient OT functional evaluation for this.   Hx/o PE Thombectomy 2022.  On eliquis Will need to f/u with hematology as outpatient for hypercoaguable w/u    Chronic systolic congestive heart failure  ejection fraction less than 20%. no acute exacerbation Cotninue outpatient meds Consider starting jardiance /Farxiga as outpatient per cardiology Follow-up with cardiology as outpatient     Uncontrolled type 2 diabetes with hyperglycemia. Hemoglobin A1c 8.7 Resume home meds F/u with pcp for further evaluation   Morbid obesity. Complicates overall care and prognosis  Hypokalemia. Hyponatremia Replaced and stable.   Discharge Diagnoses:  Active Problems:   DM II (diabetes mellitus, type II), controlled ([place])   HFrEF (heart failure with reduced ejection fraction) (H[facility])   PE (pulmonary thromboembolism) (HCC)   Obesity, Class III, BMI 40-49.9 (morbid obesity) ([place])   Cerebrovascular accident (CVA) [facility])    Discharge Instructions  Discharge Instructions     Call MD for:  persistant dizziness or light-headedness   Complete by: As directed    Diet - low sodium heart healthy   Complete by: As directed    Diet Carb Modified   Complete by: As directed    Discharge instructions   Complete by: As directed    cannot drive until cleared by outpatient neurology Do not take aspirin until you are seen as outpatient Need to f/u with cardiology, neurology , and hematology, and pcp   Increase activity slowly   Complete by: As directed       Allergies as of 11/09/2021   No Known Allergies      Medication List  STOP taking these medications    furosemide 20 MG tablet Commonly known as: LASIX       TAKE these medications    apixaban 5 MG Tabs tablet Commonly known as: ELIQUIS Take 5 mg by mouth 2 (two) times daily.   atorvastatin 80 MG tablet Commonly known as: LIPITOR Take  1 tablet (80 mg total) by mouth daily.   Biotin 1000 MCG tablet Take 1 tablet (1 mg total) by mouth daily. Chew 1 gummy daily.   carvedilol 6.25 MG tablet Commonly known as: COREG Take 1 tablet (6.25 mg total) by mouth 2 (two) times daily with a meal.   Farxiga 10 MG Tabs tablet Generic drug: dapagliflozin propanediol Take 10 mg by mouth daily.   metFORMIN 500 MG tablet Commonly known as: GLUCOPHAGE Take 1 tablet (500 mg total) by mouth 2 (two) times daily.   ONE-A-DAY MENS PO Take 1 tablet by mouth daily.   sacubitril-valsartan 24-26 MG Commonly known as: ENTRESTO Take 1 tablet by mouth 2 (two) times daily.   spironolactone 25 MG tablet Commonly known as: ALDACTONE Take 0.5 tablets (12.5 mg total) by mouth daily.   torsemide 20 MG tablet Commonly known as: DEMADEX Take 2 tablets (40 mg total) by mouth 2 (two) times daily.        Follow-up Information     Lonell Face, MD Follow up in 1 week(s).   Specialty: Neurology Contact information: [phone] [facility] [address] [facility] [place] [address] [phone]         Earna Coder, MD Follow up in 1 week(s).   Specialties: Internal Medicine, Oncology Why: f/u from hospital, needs hypercoaguble w/u. drawn in the hospital Contact information: [address] [place] [address] [phone]         Nira Retort Follow up in 1 week(s).   Contact information: [address] [place] [address] [phone]         Yvonne Kendall, MD Follow up in 1 week(s).   Specialty: Cardiology Why: hospital f/u Contact information: [address] [place] [address] [phone]                No Known Allergies  Consultations: Cardiology, neurology   Procedures/Studies: CT ANGIO HEAD NECK W WO CM  Result Date: 11/06/2021 CLINICAL DATA:  Neuro deficit, acute, stroke suspected EXAM: CT ANGIOGRAPHY HEAD AND NECK TECHNIQUE:  Multidetector CT imaging of the head and neck was performed using the standard protocol during bolus administration of intravenous contrast. Multiplanar CT image reconstructions and MIPs were obtained to evaluate the vascular anatomy. Carotid stenosis measurements (when applicable) are obtained utilizing NASCET criteria, using the distal internal carotid diameter as the denominator. CONTRAST:  21mL OMNIPAQUE IOHEXOL 350 MG/ML SOLN COMPARISON:  CT head 11/05/2021 and MRI head 11/05/2021 FINDINGS: CT HEAD FINDINGS Brain: Redemonstrated moderate size region of hypoattenuation involving the cortex and white matter of the right temporoparietal region, extending into the anterior right temporal lobe and right occipital lobes, consistent with the acute infarct seen on the 11/05/2021 CT and MRI. Additional hypodensity in the left medial parietal lobe, also unchanged. No definite acute hemorrhage. No mass, mass effect, or midline shift. No hydrocephalus or extra-axial collection. Vascular: See CTA findings below. Skull: Normal. Negative for fracture or focal lesion. Sinuses: Imaged portions are clear. Orbits: No acute finding. Review of the MIP images confirms the above findings CTA NECK FINDINGS Aortic arch: 2 vessel arch with a common origin of  the brachiocephalic and left common carotid arteries. Imaged portion shows no evidence of aneurysm or dissection. No significant stenosis of the major arch vessel origins. Right carotid system: No evidence of dissection, stenosis (50% or greater) or occlusion. Left carotid system: No evidence of dissection, stenosis (50% or greater) or occlusion. Vertebral arteries: Left dominant, with diminutive right vertebral artery. No evidence of dissection, stenosis (50% or greater) or occlusion. Skeleton: No acute osseous abnormality. Other neck: Negative. Upper chest: Negative. Review of the MIP images confirms the above findings CTA HEAD FINDINGS Anterior circulation: Both internal carotid  arteries are patent to the termini, with mild narrowing of the left supraclinoid ICA (series 10, image 256). A1 segments patent. Anterior communicating artery is not definitively visualized. The left A2 is hypoplastic and is possibly occluded distally (series 10, images 268, 272, 274, 278). The left A2 and distal ACA segments are patent and well perfused. No M1 stenosis or occlusion. Normal MCA bifurcations. Right MCA branches demonstrate multifocal narrowing and somewhat diminished opacification (series 10, image is 275), possibly with collateralization, although there appears to be perfusion distally. Left distal MCA branches appear perfused. Posterior circulation: Left dominant system, with a diminutive right vertebral artery. Vertebral arteries patent to the vertebrobasilar junction without stenosis. Posterior inferior cerebral arteries patent bilaterally. Basilar patent to its distal aspect. Superior cerebral arteries patent bilaterally. PCAs well perfused to their distal aspects without stenosis. Diminutive posterior communicating arteries visualized bilaterally. Venous sinuses: As permitted by contrast timing, patent. Anatomic variants: None significant Review of the MIP images confirms the above findings IMPRESSION: 1. Redemonstrated right posterior PCA territory infarcts, without evidence of hemorrhagic conversion. 2. Diminutive right M2 and M3 branches, with diminished opacification and likely multifocal narrowing. 3. Hypoplastic left A2, which is possibly occluded distally. 4. No hemodynamically significant stenosis in the neck. Electronically Signed   By: Merilyn Baba M.D.   On: 11/06/2021 14:38   CT HEAD WO CONTRAST (5MM)  Result Date: 11/09/2021 CLINICAL DATA:  Stroke, follow-up. EXAM: CT HEAD WITHOUT CONTRAST TECHNIQUE: Contiguous axial images were obtained from the base of the skull through the vertex without intravenous contrast. COMPARISON:  11/09/2019 FINDINGS: Brain: No focal abnormality  affects the brainstem or cerebellum. Left cerebral hemisphere shows old infarction in the medial posterior parietal lobe. On the right, acute/subacute infarction at the right temporoparietal junction and within the right parietal lobe has not extended. No visible blood products in this region presently. Old infarction in the medial right posterior parietal and occipital region as seen previously. No new area of infarction is seen. No hydrocephalus or extra-axial collection. Vascular: No abnormal vascular finding. Skull: Negative Sinuses/Orbits: Clear/normal Other: None IMPRESSION: Acute/subacute infarction affecting the right posterior temporal lobe and temporoparietal junction. No blood products evident by CT today. No evidence of extension or increased swelling. Old infarctions in the posteromedial parietal lobes and right occipital lobe are unchanged. Electronically Signed   By: Nelson Chimes M.D.   On: 11/09/2021 15:07   CT HEAD WO CONTRAST (5MM)  Result Date: 11/08/2021 CLINICAL DATA:  Stroke, rule out interval hemorrhagic conversion before restarting anticoagulation EXAM: CT HEAD WITHOUT CONTRAST TECHNIQUE: Contiguous axial images were obtained from the base of the skull through the vertex without intravenous contrast. COMPARISON:  11/06/2021 FINDINGS: Brain: Redemonstrated moderately sized region of hypoattenuation involving the cortex and white matter of the right temporoparietal region, extending in the anterior right temporal lobe and right occipital lobes, consistent with infarct. Linear areas of hyperdensity within this region (series 2, image  13), likely reflecting petechial hemorrhage, without evidence of hemorrhagic conversion. No new area of infarction. No mass, mass effect, or midline shift. No extra-axial collection or hydrocephalus. Vascular: No hyperdense vessel. Skull: Normal. Negative for fracture or focal lesion. Sinuses/Orbits: No acute finding. Other: The mastoids are well aerated.  IMPRESSION: Redemonstrated posterior right MCA territory stroke, with some areas of petechial hemorrhage but no evidence of hemorrhagic conversion. Electronically Signed   By: Merilyn Baba M.D.   On: 11/08/2021 15:57   CT Head Wo Contrast  Result Date: 11/05/2021 CLINICAL DATA:  Mental status change, unknown cause. Stroke-like symptoms. Speech disturbance. Left-sided numbness. EXAM: CT HEAD WITHOUT CONTRAST TECHNIQUE: Contiguous axial images were obtained from the base of the skull through the vertex without intravenous contrast. COMPARISON:  None. FINDINGS: Brain: A moderate-sized region of hypoattenuation involving cortex and white matter in the temporoparietal region is consistent with an acute to subacute infarct. Additional smaller infarcts are present in the right greater than left occipital lobes and have a generally older appearance than the temporoparietal infarct. Hypodensity in the inferior left temporal lobe is favored to reflect artifact based on sagittal reformats with edema/infarct considered less likely. No acute intracranial hemorrhage, midline shift, or extra-axial fluid collection is identified. Vascular: No hyperdense vessel. Skull: No fracture or suspicious osseous lesion. Sinuses/Orbits: Visualized paranasal sinuses and mastoid air cells are clear. Unremarkable orbits. Other: None. IMPRESSION: 1. Bilateral cerebral infarcts including a moderate-sized acute to subacute right temporoparietal infarct. 2. No acute intracranial hemorrhage. Electronically Signed   By: Logan Bores M.D.   On: 11/05/2021 18:00   MR BRAIN WO CONTRAST  Result Date: 11/05/2021 CLINICAL DATA:  Stroke follow-up EXAM: MRI HEAD WITHOUT CONTRAST TECHNIQUE: Multiplanar, multiecho pulse sequences of the brain and surrounding structures were obtained without intravenous contrast. COMPARISON:  None. FINDINGS: Brain: There is a large acute infarct of the posterior right MCA territory. No acute ischemia in any other  vascular territory. There is moderate edema at the infarct site. No acute or chronic hemorrhage. The midline structures are normal. Vascular: Major flow voids are preserved. Skull and upper cervical spine: Normal calvarium and skull base. Visualized upper cervical spine and soft tissues are normal. Sinuses/Orbits:No paranasal sinus fluid levels or advanced mucosal thickening. No mastoid or middle ear effusion. Normal orbits. IMPRESSION: Large acute posterior right MCA territory infarct without hemorrhage or mass effect. Electronically Signed   By: Ulyses Jarred M.D.   On: 11/05/2021 22:29   US Venous Img Lower Bilateral (DVT)  Result Date: 11/07/2021 CLINICAL DATA:  Lower extremity edema, stroke and history of prior pulmonary embolism. EXAM: BILATERAL LOWER EXTREMITY VENOUS DOPPLER ULTRASOUND TECHNIQUE: Gray-scale sonography with graded compression, as well as color Doppler and duplex ultrasound were performed to evaluate the lower extremity deep venous systems from the level of the common femoral vein and including the common femoral, femoral, profunda femoral, popliteal and calf veins including the posterior tibial, peroneal and gastrocnemius veins when visible. The superficial great saphenous vein was also interrogated. Spectral Doppler was utilized to evaluate flow at rest and with distal augmentation maneuvers in the common femoral, femoral and popliteal veins. COMPARISON:  06/29/2021 FINDINGS: RIGHT LOWER EXTREMITY Common Femoral Vein: No evidence of thrombus. Normal compressibility, respiratory phasicity and response to augmentation. Saphenofemoral Junction: No evidence of thrombus. Normal compressibility and flow on color Doppler imaging. Profunda Femoral Vein: No evidence of thrombus. Normal compressibility and flow on color Doppler imaging. Femoral Vein: No evidence of thrombus. Normal compressibility, respiratory phasicity and response to  augmentation. Popliteal Vein: No evidence of thrombus. Normal  compressibility, respiratory phasicity and response to augmentation. Calf Veins: No evidence of thrombus. Normal compressibility and flow on color Doppler imaging. Superficial Great Saphenous Vein: No evidence of thrombus. Normal compressibility. Venous Reflux:  None. Other Findings: No evidence of superficial thrombophlebitis or abnormal fluid collection. LEFT LOWER EXTREMITY Common Femoral Vein: No evidence of thrombus. Normal compressibility, respiratory phasicity and response to augmentation. Saphenofemoral Junction: No evidence of thrombus. Normal compressibility and flow on color Doppler imaging. Profunda Femoral Vein: No evidence of thrombus. Normal compressibility and flow on color Doppler imaging. Femoral Vein: No evidence of thrombus. Normal compressibility, respiratory phasicity and response to augmentation. Popliteal Vein: No evidence of thrombus. Normal compressibility, respiratory phasicity and response to augmentation. Calf Veins: No evidence of thrombus. Normal compressibility and flow on color Doppler imaging. Superficial Great Saphenous Vein: No evidence of thrombus. Normal compressibility. Venous Reflux:  None. Other Findings: No evidence of superficial thrombophlebitis or abnormal fluid collection. IMPRESSION: No evidence of deep venous thrombosis in either lower extremity. Electronically Signed   By: Aletta Edouard M.D.   On: 11/07/2021 09:12   ECHOCARDIOGRAM COMPLETE  Result Date: 11/06/2021    ECHOCARDIOGRAM REPORT   Patient Name:   Robert Mccann Date of Exam: 11/06/2021 Medical Rec #:  [id number]        Height:       72.0 in Accession #:    [id number]       Weight:       304.5 lb Date of Birth:  1996-01-11       BSA:          2.547 m Patient Age:    25 years         BP:           104/66 mmHg Patient Gender: M                HR:           79 bpm. Exam Location:  [facility] Procedure: 2D Echo, Cardiac Doppler and Color Doppler Indications:     Stroke I63.9  History:         Patient has prior  history of Echocardiogram examinations, most                  recent 06/28/2021. Cardiomyopathy; Risk Factors:Hypertension.                  Pulmonary embolism.  Sonographer:     Sherrie Sport Referring Phys:  [id number] JAN A MANSY Diagnosing Phys: Kate Sable MD  Sonographer Comments: Suboptimal apical window. IMPRESSIONS  1. Left ventricular ejection fraction, by estimation, is <20%. The left ventricle has severely decreased function. The left ventricle demonstrates global hypokinesis. The left ventricular internal cavity size was moderately dilated. There is mild left ventricular hypertrophy. Left ventricular diastolic parameters are consistent with Grade II diastolic dysfunction (pseudonormalization).  2. Right ventricular systolic function is moderately reduced. The right ventricular size is not well visualized.  3. The mitral valve is normal in structure. No evidence of mitral valve regurgitation.  4. The aortic valve is tricuspid. Aortic valve regurgitation is not visualized. FINDINGS  Left Ventricle: Left ventricular ejection fraction, by estimation, is <20%. The left ventricle has severely decreased function. The left ventricle demonstrates global hypokinesis. The left ventricular internal cavity size was moderately dilated. There is mild left ventricular hypertrophy. Left ventricular diastolic parameters are consistent with Grade II diastolic dysfunction (pseudonormalization). Right Ventricle: The right  ventricular size is not well visualized. No increase in right ventricular wall thickness. Right ventricular systolic function is moderately reduced. Left Atrium: Left atrial size was normal in size. Right Atrium: Right atrial size was normal in size. Pericardium: There is no evidence of pericardial effusion. Mitral Valve: The mitral valve is normal in structure. No evidence of mitral valve regurgitation. MV peak gradient, 3.4 mmHg. The mean mitral valve gradient is 1.0 mmHg. Tricuspid Valve: The tricuspid  valve is normal in structure. Tricuspid valve regurgitation is not demonstrated. Aortic Valve: The aortic valve is tricuspid. Aortic valve regurgitation is not visualized. Aortic valve mean gradient measures 2.0 mmHg. Aortic valve peak gradient measures 3.5 mmHg. Aortic valve area, by VTI measures 2.61 cm. Pulmonic Valve: The pulmonic valve was normal in structure. Pulmonic valve regurgitation is not visualized. Aorta: The aortic root is normal in size and structure. Venous: The inferior vena cava was not well visualized. IAS/Shunts: No atrial level shunt detected by color flow Doppler.  LEFT VENTRICLE PLAX 2D LVIDd:         6.64 cm      Diastology LVIDs:         6.19 cm      LV e' medial:    4.35 cm/s LV PW:         1.74 cm      LV E/e' medial:  10.7 LV IVS:        1.01 cm      LV e' lateral:   6.20 cm/s LVOT diam:     2.30 cm      LV E/e' lateral: 7.5 LV SV:         38 LV SV Index:   15 LVOT Area:     4.15 cm  LV Volumes (MOD) LV vol d, MOD A2C: 205.0 ml LV vol d, MOD A4C: 259.0 ml LV vol s, MOD A2C: 177.0 ml LV vol s, MOD A4C: 197.0 ml LV SV MOD A2C:     28.0 ml LV SV MOD A4C:     259.0 ml LV SV MOD BP:      60.1 ml RIGHT VENTRICLE RV Basal diam:  4.84 cm RV S prime:     7.94 cm/s TAPSE (M-mode): 3.7 cm LEFT ATRIUM             Index        RIGHT ATRIUM           Index LA diam:        3.60 cm 1.41 cm/m   RA Area:     19.90 cm LA Vol (A2C):   60.2 ml 23.63 ml/m  RA Volume:   59.50 ml  23.36 ml/m LA Vol (A4C):   35.8 ml 14.05 ml/m LA Biplane Vol: 47.5 ml 18.65 ml/m  AORTIC VALVE                    PULMONIC VALVE AV Area (Vmax):    2.44 cm     PV Vmax:        0.49 m/s AV Area (Vmean):   2.41 cm     PV Vmean:       32.400 cm/s AV Area (VTI):     2.61 cm     PV VTI:         0.079 m AV Vmax:           93.30 cm/s   PV Peak grad:   0.9 mmHg AV Vmean:  74.900 cm/s  PV Mean grad:   0.5 mmHg AV VTI:            0.144 m      RVOT Peak grad: 5 mmHg AV Peak Grad:      3.5 mmHg AV Mean Grad:      2.0 mmHg LVOT  Vmax:         54.80 cm/s LVOT Vmean:        43.400 cm/s LVOT VTI:          0.090 m LVOT/AV VTI ratio: 0.63  AORTA Ao Root diam: 3.07 cm MITRAL VALVE               TRICUSPID VALVE MV Area (PHT): 6.37 cm    TR Peak grad:   11.3 mmHg MV Area VTI:   2.74 cm    TR Vmax:        168.00 cm/s MV Peak grad:  3.4 mmHg MV Mean grad:  1.0 mmHg    SHUNTS MV Vmax:       0.92 m/s    Systemic VTI:  0.09 m MV Vmean:      46.5 cm/s   Systemic Diam: 2.30 cm MV Decel Time: 119 msec    Pulmonic VTI:  0.165 m MV E velocity: 46.70 cm/s MV A velocity: 45.40 cm/s MV E/A ratio:  1.03 Kate Sable MD Electronically signed by Kate Sable MD Signature Date/Time: 11/06/2021/1:26:57 PM    Final    ECHO TEE  Result Date: 11/08/2021    TRANSESOPHOGEAL ECHO REPORT   Patient Name:   Robert Mccann Date of Exam: 11/08/2021 Medical Rec #:  [id number]        Height:       72.0 in Accession #:    [id number]       Weight:       304.5 lb Date of Birth:  08/13/1996       BSA:          2.547 m Patient Age:    25 years         BP:           103/77 mmHg Patient Gender: M                HR:           125 bpm. Exam Location:  [facility] Procedure: Transesophageal Echo, Color Doppler, Cardiac Doppler and Saline            Contrast Bubble Study Indications:     I63.9 Cerebral Infarction, unspecified  History:         Patient has prior history of Echocardiogram examinations, most                  recent 11/06/2021. CHF; Stroke.  Sonographer:     Charmayne Sheer Referring Phys:  [id number] Rise Mu Diagnosing Phys: Kate Sable MD PROCEDURE: The transesophogeal probe was passed without difficulty through the esophogus of the patient. Sedation performed by performing physician. Patients was under conscious sedation during this procedure. Anesthetic administered: 6mcg of Fentanyl, 3mg  of Versed. The patient developed no complications during the procedure. IMPRESSIONS  1. Left ventricular ejection fraction, by estimation, is 20 to 25%. The left ventricle has  severely decreased function. The left ventricle demonstrates global hypokinesis. The left ventricular internal cavity size was moderately dilated.  2. Right ventricular systolic function is severely reduced. The right ventricular size is normal.  3. No left atrial/left atrial appendage thrombus was  detected.  4. The mitral valve is normal in structure. No evidence of mitral valve regurgitation.  5. The aortic valve is tricuspid. Aortic valve regurgitation is not visualized.  6. Agitated saline contrast bubble study was negative, with no evidence of any interatrial shunt. Conclusion(s)/Recommendation(s): No LA/LAA thrombus identified. Negative bubble study for interatrial shunt. No intracardiac source of embolism detected on this on this transesophageal echocardiogram. FINDINGS  Left Ventricle: There is evidence for non-campaction. Left ventricular ejection fraction, by estimation, is 20 to 25%. The left ventricle has severely decreased function. The left ventricle demonstrates global hypokinesis. The left ventricular internal cavity size was moderately dilated. Right Ventricle: The right ventricular size is normal. No increase in right ventricular wall thickness. Right ventricular systolic function is severely reduced. Left Atrium: Left atrial size was normal in size. No left atrial/left atrial appendage thrombus was detected. Right Atrium: Right atrial size was normal in size. Pericardium: There is no evidence of pericardial effusion. Mitral Valve: The mitral valve is normal in structure. No evidence of mitral valve regurgitation. Tricuspid Valve: The tricuspid valve is normal in structure. Tricuspid valve regurgitation is not demonstrated. Aortic Valve: The aortic valve is tricuspid. Aortic valve regurgitation is not visualized. Pulmonic Valve: The pulmonic valve was normal in structure. Pulmonic valve regurgitation is not visualized. Aorta: The aortic root is normal in size and structure. IAS/Shunts: No atrial  level shunt detected by color flow Doppler. Agitated saline contrast was given intravenously to evaluate for intracardiac shunting. Agitated saline contrast bubble study was negative, with no evidence of any interatrial shunt. Kate Sable MD Electronically signed by Kate Sable MD Signature Date/Time: 11/08/2021/2:35:06 PM    Final       Subjective: No sob, cp, dizziness  Discharge Exam: Vitals:   11/09/21 0745 11/09/21 1228  BP: 96/66 (!) 98/59  Pulse: 79 84  Resp: 18 18  Temp: 98.6 F (37 C) 98.1 F (36.7 C)  SpO2: 100% 100%   Vitals:   11/08/21 2342 11/09/21 0431 11/09/21 0745 11/09/21 1228  BP: (!) 95/54 (!) 92/58 96/66 (!) 98/59  Pulse: 88 72 79 84  Resp: 16 18 18 18   Temp: 97.8 F (36.6 C) 97.8 F (36.6 C) 98.6 F (37 C) 98.1 F (36.7 C)  TempSrc: Oral   Oral  SpO2: 100% 95% 100% 100%  Weight:      Height:        General: Pt is alert, awake, not in acute distress Cardiovascular: RRR, S1/S2 +, no rubs, no gallops Respiratory: CTA bilaterally, no wheezing, no rhonchi Abdominal: Soft, NT, ND, bowel sounds + Extremities: no edema, no cyanosis    The results of significant diagnostics from this hospitalization (including imaging, microbiology, ancillary and laboratory) are listed below for reference.     Microbiology: Recent Results (from the past 240 hour(s))  Resp Panel by RT-PCR (Flu A&B, Covid) Nasopharyngeal Swab     Status: None   Collection Time: 11/05/21  5:34 PM   Specimen: Nasopharyngeal Swab; Nasopharyngeal(NP) swabs in vial transport medium  Result Value Ref Range Status   SARS Coronavirus 2 by RT PCR NEGATIVE NEGATIVE Final    Comment: (NOTE) SARS-CoV-2 target nucleic acids are NOT DETECTED.  The SARS-CoV-2 RNA is generally detectable in upper respiratory specimens during the acute phase of infection. The lowest concentration of SARS-CoV-2 viral copies this assay can detect is 138 copies/mL. A negative result does not preclude  SARS-Cov-2 infection and should not be used as the sole basis for treatment or other  patient management decisions. A negative result may occur with  improper specimen collection/handling, submission of specimen other than nasopharyngeal swab, presence of viral mutation(s) within the areas targeted by this assay, and inadequate number of viral copies(<138 copies/mL). A negative result must be combined with clinical observations, patient history, and epidemiological information. The expected result is Negative.  Fact Sheet for Patients:  EntrepreneurPulse.com.au  Fact Sheet for Healthcare Providers:  IncredibleEmployment.be  This test is no t yet approved or cleared by the [place] FDA and  has been authorized for detection and/or diagnosis of SARS-CoV-2 by FDA under an Emergency Use Authorization (EUA). This EUA will remain  in effect (meaning this test can be used) for the duration of the COVID-19 declaration under Section 564(b)(1) of the Act, 21 U.S.C.section 360bbb-3(b)(1), unless the authorization is terminated  or revoked sooner.       Influenza A by PCR NEGATIVE NEGATIVE Final   Influenza B by PCR NEGATIVE NEGATIVE Final    Comment: (NOTE) The Xpert Xpress SARS-CoV-2/FLU/RSV plus assay is intended as an aid in the diagnosis of influenza from Nasopharyngeal swab specimens and should not be used as a sole basis for treatment. Nasal washings and aspirates are unacceptable for Xpert Xpress SARS-CoV-2/FLU/RSV testing.  Fact Sheet for Patients: EntrepreneurPulse.com.au  Fact Sheet for Healthcare Providers: IncredibleEmployment.be  This test is not yet approved or cleared by the [place] FDA and has been authorized for detection and/or diagnosis of SARS-CoV-2 by FDA under an Emergency Use Authorization (EUA). This EUA will remain in effect (meaning this test can be used) for the duration of  the COVID-19 declaration under Section 564(b)(1) of the Act, 21 U.S.C. section 360bbb-3(b)(1), unless the authorization is terminated or revoked.  Performed at [facility], [address] [place], [place] [address]      Labs: BNP (last 3 results) Recent Labs    06/27/21 1855 09/06/21 1157  BNP 740.6* 0000000*   Basic Metabolic Panel: Recent Labs  Lab 11/05/21 1734 11/06/21 0417 11/07/21 0623 11/08/21 0511  NA 133* 136 132* 133*  K 3.4* 3.1* 3.3* 3.5  CL 92* 98 92* 92*  CO2 29 29 28 31   GLUCOSE 261* 214* 209* 232*  BUN 18 15 17  22*  CREATININE 1.14 0.89 0.91 1.00  CALCIUM 9.4 10.2 9.2 9.0  MG  --   --  2.0 2.1   Liver Function Tests: Recent Labs  Lab 11/05/21 1734  AST 31  ALT 19  ALKPHOS 89  BILITOT 1.2  PROT 8.8*  ALBUMIN 4.1   No results for input(s): LIPASE, AMYLASE in the last 168 hours. No results for input(s): AMMONIA in the last 168 hours. CBC: Recent Labs  Lab 11/05/21 1734 11/06/21 0417 11/07/21 0623  WBC 10.0 7.6 13.9*  NEUTROABS 6.0  --  10.1*  HGB 16.5 16.8 16.9  HCT 48.8 48.5 49.2  MCV 86.5 87.4 86.8  PLT 329 285 326   Cardiac Enzymes: No results for input(s): CKTOTAL, CKMB, CKMBINDEX, TROPONINI in the last 168 hours. BNP: Invalid input(s): POCBNP CBG: Recent Labs  Lab 11/08/21 1641 11/08/21 2120 11/09/21 0812 11/09/21 1224 11/09/21 1653  GLUCAP 390* 188* 219* 238* 277*   D-Dimer No results for input(s): DDIMER in the last 72 hours. Hgb A1c No results for input(s): HGBA1C in the last 72 hours. Lipid Profile No results for input(s): CHOL, HDL, LDLCALC, TRIG, CHOLHDL, LDLDIRECT in the last 72 hours. Thyroid function studies No results for input(s): TSH, T4TOTAL, T3FREE, THYROIDAB in the last  72 hours.  Invalid input(s): FREET3 Anemia work up No results for input(s): VITAMINB12, FOLATE, FERRITIN, TIBC, IRON, RETICCTPCT in the last 72 hours. Urinalysis    Component Value Date/Time   COLORURINE YELLOW (A)  11/05/2021 2056   APPEARANCEUR CLEAR (A) 11/05/2021 2056   LABSPEC 1.030 11/05/2021 2056   PHURINE 5.0 11/05/2021 2056   GLUCOSEU >=500 (A) 11/05/2021 2056   HGBUR NEGATIVE 11/05/2021 2056   BILIRUBINUR NEGATIVE 11/05/2021 2056   [place] NEGATIVE 11/05/2021 2056   PROTEINUR 30 (A) 11/05/2021 2056   NITRITE NEGATIVE 11/05/2021 2056   LEUKOCYTESUR NEGATIVE 11/05/2021 2056   Sepsis Labs Invalid input(s): PROCALCITONIN,  WBC,  LACTICIDVEN Microbiology Recent Results (from the past 240 hour(s))  Resp Panel by RT-PCR (Flu A&B, Covid) Nasopharyngeal Swab     Status: None   Collection Time: 11/05/21  5:34 PM   Specimen: Nasopharyngeal Swab; Nasopharyngeal(NP) swabs in vial transport medium  Result Value Ref Range Status   SARS Coronavirus 2 by RT PCR NEGATIVE NEGATIVE Final    Comment: (NOTE) SARS-CoV-2 target nucleic acids are NOT DETECTED.  The SARS-CoV-2 RNA is generally detectable in upper respiratory specimens during the acute phase of infection. The lowest concentration of SARS-CoV-2 viral copies this assay can detect is 138 copies/mL. A negative result does not preclude SARS-Cov-2 infection and should not be used as the sole basis for treatment or other patient management decisions. A negative result may occur with  improper specimen collection/handling, submission of specimen other than nasopharyngeal swab, presence of viral mutation(s) within the areas targeted by this assay, and inadequate number of viral copies(<138 copies/mL). A negative result must be combined with clinical observations, patient history, and epidemiological information. The expected result is Negative.  Fact Sheet for Patients:  EntrepreneurPulse.com.au  Fact Sheet for Healthcare Providers:  IncredibleEmployment.be  This test is no t yet approved or cleared by the [place] FDA and  has been authorized for detection and/or diagnosis of SARS-CoV-2 by FDA under  an Emergency Use Authorization (EUA). This EUA will remain  in effect (meaning this test can be used) for the duration of the COVID-19 declaration under Section 564(b)(1) of the Act, 21 U.S.C.section 360bbb-3(b)(1), unless the authorization is terminated  or revoked sooner.       Influenza A by PCR NEGATIVE NEGATIVE Final   Influenza B by PCR NEGATIVE NEGATIVE Final    Comment: (NOTE) The Xpert Xpress SARS-CoV-2/FLU/RSV plus assay is intended as an aid in the diagnosis of influenza from Nasopharyngeal swab specimens and should not be used as a sole basis for treatment. Nasal washings and aspirates are unacceptable for Xpert Xpress SARS-CoV-2/FLU/RSV testing.  Fact Sheet for Patients: EntrepreneurPulse.com.au  Fact Sheet for Healthcare Providers: IncredibleEmployment.be  This test is not yet approved or cleared by the [place] FDA and has been authorized for detection and/or diagnosis of SARS-CoV-2 by FDA under an Emergency Use Authorization (EUA). This EUA will remain in effect (meaning this test can be used) for the duration of the COVID-19 declaration under Section 564(b)(1) of the Act, 21 U.S.C. section 360bbb-3(b)(1), unless the authorization is terminated or revoked.  Performed at [facility], [address] [place], [place] [address]      Time coordinating discharge: Over 30 minutes  SIGNED:   Nolberto Hanlon, MD  [facility] 11/10/2021, 4:26 PM Pager   If 7PM-7AM, please contact night-coverage [email or web address] Password [email or web address]

## 2021-11-09 NOTE — Progress Notes (Signed)
Progress Note  Patient Name: Robert Mccann Date of Encounter: 11/09/2021  Primary Cardiologist: Kate Sable, MD  Subjective   Feels well this morning.  Feels the aphasia has improved.  Strength is at baseline.  Denies chest pain or dyspnea.  Inpatient Medications    Scheduled Meds:   stroke: mapping our early stages of recovery book   Does not apply Once   aspirin EC  81 mg Oral Daily   atorvastatin  80 mg Oral Daily   carvedilol  6.25 mg Oral BID WC   enoxaparin (LOVENOX) injection  0.5 mg/kg Subcutaneous Q24H   insulin aspart  0-20 Units Subcutaneous TID WC   insulin aspart  0-5 Units Subcutaneous QHS   insulin glargine-yfgn  17 Units Subcutaneous Daily   multivitamin with minerals  1 tablet Oral Daily   sacubitril-valsartan  1 tablet Oral BID   spironolactone  12.5 mg Oral Daily   torsemide  40 mg Oral BID   Continuous Infusions:  PRN Meds: acetaminophen **OR** acetaminophen, magnesium hydroxide, ondansetron **OR** ondansetron (ZOFRAN) IV, traZODone   Vital Signs    Vitals:   11/08/21 2342 11/09/21 0431 11/09/21 0745 11/09/21 1228  BP: (!) 95/54 (!) 92/58 96/66 (!) 98/59  Pulse: 88 72 79 84  Resp: 16 18 18 18   Temp: 97.8 F (36.6 C) 97.8 F (36.6 C) 98.6 F (37 C) 98.1 F (36.7 C)  TempSrc: Oral   Oral  SpO2: 100% 95% 100% 100%  Weight:      Height:        Intake/Output Summary (Last 24 hours) at 11/09/2021 1237 Last data filed at 11/09/2021 1016 Gross per 24 hour  Intake 600 ml  Output --  Net 600 ml   Filed Weights   11/05/21 1727 11/06/21 0730  Weight: (!) 137.7 kg (!) 138.1 kg    Physical Exam   GEN: Obese, in no acute distress.  HEENT: Grossly normal.  Neck: Supple, no JVD, carotid bruits, or masses. Cardiac: RRR, no murmurs, rubs, or gallops. No clubbing, cyanosis, edema.  Radials 2+, DP/PT 2+ and equal bilaterally.  Respiratory:  Respirations regular and unlabored, clear to auscultation bilaterally. GI: Soft, nontender,  nondistended, BS + x 4. MS: no deformity or atrophy. Skin: warm and dry, no rash. Neuro:  Strength and sensation are intact.  Occasional word searching. Psych: AAOx3.  Normal affect.  Labs    Chemistry Recent Labs  Lab 11/05/21 1734 11/06/21 0417 11/07/21 0623 11/08/21 0511  NA 133* 136 132* 133*  K 3.4* 3.1* 3.3* 3.5  CL 92* 98 92* 92*  CO2 29 29 28 31   GLUCOSE 261* 214* 209* 232*  BUN 18 15 17  22*  CREATININE 1.14 0.89 0.91 1.00  CALCIUM 9.4 10.2 9.2 9.0  PROT 8.8*  --   --   --   ALBUMIN 4.1  --   --   --   AST 31  --   --   --   ALT 19  --   --   --   ALKPHOS 89  --   --   --   BILITOT 1.2  --   --   --   GFRNONAA >60 >60 >60 >60  ANIONGAP 12 9 12 10      Hematology Recent Labs  Lab 11/05/21 1734 11/06/21 0417 11/07/21 0623  WBC 10.0 7.6 13.9*  RBC 5.64 5.55 5.67  HGB 16.5 16.8 16.9  HCT 48.8 48.5 49.2  MCV 86.5 87.4 86.8  MCH 29.3 30.3  29.8  MCHC 33.8 34.6 34.3  RDW 13.3 13.4 13.3  PLT 329 285 326    Cardiac Enzymes  Recent Labs  Lab 11/05/21 1734 11/05/21 2056  TROPONINIHS 22* 25*     Lipids  Lab Results  Component Value Date   CHOL 231 (H) 11/06/2021   HDL 51 11/06/2021   LDLCALC 151 (H) 11/06/2021   TRIG 143 11/06/2021   CHOLHDL 4.5 11/06/2021    HbA1c  Lab Results  Component Value Date   HGBA1C 8.7 (H) 11/06/2021    Radiology    CT ANGIO HEAD NECK W WO CM  Result Date: 11/06/2021 CLINICAL DATA:  Neuro deficit, acute, stroke suspected EXAM: CT ANGIOGRAPHY HEAD AND NECK TECHNIQUE: Multidetector CT imaging of the head and neck was performed using the standard protocol during bolus administration of intravenous contrast. Multiplanar CT image reconstructions and MIPs were obtained to evaluate the vascular anatomy. Carotid stenosis measurements (when applicable) are obtained utilizing NASCET criteria, using the distal internal carotid diameter as the denominator. CONTRAST:  50mL OMNIPAQUE IOHEXOL 350 MG/ML SOLN COMPARISON:  CT head  11/05/2021 and MRI head 11/05/2021 FINDINGS: CT HEAD FINDINGS Brain: Redemonstrated moderate size region of hypoattenuation involving the cortex and white matter of the right temporoparietal region, extending into the anterior right temporal lobe and right occipital lobes, consistent with the acute infarct seen on the 11/05/2021 CT and MRI. Additional hypodensity in the left medial parietal lobe, also unchanged. No definite acute hemorrhage. No mass, mass effect, or midline shift. No hydrocephalus or extra-axial collection. Vascular: See CTA findings below. Skull: Normal. Negative for fracture or focal lesion. Sinuses: Imaged portions are clear. Orbits: No acute finding. Review of the MIP images confirms the above findings CTA NECK FINDINGS Aortic arch: 2 vessel arch with a common origin of the brachiocephalic and left common carotid arteries. Imaged portion shows no evidence of aneurysm or dissection. No significant stenosis of the major arch vessel origins. Right carotid system: No evidence of dissection, stenosis (50% or greater) or occlusion. Left carotid system: No evidence of dissection, stenosis (50% or greater) or occlusion. Vertebral arteries: Left dominant, with diminutive right vertebral artery. No evidence of dissection, stenosis (50% or greater) or occlusion. Skeleton: No acute osseous abnormality. Other neck: Negative. Upper chest: Negative. Review of the MIP images confirms the above findings CTA HEAD FINDINGS Anterior circulation: Both internal carotid arteries are patent to the termini, with mild narrowing of the left supraclinoid ICA (series 10, image 256). A1 segments patent. Anterior communicating artery is not definitively visualized. The left A2 is hypoplastic and is possibly occluded distally (series 10, images 268, 272, 274, 278). The left A2 and distal ACA segments are patent and well perfused. No M1 stenosis or occlusion. Normal MCA bifurcations. Right MCA branches demonstrate multifocal  narrowing and somewhat diminished opacification (series 10, image is 275), possibly with collateralization, although there appears to be perfusion distally. Left distal MCA branches appear perfused. Posterior circulation: Left dominant system, with a diminutive right vertebral artery. Vertebral arteries patent to the vertebrobasilar junction without stenosis. Posterior inferior cerebral arteries patent bilaterally. Basilar patent to its distal aspect. Superior cerebral arteries patent bilaterally. PCAs well perfused to their distal aspects without stenosis. Diminutive posterior communicating arteries visualized bilaterally. Venous sinuses: As permitted by contrast timing, patent. Anatomic variants: None significant Review of the MIP images confirms the above findings IMPRESSION: 1. Redemonstrated right posterior PCA territory infarcts, without evidence of hemorrhagic conversion. 2. Diminutive right M2 and M3 branches, with diminished opacification and  likely multifocal narrowing. 3. Hypoplastic left A2, which is possibly occluded distally. 4. No hemodynamically significant stenosis in the neck. Electronically Signed   By: Merilyn Baba M.D.   On: 11/06/2021 14:38   CT HEAD WO CONTRAST (5MM)  Result Date: 11/08/2021 CLINICAL DATA:  Stroke, rule out interval hemorrhagic conversion before restarting anticoagulation EXAM: CT HEAD WITHOUT CONTRAST TECHNIQUE: Contiguous axial images were obtained from the base of the skull through the vertex without intravenous contrast. COMPARISON:  11/06/2021 FINDINGS: Brain: Redemonstrated moderately sized region of hypoattenuation involving the cortex and white matter of the right temporoparietal region, extending in the anterior right temporal lobe and right occipital lobes, consistent with infarct. Linear areas of hyperdensity within this region (series 2, image 13), likely reflecting petechial hemorrhage, without evidence of hemorrhagic conversion. No new area of infarction. No  mass, mass effect, or midline shift. No extra-axial collection or hydrocephalus. Vascular: No hyperdense vessel. Skull: Normal. Negative for fracture or focal lesion. Sinuses/Orbits: No acute finding. Other: The mastoids are well aerated. IMPRESSION: Redemonstrated posterior right MCA territory stroke, with some areas of petechial hemorrhage but no evidence of hemorrhagic conversion. Electronically Signed   By: Merilyn Baba M.D.   On: 11/08/2021 15:57   CT Head Wo Contrast  Result Date: 11/05/2021 CLINICAL DATA:  Mental status change, unknown cause. Stroke-like symptoms. Speech disturbance. Left-sided numbness. EXAM: CT HEAD WITHOUT CONTRAST TECHNIQUE: Contiguous axial images were obtained from the base of the skull through the vertex without intravenous contrast. COMPARISON:  None. FINDINGS: Brain: A moderate-sized region of hypoattenuation involving cortex and white matter in the temporoparietal region is consistent with an acute to subacute infarct. Additional smaller infarcts are present in the right greater than left occipital lobes and have a generally older appearance than the temporoparietal infarct. Hypodensity in the inferior left temporal lobe is favored to reflect artifact based on sagittal reformats with edema/infarct considered less likely. No acute intracranial hemorrhage, midline shift, or extra-axial fluid collection is identified. Vascular: No hyperdense vessel. Skull: No fracture or suspicious osseous lesion. Sinuses/Orbits: Visualized paranasal sinuses and mastoid air cells are clear. Unremarkable orbits. Other: None. IMPRESSION: 1. Bilateral cerebral infarcts including a moderate-sized acute to subacute right temporoparietal infarct. 2. No acute intracranial hemorrhage. Electronically Signed   By: Logan Bores M.D.   On: 11/05/2021 18:00   MR BRAIN WO CONTRAST  Result Date: 11/05/2021 CLINICAL DATA:  Stroke follow-up EXAM: MRI HEAD WITHOUT CONTRAST TECHNIQUE: Multiplanar, multiecho  pulse sequences of the brain and surrounding structures were obtained without intravenous contrast. COMPARISON:  None. FINDINGS: Brain: There is a large acute infarct of the posterior right MCA territory. No acute ischemia in any other vascular territory. There is moderate edema at the infarct site. No acute or chronic hemorrhage. The midline structures are normal. Vascular: Major flow voids are preserved. Skull and upper cervical spine: Normal calvarium and skull base. Visualized upper cervical spine and soft tissues are normal. Sinuses/Orbits:No paranasal sinus fluid levels or advanced mucosal thickening. No mastoid or middle ear effusion. Normal orbits. IMPRESSION: Large acute posterior right MCA territory infarct without hemorrhage or mass effect. Electronically Signed   By: Ulyses Jarred M.D.   On: 11/05/2021 22:29   US Venous Img Lower Bilateral (DVT)  Result Date: 11/07/2021 CLINICAL DATA:  Lower extremity edema, stroke and history of prior pulmonary embolism. EXAM: BILATERAL LOWER EXTREMITY VENOUS DOPPLER ULTRASOUND TECHNIQUE: Gray-scale sonography with graded compression, as well as color Doppler and duplex ultrasound were performed to evaluate the lower extremity deep  venous systems from the level of the common femoral vein and including the common femoral, femoral, profunda femoral, popliteal and calf veins including the posterior tibial, peroneal and gastrocnemius veins when visible. The superficial great saphenous vein was also interrogated. Spectral Doppler was utilized to evaluate flow at rest and with distal augmentation maneuvers in the common femoral, femoral and popliteal veins. COMPARISON:  06/29/2021 FINDINGS: RIGHT LOWER EXTREMITY Common Femoral Vein: No evidence of thrombus. Normal compressibility, respiratory phasicity and response to augmentation. Saphenofemoral Junction: No evidence of thrombus. Normal compressibility and flow on color Doppler imaging. Profunda Femoral Vein: No evidence  of thrombus. Normal compressibility and flow on color Doppler imaging. Femoral Vein: No evidence of thrombus. Normal compressibility, respiratory phasicity and response to augmentation. Popliteal Vein: No evidence of thrombus. Normal compressibility, respiratory phasicity and response to augmentation. Calf Veins: No evidence of thrombus. Normal compressibility and flow on color Doppler imaging. Superficial Great Saphenous Vein: No evidence of thrombus. Normal compressibility. Venous Reflux:  None. Other Findings: No evidence of superficial thrombophlebitis or abnormal fluid collection. LEFT LOWER EXTREMITY Common Femoral Vein: No evidence of thrombus. Normal compressibility, respiratory phasicity and response to augmentation. Saphenofemoral Junction: No evidence of thrombus. Normal compressibility and flow on color Doppler imaging. Profunda Femoral Vein: No evidence of thrombus. Normal compressibility and flow on color Doppler imaging. Femoral Vein: No evidence of thrombus. Normal compressibility, respiratory phasicity and response to augmentation. Popliteal Vein: No evidence of thrombus. Normal compressibility, respiratory phasicity and response to augmentation. Calf Veins: No evidence of thrombus. Normal compressibility and flow on color Doppler imaging. Superficial Great Saphenous Vein: No evidence of thrombus. Normal compressibility. Venous Reflux:  None. Other Findings: No evidence of superficial thrombophlebitis or abnormal fluid collection. IMPRESSION: No evidence of deep venous thrombosis in either lower extremity. Electronically Signed   By: Irish Lack M.D.   On: 11/07/2021 09:12   Telemetry    RSR - Personally Reviewed  Cardiac Studies   2D Echocardiogram 11.7.2022   1. Left ventricular ejection fraction, by estimation, is <20%. The left  ventricle has severely decreased function. The left ventricle demonstrates  global hypokinesis. The left ventricular internal cavity size was   moderately dilated. There is mild left  ventricular hypertrophy. Left ventricular diastolic parameters are  consistent with Grade II diastolic dysfunction (pseudonormalization).   2. Right ventricular systolic function is moderately reduced. The right  ventricular size is not well visualized.   3. The mitral valve is normal in structure. No evidence of mitral valve  regurgitation.   4. The aortic valve is tricuspid. Aortic valve regurgitation is not  visualized.  _____________  Transesophageal Echocardiogram 11.9.2022  1. Left ventricular ejection fraction, by estimation, is 20 to 25%. The  left ventricle has severely decreased function. The left ventricle  demonstrates global hypokinesis. The left ventricular internal cavity size  was moderately dilated.   2. Right ventricular systolic function is severely reduced. The right  ventricular size is normal.   3. No left atrial/left atrial appendage thrombus was detected.   4. The mitral valve is normal in structure. No evidence of mitral valve  regurgitation.   5. The aortic valve is tricuspid. Aortic valve regurgitation is not  visualized.   6. Agitated saline contrast bubble study was negative, with no evidence  of any interatrial shunt.  _____________  Patient Profile     25 y.o. 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  Assessment & Plan    1.  Acute posterior right MCA territory infarct: Presented November 6 with a 3-day history of left-sided weakness and aphasia.  MRI with large acute posterior right MCA territory infarct.  TEE without evidence of LV thrombus.  Patient reports compliance with all of his medications including Eliquis as an outpatient prior to admission.  Areas of petechial hemorrhage noted on follow-up CT  necessitating holding of Eliquis.  Currently on low-dose aspirin.  Per neuro, no role for ongoing aspirin once Eliquis resumed.  We did discuss possibly switching Eliquis to an alternate direct oral anticoagulant versus warfarin however, patient states that he prefers to remain on Eliquis going forward.  2.  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  Ongoing concern for noncompaction however body habitus likely not suitable for cardiac MRI.  He reports good compliance with heart failure meds at home and is euvolemic on examination today.  He remains on beta-blocker, Entresto, spironolactone, and torsemide.  Pressures been trending in the mid to high 90s and we may need to hold spironolactone if he drops any lower given recent stroke.  We will plan on outpatient electrophysiology referral for ICD placement at some point in the future.  Discussed this with patient today.  3.  Pulmonary embolism: Status post thrombectomy over the summer.  He has been anticoagulate with Eliquis as an outpatient and reports compliance.  4.  Hypertension: Blood pressure soft, which has been his baseline since starting heart failure medications.  Continue to follow.  May need to hold spironolactone if pressures drop any further.  5.  History of intermittent nocturnal Mobitz 1: Patient previously set up for outpatient sleep study.  He still has the device at home but it appears that he was never contacted through our office regarding prior authorization and thus has not completed the study.  I will look into this through our office nursing staff.  6.  Hyperlipidemia: Statin initiated in setting of stroke.  Signed, Murray Hodgkins, NP  11/09/2021, 12:37 PM    For questions or updates, please contact   Please consult [email or web address] for contact info under Cardiology/STEMI.

## 2021-11-10 ENCOUNTER — Encounter: Payer: Self-pay | Admitting: Cardiology

## 2021-11-10 ENCOUNTER — Other Ambulatory Visit: Payer: Self-pay | Admitting: Neurology

## 2021-11-10 ENCOUNTER — Other Ambulatory Visit: Payer: Self-pay | Admitting: Nurse Practitioner

## 2021-11-10 DIAGNOSIS — D6859 Other primary thrombophilia: Secondary | ICD-10-CM

## 2021-11-10 DIAGNOSIS — I639 Cerebral infarction, unspecified: Secondary | ICD-10-CM

## 2021-11-10 LAB — BETA-2-GLYCOPROTEIN I ABS, IGG/M/A
Beta-2 Glyco I IgG: 37 GPI IgG units — ABNORMAL HIGH (ref 0–20)
Beta-2-Glycoprotein I IgA: >150 GPI IgA units — ABNORMAL HIGH (ref 0–25)
Beta-2-Glycoprotein I IgM: 58 GPI IgM units — ABNORMAL HIGH (ref 0–32)

## 2021-11-10 LAB — PROTEIN C, TOTAL: Protein C, Total: 127 % (ref 60–150)

## 2021-11-10 LAB — PROTEIN S ACTIVITY: Protein S Activity: 105 % (ref 63–140)

## 2021-11-10 LAB — PROTEIN S, TOTAL: Protein S Ag, Total: 129 % (ref 60–150)

## 2021-11-10 LAB — PROTEIN C ACTIVITY: Protein C Activity: 185 % — ABNORMAL HIGH (ref 73–180)

## 2021-11-10 NOTE — Progress Notes (Unsigned)
Ambulatory referral to neurology placed 

## 2021-11-10 NOTE — Progress Notes (Signed)
Neurology progress note  S: Patient has no subjective neurologic complaints today beyond persistent L visual neglect.CT head today improved, no blood products visible  O:  Vitals:   11/09/21 0745 11/09/21 1228  BP: 96/66 (!) 98/59  Pulse: 79 84  Resp: 18 18  Temp: 98.6 F (37 C) 98.1 F (36.7 C)  SpO2: 100% 100%   Physical Exam Gen: A&O x4, NAD HEENT: Atraumatic, normocephalic;mucous membranes moist; oropharynx clear, tongue without atrophy or fasciculations. Neck: Supple, trachea midline. Resp: CTAB, no w/r/r CV: RRR, no m/g/r; nml S1 and S2. 2+ symmetric peripheral pulses. Abd: soft/NT/ND; nabs x 4 quad Extrem: Nml bulk; no cyanosis, clubbing, or edema.   Neuro: *MS: A&O x4. Follows multi-step commands.  *Speech: fluid, mild dysarthria, able to name and repeat *CN:    I: Deferred   II,III: PERRLA, L homonymous hemianopsia, optic discs unable to be visualized 2/2 pupillary constriction   III,IV,VI: EOMI w/o nystagmus, no ptosis   V: Sensation intact from V1 to V3 to LT   VII: Eyelid closure was full.  Subtle L NLF flattening   VIII: Hearing intact to voice   IX,X: Voice normal, palate elevates symmetrically    XI: SCM/trap 5/5 bilat   XII: Tongue protrudes midline, no atrophy or fasciculations    *Motor:   Normal bulk.  No tremor, rigidity or bradykinesia. No pronator drift. All extremities 5/5 throughout.  *Sensory: Intact to light touch, pinprick, temperature vibration throughout. Symmetric. Propioception intact bilat.  No double-simultaneous extinction.  *Coordination:  Finger-to-nose, heel-to-shin, rapid alternating motions were intact. *Reflexes:  2+ and symmetric throughout without clonus; toes down-going bilat *Gait: deferred   NIHSS = 1 facial asymmetry, visual neglect  Stroke workup this hospitalization:   MRI brain wo contrast Large acute posterior right MCA territory infarct without hemorrhage or mass effect.   CTA H&N 1. Redemonstrated right  posterior PCA territory infarcts, without evidence of hemorrhagic conversion. 2. Diminutive right M2 and M3 branches, with diminished opacification and likely multifocal narrowing. 3. Hypoplastic left A2, which is possibly occluded distally. 4. No hemodynamically significant stenosis in the neck.   TTE  1. Left ventricular ejection fraction, by estimation, is <20%. The left  ventricle has severely decreased function. The left ventricle demonstrates  global hypokinesis. The left ventricular internal cavity size was  moderately dilated. There is mild left  ventricular hypertrophy. Left ventricular diastolic parameters are  consistent with Grade II diastolic dysfunction (pseudonormalization).   2. Right ventricular systolic function is moderately reduced. The right  ventricular size is not well visualized.   3. The mitral valve is normal in structure. No evidence of mitral valve  regurgitation.   4. The aortic valve is tricuspid. Aortic valve regurgitation is not  visualized.  IAS/Shunts: No atrial level shunt detected by color flow Doppler.  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 From a stroke standpoint he is doing remarkably well and his only deficit is L sided visual neglect OU. Because of this I have told him he cannot drive until cleared by OT as outpatient.   TEE showed no intracardiac clot. CT head 11/9 shows small amt petechial hemorrhage w/o frank hemorrhagic conversion, improved today with no visible blood  products   Recommendations     - Hypercoag workup drawn 11/9 with exception of antithrombin functional activity which will not be accurate given patient is on lovenox. Note: eliquis will have been held x36 hrs  at time of lab draw. Patient showed be referred to hem/onc at hospital discharge for evaluation of these results and any further workup for hypercoag state - OK to restart eliquis at home dose. Cardiology has suggested addition of aspirin since he had a stroke on eliquis; recommend considering this at outpatient neurology f/u given small amount of petechial hemorrhage on head CT yesterday. - OK from neurology standpoint for discharge. He has been given strict return precautions to call 911 for severe headache or any change in neurologic sx - No driving until cleared by outpatient neurology given L visual neglect. He may require outpatient OT functional evaluation for this.  - I will arrange o/p neurology f/u    Bing Neighbors, MD [facility] [phone]  If 7pm- 7am, please page neurology on call as listed in AMION.

## 2021-11-14 ENCOUNTER — Ambulatory Visit: Payer: BC Managed Care – PPO | Attending: Infectious Diseases | Admitting: Speech Pathology

## 2021-11-14 DIAGNOSIS — R4701 Aphasia: Secondary | ICD-10-CM | POA: Insufficient documentation

## 2021-11-14 DIAGNOSIS — R482 Apraxia: Secondary | ICD-10-CM | POA: Insufficient documentation

## 2021-11-14 DIAGNOSIS — R278 Other lack of coordination: Secondary | ICD-10-CM | POA: Insufficient documentation

## 2021-11-14 DIAGNOSIS — M6281 Muscle weakness (generalized): Secondary | ICD-10-CM | POA: Insufficient documentation

## 2021-11-14 DIAGNOSIS — R41841 Cognitive communication deficit: Secondary | ICD-10-CM | POA: Insufficient documentation

## 2021-11-14 LAB — FACTOR 5 LEIDEN

## 2021-11-15 ENCOUNTER — Ambulatory Visit: Payer: BC Managed Care – PPO

## 2021-11-15 LAB — PROTHROMBIN GENE MUTATION

## 2021-11-17 LAB — ANTIPHOSPHOLIPID SYNDROME EVAL, BLD
Anticardiolipin IgA: <9 APL U/mL (ref 0–11)
Anticardiolipin IgG: <9 GPL U/mL (ref 0–14)
Anticardiolipin IgM: 12 MPL U/mL (ref 0–12)
DRVVT: 31.7 s (ref 0.0–47.0)
PTT Lupus Anticoagulant: 34.5 s (ref 0.0–51.9)
Phosphatydalserine, IgA: 2 APS Units (ref 0–19)
Phosphatydalserine, IgG: 9 Units (ref 0–30)
Phosphatydalserine, IgM: 19 Units (ref 0–30)

## 2021-11-21 ENCOUNTER — Encounter: Payer: BC Managed Care – PPO | Admitting: Speech Pathology

## 2021-11-27 ENCOUNTER — Encounter: Payer: BC Managed Care – PPO | Admitting: Occupational Therapy

## 2021-11-27 ENCOUNTER — Encounter: Payer: BC Managed Care – PPO | Admitting: Speech Pathology

## 2021-11-28 ENCOUNTER — Other Ambulatory Visit: Payer: Self-pay

## 2021-11-28 ENCOUNTER — Encounter: Payer: Self-pay | Admitting: Occupational Therapy

## 2021-11-28 ENCOUNTER — Ambulatory Visit: Payer: BC Managed Care – PPO | Admitting: Speech Pathology

## 2021-11-28 ENCOUNTER — Ambulatory Visit: Payer: BC Managed Care – PPO | Admitting: Occupational Therapy

## 2021-11-28 VITALS — BP 132/79 | HR 96

## 2021-11-28 DIAGNOSIS — M6281 Muscle weakness (generalized): Secondary | ICD-10-CM | POA: Diagnosis present

## 2021-11-28 DIAGNOSIS — R4701 Aphasia: Secondary | ICD-10-CM

## 2021-11-28 DIAGNOSIS — R482 Apraxia: Secondary | ICD-10-CM | POA: Diagnosis present

## 2021-11-28 DIAGNOSIS — R41841 Cognitive communication deficit: Secondary | ICD-10-CM

## 2021-11-28 DIAGNOSIS — R278 Other lack of coordination: Secondary | ICD-10-CM

## 2021-11-28 NOTE — Therapy (Signed)
[place] [facility] MAIN [facility] SERVICES [address] [place], [place], [address] Phone: [phone]   Fax:  [phone]  Occupational Therapy Evaluation  Patient Details  Name: Robert Mccann MRN: [id number] Date of Birth: [birth year] Referring Provider (OT): Stann Mainland. Fitzgerald   Encounter Date: 11/28/2021   OT End of Session - 11/28/21 1329     Visit Number 1    Number of Visits 7    Date for OT Re-Evaluation 02/06/22    OT Start Time 1000    OT Stop Time 1100    OT Time Calculation (min) 60 min    Equipment Utilized During Treatment dynomometer    Activity Tolerance Patient tolerated treatment well    Behavior During Therapy WFL for tasks assessed/performed             Past Medical History:  Diagnosis Date   AV block, Mobitz 2    a. 05/2021 noted during periods of sleep @ time of hospitalization for PE.   Cardiomyopathy (HCC)    a. 05/2021 Echo: EF <20%, glob HK. [place]:C ratio of 2.1:1 suggesting non-compaction. Gr2 DD. Sev reduced RV fxn. Nl PASP. Mildly dil RA. Mild MR.   Diabetes mellitus without complication (HCC)    HFrEF (heart failure with reduced ejection fraction) (HCC)    a. 05/2021 Echo: EF <20%. ? non-compaction.   Hypertension    Pulmonary embolism (HCC)    a. 05/2021 CTA Chest: PE extending from lobar arteris of RUL and RLL w/ concern for pulml infarct-->s/p thrombolysis and thrombectomy of RUL/RML/RLL.    Past Surgical History:  Procedure Laterality Date   HAND SURGERY Left age 78   HAND SURGERY     PULMONARY THROMBECTOMY N/A 06/28/2021   Procedure: PULMONARY THROMBECTOMY;  Surgeon: Annice Needy, MD;  Location: [facility] INVASIVE CV LAB;  Service: Cardiovascular;  Laterality: N/A;   TEE WITHOUT CARDIOVERSION N/A 11/08/2021   Procedure: TRANSESOPHAGEAL ECHOCARDIOGRAM (TEE);  Surgeon: Debbe Odea, MD;  Location: [facility] ORS;  Service: Cardiovascular;  Laterality: N/A;    Vitals:   11/28/21 1315  BP: 132/79  Pulse: 96      Subjective Assessment - 11/28/21 0912     Subjective  Pt reports feeling lightheaded this AM while helping his mother dress.    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 No/denies               [facility] OT Assessment - 11/28/21 0918       Assessment   Medical Diagnosis R MCA ischemic stroke    Referring Provider (OT) Stann Mainland. Fitzgerald    Onset Date/Surgical Date 11/02/21    Hand Dominance Left    Next MD Visit 12/04/21    Prior Therapy none      Precautions   Precautions None    Precaution Comments per neurology MD note from hospital pt should not drive until cleared by outpatient neurology - pt reports driving     Restrictions   Weight Bearing Restrictions No      Balance Screen   Has the patient fallen in the past 6 months No    Has  the patient had a decrease in activity level because of a fear of falling?  No    Is the patient reluctant to leave their home because of a fear of falling?  No      Home  Environment   Family/patient expects to be discharged to: Private residence    Living Arrangements Parent    Available Help at Discharge Available PRN/intermittently    Type of Home House    Home Access Stairs    Entrance Stairs-Number of Steps 1    Home Layout Two level     Alternate Level Stairs-Rails Left    Alternate Level Stairs - Number of Steps 12    Bathroom Copywriter, advertising;Tub/Shower unit    Office manager No    Home Biochemist, clinical - 2 wheels;Bedside commode;Cane - single point    Lives With --   parents     Prior Function   Level of Independence Independent    Vocation Full time employment    Vocation  Requirements 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    Leisure video games, bowling      ADL   Eating/Feeding Independent    Grooming Independent    Ship broker - Solicitor -  Database administrator Independent      IADL   Prior Level of Pension scheme manager independently for small purchases   difficulty scanning for items, rquires increased time   Prior Level of Function Light Housekeeping Independent    Light Housekeeping Does personal laundry completely    Prior Level of Function Meal Prep Independent    Meal Prep Plans, prepares and serves adequate meals independently    Prior Level of Function Best boy Drives own vehicle    Prior Level of Function Medication Managment Independent    Medication Management Takes responsibility if medication is prepared in advance in seperate dosage    Prior Level of Function Chemical engineer financial matters independently (budgets, writes checks, pays rent, bills goes to bank), collects and keeps track of income      Mobility   Mobility Status Independent      Written Expression   Dominant Hand Left    Handwriting 90% legible   poor word spacing   Written Experience Within Functional Limits      Vision - History   Baseline Vision No visual deficits    Patient Visual Report Peripheral vision impairment      Vision Assessment   Eye Alignment Within Functional Limits    Vision Assessment Vision tested    Tracking/Visual Pursuits Able to track stimulus in all quads without difficulty    Saccades Within functional limits    Visual Fields No apparent deficits    Depth Perception decreased L upper  quadrant depth perception      Activity Tolerance   Activity Tolerance Endurance does not limit participation in activity      Cognition   Overall Cognitive Status Within Functional Limits for tasks assessed      Observation/Other Assessments   Focus on Therapeutic Outcomes (FOTO)  98  Coordination   9 Hole Peg Test Right;Left    Right 9 Hole Peg Test 28    Left 9 Hole Peg Test 32      Hand Function   Right Hand Grip (lbs) 110    Right Hand Lateral Pinch 25 lbs    Right Hand 3 Point Pinch 20 lbs    Left Hand Grip (lbs) 110    Left Hand Lateral Pinch 24 lbs    Left 3 point pinch 18 lbs               Self Care Pt completed clock drawing, visual scanning sheet, typing, and handwriting exercises. Pt demonstrates good awareness of spacing deficits during writing and clock drawing. Requires cues to identify errors in visual scanning sheet - pt asked to read underlined letters out loud from a list of letters with 93% accuracy (6/82). Pt utilizing finger scanning technique after multiple errors committed ~halfway through task. All errors located on L 1/3 of page.  Of note, pt reports driving to work and appointments however per inpatient neurology discharge note on 11/10/21 pt should not drive until cleared by outpatient neurology 2/2 L visual deficits.                OT Education - 11/28/21 1328     Education Details HEP, visual scanning strategies    Person(s) Educated Patient    Methods Explanation;Demonstration    Comprehension Verbalized understanding;Returned demonstration              OT Short Term Goals - 11/28/21 1345       OT SHORT TERM GOAL #1   Title Pt will verbalize plan to implement 3/3 visual scanning strategies for IADL tasks    Baseline Eval: unaware of strategies    Time 3    Period Weeks    Status New    Target Date 12/19/21               OT Long Term Goals - 11/28/21 1351       OT LONG TERM GOAL #1   Title Pt will  improve L grip strength by 5 lbs or more for preferred leisure activity of bowling    Baseline Eval: 110#    Time 6    Period Weeks    Status New    Target Date 01/09/22      OT LONG TERM GOAL #2   Title Pt will improve L 9 PEG test by 10 seconds to improve coordination for preferred leisure activity of video games    Baseline Eval: 32 sec    Time 6    Period Weeks    Status New    Target Date 01/09/22      OT LONG TERM GOAL #3   Title Pt will write paragraph with 100% legibility    Baseline Eval: writes 1 sentence in 30 seconds with 90% legibility and poor letter spacing    Time 6    Period Weeks    Status New    Target Date 01/09/22      OT LONG TERM GOAL #4   Title Pt will type paragraph in 20 wpm with 90% accuracy or greater.    Baseline Eval: types paragraph in 11 wpm with 99% accuracy.    Time 6    Period Weeks    Status New    Target Date 01/09/22  Plan - 11/28/21 1330     Clinical Impression Statement Pt is a 25 yo male with a R MCA ischemic stroke on 11/05/21. Pt presents with dominant LUE weakness, decreased coordination, and L negelct. FOTO score is 98. Pt could benefit from skilled OT services to learn compensatory visual strategies and improve dominant L hand grip/coordination to be able to increase engagement of the LUE during IADL tasks.    OT Occupational Profile and History Detailed Assessment- Review of Records and additional review of physical, cognitive, psychosocial history related to current functional performance    Occupational performance deficits (Please refer to evaluation for details): IADL's;Leisure    Body Structure / Function / Physical Skills Coordination;FMC;IADL;Vision    Rehab Potential Good    Clinical Decision Making Several treatment options, min-mod task modification necessary    Comorbidities Affecting Occupational Performance: None    Modification or Assistance to Complete Evaluation  No modification of tasks  or assist necessary to complete eval    OT Frequency 1x / week    OT Duration 6 weeks    OT Treatment/Interventions Self-care/ADL training;Therapeutic activities;Therapeutic exercise;Coping strategies training;Visual/perceptual remediation/compensation;Patient/family education    Consulted and Agree with Plan of Care Patient             Patient will benefit from skilled therapeutic intervention in order to improve the following deficits and impairments:   Body Structure / Function / Physical Skills: Coordination, FMC, IADL, Vision       Visit Diagnosis: Other lack of coordination  Muscle weakness (generalized)    Problem List Patient Active Problem List   Diagnosis Date Noted   Cerebrovascular accident (CVA) (HCC) 11/05/2021   PE (pulmonary thromboembolism) (HCC) 06/28/2021   HFrEF (heart failure with reduced ejection fraction) (HCC)    Acute pulmonary embolism with acute cor pulmonale (HCC)    Obesity, Class III, BMI 40-49.9 (morbid obesity) (HCC)    DM II (diabetes mellitus, type II), controlled (HCC) 06/27/2021   Chest pain 06/27/2021   HTN (hypertension) 06/27/2021   Malaise 05/25/2014   Routine history and physical examination of adult 05/25/2014   Obesity 05/25/2014    Kathie Dike, M.S. OTR/L  11/28/21, 2:23 PM  ascom [phone]   [place] [facility] MAIN [facility] SERVICES [address] [place], [place], [address] Phone: [phone]   Fax:  [phone]  Name: Nolin Grell MRN: [id number] Date of Birth: 12-20-1996

## 2021-11-29 ENCOUNTER — Encounter: Payer: BC Managed Care – PPO | Admitting: Speech Pathology

## 2021-11-29 ENCOUNTER — Inpatient Hospital Stay: Payer: BC Managed Care – PPO

## 2021-11-29 ENCOUNTER — Inpatient Hospital Stay: Payer: BC Managed Care – PPO | Attending: Internal Medicine | Admitting: Internal Medicine

## 2021-11-29 NOTE — Therapy (Signed)
[place] [facility] MAIN [facility] SERVICES [address] [place], [place], [address] Phone: [phone]   Fax:  [phone]  Speech Language Pathology Evaluation  Patient Details  Name: Robert Mccann MRN: [id number] Date of Birth: 12-Feb-1996 Referring Provider (SLP): Clydie Braun, MD   Encounter Date: 11/28/2021   End of Session - 11/29/21 0949     Visit Number 1    Number of Visits 25    Date for SLP Re-Evaluation 02/26/22    Authorization Type [employer] - 30 visit limit combined    Authorization - Visit Number 1    Progress Note Due on Visit 10    SLP Start Time 1000    SLP Stop Time  1100    SLP Time Calculation (min) 60 min    Activity Tolerance Patient tolerated treatment well             Past Medical History:  Diagnosis Date   AV block, Mobitz 2    a. 05/2021 noted during periods of sleep @ time of hospitalization for PE.   Cardiomyopathy (HCC)    a. 05/2021 Echo: EF <20%, glob HK. [place]:C ratio of 2.1:1 suggesting non-compaction. Gr2 DD. Sev reduced RV fxn. Nl PASP. Mildly dil RA. Mild MR.   Diabetes mellitus without complication (HCC)    HFrEF (heart failure with reduced ejection fraction) (HCC)    a. 05/2021 Echo: EF <20%. ? non-compaction.   Hypertension    Pulmonary embolism (HCC)    a. 05/2021 CTA Chest: PE extending from lobar arteris of RUL and RLL w/ concern for pulml infarct-->s/p thrombolysis and thrombectomy of RUL/RML/RLL.    Past Surgical History:  Procedure Laterality Date   HAND SURGERY Left age 39   HAND SURGERY     PULMONARY THROMBECTOMY N/A 06/28/2021   Procedure: PULMONARY THROMBECTOMY;  Surgeon: Annice Needy, MD;  Location: [facility] INVASIVE CV LAB;  Service: Cardiovascular;  Laterality: N/A;   TEE WITHOUT CARDIOVERSION N/A 11/08/2021   Procedure: TRANSESOPHAGEAL ECHOCARDIOGRAM (TEE);  Surgeon: Debbe Odea, MD;  Location: [facility] ORS;  Service: Cardiovascular;  Laterality: N/A;    There were no vitals filed for  this visit.   Subjective Assessment - 11/29/21 0927     Subjective "It's like I'm tricking over words, can't find it."    Currently in Pain? 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    Behavioral/Cognition alert, pleasant, cooperative    Mobility Status ambulated unassisted      Balance Screen   Has the patient fallen in the past 6 months No    Has the patient had a decrease in activity level because of a fear of falling?  No    Is the patient reluctant to leave their home because of a fear of falling?  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     Pain Assessment   Pain Assessment No/denies pain      Cognition   Overall Cognitive Status Impaired/Different from baseline    Area of Impairment Safety/judgement;Attention;Memory;Awareness    Safety/Judgement Decreased awareness of deficits;Decreased awareness of safety   patient reports driving; has visual deficits and left-neglect, not cleared for driving yet per MD   Attention Selective    Selective Attention Impaired    Selective Attention Impairment Functional basic   left neglect (omitting symbols during cancellation and trailmaking), incorrect cancellation of 9 symbols), visuospatial deficits:  cancelling above, not on the symbol x 2   Memory Impaired    Memory Impairment Storage deficit;Decreased recall of new information   story retell remembered 50% of details   Awareness Impaired    Awareness Impairment Emergent impairment;Anticipatory impairment    Problem Solving --   assessment not yet completed; suspect impaired due to lower level impairments in attention, memory   Executive Function --   assessment not yet completed; suspect impaired due to lower level impairments in attention, memory     Auditory Comprehension   Overall Auditory Comprehension Appears within functional limits for tasks assessed    Yes/No Questions Within Functional Limits    Conversation Moderately complex    Interfering Components Attention;Processing speed    EffectiveTechniques Extra processing time      Visual Recognition/Discrimination   Discrimination Not tested      Reading Comprehension   Reading Status Not tested   will assess next 1-3 visits     Expression   Primary Mode of Expression Verbal      Verbal Expression   Overall Verbal Expression Impaired    Initiation No impairment    Automatic Speech Name;Social Response    Level of Generative/Spontaneous Verbalization Conversation    Naming Impairment    Confrontation 50-74% accurate   44/60 items on BNT   Verbal Errors Semantic paraphasias;Phonemic paraphasias;Aware of errors   aware and attempts correction ~90% of the time   Pragmatics No impairment    Effective Techniques Other (Comment);Phonemic cues;Semantic cues;Written cues   extra time     Written Expression   Dominant Hand Left    Written Expression Not tested   to be assessed next session     Oral Motor/Sensory Function   Overall Oral Motor/Sensory Function Appears within functional limits for tasks assessed    Labial ROM Within Functional Limits    Labial Symmetry Within Functional Limits    Labial Strength Within Functional Limits    Labial Coordination WFL     Lingual ROM Within Functional Limits    Lingual Symmetry Within Functional Limits    Lingual Strength Within Functional Limits    Lingual Coordination WFL    Facial ROM Within Functional Limits    Facial Symmetry Within Functional Limits    Facial Strength Within Functional Limits    Velum Within Functional Limits    Mandible Within Functional Limits      Motor Speech   Overall Motor Speech Other (comment)   question mild verbal apraxia   Respiration Within functional limits    Motor Planning --   some breakdowns with groping/repetitions   Motor Speech Errors Inconsistent;Aware;Groping for words  Interfering Components --   ? apraxic errors vs paraphasias   Effective Techniques Slow rate    Phonation WFL      Standardized Assessments   Standardized Assessments  [place] Naming Test-2nd edition;Cognitive Linguistic Quick Test    [place] Naming Test-2nd edition  44/60 items   self-corrected paraphasias on 30% of correct responses (eg: wolfchair/wheelchair, pretchul/pretzel, bow/boat, handlick/hammock, dalt/dart)     Cognitive Linguistic Quick Test (Ages 33-69)   Attention --   Testing initiated and to be completed next 1-3 visits            [employer] Test  [employer] Test was administered. Pt scored 44/60. This is 3.1 SD below norm of 55.8 for age range of 18-39.   Number of spontaneously given correct responses: 44 Number of stimulus cues given: 16 Number of correct responses following a stimulus cue: 0 Number of phonemic cues: 4 Number of correct responses following a phonemic cue: 1 Number of multiple choices given: 9 Number of correct choices: 4  Paraphasias Phonological: 3 Verbal: 8 Neologistic: 1 Multi-word: 0 Perceptual:0       SLP Education - 11/29/21 0948     Education Details Implications of left visual neglect, visuospatial deficits on safety with driving    Person(s) Educated Patient    Methods Explanation    Comprehension  Verbalized understanding;Need further instruction              SLP Short Term Goals - 11/29/21 1227       SLP SHORT TERM GOAL #1   Title Patient will complete standardized assessment of cognition, writing, and reading skills, with goals added PRN.    Time 10    Period --   sessions   Status New      SLP SHORT TERM GOAL #2   Title Patient will demonstrate emergent awareness by identifying >85% of errors in cognitive-linguistic tasks with rare min cues for double-checking.    Time 10    Period --   sessions   Status New      SLP SHORT TERM GOAL #3   Title Patient will reduce breakdowns to <3 in 8 minutes conversation using strategies for aphasia, apraxia.    Time 10    Period --   sessions   Status New              SLP Long Term Goals - 11/29/21 1238       SLP LONG TERM GOAL #1   Title Patient will reduce breakdowns to <3 in 20 minutes conversation using strategies for aphasia, apraxia.    Time 12    Period Weeks    Status New    Target Date 02/26/22      SLP LONG TERM GOAL #2   Title Patient will use system for executive function and recall to manage appointments, medications, household tasks and finances >90% accuracy.    Time 12    Period Weeks    Status New    Target Date 02/26/22              Plan - 11/29/21 0951     Clinical Impression Statement Robert Mccann is a pleasant 25 y.o. left-handed male who is presenting with mild aphasia, and at least mild (more likely mild-moderate) cognitive communication deficits s/p right CVA. Initiated standardized cognitive testing today, to be completed along with assessment of reading/writing in subsequent sessions. Speech is fluent but characterized by frequent paraphasias and occasional groping/articulatory breakdowns (?  mild verbal apraxia vs phonemic paraphasias). Auditory comprehension appears adequate for mod complex conversation and pt is able to follow verbal instructions. Patient reports he received  speech therapy for stuttering as a child. Appears to have visuospatial deficits and left neglect: he often overlooked/omitted symbols on the left side, and was also noted to have difficulty localizing/crossing out symbols (crossing above instead of on the symbol). Patient reports he is currently driving, but has not been cleared by neurology to do so. Was educated on implications of visual deficits on safety with driving. Patient reports needing to re-read materials and making spelling/word choice errors when writing. He was generally aware of verbal errors, attempting correction 90% of the time. I recommend skilled ST to improve pt's language skills for communicating with family, friends, and at work, as well as cognitive communication skills to maximize safety, independence, and ability to perform job functions.    Speech Therapy Frequency 2x / week    Duration 12 weeks    Treatment/Interventions Language facilitation;Environmental controls;Cueing hierarchy;SLP instruction and feedback;Cognitive reorganization;Functional tasks;Compensatory strategies;Internal/external aids;Multimodal communcation approach;Patient/family education    Potential to Achieve Goals Good    Potential Considerations Other (comment)   ability to attend sessions (works night shift, sleeps during the day)   SLP Home Exercise Plan to be provided    Consulted and Agree with Plan of Care Patient             Patient will benefit from skilled therapeutic intervention in order to improve the following deficits and impairments:   Cognitive communication deficit  Aphasia  Verbal apraxia    Problem List Patient Active Problem List   Diagnosis Date Noted   Cerebrovascular accident (CVA) (HCC) 11/05/2021   PE (pulmonary thromboembolism) (HCC) 06/28/2021   HFrEF (heart failure with reduced ejection fraction) (HCC)    Acute pulmonary embolism with acute cor pulmonale (HCC)    Obesity, Class III, BMI 40-49.9 (morbid obesity)  (HCC)    DM II (diabetes mellitus, type II), controlled (HCC) 06/27/2021   Chest pain 06/27/2021   HTN (hypertension) 06/27/2021   Malaise 05/25/2014   Routine history and physical examination of adult 05/25/2014   Obesity 05/25/2014   Rondel Baton, MS, CCC-SLP Speech-Language Pathologist  Arlana Lindau, CCC-SLP 11/29/2021, 12:39 PM  [place] [facility] MAIN [facility] SERVICES [address] [place], [place], [address] Phone: [phone]   Fax:  [phone]  Name: Robert Mccann MRN: [id number] Date of Birth: Sep 17, 1996

## 2021-12-04 ENCOUNTER — Ambulatory Visit: Payer: BC Managed Care – PPO | Attending: Infectious Diseases | Admitting: Occupational Therapy

## 2021-12-04 ENCOUNTER — Encounter: Payer: Self-pay | Admitting: Occupational Therapy

## 2021-12-04 ENCOUNTER — Other Ambulatory Visit: Payer: Self-pay

## 2021-12-04 DIAGNOSIS — M6281 Muscle weakness (generalized): Secondary | ICD-10-CM | POA: Insufficient documentation

## 2021-12-04 DIAGNOSIS — R41841 Cognitive communication deficit: Secondary | ICD-10-CM | POA: Insufficient documentation

## 2021-12-04 NOTE — Therapy (Signed)
[place] [facility] MAIN [facility] SERVICES [address] [place], [place], [address] Phone: [phone]   Fax:  [phone]  Occupational Therapy Treatment  Patient Details  Name: Robert Mccann MRN: [id number] Date of Birth: 06/24/1996 Referring Provider (OT): Stann Mainland. Sampson Goon   Encounter Date: 12/04/2021   OT End of Session - 12/04/21 1543     Visit Number 2    Number of Visits 7    Date for OT Re-Evaluation 02/06/22    OT Start Time 1515    OT Stop Time 1600    OT Time Calculation (min) 45 min    Activity Tolerance Patient tolerated treatment well    Behavior During Therapy [facility] for tasks assessed/performed             Past Medical History:  Diagnosis Date   AV block, Mobitz 2    a. 05/2021 noted during periods of sleep @ time of hospitalization for PE.   Cardiomyopathy (HCC)    a. 05/2021 Echo: EF <20%, glob HK. [place]:C ratio of 2.1:1 suggesting non-compaction. Gr2 DD. Sev reduced RV fxn. Nl PASP. Mildly dil RA. Mild MR.   Diabetes mellitus without complication (HCC)    HFrEF (heart failure with reduced ejection fraction) (HCC)    a. 05/2021 Echo: EF <20%. ? non-compaction.   Hypertension    Pulmonary embolism (HCC)    a. 05/2021 CTA Chest: PE extending from lobar arteris of RUL and RLL w/ concern for pulml infarct-->s/p thrombolysis and thrombectomy of RUL/RML/RLL.    Past Surgical History:  Procedure Laterality Date   HAND SURGERY Left age 53   HAND SURGERY     PULMONARY THROMBECTOMY N/A 06/28/2021   Procedure: PULMONARY THROMBECTOMY;  Surgeon: Annice Needy, MD;  Location: [facility] INVASIVE CV LAB;  Service: Cardiovascular;  Laterality: N/A;   TEE WITHOUT CARDIOVERSION N/A 11/08/2021   Procedure: TRANSESOPHAGEAL ECHOCARDIOGRAM (TEE);  Surgeon: Debbe Odea, MD;  Location: [facility] ORS;  Service: Cardiovascular;  Laterality: N/A;    There were no vitals filed for this visit.   Subjective Assessment - 12/04/21 1543     Subjective  Pt  reports feeling lightheaded this AM while helping his mother dress.    Pertinent History Pt is a 25 yo male with hx cardiomyopathy, heart failure with reduced EF, DM2, HTN, PE in June on eliquis who presents with 2-3 days of dysarthria and mild LUE weakness and was found to have a R MCA ischemic stroke    Limitations L neglect    Currently in Pain? No/denies            Therapeutic Activities:  Pt. worked on completing visual scanning, and visual search strategies for organized and random, unorganized visual searches. Pt. was able to complete them each efficiently with with 4-5 omissions each on the left, and right sides of the pages. Pt. worked on visual scanning tasks while standing at the [employer].  Self-care:   Pt. worked on Financial planner 4 sentences each. Pt. worked on Diplomatic Services operational officer in printed form on blank paper. Pt. then worked on formulating words in designated spaces on the paper challenging smaller written words.   Pt. completed the visual scanning, and visual search tasks efficiently with 4-5 omissions on each task. Pt. completed the visual search tasks at the whiteboard independently, however crossed over the line several times on each task. Pt. Was able to formulated the printed sentences with 75% legibility with positive deviation below the line. Pt. Was able to adjust  the size of the written words to fit in the designated small graph spaces. Pt. Continues to work on improving visual compensatory strategies in order to work towards improving, and maximizing independence with writing, and typing tasks.                      OT Education - 12/04/21 1543     Education Details HEP, visual scanning strategies    Person(s) Educated Patient    Methods Explanation;Demonstration    Comprehension Verbalized understanding;Returned demonstration              OT Short Term Goals - 11/28/21 1345       OT SHORT TERM GOAL #1   Title Pt will verbalize  plan to implement 3/3 visual scanning strategies for IADL tasks    Baseline Eval: unaware of strategies    Time 3    Period Weeks    Status New    Target Date 12/19/21               OT Long Term Goals - 11/28/21 1351       OT LONG TERM GOAL #1   Title Pt will improve L grip strength by 5 lbs or more for preferred leisure activity of bowling    Baseline Eval: 110#    Time 6    Period Weeks    Status New    Target Date 01/09/22      OT LONG TERM GOAL #2   Title Pt will improve L 9 PEG test by 10 seconds to improve coordination for preferred leisure activity of video games    Baseline Eval: 32 sec    Time 6    Period Weeks    Status New    Target Date 01/09/22      OT LONG TERM GOAL #3   Title Pt will write paragraph with 100% legibility    Baseline Eval: writes 1 sentence in 30 seconds with 90% legibility and poor letter spacing    Time 6    Period Weeks    Status New    Target Date 01/09/22      OT LONG TERM GOAL #4   Title Pt will type paragraph in 20 wpm with 90% accuracy or greater.    Baseline Eval: types paragraph in 11 wpm with 99% accuracy.    Time 6    Period Weeks    Status New    Target Date 01/09/22                   Plan - 12/04/21 1545     Clinical Impression Statement Pt. completed the visual scanning, and visual search tasks efficiently with 4-5 omissions on each task. Pt. completed the visual search tasks at the whiteboard independently, however crossed over the line several times on each task. Pt. Was able to formulated the printed sentences with 75% legibility with positive deviation below the line. Pt. Was able to adjust the size of the written words to fit in the designated small graph spaces. Pt. Continues to work on improving visual compensatory strategies in order to work towards improving, and maximizing independence with writing, and typing tasks.       OT Occupational Profile and History Detailed Assessment- Review of Records  and additional review of physical, cognitive, psychosocial history related to current functional performance    Occupational performance deficits (Please refer to evaluation for details): IADL's;Leisure    Body Structure / Function /  Physical Skills Coordination;FMC;IADL;Vision    Rehab Potential Good    Clinical Decision Making Several treatment options, min-mod task modification necessary    Comorbidities Affecting Occupational Performance: None    Modification or Assistance to Complete Evaluation  No modification of tasks or assist necessary to complete eval    OT Frequency 1x / week    OT Duration 6 weeks    OT Treatment/Interventions Self-care/ADL training;Therapeutic activities;Therapeutic exercise;Coping strategies training;Visual/perceptual remediation/compensation;Patient/family education    Consulted and Agree with Plan of Care Patient             Patient will benefit from skilled therapeutic intervention in order to improve the following deficits and impairments:   Body Structure / Function / Physical Skills: Coordination, FMC, IADL, Vision       Visit Diagnosis: Muscle weakness (generalized)  Cognitive communication deficit    Problem List Patient Active Problem List   Diagnosis Date Noted   Cerebrovascular accident (CVA) (HCC) 11/05/2021   PE (pulmonary thromboembolism) (HCC) 06/28/2021   HFrEF (heart failure with reduced ejection fraction) (HCC)    Acute pulmonary embolism with acute cor pulmonale (HCC)    Obesity, Class III, BMI 40-49.9 (morbid obesity) (HCC)    DM II (diabetes mellitus, type II), controlled (HCC) 06/27/2021   Chest pain 06/27/2021   HTN (hypertension) 06/27/2021   Malaise 05/25/2014   Routine history and physical examination of adult 05/25/2014   Obesity 05/25/2014    Olegario Messier, MS, OTR/L 12/04/2021, 5:03 PM  [place] [facility] MAIN [facility] SERVICES [address] [place], [place], [address] Phone:  [phone]   Fax:  [phone]  Name: Hanson Medeiros MRN: [id number] Date of Birth: [birth year]

## 2021-12-05 ENCOUNTER — Encounter: Payer: BC Managed Care – PPO | Admitting: Speech Pathology

## 2021-12-05 ENCOUNTER — Ambulatory Visit: Payer: BC Managed Care – PPO | Admitting: Occupational Therapy

## 2021-12-11 ENCOUNTER — Ambulatory Visit: Payer: BC Managed Care – PPO | Admitting: Speech Pathology

## 2021-12-11 ENCOUNTER — Encounter: Payer: BC Managed Care – PPO | Admitting: Occupational Therapy

## 2021-12-11 ENCOUNTER — Encounter: Payer: BC Managed Care – PPO | Admitting: Speech Pathology

## 2021-12-12 ENCOUNTER — Ambulatory Visit: Payer: BC Managed Care – PPO | Admitting: Occupational Therapy

## 2021-12-12 ENCOUNTER — Ambulatory Visit: Payer: BC Managed Care – PPO | Admitting: Speech Pathology

## 2021-12-14 ENCOUNTER — Encounter: Payer: BC Managed Care – PPO | Admitting: Speech Pathology

## 2021-12-14 ENCOUNTER — Encounter: Payer: BC Managed Care – PPO | Admitting: Occupational Therapy

## 2021-12-18 ENCOUNTER — Ambulatory Visit: Payer: BC Managed Care – PPO | Admitting: Occupational Therapy

## 2021-12-18 ENCOUNTER — Encounter: Payer: BC Managed Care – PPO | Admitting: Occupational Therapy

## 2021-12-18 ENCOUNTER — Encounter: Payer: BC Managed Care – PPO | Admitting: Speech Pathology

## 2021-12-19 ENCOUNTER — Ambulatory Visit: Payer: BC Managed Care – PPO | Admitting: Occupational Therapy

## 2021-12-20 ENCOUNTER — Encounter: Payer: BC Managed Care – PPO | Admitting: Speech Pathology

## 2021-12-26 ENCOUNTER — Ambulatory Visit: Payer: BC Managed Care – PPO | Admitting: Speech Pathology

## 2021-12-27 ENCOUNTER — Encounter: Payer: BC Managed Care – PPO | Admitting: Occupational Therapy

## 2021-12-27 ENCOUNTER — Encounter: Payer: BC Managed Care – PPO | Admitting: Speech Pathology

## 2021-12-27 ENCOUNTER — Encounter: Payer: Self-pay | Admitting: Speech Pathology

## 2021-12-27 NOTE — Therapy (Signed)
SPEECH THERAPY DISCHARGE SUMMARY  Visits from Start of Care: 1  Current functional level related to goals / functional outcomes: Goals not met; pt did not return after evaluation. Pt no-showed for SLP appointments on 12/13 and 12/27/21 despite calls from front office did not return for treatment. He is discharged today per clinic no-show policy and was informed by front office that new referral is necessary to return.    Remaining deficits: See evaluation for details.    Education / Equipment: Deficit areas, proposed therapy plan   Patient agrees to discharge. Patient goals were not met. Patient is being discharged due to not returning since the last visit.Marland Kitchen       SLP Short Term Goals - 12/27/21 0951       SLP SHORT TERM GOAL #1   Title Patient will complete standardized assessment of cognition, writing, and reading skills, with goals added PRN.    Time 10    Period --   sessions   Status Not Met      SLP SHORT TERM GOAL #2   Title Patient will demonstrate emergent awareness by identifying >85% of errors in cognitive-linguistic tasks with rare min cues for double-checking.    Time 10    Period --   sessions   Status Not Met      SLP SHORT TERM GOAL #3   Title Patient will reduce breakdowns to <3 in 8 minutes conversation using strategies for aphasia, apraxia.    Time 10    Period --   sessions   Status Not Met             SLP Long Term Goals - 12/27/21 0951       SLP LONG TERM GOAL #1   Title Patient will reduce breakdowns to <3 in 20 minutes conversation using strategies for aphasia, apraxia.    Time 12    Period Weeks    Status Not Met      SLP LONG TERM GOAL #2   Title Patient will use system for executive function and recall to manage appointments, medications, household tasks and finances >90% accuracy.    Time 12    Period Weeks    Status Not [place] MAIN [facility] SERVICES [address]  [place], [place], [address] Phone: [phone]   Fax:  [phone]  Patient Details  Name: Robert Mccann MRN: [id number] Date of Birth: March 28, 1996 Referring Provider:  No ref. provider found  Encounter Date: 12/27/2021  Deneise Lever, [place], [place], [place] 12/27/2021, 9:51 AM  [facility] [address] [place], [place], [address] Phone: [phone]   Fax:  [phone]

## 2022-01-02 ENCOUNTER — Ambulatory Visit: Payer: BC Managed Care – PPO | Admitting: Speech Pathology

## 2022-01-05 ENCOUNTER — Ambulatory Visit: Payer: BC Managed Care – PPO | Attending: Family | Admitting: Family

## 2022-01-05 ENCOUNTER — Encounter: Payer: Self-pay | Admitting: Family

## 2022-01-05 ENCOUNTER — Telehealth: Payer: Self-pay | Admitting: Internal Medicine

## 2022-01-05 ENCOUNTER — Other Ambulatory Visit: Payer: Self-pay

## 2022-01-05 VITALS — BP 140/97 | HR 97 | Resp 20 | Ht 72.0 in | Wt 323.0 lb

## 2022-01-05 DIAGNOSIS — I110 Hypertensive heart disease with heart failure: Secondary | ICD-10-CM | POA: Insufficient documentation

## 2022-01-05 DIAGNOSIS — E1121 Type 2 diabetes mellitus with diabetic nephropathy: Secondary | ICD-10-CM

## 2022-01-05 DIAGNOSIS — Z7901 Long term (current) use of anticoagulants: Secondary | ICD-10-CM | POA: Insufficient documentation

## 2022-01-05 DIAGNOSIS — I639 Cerebral infarction, unspecified: Secondary | ICD-10-CM

## 2022-01-05 DIAGNOSIS — Z79899 Other long term (current) drug therapy: Secondary | ICD-10-CM | POA: Insufficient documentation

## 2022-01-05 DIAGNOSIS — E119 Type 2 diabetes mellitus without complications: Secondary | ICD-10-CM | POA: Insufficient documentation

## 2022-01-05 DIAGNOSIS — R5383 Other fatigue: Secondary | ICD-10-CM | POA: Insufficient documentation

## 2022-01-05 DIAGNOSIS — Z86711 Personal history of pulmonary embolism: Secondary | ICD-10-CM | POA: Insufficient documentation

## 2022-01-05 DIAGNOSIS — I10 Essential (primary) hypertension: Secondary | ICD-10-CM

## 2022-01-05 DIAGNOSIS — I2699 Other pulmonary embolism without acute cor pulmonale: Secondary | ICD-10-CM | POA: Insufficient documentation

## 2022-01-05 DIAGNOSIS — I5022 Chronic systolic (congestive) heart failure: Secondary | ICD-10-CM | POA: Insufficient documentation

## 2022-01-05 DIAGNOSIS — Z8673 Personal history of transient ischemic attack (TIA), and cerebral infarction without residual deficits: Secondary | ICD-10-CM | POA: Insufficient documentation

## 2022-01-05 DIAGNOSIS — I5020 Unspecified systolic (congestive) heart failure: Secondary | ICD-10-CM

## 2022-01-05 NOTE — Progress Notes (Signed)
Patient ID: Robert Mccann, male    DOB: 1996-04-05, 26 y.o.   MRN: [id number]  HPI  Robert Mccann is a 26 y/o male with a history of AV block Mobitz 2, DM, HTN, stroke, pulmonary embolus and chronic heart failure.   Echo report from 11/08/21 reviewed and showed an EF of 20-25% without thrombus  Admitted 11/05/21 due to HA, blurry vision and expressive dysphasia. MRI of the brain showed a large right MCA stroke. Cardiology and neurology consults obtained. Discharged after 4 days.   He presents today for his initial visit with a chief complaint of moderate fatigue upon minimal exertion. He says that this has been chronic in nature having been present for several years. He has associated decreased appetite, visual deficit & palpitations along with this. He denies any difficulty sleeping, dizziness, cough, shortness of breath, chest pain, pedal edema, abdominal distention or weight gain.   He didn't bring his medications and can not state with confidence what he takes.   Was admitted with a stroke in November and has had no follow-up with cardiology, neurology, PCP or hematology.   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 05/2021 noted during periods of sleep @ time of hospitalization for PE.   Cardiomyopathy ([place])    a. 05/2021 Echo: EF <20%, glob HK. [place]:C ratio of 2.1:1 suggesting non-compaction. Gr2 DD. Sev reduced RV fxn. Nl PASP. Mildly dil RA. Mild Robert.   CHF (congestive heart failure) (HCC)    Diabetes mellitus without complication (HCC)    HFrEF (heart failure with reduced ejection fraction) ([place])    a. 05/2021 Echo: EF <20%. ? non-compaction.   Hypertension    Pulmonary embolism ([place])    a. 05/2021 CTA Chest: PE extending from lobar arteris of RUL and RLL w/ concern for pulml infarct-->s/p thrombolysis and thrombectomy of RUL/RML/RLL.   Stroke [facility])    Past Surgical History:  Procedure Laterality Date   HAND SURGERY Left age 38   HAND SURGERY      PULMONARY THROMBECTOMY N/A 06/28/2021   Procedure: PULMONARY THROMBECTOMY;  Surgeon: Algernon Huxley, MD;  Location: [facility] CV LAB;  Service: Cardiovascular;  Laterality: N/A;   TEE WITHOUT CARDIOVERSION N/A 11/08/2021   Procedure: TRANSESOPHAGEAL ECHOCARDIOGRAM (TEE);  Surgeon: Kate Sable, MD;  Location: [facility] ORS;  Service: Cardiovascular;  Laterality: N/A;   Family History  Problem Relation Age of Onset   Heart failure Mother    Cerebrovascular Accident Mother    Diabetes Father    Hypertension Father    Social History   Tobacco Use   Smoking status: Never   Smokeless tobacco: Never  Substance Use Topics   Alcohol use: Not Currently    Comment: special occasions   No Known Allergies Prior to Admission medications   Medication Sig Start Date End Date Taking? Authorizing Provider  Biotin 1000 MCG tablet Take 1 tablet (1 mg total) by mouth daily. Chew 1 gummy daily. 06/30/21  Yes Fritzi Mandes, MD  metFORMIN (GLUCOPHAGE) 500 MG tablet Take 1 tablet (500 mg total) by mouth 2 (two) times daily. 06/30/21  Yes Fritzi Mandes, MD  torsemide (DEMADEX) 20 MG tablet Take 2 tablets (40 mg total) by mouth 2 (two) times daily. 09/15/21  Yes Agbor-Etang, Aaron Edelman, MD  apixaban (ELIQUIS) 5 MG TABS tablet Take 5 mg by mouth 2 (two) times daily. Patient not taking: Reported on 01/05/2022    [provider]  atorvastatin (LIPITOR) 80 MG  tablet Take 1 tablet (80 mg total) by mouth daily. Patient not taking: Reported on 01/05/2022 11/10/21 12/10/21  Nolberto Hanlon, MD  carvedilol (COREG) 6.25 MG tablet Take 1 tablet (6.25 mg total) by mouth 2 (two) times daily with a meal. Patient not taking: Reported on 01/05/2022 06/30/21   Fritzi Mandes, MD  FARXIGA 10 MG TABS tablet Take 10 mg by mouth daily. Patient not taking: Reported on 01/05/2022 10/13/21   [provider]  Multiple Vitamin (ONE-A-DAY MENS PO) Take 1 tablet by mouth daily. Patient not taking: Reported on 11/28/2021    [provider]  sacubitril-valsartan (ENTRESTO) 24-26 MG Take 1 tablet by mouth 2 (two) times daily. Patient not taking: Reported on 01/05/2022 07/06/21   Theora Gianotti, NP  spironolactone (ALDACTONE) 25 MG tablet Take 0.5 tablets (12.5 mg total) by mouth daily. Patient not taking: Reported on 01/05/2022 09/15/21 12/14/21  Kate Sable, MD    Review of Systems  Constitutional:  Positive for appetite change (decreased) and fatigue (easily).  HENT:  Negative for congestion, postnasal drip and sore throat.   Eyes:  Positive for visual disturbance.  Respiratory:  Negative for cough, chest tightness and shortness of breath.   Cardiovascular:  Positive for palpitations (on occasion). Negative for chest pain and leg swelling.  Gastrointestinal:  Negative for abdominal distention and abdominal pain.  Endocrine: Negative.   Genitourinary: Negative.   Musculoskeletal:  Negative for back pain and neck pain.  Skin: Negative.   Allergic/Immunologic: Negative.   Neurological:  Negative for dizziness and light-headedness.  Hematological:  Negative for adenopathy. Does not bruise/bleed easily.  Psychiatric/Behavioral:  Negative for dysphoric mood and sleep disturbance (sleeping on 2 pillows). The patient is not nervous/anxious.    Vitals:   01/05/22 0933  BP: (!) 140/97  Pulse: 97  Resp: 20  SpO2: 99%  Weight: (!) 323 lb (146.5 kg)  Height: 6' (1.829 m)   Wt Readings from Last 3 Encounters:  01/05/22 (!) 323 lb (146.5 kg)  11/06/21 (!) 304 lb 7.3 oz (138.1 kg)  10/13/21 (!) 317 lb (143.8 kg)   Lab Results  Component Value Date   CREATININE 1.00 11/08/2021   CREATININE 0.91 11/07/2021   CREATININE 0.89 11/06/2021   Physical Exam Vitals and nursing note reviewed. Exam conducted with a chaperone present (girlfriend).  Constitutional:      Appearance: Normal appearance.  HENT:     Head: Normocephalic and atraumatic.  Cardiovascular:     Rate and Rhythm: Normal rate and regular rhythm.   Pulmonary:     Effort: Pulmonary effort is normal. No respiratory distress.     Breath sounds: No wheezing or rales.  Abdominal:     General: There is no distension.     Palpations: Abdomen is soft.  Musculoskeletal:        General: No tenderness.     Cervical back: Normal range of motion and neck supple.     Right lower leg: No edema.     Left lower leg: No edema.  Skin:    General: Skin is warm and dry.  Neurological:     General: No focal deficit present.     Mental Status: He is alert and oriented to person, place, and time.  Psychiatric:        Mood and Affect: Mood normal.        Behavior: Behavior normal.    Assessment & Plan:  1: Chronic heart failure with reduced ejection fraction- - NYHA class III -  euvolemic today - weighing daily; reminded to call for an overnight weight gain of > 2 pounds or a weekly weight gain of > 5 pounds - not adding salt but hasn't been monitoring sodium content lately; low sodium cookbook provided and encouraged to closely follow a 2000mg  sodium diet - he has no idea of his fluid intake but says that he's probably drinking "way too much"; reviewed how to measure fluid intake and to keep daily intake between 60-64 ounces - on his med list is spironolactone, entresto, farxiga & carvedilol but he doesn't know what or if he's taking these medications - did not have carvedilol f/u so this was scheduled for 01/17/22 with Ignacia Bayley - emphasized bringing his medication bottles to every visit every time - BNP 09/06/21 was 367.6  2: HTN- - BP mildly elevated (140/97) - saw PCP Ola Spurr) 09/06/21; needs f/u appt scheduled - BMP drawn 11/08/21 reviewed and showed sodium 133, potassium 3.5, creatinine 1.0 & GFR >60  3: DM- - not checking glucose often - A1c 11/06/21 was 8.7%  4: CVA- - stroke back in Nov but hasn't had neurology f/u - neuro appt scheduled for 01/18/22 with Cornelia Copa) - continues with visual field deficit of not having peripheral vision  on his left hand side  5: Pulmonary embolus- - has apixaban on med list but he doesn't know if he's taking it - was supposed to have hematology appt to evaluate a coagulation disorder but this wasn't scheduled - called [facility] to make the appointment and they said that they would call the patient to schedule this   Patient did not bring his medication bottles or med list and has no idea what he is taking. Does mention that his mom is on some of the same meds so he'll sometimes just take hers. RN tried 3 times to get through to [facility] in [place] and was hold for an extensive period of time and was unable to ever get through. Faxed request to pharmacy to send medication list.   Return in 3 weeks, sooner if needed.

## 2022-01-05 NOTE — Telephone Encounter (Signed)
Pt needs an appt for hospital follow up from 11-22. Call back at [phone]

## 2022-01-05 NOTE — Telephone Encounter (Signed)
Does this pt need to go back to you since they never showed up for f/u never been seen here I dont think.?

## 2022-01-05 NOTE — Patient Instructions (Addendum)
Continue weighing daily and call for an overnight weight gain of 3 pounds or more or a weekly weight gain of more than 5 pounds.    [facility] [address] [place], [place] [address] 01/18/22 at 1:00pm Nilda Calamity, PA

## 2022-01-08 ENCOUNTER — Encounter: Payer: BC Managed Care – PPO | Admitting: Occupational Therapy

## 2022-01-08 ENCOUNTER — Encounter: Payer: BC Managed Care – PPO | Admitting: Speech Pathology

## 2022-01-09 ENCOUNTER — Encounter: Payer: BC Managed Care – PPO | Admitting: Speech Pathology

## 2022-01-09 ENCOUNTER — Encounter: Payer: BC Managed Care – PPO | Admitting: Occupational Therapy

## 2022-01-15 ENCOUNTER — Encounter: Payer: BC Managed Care – PPO | Admitting: Speech Pathology

## 2022-01-15 ENCOUNTER — Encounter: Payer: BC Managed Care – PPO | Admitting: Occupational Therapy

## 2022-01-16 ENCOUNTER — Encounter: Payer: BC Managed Care – PPO | Admitting: Speech Pathology

## 2022-01-16 ENCOUNTER — Encounter: Payer: BC Managed Care – PPO | Admitting: Occupational Therapy

## 2022-01-17 ENCOUNTER — Encounter: Payer: Self-pay | Admitting: Nurse Practitioner

## 2022-01-17 ENCOUNTER — Other Ambulatory Visit: Payer: Self-pay

## 2022-01-17 ENCOUNTER — Ambulatory Visit (INDEPENDENT_AMBULATORY_CARE_PROVIDER_SITE_OTHER): Payer: Self-pay | Admitting: Nurse Practitioner

## 2022-01-17 VITALS — BP 122/70 | HR 102 | Ht 72.0 in | Wt 324.0 lb

## 2022-01-17 DIAGNOSIS — E66813 Obesity, class 3: Secondary | ICD-10-CM

## 2022-01-17 DIAGNOSIS — Z86711 Personal history of pulmonary embolism: Secondary | ICD-10-CM

## 2022-01-17 DIAGNOSIS — G4730 Sleep apnea, unspecified: Secondary | ICD-10-CM

## 2022-01-17 DIAGNOSIS — R072 Precordial pain: Secondary | ICD-10-CM

## 2022-01-17 DIAGNOSIS — E785 Hyperlipidemia, unspecified: Secondary | ICD-10-CM

## 2022-01-17 DIAGNOSIS — I441 Atrioventricular block, second degree: Secondary | ICD-10-CM

## 2022-01-17 DIAGNOSIS — I639 Cerebral infarction, unspecified: Secondary | ICD-10-CM

## 2022-01-17 DIAGNOSIS — I5020 Unspecified systolic (congestive) heart failure: Secondary | ICD-10-CM

## 2022-01-17 DIAGNOSIS — E1121 Type 2 diabetes mellitus with diabetic nephropathy: Secondary | ICD-10-CM

## 2022-01-17 DIAGNOSIS — I10 Essential (primary) hypertension: Secondary | ICD-10-CM

## 2022-01-17 MED ORDER — SPIRONOLACTONE 25 MG PO TABS: 12.5000 mg | ORAL_TABLET | Freq: Every day | ORAL | 3 refills | Status: DC

## 2022-01-17 MED ORDER — CARVEDILOL 6.25 MG PO TABS: 6.2500 mg | ORAL_TABLET | Freq: Two times a day (BID) | ORAL | 3 refills | Status: DC

## 2022-01-17 MED ORDER — TORSEMIDE 40 MG PO TABS: 40.0000 mg | ORAL_TABLET | Freq: Two times a day (BID) | ORAL | 3 refills | Status: DC

## 2022-01-17 MED ORDER — METOPROLOL TARTRATE 50 MG PO TABS: 50.0000 mg | ORAL_TABLET | Freq: Once | ORAL | 0 refills | Status: DC

## 2022-01-17 MED ORDER — ATORVASTATIN CALCIUM 80 MG PO TABS: 80.0000 mg | ORAL_TABLET | Freq: Every day | ORAL | 3 refills | Status: DC

## 2022-01-17 MED ORDER — FARXIGA 10 MG PO TABS: 10.0000 mg | ORAL_TABLET | Freq: Every day | ORAL | 3 refills | Status: DC

## 2022-01-17 MED ORDER — SACUBITRIL-VALSARTAN 24-26 MG PO TABS: 1.0000 | ORAL_TABLET | Freq: Two times a day (BID) | ORAL | 3 refills | Status: DC

## 2022-01-17 NOTE — Progress Notes (Signed)
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  Past Medical History    Past Medical History:  Diagnosis Date   AV block, Mobitz 2    a. 05/2021 noted during periods of sleep @ time of hospitalization for PE.   Cardiomyopathy ([place])    a. 05/2021 Echo: EF <20%, glob HK. Martin:C ratio of 2.1:1 suggesting non-compaction. Gr2 DD. Sev reduced RV fxn. Nl PASP. Mildly dil RA. Mild MR; b. 10/2021 Echo: EF <20%, GrII DD; c. 10/2021 TEE: EF 20-25%, glob HK, sev red RV fxn.   Diabetes mellitus without complication ([place])    HFrEF (heart failure with reduced ejection fraction) ([place])    a. 05/2021 Echo: EF <20%. ? non-compaction.   Hypertension    Pulmonary embolism ([place])    a. 05/2021 CTA Chest: PE extending from lobar arteris of RUL and RLL w/ concern for pulml infarct-->s/p thrombolysis and thrombectomy of RUL/RML/RLL.   Stroke [facility])    a. 10/2021 CT Head: Bilat cerebral infarcts w/ mod-sided acute to subacute R temporoparietal infarct; b. 10/2021 MRI: R post PCA territory infarcts w/o hemorrhage; c. 10/2021 TEE: Neg bubble study. No LA/LAA thrombus. EF 20-25%, glob HK.   Past Surgical History:  Procedure Laterality Date   HAND SURGERY Left age 33   HAND SURGERY     PULMONARY THROMBECTOMY N/A 06/28/2021   Procedure: PULMONARY THROMBECTOMY;  Surgeon: Algernon Huxley, MD;  Location: [place] [facility];  Service: Cardiovascular;  Laterality: N/A;   TEE WITHOUT CARDIOVERSION N/A 11/08/2021   Procedure: TRANSESOPHAGEAL ECHOCARDIOGRAM (TEE);  Surgeon: Kate Sable, MD;  Location: [facility] ORS;  Service: Cardiovascular;   Laterality: N/A;    Allergies  No Known Allergies  History of Present Illness    26 year old male with above past medical history including hypertension, diabetes, obesity, PE, 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  He initially presented to [facility] in June 2022 with chest pain and dyspnea.  CT of the chest revealed pulmonary emboli extending from the lobar arteries of the right upper and lower lobes and throughout their segmental branches.  He subsequently underwent thrombectomy and thrombolysis of the right upper/middle/lower lobes.  He was treated with heparin and subsequently Eliquis.  Lower extremity ultrasound was negative for DVT though it was noted that the patient previously spent 12 hours each way to [place] and back about a week or 2 prior to admission.  In the setting of work-up for PE, echocardiogram was performed and showed an EF of less than 20% with global hypokinesis.  Grade 2 diastolic dysfunction and severely reduced RV function were also noted.  There was concern for left ventricular noncompaction with recommendation for CMR as an outpatient.  On telemetry during hospitalization, he was also noted to have intermittent Mobitz 2 heart block during periods of sleep with recommendation for outpatient sleep study.  He was placed on heart failure medications including low-dose carvedilol, Entresto, and Farxiga.  At follow-up in July 2022, he was taking only carvedilol, Eliquis, and low-dose Lasix, as he was not able to afford [place] or Entresto.  At that visit, he was provided with  Entresto samples and a co-pay reduction card.  At September follow-up, he was placed on spironolactone and Lasix was switched to torsemide.  Unfortunately, in early November, Robert Mccann was admitted to [facility] with a 2 to 3-day history of dysarthria and left hand numbness/weakness.  He was found to have a large  acute posterior right MCA territory infarct.  TEE during admission did not show any LA/LAA/LV thrombus, and bubble study was negative.  He reported compliance with Eliquis, which was held in the setting of areas of petechial hemorrhage on follow-up CT.  We discussed potentially switching to warfarin however, patient preferred to remain on Eliquis, and this was resumed once cleared by neurology.  Since discharge, he has done well from a cardiac standpoint.  He continues to struggle with some aphasia and stumbling over his words, as well as left-sided visual neglect.  Since June, his weight is down 41 pounds.  He denies chest pain, dyspnea, palpitations, PND, orthopnea, dizziness, syncope, edema, or early satiety.  Home Medications    Current Outpatient Medications  Medication Sig Dispense Refill   apixaban (ELIQUIS) 5 MG TABS tablet Take 5 mg by mouth 2 (two) times daily.     Biotin 1000 MCG tablet Take 1 tablet (1 mg total) by mouth daily. Chew 1 gummy daily. 30 tablet 3   metFORMIN (GLUCOPHAGE) 500 MG tablet Take 1 tablet (500 mg total) by mouth 2 (two) times daily. 60 tablet 3   metoprolol tartrate (LOPRESSOR) 50 MG tablet Take 1 tablet (50 mg total) by mouth once for 1 dose. Take 2 hours prior to your CT. 1 tablet 0   Multiple Vitamin (ONE-A-DAY MENS PO) Take 1 tablet by mouth daily.     atorvastatin (LIPITOR) 80 MG tablet Take 1 tablet (80 mg total) by mouth daily. 90 tablet 3   carvedilol (COREG) 6.25 MG tablet Take 1 tablet (6.25 mg total) by mouth 2 (two) times daily with a meal. 180 tablet 3   FARXIGA 10 MG TABS tablet Take 1 tablet (10 mg total) by mouth daily. 90 tablet 3   sacubitril-valsartan (ENTRESTO) 24-26 MG Take 1 tablet by mouth 2 (two) times daily. 180 tablet 3   spironolactone (ALDACTONE) 25 MG tablet Take 0.5 tablets (12.5 mg total) by mouth daily. 45 tablet 3   torsemide 40 MG TABS Take 40 mg by mouth 2 (two) times daily. 90 tablet 3   No current facility-administered  medications for this visit.     Review of Systems    Ongoing intermittent aphasia and left-sided visual neglect.  He denies chest pain, palpitations, dyspnea, PND, orthopnea, dizziness, syncope, edema, or early satiety.  All other systems reviewed and are otherwise negative except as noted above.    Physical Exam    VS:  BP 122/70 (BP Location: Left Arm, Patient Position: Sitting, Cuff Size: Large)    Pulse (!) 102    Ht 6' (1.829 m)    Wt (!) 324 lb (147 kg)    SpO2 97%    BMI 43.94 kg/m  , BMI Body mass index is 43.94 kg/m.     GEN: Obese, in no acute distress. HEENT: normal. Neck: Supple, obese, difficult to gauge JVP.  No bruits or masses.   Cardiac: RRR, no murmurs, rubs, or gallops. No clubbing, cyanosis, edema.  Radials/PT 2+ and equal bilaterally.  Respiratory:  Respirations regular and unlabored, clear to auscultation bilaterally. GI: Obese, soft, nontender, nondistended, BS + x 4. MS: no deformity or atrophy. Skin: warm  and dry, no rash. Neuro:  Strength and sensation are intact. Psych: Normal affect.  Accessory Clinical Findings    ECG personally reviewed by me today -sinus tachycardia, 102, LVH, nonspecific ST/T changes- no acute changes.  Lab Results  Component Value Date   WBC 13.9 (H) 11/07/2021   HGB 16.9 11/07/2021   HCT 49.2 11/07/2021   MCV 86.8 11/07/2021   PLT 326 11/07/2021   Lab Results  Component Value Date   CREATININE 1.00 11/08/2021   BUN 22 (H) 11/08/2021   NA 133 (L) 11/08/2021   K 3.5 11/08/2021   CL 92 (L) 11/08/2021   CO2 31 11/08/2021   Lab Results  Component Value Date   ALT 19 11/05/2021   AST 31 11/05/2021   ALKPHOS 89 11/05/2021   BILITOT 1.2 11/05/2021   Lab Results  Component Value Date   CHOL 231 (H) 11/06/2021   HDL 51 11/06/2021   LDLCALC 151 (H) 11/06/2021   TRIG 143 11/06/2021   CHOLHDL 4.5 11/06/2021    Lab Results  Component Value Date   HGBA1C 8.7 (H) 11/06/2021    Assessment & Plan    1.   Cardiomyopathy/chronic HFrEF: Diagnosed with cardiomyopathy and LV dysfunction with an EF of 20% in July 2022, in the setting of admission for PE.  Echo concerning for left ventricular noncompaction.  He was admitted for stroke in November and is slowly recovering.  TEE during admission showed an EF of 20 to 25% with global hypokinesis and severely reduced RV function.  From a cardiac standpoint, he has done well without dyspnea, orthopnea, or edema.  His weight is down significantly since July.  He is euvolemic on examination today.  He has yet to undergo ischemic evaluation and I will arrange for coronary CTA to rule out obstructive disease.  As previously noted, we would also like for him to undergo cardiac MRI and perhaps with ongoing weight loss, this will be more feasible.  Ultimately, in the setting of ongoing LV dysfunction despite medical therapy, he will require EP evaluation.  He reports compliance with his medications but is in need of refills.  Continue carvedilol, Farxiga, Entresto, spironolactone, and Demadex.  Of note, heart rate elevated today however, he is yet to take his morning medications.  We discussed the importance of daily weights, sodium restriction, medication compliance, and symptom reporting and he verbalizes understanding.   2.  Right posterior PCA territory infarcts: Patient with admission in November for aphasia and left-sided weakness.  He continues to struggle with some aphasia and left-sided visual neglect but overall is improved since hospitalization.  TEE during hospitalization did not show any evidence of LA/LAA/LV thrombus.  Bubble study was negative.  He remains on Eliquis therapy in the setting of prior PE.  3.  History of pulmonary embolism: This was diagnosed in late June and he is status post thrombectomy and thrombolysis.  He remains on Eliquis therapy.  4.  Essential hypertension: Blood pressure stable.  Continue beta-blocker, Entresto, spironolactone, and current  dose of torsemide.  5.  Type 2 diabetes mellitus: A1c 8.7 in November.  He is on metformin and says he is also now taking [place].  Refilling for Zio today.  6.  Nocturnal Mobitz 2 heart block/?  Obstructive sleep apnea: This was noted during hospitalization back in June/July, during periods of sleep.  At that time, we set him up for an at home sleep study, though he has yet to activate the device as he is waiting  to hear from [place] regarding prior authorization.  Nursing staff will work on prior authorization so that we can complete this study.  7.  Morbid obesity: Patient encouraged to remain active with a focus on weight loss and calorie restriction.  8.  Disposition: Follow-up basic metabolic panel today.  Follow-up coronary CT angiography.  We will see if we can get that sleep study performed at home.  Follow-up in clinic in approximately 1 month at which time we can reconsider cardiac MRI and EP evaluation.  Murray Hodgkins, NP 01/17/2022, 1:57 PM

## 2022-01-17 NOTE — Patient Instructions (Signed)
Medication Instructions:  Your physician recommends that you continue on your current medications as directed. Please refer to the Current Medication list given to you today.   *If you need a refill on your cardiac medications before your next appointment, please call your pharmacy*   Lab Work:  Your provider has ordered labs (BMET) to be drawn in our office today.   If you have labs (blood work) drawn today and your tests are completely normal, you will receive your results only by: MyChart Message (if you have MyChart) OR A paper copy in the mail If you have any lab test that is abnormal or we need to change your treatment, we will call you to review the results.   Testing/Procedures: Your cardiac CT will be scheduled at:  [facility] [address] [place], [place] [address] [phone]  Please arrive 15 mins early for check-in and test prep.    Please follow these instructions carefully (unless otherwise directed):   On the Night Before the Test: Be sure to Drink plenty of water. Do not consume any caffeinated/decaffeinated beverages or chocolate 12 hours prior to your test.   On the Day of the Test: Drink plenty of water until 1 hour prior to the test. Do not eat any food 4 hours prior to the test. You may take your regular medications prior to the test.  Take metoprolol (Lopressor) two hours prior to test. FEMALES- please wear underwire-free bra if available, avoid dresses & tight clothing       After the Test: Drink plenty of water. After receiving IV contrast, you may experience a mild flushed feeling. This is normal. On occasion, you may experience a mild rash up to 24 hours after the test. This is not dangerous. If this occurs, you can take Benadryl 25 mg and increase your fluid intake. If you experience trouble breathing, this can be serious. If it is severe call 911 IMMEDIATELY. If it is mild, please call our  office. If you take any of these medications: Glipizide/Metformin, Avandament, Glucavance, please do not take 48 hours after completing test unless otherwise instructed.  Please allow 2-4 weeks for scheduling of routine cardiac CTs. Some insurance companies require a pre-authorization which may delay scheduling of this test.   For non-scheduling related questions, please contact the cardiac imaging nurse navigator should you have any questions/concerns: Robert Mccann, Cardiac Imaging Nurse Navigator Robert Mccann, Cardiac Imaging Nurse Navigator [place] [facility] Direct Office Dial: [phone]   For scheduling needs, including cancellations and rescheduling, please call Robert Mccann, [phone].     Follow-Up: At [facility], you and your health needs are our priority.  As part of our continuing mission to provide you with exceptional heart care, we have created designated Provider Care Teams.  These Care Teams include your primary Cardiologist (physician) and Advanced Practice Providers (APPs -  Physician Assistants and Nurse Practitioners) who all work together to provide you with the care you need, when you need it.  We recommend signing up for the patient portal called "MyChart".  Sign up information is provided on this After Visit Summary.  MyChart is used to connect with patients for Virtual Visits (Telemedicine).  Patients are able to view lab/test results, encounter notes, upcoming appointments, etc.  Non-urgent messages can be sent to your provider as well.   To learn more about what you can do with MyChart, go to [email or web address].    Your next appointment:  4-6 week(s)  The format for your next appointment:   In Person  Provider:   You may see Robert Odea, MD or one of the following Advanced Practice Providers on your designated Care Team:   Nicolasa Ducking, NP Eula Listen, PA-C Cadence Fransico Michael, PA-C:1}    Other Instructions N/A

## 2022-01-18 ENCOUNTER — Other Ambulatory Visit: Payer: Self-pay | Admitting: *Deleted

## 2022-01-18 DIAGNOSIS — I5020 Unspecified systolic (congestive) heart failure: Secondary | ICD-10-CM

## 2022-01-18 LAB — BASIC METABOLIC PANEL
BUN/Creatinine Ratio: 11 (ref 9–20)
BUN: 13 mg/dL (ref 6–20)
CO2: 21 mmol/L (ref 20–29)
Calcium: 10.3 mg/dL — ABNORMAL HIGH (ref 8.7–10.2)
Chloride: 98 mmol/L (ref 96–106)
Creatinine, Ser: 1.16 mg/dL (ref 0.76–1.27)
Glucose: 182 mg/dL — ABNORMAL HIGH (ref 70–99)
Potassium: 4.3 mmol/L (ref 3.5–5.2)
Sodium: 136 mmol/L (ref 134–144)
eGFR: 89 mL/min/{1.73_m2} (ref >59)

## 2022-01-22 ENCOUNTER — Encounter: Payer: BC Managed Care – PPO | Admitting: Speech Pathology

## 2022-01-22 ENCOUNTER — Encounter: Payer: BC Managed Care – PPO | Admitting: Occupational Therapy

## 2022-01-23 ENCOUNTER — Encounter: Payer: BC Managed Care – PPO | Admitting: Speech Pathology

## 2022-01-25 ENCOUNTER — Other Ambulatory Visit: Payer: Self-pay | Admitting: Nurse Practitioner

## 2022-01-25 DIAGNOSIS — I5020 Unspecified systolic (congestive) heart failure: Secondary | ICD-10-CM

## 2022-01-26 ENCOUNTER — Telehealth: Payer: Self-pay | Admitting: Family

## 2022-01-26 ENCOUNTER — Ambulatory Visit: Payer: Self-pay | Admitting: Family

## 2022-01-26 NOTE — Telephone Encounter (Signed)
Patient did not show for his Heart Failure Clinic appointment on 12/06/22. Will attempt to reschedule.   

## 2022-01-29 ENCOUNTER — Other Ambulatory Visit: Payer: Self-pay

## 2022-01-30 ENCOUNTER — Ambulatory Visit: Payer: Self-pay | Admitting: Family

## 2022-01-31 ENCOUNTER — Telehealth (HOSPITAL_COMMUNITY): Payer: Self-pay | Admitting: *Deleted

## 2022-01-31 ENCOUNTER — Other Ambulatory Visit (HOSPITAL_COMMUNITY): Payer: Self-pay | Admitting: *Deleted

## 2022-01-31 MED ORDER — IVABRADINE HCL 7.5 MG PO TABS: ORAL_TABLET | ORAL | 0 refills | Status: DC

## 2022-01-31 MED ORDER — IVABRADINE HCL 5 MG PO TABS: ORAL_TABLET | ORAL | 0 refills | Status: DC

## 2022-01-31 MED ORDER — METOPROLOL TARTRATE 100 MG PO TABS: ORAL_TABLET | ORAL | 0 refills | Status: DC

## 2022-01-31 NOTE — Telephone Encounter (Signed)
Reaching out to patient to offer assistance regarding upcoming cardiac imaging study; pt verbalizes understanding of appt date/time, parking situation and where to check in, pre-test NPO status and medications ordered, and verified current allergies; name and call back number provided for further questions should they arise  Grabiela Wohlford RN Navigator Cardiac Imaging [facility] [facility] [phone] office [phone] cell  Patient to take 100mg metoprolol tartrate and 15mg ivabradine two hours prior to his cardiac CT scan. 

## 2022-01-31 NOTE — Telephone Encounter (Signed)
Attempted to call patient regarding upcoming cardiac CT appointment. Left message on voicemail with name and callback number  Larey Brick RN Navigator Cardiac Imaging [facility] [phone] Office [phone] Cell  Sending 100mg  metoprolol tatrate and 15mg  ivabradine per Dr.Agbor-Etang for the test.

## 2022-02-01 ENCOUNTER — Ambulatory Visit
Admission: RE | Admit: 2022-02-01 | Discharge: 2022-02-01 | Disposition: A | Discharge status: Home / Self Care | Payer: 59 | Source: Ambulatory Visit | Attending: Nurse Practitioner | Admitting: Nurse Practitioner

## 2022-02-01 ENCOUNTER — Telehealth: Payer: Self-pay

## 2022-02-01 ENCOUNTER — Other Ambulatory Visit: Payer: Self-pay

## 2022-02-01 DIAGNOSIS — Z86711 Personal history of pulmonary embolism: Secondary | ICD-10-CM | POA: Diagnosis present

## 2022-02-01 MED ORDER — METOPROLOL TARTRATE 5 MG/5ML IV SOLN
10.0000 mg | Freq: Once | INTRAVENOUS | Status: AC
Start: 2022-02-01 — End: 2022-02-01
  Administered 2022-02-01: 10 mg via INTRAVENOUS

## 2022-02-01 MED ORDER — DILTIAZEM HCL 25 MG/5ML IV SOLN
10.0000 mg | Freq: Once | INTRAVENOUS | Status: AC
  Administered 2022-02-01: 10 mg via INTRAVENOUS

## 2022-02-01 MED ORDER — METOPROLOL TARTRATE 5 MG/5ML IV SOLN
10.0000 mg | Freq: Once | INTRAVENOUS | Status: AC
  Administered 2022-02-01: 10 mg via INTRAVENOUS

## 2022-02-01 MED ORDER — IOHEXOL 350 MG/ML SOLN: 100.0000 mL | Freq: Once | INTRAVENOUS | Status: DC | PRN

## 2022-02-01 MED ORDER — NITROGLYCERIN 0.4 MG SL SUBL: 0.8000 mg | SUBLINGUAL_TABLET | Freq: Once | SUBLINGUAL | Status: DC

## 2022-02-01 NOTE — Telephone Encounter (Signed)
Spoke with Dr. Azucena Cecil and he recommended we offer our DOD slot tomorrow to patient to discuss possible Cath Lab procedure. Patient was unable to be scanned today during his CCTA appointment due to elevated heart rate.  Called patient and informed him of recommendations. Patient agreed to come in tomorrow and was grateful for the follow up.

## 2022-02-01 NOTE — Progress Notes (Signed)
Patient sitting in recliner.  Medications given to decrease BP unsuccessfully.  Dr. Myriam Forehand aware. Patient to call office to follow up.

## 2022-02-02 ENCOUNTER — Ambulatory Visit: Payer: 59 | Admitting: Cardiology

## 2022-02-05 ENCOUNTER — Encounter: Payer: Self-pay | Admitting: Cardiology

## 2022-02-20 ENCOUNTER — Ambulatory Visit: Payer: 59 | Admitting: Nurse Practitioner

## 2022-02-20 NOTE — Progress Notes (Deleted)
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  Past Medical History    Past Medical History:  Diagnosis Date   AV block, Mobitz 2    a. 05/2021 noted during periods of sleep @ time of hospitalization for PE.   Cardiomyopathy ([place])    a. 05/2021 Echo: EF <20%, glob HK. [place]:C ratio of 2.1:1 suggesting non-compaction. Gr2 DD. Sev reduced RV fxn. Nl PASP. Mildly dil RA. Mild MR; b. 10/2021 Echo: EF <20%, GrII DD; c. 10/2021 TEE: EF 20-25%, glob HK, sev red RV fxn.   Diabetes mellitus without complication ([place])    HFrEF (heart failure with reduced ejection fraction) ([place])    a. 05/2021 Echo: EF <20%. ? non-compaction.   Hypertension    Pulmonary embolism ([place])    a. 05/2021 CTA Chest: PE extending from lobar arteris of RUL and RLL w/ concern for pulml infarct-->s/p thrombolysis and thrombectomy of RUL/RML/RLL.   Stroke [facility])    a. 10/2021 CT Head: Bilat cerebral infarcts w/ mod-sided acute to subacute R temporoparietal infarct; b. 10/2021 MRI: R post PCA territory infarcts w/o hemorrhage; c. 10/2021 TEE: Neg bubble study. No LA/LAA thrombus. EF 20-25%, glob HK.   Past Surgical History:  Procedure Laterality Date   HAND SURGERY Left age 35   HAND SURGERY     PULMONARY THROMBECTOMY N/A 06/28/2021   Procedure: PULMONARY THROMBECTOMY;  Surgeon: Algernon Huxley, MD;  Location: [place] [facility] LAB;  Service: Cardiovascular;  Laterality: N/A;   TEE WITHOUT CARDIOVERSION N/A 11/08/2021   Procedure: TRANSESOPHAGEAL ECHOCARDIOGRAM (TEE);  Surgeon: Kate Sable, MD;  Location: [facility] ORS;   Service: Cardiovascular;  Laterality: N/A;    Allergies  No Known Allergies  History of Present Illness    26 year old male with the above past medical history including hypertension, diabetes, obesity, pulmonary embolism, 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  He initially presented to [facility] in June 2022, with chest pain and dyspnea.  CT of the chest revealed pulmonary emboli extending from the lobar arteries of the right upper and lower lobes and throughout their segmental branches.  He underwent thrombectomy and thrombolysis of the right upper/middle/lower lobes.  He was treated with heparin and transition to Eliquis.  Lower extremity ultrasound was negative for DVT.  In the setting of work-up for pulmonary embolism, echocardiogram was performed and showed an EF of less than 20% with global hypokinesis.  Grade 2 diastolic dysfunction and severely reduced RV function were also noted.  There was concern for left ventricular noncompaction with recommendation for CMR as an outpatient.  On telemetry during hospitalization, he was noted to have intermittent Mobitz 2 heart block during periods of sleep with recommendation for outpatient sleep study.  Unfortunately, in early November 2022, he was admitted to [facility] with a 2 to 3-day history of dysarthria and left hand numbness/weakness.  He was found to have a large, acute posterior right MCA territory infarct.  TEE during admission did not show any LA/LAA/LV thrombus, and bubble study was negative.  Eliquis was initially held in the setting of petechial hemorrhage on follow-up CT.  In the  setting of stroke on Eliquis, discussion was had regarding switching him to warfarin however, he preferred to remain on Eliquis and this was resumed once cleared by neurology.  At office follow-up on January 18, patient was recovering reasonably well.  He continues to still experience some  expressive aphasia and left-sided visual neglect but his strength had improved.  We arrange for coronary CTA to rule out obstructive disease however, patient's heart rate was unable to be effectively lowered on the day of CT and it was canceled.  Dr. Garen Lah has recommended diagnostic patient.  Home Medications    Current Outpatient Medications  Medication Sig Dispense Refill   ivabradine (CORLANOR) 5 MG TABS tablet Take 15mg  (3 tablets) TWO hours prior to cardiac CT scan in addition to 100mg  of metoprolol tartrate. 3 tablet 0   apixaban (ELIQUIS) 5 MG TABS tablet Take 5 mg by mouth 2 (two) times daily.     atorvastatin (LIPITOR) 80 MG tablet Take 1 tablet (80 mg total) by mouth daily. 90 tablet 3   Biotin 1000 MCG tablet Take 1 tablet (1 mg total) by mouth daily. Chew 1 gummy daily. 30 tablet 3   carvedilol (COREG) 6.25 MG tablet Take 1 tablet (6.25 mg total) by mouth 2 (two) times daily with a meal. 180 tablet 3   FARXIGA 10 MG TABS tablet Take 1 tablet (10 mg total) by mouth daily. 90 tablet 3   metFORMIN (GLUCOPHAGE) 500 MG tablet Take 1 tablet (500 mg total) by mouth 2 (two) times daily. 60 tablet 3   metoprolol tartrate (LOPRESSOR) 100 MG tablet Take 1 tablet (100mg ) TWO hours prior to cardiac CT scan. 1 tablet 0   metoprolol tartrate (LOPRESSOR) 50 MG tablet Take 1 tablet (50 mg total) by mouth once for 1 dose. Take 2 hours prior to your CT. 1 tablet 0   Multiple Vitamin (ONE-A-DAY MENS PO) Take 1 tablet by mouth daily.     sacubitril-valsartan (ENTRESTO) 24-26 MG Take 1 tablet by mouth 2 (two) times daily. 180 tablet 3   spironolactone (ALDACTONE) 25 MG tablet Take 0.5 tablets (12.5 mg total) by mouth daily. 45 tablet 3   torsemide 40 MG TABS Take 40 mg by mouth 2 (two) times daily. 90 tablet 3   No current facility-administered medications for this visit.     Review of Systems    ***.  All other systems reviewed and are otherwise negative except as noted above.    Physical  Exam    VS:  There were no vitals taken for this visit. , BMI There is no height or weight on file to calculate BMI.     GEN: Well nourished, well developed, in no acute distress. HEENT: normal. Neck: Supple, no JVD, carotid bruits, or masses. Cardiac: RRR, no murmurs, rubs, or gallops. No clubbing, cyanosis, edema.  Radials/DP/PT 2+ and equal bilaterally.  Respiratory:  Respirations regular and unlabored, clear to auscultation bilaterally. GI: Soft, nontender, nondistended, BS + x 4. MS: no deformity or atrophy. Skin: warm and dry, no rash. Neuro:  Strength and sensation are intact. Psych: Normal affect.  Accessory Clinical Findings    ECG personally reviewed by me today - *** - no acute changes.  Lab Results  Component Value Date   WBC 13.9 (H) 11/07/2021   HGB 16.9 11/07/2021   HCT 49.2 11/07/2021   MCV 86.8 11/07/2021   PLT 326 11/07/2021   Lab Results  Component Value Date   CREATININE 1.16 01/17/2022   BUN  13 01/17/2022   NA 136 01/17/2022   K 4.3 01/17/2022   CL 98 01/17/2022   CO2 21 01/17/2022   Lab Results  Component Value Date   ALT 19 11/05/2021   AST 31 11/05/2021   ALKPHOS 89 11/05/2021   BILITOT 1.2 11/05/2021   Lab Results  Component Value Date   CHOL 231 (H) 11/06/2021   HDL 51 11/06/2021   LDLCALC 151 (H) 11/06/2021   TRIG 143 11/06/2021   CHOLHDL 4.5 11/06/2021    Lab Results  Component Value Date   HGBA1C 8.7 (H) 11/06/2021    Assessment & Plan    1.  ***   Murray Hodgkins, NP 02/20/2022, 8:33 AM

## 2022-02-21 ENCOUNTER — Encounter: Payer: Self-pay | Admitting: Nurse Practitioner

## 2022-03-07 ENCOUNTER — Telehealth: Payer: Self-pay

## 2022-03-07 ENCOUNTER — Other Ambulatory Visit: Payer: Self-pay

## 2022-03-07 DIAGNOSIS — I5020 Unspecified systolic (congestive) heart failure: Secondary | ICD-10-CM

## 2022-03-07 MED ORDER — TORSEMIDE 40 MG PO TABS: 40.0000 mg | ORAL_TABLET | Freq: Two times a day (BID) | ORAL | 0 refills | Status: DC

## 2022-03-07 NOTE — Telephone Encounter (Signed)
L MOM to call and schedule appointment that was no showed

## 2022-03-07 NOTE — Telephone Encounter (Signed)
-----   Message from Horton Finer sent at 03/07/2022  8:29 AM EST ----- Regarding: appt Please reschedule F/U appointment. Patient did not show for last scheduled office visit. Thank you!

## 2022-03-13 ENCOUNTER — Other Ambulatory Visit: Payer: Self-pay

## 2022-03-13 ENCOUNTER — Encounter: Payer: Self-pay | Admitting: *Deleted

## 2022-03-13 ENCOUNTER — Inpatient Hospital Stay
Admission: EM | Admit: 2022-03-13 | Discharge: 2022-03-16 | DRG: 280 | Disposition: A | Discharge status: Other Acute Inpatient Hospital | Payer: 59 | Attending: Family Medicine | Admitting: Family Medicine

## 2022-03-13 ENCOUNTER — Emergency Department: Payer: 59

## 2022-03-13 DIAGNOSIS — I252 Old myocardial infarction: Secondary | ICD-10-CM | POA: Diagnosis present

## 2022-03-13 DIAGNOSIS — Z8249 Family history of ischemic heart disease and other diseases of the circulatory system: Secondary | ICD-10-CM

## 2022-03-13 DIAGNOSIS — R7989 Other specified abnormal findings of blood chemistry: Secondary | ICD-10-CM | POA: Diagnosis present

## 2022-03-13 DIAGNOSIS — Z833 Family history of diabetes mellitus: Secondary | ICD-10-CM

## 2022-03-13 DIAGNOSIS — Z823 Family history of stroke: Secondary | ICD-10-CM

## 2022-03-13 DIAGNOSIS — E1141 Type 2 diabetes mellitus with diabetic mononeuropathy: Secondary | ICD-10-CM

## 2022-03-13 DIAGNOSIS — E669 Obesity, unspecified: Secondary | ICD-10-CM | POA: Diagnosis present

## 2022-03-13 DIAGNOSIS — E66813 Obesity, class 3: Secondary | ICD-10-CM | POA: Diagnosis present

## 2022-03-13 DIAGNOSIS — I5023 Acute on chronic systolic (congestive) heart failure: Secondary | ICD-10-CM

## 2022-03-13 DIAGNOSIS — Z86711 Personal history of pulmonary embolism: Secondary | ICD-10-CM | POA: Diagnosis present

## 2022-03-13 DIAGNOSIS — I441 Atrioventricular block, second degree: Secondary | ICD-10-CM | POA: Diagnosis present

## 2022-03-13 DIAGNOSIS — I5020 Unspecified systolic (congestive) heart failure: Secondary | ICD-10-CM | POA: Diagnosis present

## 2022-03-13 DIAGNOSIS — Z8673 Personal history of transient ischemic attack (TIA), and cerebral infarction without residual deficits: Secondary | ICD-10-CM

## 2022-03-13 DIAGNOSIS — E119 Type 2 diabetes mellitus without complications: Secondary | ICD-10-CM

## 2022-03-13 DIAGNOSIS — E1169 Type 2 diabetes mellitus with other specified complication: Secondary | ICD-10-CM | POA: Diagnosis present

## 2022-03-13 DIAGNOSIS — Z20822 Contact with and (suspected) exposure to covid-19: Secondary | ICD-10-CM | POA: Diagnosis present

## 2022-03-13 DIAGNOSIS — I214 Non-ST elevation (NSTEMI) myocardial infarction: Principal | ICD-10-CM | POA: Diagnosis present

## 2022-03-13 DIAGNOSIS — Z7901 Long term (current) use of anticoagulants: Secondary | ICD-10-CM

## 2022-03-13 DIAGNOSIS — I2699 Other pulmonary embolism without acute cor pulmonale: Secondary | ICD-10-CM

## 2022-03-13 DIAGNOSIS — I420 Dilated cardiomyopathy: Secondary | ICD-10-CM | POA: Diagnosis present

## 2022-03-13 DIAGNOSIS — I10 Essential (primary) hypertension: Secondary | ICD-10-CM | POA: Diagnosis present

## 2022-03-13 DIAGNOSIS — I2782 Chronic pulmonary embolism: Secondary | ICD-10-CM | POA: Diagnosis present

## 2022-03-13 DIAGNOSIS — E785 Hyperlipidemia, unspecified: Secondary | ICD-10-CM | POA: Diagnosis present

## 2022-03-13 DIAGNOSIS — Z6841 Body Mass Index (BMI) 40.0 and over, adult: Secondary | ICD-10-CM

## 2022-03-13 DIAGNOSIS — Z7984 Long term (current) use of oral hypoglycemic drugs: Secondary | ICD-10-CM

## 2022-03-13 DIAGNOSIS — I248 Other forms of acute ischemic heart disease: Secondary | ICD-10-CM | POA: Diagnosis present

## 2022-03-13 DIAGNOSIS — N179 Acute kidney failure, unspecified: Secondary | ICD-10-CM | POA: Diagnosis present

## 2022-03-13 DIAGNOSIS — T45516A Underdosing of anticoagulants, initial encounter: Secondary | ICD-10-CM | POA: Diagnosis present

## 2022-03-13 DIAGNOSIS — I513 Intracardiac thrombosis, not elsewhere classified: Secondary | ICD-10-CM | POA: Diagnosis present

## 2022-03-13 DIAGNOSIS — R531 Weakness: Secondary | ICD-10-CM | POA: Diagnosis not present

## 2022-03-13 DIAGNOSIS — Z79899 Other long term (current) drug therapy: Secondary | ICD-10-CM

## 2022-03-13 DIAGNOSIS — I110 Hypertensive heart disease with heart failure: Secondary | ICD-10-CM | POA: Diagnosis present

## 2022-03-13 DIAGNOSIS — Z91120 Patient's intentional underdosing of medication regimen due to financial hardship: Secondary | ICD-10-CM

## 2022-03-13 DIAGNOSIS — Z91199 Patient's noncompliance with other medical treatment and regimen due to unspecified reason: Secondary | ICD-10-CM

## 2022-03-13 LAB — BASIC METABOLIC PANEL
Anion gap: 7 (ref 5–15)
BUN: 15 mg/dL (ref 6–20)
CO2: 27 mmol/L (ref 22–32)
Calcium: 8.6 mg/dL — ABNORMAL LOW (ref 8.9–10.3)
Chloride: 96 mmol/L — ABNORMAL LOW (ref 98–111)
Creatinine, Ser: 1.21 mg/dL (ref 0.61–1.24)
GFR, Estimated: >60 mL/min (ref >60)
Glucose, Bld: 282 mg/dL — ABNORMAL HIGH (ref 70–99)
Potassium: 3.8 mmol/L (ref 3.5–5.1)
Sodium: 130 mmol/L — ABNORMAL LOW (ref 135–145)

## 2022-03-13 LAB — HEPATIC FUNCTION PANEL
ALT: 129 U/L — ABNORMAL HIGH (ref 0–44)
AST: 118 U/L — ABNORMAL HIGH (ref 15–41)
Albumin: 2.9 g/dL — ABNORMAL LOW (ref 3.5–5.0)
Alkaline Phosphatase: 119 U/L (ref 38–126)
Bilirubin, Direct: 0.4 mg/dL — ABNORMAL HIGH (ref 0.0–0.2)
Indirect Bilirubin: 0.7 mg/dL (ref 0.3–0.9)
Total Bilirubin: 1.1 mg/dL (ref 0.3–1.2)
Total Protein: 7.3 g/dL (ref 6.5–8.1)

## 2022-03-13 LAB — TROPONIN I (HIGH SENSITIVITY)
Troponin I (High Sensitivity): 22 ng/L — ABNORMAL HIGH (ref <18)
Troponin I (High Sensitivity): 24 ng/L — ABNORMAL HIGH (ref <18)

## 2022-03-13 LAB — CBC
HCT: 44.7 % (ref 39.0–52.0)
Hemoglobin: 14.6 g/dL (ref 13.0–17.0)
MCH: 28.8 pg (ref 26.0–34.0)
MCHC: 32.7 g/dL (ref 30.0–36.0)
MCV: 88.2 fL (ref 80.0–100.0)
Platelets: 356 10*3/uL (ref 150–400)
RBC: 5.07 MIL/uL (ref 4.22–5.81)
RDW: 12.5 % (ref 11.5–15.5)
WBC: 7.4 10*3/uL (ref 4.0–10.5)
nRBC: 0 % (ref 0.0–0.2)

## 2022-03-13 LAB — APTT: aPTT: 31 seconds (ref 24–36)

## 2022-03-13 LAB — PROTIME-INR
INR: 1.3 — ABNORMAL HIGH (ref 0.8–1.2)
Prothrombin Time: 15.7 seconds — ABNORMAL HIGH (ref 11.4–15.2)

## 2022-03-13 LAB — LIPASE, BLOOD: Lipase: 25 U/L (ref 11–51)

## 2022-03-13 LAB — CBG MONITORING, ED: Glucose-Capillary: 157 mg/dL — ABNORMAL HIGH (ref 70–99)

## 2022-03-13 LAB — HEPARIN LEVEL (UNFRACTIONATED): Heparin Unfractionated: <0.1 IU/mL — ABNORMAL LOW (ref 0.30–0.70)

## 2022-03-13 LAB — PROCALCITONIN: Procalcitonin: <0.1 ng/mL

## 2022-03-13 LAB — BRAIN NATRIURETIC PEPTIDE: B Natriuretic Peptide: 1083.1 pg/mL — ABNORMAL HIGH (ref 0.0–100.0)

## 2022-03-13 MED ORDER — ATORVASTATIN CALCIUM 80 MG PO TABS
80.0000 mg | ORAL_TABLET | Freq: Every day | ORAL | Status: DC
  Administered 2022-03-13 – 2022-03-16 (×4): 80 mg via ORAL
  Filled 2022-03-13: qty 1
  Filled 2022-03-13: qty 4
  Filled 2022-03-13: qty 1
  Filled 2022-03-13: qty 4

## 2022-03-13 MED ORDER — ONDANSETRON HCL 4 MG PO TABS: 4.0000 mg | ORAL_TABLET | Freq: Four times a day (QID) | ORAL | Status: DC | PRN

## 2022-03-13 MED ORDER — HEPARIN BOLUS VIA INFUSION
7000.0000 [IU] | Freq: Once | INTRAVENOUS | Status: AC
  Administered 2022-03-13: 7000 [IU] via INTRAVENOUS
  Filled 2022-03-13: qty 7000

## 2022-03-13 MED ORDER — SODIUM CHLORIDE 0.9 % IV BOLUS
1000.0000 mL | Freq: Once | INTRAVENOUS | Status: AC
  Administered 2022-03-13: 1000 mL via INTRAVENOUS

## 2022-03-13 MED ORDER — HEPARIN BOLUS VIA INFUSION
6000.0000 [IU] | Freq: Once | INTRAVENOUS | Status: DC
  Filled 2022-03-13: qty 6000

## 2022-03-13 MED ORDER — CARVEDILOL 6.25 MG PO TABS
6.2500 mg | ORAL_TABLET | Freq: Two times a day (BID) | ORAL | Status: DC
  Administered 2022-03-14 – 2022-03-16 (×5): 6.25 mg via ORAL
  Filled 2022-03-13 (×5): qty 1

## 2022-03-13 MED ORDER — ACETAMINOPHEN 325 MG PO TABS: 650.0000 mg | ORAL_TABLET | Freq: Four times a day (QID) | ORAL | Status: DC | PRN

## 2022-03-13 MED ORDER — METOCLOPRAMIDE HCL 5 MG/ML IJ SOLN
10.0000 mg | Freq: Once | INTRAMUSCULAR | Status: AC
  Administered 2022-03-13: 10 mg via INTRAVENOUS
  Filled 2022-03-13: qty 2

## 2022-03-13 MED ORDER — ONDANSETRON HCL 4 MG/2ML IJ SOLN
4.0000 mg | Freq: Four times a day (QID) | INTRAMUSCULAR | Status: DC | PRN
  Administered 2022-03-14 – 2022-03-15 (×3): 4 mg via INTRAVENOUS
  Filled 2022-03-13 (×3): qty 2

## 2022-03-13 MED ORDER — HEPARIN (PORCINE) 25000 UT/250ML-% IV SOLN
2300.0000 [IU]/h | INTRAVENOUS | Status: DC
  Administered 2022-03-13: 2100 [IU]/h via INTRAVENOUS
  Administered 2022-03-14 – 2022-03-16 (×3): 1900 [IU]/h via INTRAVENOUS
  Administered 2022-03-16: 2300 [IU]/h via INTRAVENOUS
  Filled 2022-03-13 (×6): qty 250

## 2022-03-13 MED ORDER — ACETAMINOPHEN 650 MG RE SUPP: 650.0000 mg | Freq: Four times a day (QID) | RECTAL | Status: DC | PRN

## 2022-03-13 MED ORDER — IOHEXOL 350 MG/ML SOLN
100.0000 mL | Freq: Once | INTRAVENOUS | Status: AC | PRN
  Administered 2022-03-13: 100 mL via INTRAVENOUS
  Filled 2022-03-13: qty 100

## 2022-03-13 MED ORDER — INSULIN ASPART 100 UNIT/ML IJ SOLN: 0.0000 [IU] | Freq: Every day | INTRAMUSCULAR | Status: DC

## 2022-03-13 MED ORDER — INSULIN ASPART 100 UNIT/ML IJ SOLN
0.0000 [IU] | Freq: Three times a day (TID) | INTRAMUSCULAR | Status: DC
  Administered 2022-03-14: 4 [IU] via SUBCUTANEOUS
  Administered 2022-03-14 – 2022-03-15 (×3): 7 [IU] via SUBCUTANEOUS
  Administered 2022-03-15: 4 [IU] via SUBCUTANEOUS
  Administered 2022-03-15: 5 [IU] via SUBCUTANEOUS
  Administered 2022-03-16: 11 [IU] via SUBCUTANEOUS
  Administered 2022-03-16: 4 [IU] via SUBCUTANEOUS
  Filled 2022-03-13 (×7): qty 1

## 2022-03-13 NOTE — ED Triage Notes (Addendum)
Pt reports vomiting for 1 month. Pt reports heart racing today.  No sob.  No chest pain.  Pt has fatigue.  Hx PE, off blood thinners for 2 weeks because pt ran out of medicine.    Pt alert  speech clear.   ?

## 2022-03-13 NOTE — ED Provider Notes (Signed)
? ?[facility] ?Provider Note ? ? ? Event Date/Time  ? First MD Initiated Contact with Patient 03/13/22 1548   ?  (approximate) ? ? ?History  ? ?Chief Complaint ?Emesis ? ? ?HPI ?Robert Mccann is a 26 y.o. male, history of type 2 diabetes, hypertension, HFrEF, PE, obesity, CVA, presents to the emergency department for evaluation of emesis and palpitations.  Patient states that he has been vomiting for the past month.  Patient states that every time that he eats anything, he vomits it back up within 10 to 15 minutes.  Additionally, patient states that his heart has been racing recently this past week.  He states that whenever he walks upstairs or walks for short distances, he is notably more short of breath than usual.  Additionally, he states that his left calf has been bothering him as well.  Denies fever/chills, chest pain, shortness of breath, abdominal pain, diarrhea, blood in stool, urinary symptoms, headache, dizziness, or rashes/lesions. ? ?History Limitations: No limitations. ? ?  ? ? ?Physical Exam  ?Triage Vital Signs: ?ED Triage Vitals  ?Enc Vitals Group  ?   BP 03/13/22 1525 (!) 127/95  ?   Pulse Rate 03/13/22 1525 (!) 113  ?   Resp 03/13/22 1525 20  ?   Temp 03/13/22 1525 (!) 97 ?F (36.1 ?C)  ?   Temp Source 03/13/22 1525 Oral  ?   SpO2 03/13/22 1525 98 %  ?   Weight 03/13/22 1526 (!) 325 lb (147.4 kg)  ?   Height 03/13/22 1526 6' (1.829 m)  ?   Head Circumference --   ?   Peak Flow --   ?   Pain Score 03/13/22 1525 0  ?   Pain Loc --   ?   Pain Edu? --   ?   Excl. in [place]? --   ? ? ?Most recent vital signs: ?Vitals:  ? 03/13/22 1525 03/13/22 1525  ?BP:  (!) 127/95  ?Pulse:  (!) 113  ?Resp:  20  ?Temp: (!) 97 ?F (36.1 ?C) 98 ?F (36.7 ?C)  ?SpO2:  98%  ? ? ?General: Awake, NAD.  ?CV: Good peripheral perfusion.  S1 and S2 present.  No murmurs rubs or gallops. ?Resp: Normal effort.  Lung sounds are clear bilaterally in the apices and bases. ?Abd: Soft, non-tender. No distention.   ?Neuro: At baseline. No gross neurological deficits.  ?Other: No significant swelling in the left lower extremity.  Negative Homans' sign.  Pulse, motor, sensation intact distally. ? ?Physical Exam ? ? ? ?ED Results / Procedures / Treatments  ?Labs ?(all labs ordered are listed, but only abnormal results are displayed) ?Labs Reviewed  ?BASIC METABOLIC PANEL - Abnormal; Notable for the following components:  ?    Result Value  ? Sodium 130 (*)   ? Chloride 96 (*)   ? Glucose, Bld 282 (*)   ? Calcium 8.6 (*)   ? All other components within normal limits  ?PROTIME-INR - Abnormal; Notable for the following components:  ? Prothrombin Time 15.7 (*)   ? INR 1.3 (*)   ? All other components within normal limits  ?BRAIN NATRIURETIC PEPTIDE - Abnormal; Notable for the following components:  ? B Natriuretic Peptide 1,083.1 (*)   ? All other components within normal limits  ?HEPATIC FUNCTION PANEL - Abnormal; Notable for the following components:  ? Albumin 2.9 (*)   ? AST 118 (*)   ? ALT 129 (*)   ? Bilirubin,  Direct 0.4 (*)   ? All other components within normal limits  ?HEPARIN LEVEL (UNFRACTIONATED) - Abnormal; Notable for the following components:  ? Heparin Unfractionated <0.10 (*)   ? All other components within normal limits  ?TROPONIN I (HIGH SENSITIVITY) - Abnormal; Notable for the following components:  ? Troponin I (High Sensitivity) 22 (*)   ? All other components within normal limits  ?TROPONIN I (HIGH SENSITIVITY) - Abnormal; Notable for the following components:  ? Troponin I (High Sensitivity) 24 (*)   ? All other components within normal limits  ?CBC  ?LIPASE, BLOOD  ?APTT  ?PROCALCITONIN  ?HEPARIN LEVEL (UNFRACTIONATED)  ?APTT  ?CBC  ?BASIC METABOLIC PANEL  ?HEMOGLOBIN A1C  ? ? ? ?EKG ?Sinus tachycardia, rate 114, no remarkable ST segment changes, rightward axis deviation noted, biatrial enlargement noted, normal QRS interval. ? ? ?RADIOLOGY ? ?ED Provider Interpretation: I personally reviewed and  interpreted these images.  Chest x-ray shows no evidence of acute cardiopulmonary process.  CT angio shows evidence of what appears to be a thrombus in the right atrium, as well as findings concerning for PE.  Ultrasound shows no evidence of DVT. ? ?DG Chest 2 View ? ?Result Date: 03/13/2022 ?CLINICAL DATA:  Weakness, fatigue EXAM: CHEST - 2 VIEW COMPARISON:  Prior chest x-ray 06/27/2021 FINDINGS: Similar degree of marked enlargement of the cardiopericardial silhouette. Pulmonary vascular congestion is present without overt edema. No focal airspace infiltrate, pleural effusion or pneumothorax. No acute osseous abnormality. IMPRESSION: Significant cardiomegaly with moderate pulmonary vascular congestion but no overt edema. Findings are similar compared to prior imaging from June of 2022. No definite acute cardiopulmonary process. Electronically Signed   By: Jacqulynn Cadet M.D.   On: 03/13/2022 15:48  ? ?CT Angio Chest PE W/Cm &/Or Wo Cm ? ?Result Date: 03/13/2022 ?CLINICAL DATA:  Heart racing EXAM: CT ANGIOGRAPHY CHEST WITH CONTRAST TECHNIQUE: Multidetector CT imaging of the chest was performed using the standard protocol during bolus administration of intravenous contrast. Multiplanar CT image reconstructions and MIPs were obtained to evaluate the vascular anatomy. RADIATION DOSE REDUCTION: This exam was performed according to the departmental dose-optimization program which includes automated exposure control, adjustment of the mA and/or kV according to patient size and/or use of iterative reconstruction technique. CONTRAST:  175mL OMNIPAQUE IOHEXOL 350 MG/ML SOLN COMPARISON:  CT chest angiogram dated July 27, 2021 FINDINGS: Cardiovascular: Adequate contrast opacification of the central pulmonary arteries. Pulmonary embolus of a segmental left upper lobe pulmonary artery, new when compared to prior exam. Additional linear filling defects are seen in bilateral subsegmental at areas of previous pulmonary embolus,  likely chronic.Cardiomegaly. No pericardial effusion.New rounded filling defect arising from the wall of the right atrium. No CT evidence of right heart strain. Mediastinum/Nodes: Esophagus and thyroid are unremarkable. No pathologically enlarged lymph nodes seen in the chest. Lungs/Pleura: Central airways are patent. No consolidation, pleural effusion or pneumothorax. Upper Abdomen: No acute abnormality. Musculoskeletal: No chest wall abnormality. No acute or significant osseous findings. Review of the MIP images confirms the above findings. IMPRESSION: 1. Pulmonary embolus of a segmental left upper lobe pulmonary artery, new when compared to prior exam. Additional linear filling defects are seen in bilateral subsegmental at areas of previous pulmonary embolus, likely chronic. 2. New rounded filling defect arising from the wall of the right atrium, concerning for right atrial thrombus. Recommend echocardiography for further evaluation. 3. Small peribronchovascular ground-glass opacity of the left upper lobe, likely infectious or inflammatory. 4. Cardiomegaly. Critical Value/emergent results were called by  telephone at the time of interpretation on 03/13/2022 at 5:19 pm to provider [facility] , who verbally acknowledged these results. Electronically Signed   By: Yetta Glassman M.D.   On: 03/13/2022 17:21  ? ?US Venous Img Lower  Left (DVT Study) ? ?Result Date: 03/13/2022 ?CLINICAL DATA:  Calf pain x1 month EXAM: LEFT LOWER EXTREMITY VENOUS DOPPLER ULTRASOUND TECHNIQUE: Gray-scale sonography with compression, as well as color and duplex ultrasound, were performed to evaluate the deep venous system(s) from the level of the common femoral vein through the popliteal and proximal calf veins. COMPARISON:  None. FINDINGS: VENOUS Normal compressibility of the common femoral, superficial femoral, and popliteal veins, as well as the visualized calf veins. Visualized portions of profunda femoral vein and great saphenous  vein unremarkable. No filling defects to suggest DVT on grayscale or color Doppler imaging. Doppler waveforms show normal direction of venous flow, normal respiratory plasticity and response to augmentation. Limited

## 2022-03-13 NOTE — Assessment & Plan Note (Signed)
-   Atorvastatin 80 mg nightly resumed ?

## 2022-03-13 NOTE — ED Notes (Signed)
Pt changed into hospital gown and provided with warm blankets, remote and call bell.  ?

## 2022-03-13 NOTE — Assessment & Plan Note (Signed)
-   Recommend outpatient evaluation for safe weight loss including healthy diet and exercise ?

## 2022-03-13 NOTE — ED Triage Notes (Addendum)
First Nurse Note: Pt reports weakness and he is having Palpitations.  NAD.  ?

## 2022-03-13 NOTE — Assessment & Plan Note (Signed)
-   Continue heparin gtt ?- Per CT imaging, no acure cor pul ?

## 2022-03-13 NOTE — H&P (Addendum)
?History and Physical  ? ?Robert Mccann ZS:[id number] DOB: 1996/04/08 DOA: 03/13/2022 ? ?PCP: Ellene Route  ?Outpatient Specialists: [facility] ?Patient coming from: home ? ?I have personally briefly reviewed patient's old medical records in [place]. ? ?Chief Concern: Weakness and palpitations ? ?HPI: Robert Mccann is a 26 year old male with history of morbid obesity, hypertension, non-insulin-dependent diabetes mellitus, nonischemic cardiomyopathy, history of pulmonary embolism status post thrombectomy in June 28, 2021, who presents emergency department for chief concerns of weakness, nausea, vomiting, shortness of breath. ? ?Initial vitals in the emergency department showed temperature of 98, respiration rate of 20, heart rate of 113, blood pressure 127/95, SPO2 of 98% on room air. ? ?Serum sodium was 130, potassium 3.8, chloride 96, bicarb 27, BUN of 15, serum creatinine of 1.21, nonfasting blood glucose 282, WBC 7.4, hemoglobin 14.6, platelets of 356, GFR greater than 60. ? ?INR is 1.5, PT of 15.7. ? ?BNP was elevated at 1083.1.  Troponin is 22. ? ?EDP CT a of the chest for pulmonary embolism, showing pulmonary embolus of the segmental left upper lobe pulmonary artery, new when compared to prior exam.  Additional linear filling defects are seen in bilateral subsegmental at areas of previous pulmonary embolism, likely chronic.  New rounded filling defect arising from the wall of the right atrium, concerning for right atrial thrombus.  Recommend echocardiogram.  Groundglass opacity of the left upper lobe, likely infectious or inflammatory.  Cardiomegaly. ? ?Per document note, patient has not been taking his anticoagulation for about 2 weeks due to running out of medications. ? ?ED treatment: Heparin bolus and GGT. ? ?At bedside, he is able to tell me his name, age, current location, and current calendar year.  ? ?He reports that he has been out of his blood thinners for about 2  weeks.  He states he is called pharmacy and he discussed with his cardiologist who has been using medication however pharmacy is pending insurance approval. ? ?He states that for the last month he has had increased nausea and vomiting and poor p.o. intake because he has not been able to keep anything down.  He states his vomitus is clear to orange and sometimes green in color.  He states that his last p.o. intake was water in the emergency department.  Prior to this, he ate 2 days ago in with his Ensure. ? ?At bedside he endorses some shortness of breath with exertion.  He denies chest pain, abdominal pain, dysuria, hematuria, diarrhea, swelling in his lower extremities.  He denies cough.  He does endorse sick contacts, his girlfriend was sick a week ago. ? ?He states that his weight has been relatively stable however since the diagnosis of PE he has lost a significant amount of weight.  He was not able to quantify to me the specific number of pounds. ? ?Social history: He denies history of tobacco, etoh, current recreational drug use. He reports he formerly used THC, and has not smoked in a few months. 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 ?Vaccination history: He is vaccinated for covid and influenza. ? ?ROS: ?Constitutional: no weight change, no fever ?ENT/Mouth: no sore throat, no rhinorrhea ?Eyes: no eye pain, no vision changes ?Cardiovascular: no chest pain, no dyspnea,  no edema, no palpitations ?Respiratory: no cough, no sputum, no wheezing ?Gastrointestinal: no nausea, no vomiting, no diarrhea, no constipation ?Genitourinary: no urinary incontinence, no dysuria, no hematuria ?Musculoskeletal: no arthralgias, no myalgias ?Skin: no skin lesions, no pruritus, ?Neuro: +  weakness, no loss of consciousness, no syncope ?Psych: no anxiety, no depression, + decrease appetite ?Heme/Lymph: no bruising, no bleeding ? ?ED Course: Discussed with emergency medicine provider, patient requiring hospitalization  for chief concerns of new pulmonary embolism. ? ?Assessment/Plan ? ?Principal Problem: ?  NSTEMI (non-ST elevated myocardial infarction) ([place]) ?Active Problems: ?  DM II (diabetes mellitus, type II), controlled ([place]) ?  HTN (hypertension) ?  HFrEF (heart failure with reduced ejection fraction) ([place]) ?  PE (pulmonary thromboembolism) ([place]) ?  Obesity, Class III, BMI 40-49.9 (morbid obesity) ([place]) ?  Obesity ?  Diabetes mellitus type 2, noninsulin dependent ([place]) ?  Hyperlipidemia ?  Acute pulmonary embolism ([place]) ?  ?Assessment and Plan: ? ?# NSTEMI-presumed secondary to new acute pulmonary embolus of the segmental left upper lobe pulmonary artery and concerning for new right atrial thrombus in setting of patient missing 2 weeks of anticoagulation ?- Vascular, Dr. 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 ?# Non-insulin-dependent diabetes mellitus-insulin SSI with agents coverage ordered ? ?# Hyperlipidemia-atorvastatin 80 mg nightly resume ? ?# Heart failure with reduced ejection fraction-resumed carvedilol 6.25 mg p.o. twice daily ? ?# Hypertension-controlled, resumed carvedilol 6.25 mg p.o. twice daily ? ?# Morbid obesity-recommend safe outpatient partnership with PCP and weight loss clinic for safe weight loss plan including healthy diet and exercise ? ?# Progressively worsening nausea and vomiting-query gastroparesis ?- Patient will need a barium swallow/swallow study for evaluation of gastroparesis ?- However in the setting of right atrial thrombus and possible myxoma, with acute PE and NSTEMI, I have deferred ordering a swallow barium study at this time ? ?Chart reviewed.  ? ?DVT prophylaxis: Heparin  GTT ?Code Status: Full code ?Diet: Heart healthy/carb modified now, n.p.o. after midnight ?Family Communication: girlfriend at bedside, with patient's permission ?Disposition Plan: pending clinical course ?Consults called: Vascular, cardiology ?Admission status: Progressive cardiac, observation ? ?Past Medical History:  ?Diagnosis Date  ? 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 a. 05/2021 noted during periods of sleep @ time of hospitalization for PE.  ? Cardiomyopathy ([place])   ? a. 05/2021 Echo: EF <20%, glob HK. [place]:C ratio of 2.1:1 suggesting non-compaction. Gr2 DD. Sev reduced RV fxn. Nl PASP. Mildly dil RA. Mild MR; b. 10/2021 Echo: EF <20%, GrII DD; c. 10/2021 TEE: EF 20-25%, glob HK, sev red RV fxn.  ? Diabetes mellitus without complication ([place])   ? HFrEF (heart failure with reduced ejection fraction) ([place])   ? a. 05/2021 Echo: EF <20%. ? non-compaction.  ? Hypertension   ? Pulmonary embolism ([place])   ? a. 05/2021 CTA Chest: PE extending from lobar arteris of RUL and RLL w/ concern for pulml infarct-->s/p thrombolysis and thrombectomy of RUL/RML/RLL.  ? Stroke [facility])   ? a. 10/2021 CT Head: Bilat cerebral infarcts w/ mod-sided acute to subacute R temporoparietal infarct; b. 10/2021 MRI: R post PCA territory infarcts w/o hemorrhage; c. 10/2021 TEE: Neg bubble study. No LA/LAA thrombus. EF 20-25%, glob HK.  ? ?Past Surgical History:  ?Procedure Laterality Date  ? HAND SURGERY Left age 67  ? HAND SURGERY    ? PULMONARY THROMBECTOMY N/A 06/28/2021  ? Procedure: PULMONARY THROMBECTOMY;  Surgeon: Algernon Huxley, MD;  Location: [facility];  Service: Cardiovascular;  Laterality: N/A;  ? TEE WITHOUT CARDIOVERSION N/A 11/08/2021  ? Procedure: TRANSESOPHAGEAL ECHOCARDIOGRAM (TEE);  Surgeon: Kate Sable, MD;  Location: [facility] ORS;  Service: Cardiovascular;  Laterality: N/A;  ? ?Social History:  reports that he has never smoked. He has never used smokeless tobacco. He reports that he does not currently use alcohol. He reports that he  does not currently use drugs after having used the following drugs: Marijuana. ? ?No Known Allergies ?Family History  ?Problem Relation Age of Onset  ? Heart failure Mother   ? Cerebrovascular Accident Mother   ?

## 2022-03-13 NOTE — ED Notes (Signed)
Ultrasound at bedside

## 2022-03-13 NOTE — Hospital Course (Signed)
[name] is a 26 year old male with history of morbid obesity, hypertension, non-insulin-dependent diabetes mellitus, nonischemic cardiomyopathy, history of pulmonary embolism status post thrombectomy in June 28, 2021, who presents emergency department for chief concerns of weakness, nausea, vomiting, shortness of breath. ? ?Initial vitals in the emergency department showed temperature of 98, respiration rate of 20, heart rate of 113, blood pressure 127/95, SPO2 of 98% on room air. ? ?Serum sodium was 130, potassium 3.8, chloride 96, bicarb 27, BUN of 15, serum creatinine of 1.21, nonfasting blood glucose 282, WBC 7.4, hemoglobin 14.6, platelets of 356, GFR greater than 60. ? ?INR is 1.5, PT of 15.7. ? ?BNP was elevated at 1083.1.  Troponin is 22. ? ?EDP CT a of the chest for pulmonary embolism, showing pulmonary embolus of the segmental left upper lobe pulmonary artery, new when compared to prior exam.  Additional linear filling defects are seen in bilateral subsegmental at areas of previous pulmonary embolism, likely chronic.  New rounded filling defect arising from the wall of the right atrium, concerning for right atrial thrombus.  Recommend echocardiogram.  Groundglass opacity of the left upper lobe, likely infectious or inflammatory.  Cardiomegaly. ? ?Per document note, patient has not been taking his anticoagulation for about 2 weeks due to running out of medications. ? ?ED treatment: Heparin bolus and GGT. ?

## 2022-03-13 NOTE — Assessment & Plan Note (Signed)
-   Insulin SSI with agents coverage ordered ?- Patient takes [place] daily, metformin 500 mg p.o. twice daily ?

## 2022-03-13 NOTE — ED Provider Notes (Signed)
I have personally seen and evaluated the patient.  Patient is lying in bed, no distress.  Currently satting 100%  Denies any significant chest pain but states he has been experiencing some intermittent chest discomfort or tightness over the past few weeks at times.  I spoke with physician assistant Mardee Postin and helped with care decisions including starting the patient on IV heparin for his pulmonary emboli as well as likely right atrial thrombus.  Patient will be admitted to the hospitalist service for further work-up and treatment and continued IV heparin. ? ?CRITICAL CARE ?Performed by: Harvest Dark ? ? ?Total critical care time: 30 minutes ? ?Critical care time was exclusive of separately billable procedures and treating other patients. ? ?Critical care was necessary to treat or prevent imminent or life-threatening deterioration. ? ?Critical care was time spent personally by me on the following activities: development of treatment plan with patient and/or surrogate as well as nursing, discussions with consultants, evaluation of patient's response to treatment, examination of patient, obtaining history from patient or surrogate, ordering and performing treatments and interventions, ordering and review of laboratory studies, ordering and review of radiographic studies, pulse oximetry and re-evaluation of patient's condition. ? ?  ?Harvest Dark, MD ?03/13/22 2348 ? ?

## 2022-03-13 NOTE — ED Notes (Signed)
Fsbs 157 ?

## 2022-03-13 NOTE — Assessment & Plan Note (Addendum)
-   Continue heparin GTT ?- Complete echo ordered ?- CTA of the chest did not read right heart strain ?- Appreciate EDP calling vascular for further assistance and possible thrombectomy ?- N.p.o. at this time ?- Admit to progressive cardiac, observation, telemetry ?

## 2022-03-13 NOTE — Consult Note (Addendum)
ANTICOAGULATION CONSULT NOTE - Initial Consult ? ?Pharmacy Consult for heparin ?Indication: pulmonary embolus ? ?No Known Allergies ? ?Patient Measurements: ?Height: 6' (182.9 cm) ?Weight: (!) 147.4 kg (325 lb) ?IBW/kg (Calculated) : 77.6 ?Heparin Dosing Weight: 112.1kg ? ?Vital Signs: ?Temp: 98 ?F (36.7 ?C) (03/14 1525) ?Temp Source: Oral (03/14 1525) ?BP: 127/95 (03/14 1525) ?Pulse Rate: 113 (03/14 1525) ? ?Labs: ?Recent Labs  ?  03/13/22 ?1530  ?HGB 14.6  ?HCT 44.7  ?PLT 356  ?LABPROT 15.7*  ?INR 1.3*  ?CREATININE 1.21  ?TROPONINIHS 22*  ? ? ?Estimated Creatinine Clearance: 136.8 mL/min (by C-G formula based on SCr of 1.21 mg/dL). ? ? ?Medical History: ?Past Medical History:  ?Diagnosis Date  ? 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 a. 05/2021 noted during periods of sleep @ time of hospitalization for PE.  ? Cardiomyopathy ([place])   ? a. 05/2021 Echo: EF <20%, glob HK. [place]:C ratio of 2.1:1 suggesting non-compaction. Gr2 DD. Sev reduced RV fxn. Nl PASP. Mildly dil RA. Mild MR; b. 10/2021 Echo: EF <20%, GrII DD; c. 10/2021 TEE: EF 20-25%, glob HK, sev red RV fxn.  ? Diabetes mellitus without complication ([place])   ? HFrEF (heart failure with reduced ejection fraction) ([place])   ? a. 05/2021 Echo: EF <20%. ? non-compaction.  ? Hypertension   ? Pulmonary embolism ([place])   ? a. 05/2021 CTA Chest: PE extending from lobar arteris of RUL and RLL w/ concern for pulml infarct-->s/p thrombolysis and thrombectomy of RUL/RML/RLL.  ? Stroke [facility])   ? a. 10/2021 CT Head: Bilat cerebral infarcts w/ mod-sided acute to subacute R temporoparietal infarct; b. 10/2021 MRI: R post PCA territory infarcts w/o hemorrhage; c. 10/2021 TEE: Neg bubble study. No LA/LAA thrombus. EF 20-25%, glob HK.  ? ? ?Medications:  ?PTA: Apixaban 5mg  BID ?Last dose of Eliquis was 2 days ago ?Inpatient: Heparin drip (3/14 >>>) ?Allergies: NKDA ? ?Assessment: ?26 y.o. male, history of type 2 diabetes, hypertension, HFrEF, PE, obesity, CVA, presents to the emergency department for  evaluation of emesis and palpitations x1 month. Found to have new pulmonary embolism in the segmental left upper lobe pulmonary artery, as well as a right atrial thrombus. Pharmacy consulted for management of heparin drip in the setting of acute pulmonary embolism. ? ?Date Time aPTT/HL Rate/Comment ?     ? ?Baseline Labs: ?aPTT - pending ?Anti-Xa level - pending ?INR - 1.3 ?Hgb - 14.6 ?Plts - 356 ? ?Goal of Therapy:  ?Heparin level 0.3-0.7 units/ml ?aPTT 66-102 seconds ?Monitor platelets by anticoagulation protocol: Yes ? ?Plan:  ?Hold Eliquis inpatient ?Patient was previously therapeutic at 2100 units/hr at prior admission (within 5% of dosing weight) and was given heparin IV 7000 unit bolus. ?Give heparin IV 7000 units bolus x1; then start heparin IV infusion at 2100 units/hr ?Check aPTT/Anti-Xa level in 6 hours and daily once consecutively therapeutic.  ?Titrate by aPTT's until lab correlation is noted, then titrate by anti-xa alone. ?Continue to monitor H&H and platelets daily while on heparin gtt. ? ?Darrick Penna, PharmD, MS PGPM ?Clinical Pharmacist ?03/13/2022 ?6:11 PM ? ? ? ?

## 2022-03-14 ENCOUNTER — Observation Stay (HOSPITAL_COMMUNITY)
Admit: 2022-03-14 | Discharge: 2022-03-14 | Disposition: A | Discharge status: Home / Self Care | Payer: 59 | Attending: Internal Medicine | Admitting: Internal Medicine

## 2022-03-14 DIAGNOSIS — I5020 Unspecified systolic (congestive) heart failure: Secondary | ICD-10-CM | POA: Diagnosis not present

## 2022-03-14 DIAGNOSIS — E119 Type 2 diabetes mellitus without complications: Secondary | ICD-10-CM | POA: Diagnosis present

## 2022-03-14 DIAGNOSIS — R531 Weakness: Secondary | ICD-10-CM | POA: Diagnosis present

## 2022-03-14 DIAGNOSIS — Z7901 Long term (current) use of anticoagulants: Secondary | ICD-10-CM | POA: Diagnosis not present

## 2022-03-14 DIAGNOSIS — I441 Atrioventricular block, second degree: Secondary | ICD-10-CM | POA: Diagnosis present

## 2022-03-14 DIAGNOSIS — N179 Acute kidney failure, unspecified: Secondary | ICD-10-CM | POA: Diagnosis present

## 2022-03-14 DIAGNOSIS — Z8673 Personal history of transient ischemic attack (TIA), and cerebral infarction without residual deficits: Secondary | ICD-10-CM | POA: Diagnosis not present

## 2022-03-14 DIAGNOSIS — I513 Intracardiac thrombosis, not elsewhere classified: Secondary | ICD-10-CM | POA: Insufficient documentation

## 2022-03-14 DIAGNOSIS — T45516A Underdosing of anticoagulants, initial encounter: Secondary | ICD-10-CM | POA: Diagnosis present

## 2022-03-14 DIAGNOSIS — Z8249 Family history of ischemic heart disease and other diseases of the circulatory system: Secondary | ICD-10-CM | POA: Diagnosis not present

## 2022-03-14 DIAGNOSIS — I110 Hypertensive heart disease with heart failure: Secondary | ICD-10-CM | POA: Diagnosis present

## 2022-03-14 DIAGNOSIS — I2699 Other pulmonary embolism without acute cor pulmonale: Secondary | ICD-10-CM

## 2022-03-14 DIAGNOSIS — Z7984 Long term (current) use of oral hypoglycemic drugs: Secondary | ICD-10-CM | POA: Diagnosis not present

## 2022-03-14 DIAGNOSIS — I214 Non-ST elevation (NSTEMI) myocardial infarction: Secondary | ICD-10-CM | POA: Diagnosis not present

## 2022-03-14 DIAGNOSIS — I2782 Chronic pulmonary embolism: Secondary | ICD-10-CM | POA: Diagnosis present

## 2022-03-14 DIAGNOSIS — I5082 Biventricular heart failure: Secondary | ICD-10-CM | POA: Diagnosis not present

## 2022-03-14 DIAGNOSIS — I2609 Other pulmonary embolism with acute cor pulmonale: Secondary | ICD-10-CM | POA: Diagnosis not present

## 2022-03-14 DIAGNOSIS — E1169 Type 2 diabetes mellitus with other specified complication: Secondary | ICD-10-CM | POA: Diagnosis not present

## 2022-03-14 DIAGNOSIS — I5023 Acute on chronic systolic (congestive) heart failure: Secondary | ICD-10-CM | POA: Diagnosis present

## 2022-03-14 DIAGNOSIS — E785 Hyperlipidemia, unspecified: Secondary | ICD-10-CM | POA: Diagnosis present

## 2022-03-14 DIAGNOSIS — K7200 Acute and subacute hepatic failure without coma: Secondary | ICD-10-CM | POA: Diagnosis not present

## 2022-03-14 DIAGNOSIS — Z91120 Patient's intentional underdosing of medication regimen due to financial hardship: Secondary | ICD-10-CM | POA: Diagnosis not present

## 2022-03-14 DIAGNOSIS — R7989 Other specified abnormal findings of blood chemistry: Secondary | ICD-10-CM | POA: Diagnosis present

## 2022-03-14 DIAGNOSIS — I10 Essential (primary) hypertension: Secondary | ICD-10-CM | POA: Diagnosis not present

## 2022-03-14 DIAGNOSIS — Z823 Family history of stroke: Secondary | ICD-10-CM | POA: Diagnosis not present

## 2022-03-14 DIAGNOSIS — E611 Iron deficiency: Secondary | ICD-10-CM | POA: Diagnosis not present

## 2022-03-14 DIAGNOSIS — Z6841 Body Mass Index (BMI) 40.0 and over, adult: Secondary | ICD-10-CM | POA: Diagnosis not present

## 2022-03-14 DIAGNOSIS — Z91199 Patient's noncompliance with other medical treatment and regimen due to unspecified reason: Secondary | ICD-10-CM | POA: Diagnosis not present

## 2022-03-14 DIAGNOSIS — I420 Dilated cardiomyopathy: Secondary | ICD-10-CM | POA: Diagnosis present

## 2022-03-14 DIAGNOSIS — Z833 Family history of diabetes mellitus: Secondary | ICD-10-CM | POA: Diagnosis not present

## 2022-03-14 DIAGNOSIS — Z20822 Contact with and (suspected) exposure to covid-19: Secondary | ICD-10-CM | POA: Diagnosis present

## 2022-03-14 LAB — CBC
HCT: 45.5 % (ref 39.0–52.0)
Hemoglobin: 14.8 g/dL (ref 13.0–17.0)
MCH: 28.7 pg (ref 26.0–34.0)
MCHC: 32.5 g/dL (ref 30.0–36.0)
MCV: 88.3 fL (ref 80.0–100.0)
Platelets: 383 10*3/uL (ref 150–400)
RBC: 5.15 MIL/uL (ref 4.22–5.81)
RDW: 12.4 % (ref 11.5–15.5)
WBC: 8.1 10*3/uL (ref 4.0–10.5)
nRBC: 0 % (ref 0.0–0.2)

## 2022-03-14 LAB — BASIC METABOLIC PANEL
Anion gap: 7 (ref 5–15)
BUN: 13 mg/dL (ref 6–20)
CO2: 29 mmol/L (ref 22–32)
Calcium: 8.5 mg/dL — ABNORMAL LOW (ref 8.9–10.3)
Chloride: 94 mmol/L — ABNORMAL LOW (ref 98–111)
Creatinine, Ser: 1.05 mg/dL (ref 0.61–1.24)
GFR, Estimated: >60 mL/min (ref >60)
Glucose, Bld: 187 mg/dL — ABNORMAL HIGH (ref 70–99)
Potassium: 3.5 mmol/L (ref 3.5–5.1)
Sodium: 130 mmol/L — ABNORMAL LOW (ref 135–145)

## 2022-03-14 LAB — CBG MONITORING, ED
Glucose-Capillary: 190 mg/dL — ABNORMAL HIGH (ref 70–99)
Glucose-Capillary: 211 mg/dL — ABNORMAL HIGH (ref 70–99)

## 2022-03-14 LAB — HEPARIN LEVEL (UNFRACTIONATED)
Heparin Unfractionated: 0.67 IU/mL (ref 0.30–0.70)
Heparin Unfractionated: 0.54 IU/mL (ref 0.30–0.70)
Heparin Unfractionated: 0.48 IU/mL (ref 0.30–0.70)

## 2022-03-14 LAB — ECHOCARDIOGRAM COMPLETE
AR max vel: 1.27 cm2
AV Area VTI: 1.13 cm2
AV Area mean vel: 1.25 cm2
AV Mean grad: 3 mmHg
AV Peak grad: 5.6 mmHg
Ao pk vel: 1.18 m/s
Area-P 1/2: 3.24 cm2
Height: 72 in
MV VTI: 1.66 cm2
S' Lateral: 5.74 cm
Weight: 5200 oz

## 2022-03-14 LAB — APTT
aPTT: 127 seconds — ABNORMAL HIGH (ref 24–36)
aPTT: 99 seconds — ABNORMAL HIGH (ref 24–36)

## 2022-03-14 LAB — GLUCOSE, CAPILLARY
Glucose-Capillary: 245 mg/dL — ABNORMAL HIGH (ref 70–99)
Glucose-Capillary: 193 mg/dL — ABNORMAL HIGH (ref 70–99)

## 2022-03-14 LAB — RESP PANEL BY RT-PCR (FLU A&B, COVID) ARPGX2
Influenza A by PCR: NEGATIVE
Influenza B by PCR: NEGATIVE
SARS Coronavirus 2 by RT PCR: NEGATIVE

## 2022-03-14 MED ORDER — FUROSEMIDE 10 MG/ML IJ SOLN
40.0000 mg | Freq: Every day | INTRAMUSCULAR | Status: DC
  Administered 2022-03-14 – 2022-03-16 (×3): 40 mg via INTRAVENOUS
  Filled 2022-03-14 (×3): qty 4

## 2022-03-14 NOTE — ED Notes (Signed)
This RN to bedside for pt. Having episode of post-tussive emesis. See MAR. Pt. Denies any pain or further need. NAD. ?

## 2022-03-14 NOTE — ED Notes (Signed)
Patient AOX4. Resp even, unlabored on RA. Denies palpitations. Assisted up to recliner per request. NSR on monitor at 98. Denies pain or needs at this time. Heparin gtt infusing at 2100 units/hr. No distress noted.  ?

## 2022-03-14 NOTE — Consult Note (Signed)
ANTICOAGULATION CONSULT NOTE  ? ?Pharmacy Consult for heparin ?Indication: pulmonary embolus ? ?No Known Allergies ? ?Patient Measurements: ?Height: 6' (182.9 cm) ?Weight: (!) 148.9 kg (328 lb 3.2 oz) ?IBW/kg (Calculated) : 77.6 ?Heparin Dosing Weight: 112.1kg ? ?Vital Signs: ?Temp: 97.9 ?F (36.6 ?C) (03/15 1639) ?Temp Source: Oral (03/15 1325) ?BP: 118/84 (03/15 1639) ?Pulse Rate: 104 (03/15 1639) ? ?Labs: ?Recent Labs  ?  03/13/22 ?1527 03/13/22 ?1530 03/13/22 ?1828 03/14/22 ?0110 03/14/22 ?0503 03/14/22 ?0800 03/14/22 ?1649  ?HGB  --  14.6  --   --  14.8  --   --   ?HCT  --  44.7  --   --  45.5  --   --   ?PLT  --  356  --   --  383  --   --   ?APTT 31  --   --  127*  --  99*  --   ?LABPROT  --  15.7*  --   --   --   --   --   ?INR  --  1.3*  --   --   --   --   --   ?HEPARINUNFRC  --   --  <0.10* 0.67  --   --  0.54  ?CREATININE  --  1.21  --   --  1.05  --   --   ?TROPONINIHS  --  22* 24*  --   --   --   --   ? ? ? ?Estimated Creatinine Clearance: 158.6 mL/min (by C-G formula based on SCr of 1.05 mg/dL). ? ? ?Medical History: ?Past Medical History:  ?Diagnosis Date  ? 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 a. 05/2021 noted during periods of sleep @ time of hospitalization for PE.  ? Cardiomyopathy ([place])   ? a. 05/2021 Echo: EF <20%, glob HK. [place]:C ratio of 2.1:1 suggesting non-compaction. Gr2 DD. Sev reduced RV fxn. Nl PASP. Mildly dil RA. Mild MR; b. 10/2021 Echo: EF <20%, GrII DD; c. 10/2021 TEE: EF 20-25%, glob HK, sev red RV fxn.  ? Diabetes mellitus without complication ([place])   ? HFrEF (heart failure with reduced ejection fraction) ([place])   ? a. 05/2021 Echo: EF <20%. ? non-compaction.  ? Hypertension   ? Pulmonary embolism ([place])   ? a. 05/2021 CTA Chest: PE extending from lobar arteris of RUL and RLL w/ concern for pulml infarct-->s/p thrombolysis and thrombectomy of RUL/RML/RLL.  ? Stroke [facility])   ? a. 10/2021 CT Head: Bilat cerebral infarcts w/ mod-sided acute to subacute R temporoparietal infarct; b. 10/2021 MRI: R post PCA  territory infarcts w/o hemorrhage; c. 10/2021 TEE: Neg bubble study. No LA/LAA thrombus. EF 20-25%, glob HK.  ? ? ?Medications:  ?PTA: Apixaban 5mg  BID ?Last dose of Eliquis was 2 days ago ?Inpatient: Heparin drip (3/14 >>>) ?Allergies: NKDA ? ?Assessment: ?26 y.o. male, history of type 2 diabetes, hypertension, HFrEF, PE, obesity, CVA, presents to the emergency department for evaluation of emesis and palpitations x1 month. Found to have new pulmonary embolism in the segmental left upper lobe pulmonary artery, as well as a right atrial thrombus. Pharmacy consulted for management of heparin drip in the setting of acute pulmonary embolism. ? ?Date Time aPTT/HL Rate/Comment ?3/15 0110 0.67/127 SUPRAtherapeutic/ 2100>1900 ?3/15 0800 ----/99(aPTT) Therapeutic / 1900 units/hr ?3/15 1649 0.54 (HL) Therapeutic  / 1900 units/hr ? ? ?Baseline Labs: ?aPTT - 31 ?Anti-Xa level - <0.1 ?INR - 1.3 ?Hgb - 14.6 ?Plts - 356 ? ?Goal  of Therapy:  ?Heparin level 0.3-0.7 units/ml ?aPTT 66-102 seconds ?Monitor platelets by anticoagulation protocol: Yes ? ?Plan: HL therapeutic ?Continue heparin infusion at 1900 units/hr ?Given that baseline HL was <0.1, we will use HL to guide dosing ?6 hour heparin level to confirm ?Daily CBC ? ? ?Wynelle Cleveland, PharmD ?Pharmacy Resident  ?03/14/2022 ?5:27 PM ? ? ?

## 2022-03-14 NOTE — Consult Note (Signed)
ANTICOAGULATION CONSULT NOTE - Initial Consult ? ?Pharmacy Consult for heparin ?Indication: pulmonary embolus ? ?No Known Allergies ? ?Patient Measurements: ?Height: 6' (182.9 cm) ?Weight: (!) 147.4 kg (325 lb) ?IBW/kg (Calculated) : 77.6 ?Heparin Dosing Weight: 112.1kg ? ?Vital Signs: ?Temp: 98 ?F (36.7 ?C) (03/14 1525) ?Temp Source: Oral (03/14 1525) ?BP: 125/90 (03/15 0200) ?Pulse Rate: 97 (03/15 0200) ? ?Labs: ?Recent Labs  ?  03/13/22 ?1527 03/13/22 ?1530 03/13/22 ?1828 03/14/22 ?0110  ?HGB  --  14.6  --   --   ?HCT  --  44.7  --   --   ?PLT  --  356  --   --   ?APTT 31  --   --  127*  ?LABPROT  --  15.7*  --   --   ?INR  --  1.3*  --   --   ?HEPARINUNFRC  --   --  <0.10* 0.67  ?CREATININE  --  1.21  --   --   ?TROPONINIHS  --  22* 24*  --   ? ? ? ?Estimated Creatinine Clearance: 136.8 mL/min (by C-G formula based on SCr of 1.21 mg/dL). ? ? ?Medical History: ?Past Medical History:  ?Diagnosis Date  ? 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 a. 05/2021 noted during periods of sleep @ time of hospitalization for PE.  ? Cardiomyopathy ([facility])   ? a. 05/2021 Echo: EF <20%, glob HK. [name] ratio of 2.1:1 suggesting non-compaction. Gr2 DD. Sev reduced RV fxn. Nl PASP. Mildly dil RA. Mild MR; b. 10/2021 Echo: EF <20%, GrII DD; c. 10/2021 TEE: EF 20-25%, glob HK, sev red RV fxn.  ? Diabetes mellitus without complication ([facility])   ? HFrEF (heart failure with reduced ejection fraction) ([facility])   ? a. 05/2021 Echo: EF <20%. ? non-compaction.  ? Hypertension   ? Pulmonary embolism ([facility])   ? a. 05/2021 CTA Chest: PE extending from lobar arteris of RUL and RLL w/ concern for pulml infarct-->s/p thrombolysis and thrombectomy of RUL/RML/RLL.  ? Stroke [facility])   ? a. 10/2021 CT Head: Bilat cerebral infarcts w/ mod-sided acute to subacute R temporoparietal infarct; b. 10/2021 MRI: R post PCA territory infarcts w/o hemorrhage; c. 10/2021 TEE: Neg bubble study. No LA/LAA thrombus. EF 20-25%, glob HK.  ? ? ?Medications:  ?PTA: Apixaban 5mg  BID ?Last dose of  Eliquis was 2 days ago ?Inpatient: Heparin drip (3/14 >>>) ?Allergies: NKDA ? ?Assessment: ?26 y.o. male, history of type 2 diabetes, hypertension, HFrEF, PE, obesity, CVA, presents to the emergency department for evaluation of emesis and palpitations x1 month. Found to have new pulmonary embolism in the segmental left upper lobe pulmonary artery, as well as a right atrial thrombus. Pharmacy consulted for management of heparin drip in the setting of acute pulmonary embolism. ? ?Date Time aPTT/HL Rate/Comment ?     ? ?Baseline Labs: ?aPTT - pending ?Anti-Xa level - pending ?INR - 1.3 ?Hgb - 14.6 ?Plts - 356 ? ?Goal of Therapy:  ?Heparin level 0.3-0.7 units/ml ?aPTT 66-102 seconds ?Monitor platelets by anticoagulation protocol: Yes ? ?Plan:  ?3/15 @ 0110 :  HL = 0.67,  aPTT = 127 ?aPTT elevated but HL is therapeutic, will continue to use aPTT to guide dosing. ?Will decrease heparin drip to 1900 units/hr.  ?Will recheck aPTT 6 hrs after rate change on 3/15 @ 0800. ?Will recheck HL on 3/16 with AM labs. ? ?Demarie Uhlig D ?Clinical Pharmacist ?03/14/2022 ?2:07 AM ? ? ? ?

## 2022-03-14 NOTE — Progress Notes (Signed)
Admission profile updated. ?

## 2022-03-14 NOTE — Consult Note (Signed)
ANTICOAGULATION CONSULT NOTE - Initial Consult ? ?Pharmacy Consult for heparin ?Indication: pulmonary embolus ? ?No Known Allergies ? ?Patient Measurements: ?Height: 6' (182.9 cm) ?Weight: (!) 147.4 kg (325 lb) ?IBW/kg (Calculated) : 77.6 ?Heparin Dosing Weight: 112.1kg ? ?Vital Signs: ?BP: 136/95 (03/15 0900) ?Pulse Rate: 100 (03/15 1000) ? ?Labs: ?Recent Labs  ?  03/13/22 ?1527 03/13/22 ?1530 03/13/22 ?1828 03/14/22 ?0110 03/14/22 ?0503 03/14/22 ?0800  ?HGB  --  14.6  --   --  14.8  --   ?HCT  --  44.7  --   --  45.5  --   ?PLT  --  356  --   --  383  --   ?APTT 31  --   --  127*  --  99*  ?LABPROT  --  15.7*  --   --   --   --   ?INR  --  1.3*  --   --   --   --   ?HEPARINUNFRC  --   --  <0.10* 0.67  --   --   ?CREATININE  --  1.21  --   --  1.05  --   ?TROPONINIHS  --  22* 24*  --   --   --   ? ? ?Estimated Creatinine Clearance: 157.7 mL/min (by C-G formula based on SCr of 1.05 mg/dL). ? ? ?Medical History: ?Past Medical History:  ?Diagnosis Date  ? 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 a. 05/2021 noted during periods of sleep @ time of hospitalization for PE.  ? Cardiomyopathy ([facility])   ? a. 05/2021 Echo: EF <20%, glob HK. [place]:C ratio of 2.1:1 suggesting non-compaction. Gr2 DD. Sev reduced RV fxn. Nl PASP. Mildly dil RA. Mild MR; b. 10/2021 Echo: EF <20%, GrII DD; c. 10/2021 TEE: EF 20-25%, glob HK, sev red RV fxn.  ? Diabetes mellitus without complication ([facility])   ? HFrEF (heart failure with reduced ejection fraction) ([facility])   ? a. 05/2021 Echo: EF <20%. ? non-compaction.  ? Hypertension   ? Pulmonary embolism ([facility])   ? a. 05/2021 CTA Chest: PE extending from lobar arteris of RUL and RLL w/ concern for pulml infarct-->s/p thrombolysis and thrombectomy of RUL/RML/RLL.  ? Stroke [facility])   ? a. 10/2021 CT Head: Bilat cerebral infarcts w/ mod-sided acute to subacute R temporoparietal infarct; b. 10/2021 MRI: R post PCA territory infarcts w/o hemorrhage; c. 10/2021 TEE: Neg bubble study. No LA/LAA thrombus. EF 20-25%, glob HK.   ? ? ?Medications:  ?PTA: Apixaban 5mg  BID ?Last dose of Eliquis was 2 days ago ?Inpatient: Heparin drip (3/14 >>>) ?Allergies: NKDA ? ?Assessment: ?26 y.o. male, history of type 2 diabetes, hypertension, HFrEF, PE, obesity, CVA, presents to the emergency department for evaluation of emesis and palpitations x1 month. Found to have new pulmonary embolism in the segmental left upper lobe pulmonary artery, as well as a right atrial thrombus. Pharmacy consulted for management of heparin drip in the setting of acute pulmonary embolism. ? ?Date Time aPTT/HL Rate/Comment ?3/15 0110 0.67/127 SUPRAtherapeutic/ 2100>1900 ?3/15 0800 ----/99  Therapeutic x 1  ? ? ?Baseline Labs: ?aPTT - 31 ?Anti-Xa level - <0.1 ?INR - 1.3 ?Hgb - 14.6 ?Plts - 356 ? ?Goal of Therapy:  ?Heparin level 0.3-0.7 units/ml ?aPTT 66-102 seconds ?Monitor platelets by anticoagulation protocol: Yes ? ?Plan:  ?aPTT therapeutic. Will continue heparin infusion at 1900 units/hr ?Given that baseline HL was <0.1, we will use HL to guide dosing ?Will check heparin level in 6 hours,  then daily once consecutively therapeutic  ?Continue to monitor H&H and platelets daily ? ?Selinda Eon, PharmD, MS PGPM ?Clinical Pharmacist ?03/14/2022 ?10:45 AM ? ? ? ? ?

## 2022-03-14 NOTE — ED Notes (Signed)
Per Dr Lucianne Muss, pt to stay NPO at this time.  ?

## 2022-03-14 NOTE — Consult Note (Signed)
?Cardiology Consultation:  ? ?Patient ID: [facility] ?MRN: [id number]; DOB: 1996-08-07 ? ?Admit date: 03/13/2022 ?Date of Consult: 03/14/2022 ? ?PCP:  [facility] ?  ?[facility] Providers ?Cardiologist:  Robert Odea, MD   { ? ?Patient Profile:  ? ?Robert [facility] is a 26 y.o. 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 Lucianne Mcc[facility]. ? ?History of Present Illness:  ? ?[facility] was admitted to [facility] in June 2022 with chest pain and dyspnea. T of the chest revealed pulmonary emboli extending from the lobar arteris of the right upper and lowe rlones throughout their segmental branches. He subsequently underwent thrombectomy and thrombolysis of the tight upper/middle/lower lobes. He was treated with heparin and subsequently Eliquis. Lower extremity US was negative for DVT though it was noted that the patient previously spent 12 hours each way to [facility] city a week prior to admission. Echo showed EF<20% with global HK, G2DD and severely reduced RV function. There was concern for LC noncompaction with recommendation for CMR as outpatient. Telemetry showed intermittent Mobitz type 2 heart block during periods of sleep with recommendation for outpatient sleep study. He was placed on heart failure GDMT with Core, Entrest, [facility]. At follow-up in July 2022 he was only taking coreg, Eliquis and low dose lasix as he could not afford [place] or Entresto. In September lasix was switched to Torsemide and he was started on spironolactone. ? ?He as admitted in early November with 3 day history of dysarthria and left hand numbness/weakness. 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 TEE during admission did not show and LA/LAA/LV thrombus and bubble study was negative. He  reported compliance with Eliquis, which was held int he setting of areas petechial hemorrhage on follow-up CT.  ? ?He was last seen 01/17/22 and switching to warfarin was discussed, patient patient wanted to continue with Eliquis. He was overall doing OK. He reported 41lbs weight loss int he last couple months. Coronary CTA was ordered to evaluate for ischemia. Plan was for cMRI, especially given weight loss. He reported compliance with meds, but needed refills.  ? ?The patient presented for emesis for the last month. Reported frequent nasuea and vomiting, no blood noted. Also reported some shortness of breath and palpitations. Said he had brief chest pains, worse when he lay down. No LLE. He reports he has not had Eliquis in 2 weeks as he needs a refill. Also, unsure if he is taking all prescribed cardiac medications.  ? ?Int he ER BP 127/95, pulse 113, 97 F, 98% O2. Labs showed potassium 3.8, bicarb 27, Scr 1.21, BUN 15, WBC 7.4, plt 356K, BNP 1,0831, albumin 2.9, AST 118, ALT 129. HS top 22>24. EKG showed ST 114bpm with nonspecific ST/T wave changes. CXR showed cardiomegaly with moderate pulmonary vascular congestion. DVT study negative. CTA chest showed PE of left upper lobe, possible right atrial thrombus. The patient was started on IV heparin and admitted.  ? ? ?Past Medical History:  ?Diagnosis Date  ? 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 a. 05/2021 noted during periods of sleep @ time of hospitalization for PE.  ? Cardiomyopathy ([facility])   ? a. 05/2021 Echo: EF <20%, glob HK. [place]:C ratio of 2.1:1 suggesting non-compaction. Gr2 DD. Sev reduced RV fxn. Nl PASP. Mildly dil RA.  Mild MR; b. 10/2021 Echo: EF <20%, GrII DD; c. 10/2021 TEE: EF 20-25%, glob HK, sev red RV fxn.  ? Diabetes mellitus without complication ([facility])   ? HFrEF (heart failure with reduced ejection fraction) ([facility])   ? a. 05/2021 Echo: EF <20%. ? non-compaction.  ? Hypertension   ? Pulmonary embolism ([facility])   ? a. 05/2021 CTA Chest: PE extending from lobar arteris of  RUL and RLL w/ concern for pulml infarct-->s/p thrombolysis and thrombectomy of RUL/RML/RLL.  ? Stroke [facility])   ? a. 10/2021 CT Head: Bilat cerebral infarcts w/ mod-sided acute to subacute R temporoparietal infarct; b. 10/2021 MRI: R post PCA territory infarcts w/o hemorrhage; c. 10/2021 TEE: Neg bubble study. No LA/LAA thrombus. EF 20-25%, glob HK.  ? ? ?Past Surgical History:  ?Procedure Laterality Date  ? HAND SURGERY Left age 18  ? HAND SURGERY    ? PULMONARY THROMBECTOMY N/A 06/28/2021  ? Procedure: PULMONARY THROMBECTOMY;  Surgeon: Robert Needy, MD;  Location: [facility];  Service: Cardiovascular;  Laterality: N/A;  ? TEE WITHOUT CARDIOVERSION N/A 11/08/2021  ? Procedure: TRANSESOPHAGEAL ECHOCARDIOGRAM (TEE);  Surgeon: Robert Odea, MD;  Location: [facility];  Service: Cardiovascular;  Laterality: N/A;  ?  ? ?Home Medications:  ?Prior to Admission medications   ?Medication Sig Start Date End Date Taking? Authorizing Provider  ?apixaban (ELIQUIS) 5 MG TABS tablet Take 5 mg by mouth 2 (two) times daily.   Yes [provider]  ?atorvastatin (LIPITOR) 80 MG tablet Take 1 tablet (80 mg total) by mouth daily. 01/17/22  Yes Robert Hines, NP  ?carvedilol (COREG) 6.25 MG tablet Take 1 tablet (6.25 mg total) by mouth 2 (two) times daily with a meal. 01/17/22  Yes Robert Hines, NP  ?FARXIGA 10 MG TABS tablet Take 1 tablet (10 mg total) by mouth daily. 01/17/22  Yes Robert Hines, NP  ?ivabradine (CORLANOR) 5 MG TABS tablet Take 15mg  (3 tablets) TWO hours prior to cardiac CT scan in addition to 100mg  of metoprolol tartrate. 01/31/22   , MD  ?metFORMIN (GLUCOPHAGE) 500 MG tablet Take 1 tablet (500 mg total) by mouth 2 (two) times daily. 06/30/21  Yes Robert Odea, MD  ?Multiple Vitamin (ONE-A-DAY MENS PO) Take 1 tablet by mouth daily.   Yes [provider]  ?sacubitril-valsartan (ENTRESTO) 24-26 MG Take 1 tablet by mouth 2 (two) times daily.  01/17/22  Yes Robert Finner, NP  ?spironolactone (ALDACTONE) 25 MG tablet Take 0.5 tablets (12.5 mg total) by mouth daily. 01/17/22  Yes Robert Hines, NP  ?Torsemide 40 MG TABS Take 40 mg by mouth 2 (two) times daily. PLEASE SCHEDULE OFFICE VISIT FOR FURTHER REFILLS. THANK YOU! 03/07/22  Yes Robert Hines, NP  ?Biotin 1000 MCG tablet Take 1 tablet (1 mg total) by mouth daily. Chew 1 gummy daily. ?Patient not taking: Reported on 03/13/2022 06/30/21   03/15/2022, MD  ?metoprolol tartrate (LOPRESSOR) 100 MG tablet Take 1 tablet (100mg ) TWO hours prior to cardiac CT scan. 01/31/22   Robert Finner, MD  ?metoprolol tartrate (LOPRESSOR) 50 MG tablet Take 1 tablet (50 mg total) by mouth once for 1 dose. Take 2 hours prior to your CT. 01/17/22 01/17/22  Robert Odea, NP  ? ? ?Inpatient Medications: ?Scheduled Meds: ? atorvastatin  80 mg Oral Daily  ? carvedilol  6.25 mg Oral BID WC  ? insulin aspart  0-20 Units Subcutaneous TID WC  ? insulin aspart  0-5 Units Subcutaneous  QHS  ? ?Continuous Infusions: ? heparin 1,900 Units/hr (03/14/22 0342)  ? ?PRN Meds: ?acetaminophen **OR** acetaminophen, ondansetron **OR** ondansetron (ZOFRAN) IV ? ?Allergies:   No Known Allergies ? ?Social History:   ?Social History  ? ?Socioeconomic History  ? Marital status: Single  ?  Spouse name: Not on file  ? Number of children: Not on file  ? Years of education: Not on file  ? Highest education level: Not on file  ?Occupational History  ? Not on file  ?Tobacco Use  ? Smoking status: Never  ? Smokeless tobacco: Never  ?Vaping Use  ? Vaping Use: Never used  ?Substance and Sexual Activity  ? Alcohol use: Not Currently  ?  Comment: special occasions  ? Drug use: Not Currently  ?  Types: Marijuana  ?  Comment: last month  ? Sexual activity: Yes  ?  Partners: Female  ?Other Topics Concern  ? Not on file  ?Social History Narrative  ? Not on file  ? ?Social Determinants of Health  ? ?Financial Resource Strain:  Not on file  ?Food Insecurity: Not on file  ?Transportation Needs: Not on file  ?Physical Activity: Not on file  ?Stress: Not on file  ?Social Connections: Not on file  ?Intimate Partner Violence: Not on f

## 2022-03-14 NOTE — Progress Notes (Signed)
*  PRELIMINARY RESULTS* ?Echocardiogram ?2D Echocardiogram has been performed. ? ?[facility] ?03/14/2022, 9:07 AM ?

## 2022-03-14 NOTE — Progress Notes (Signed)
B ?PROGRESS NOTE ? ? ? ?Robert Mccann  AOZ:[id number] DOB: 09-23-1996 DOA: 03/13/2022 ?PCP: Nira Retort  ? ?Brief Narrative:  ?This 26 years old male with PMH significant for morbid obesity, hypertension, non-insulin-dependent diabetes mellitus, nonischemic cardiomyopathy, history of pulmonary embolism status post thrombectomy in June 28, 2021 presented in the ED with chief complaints of weakness, nausea vomiting and shortness of breath.  BNP found to be 1083. Trop 22> 24.  CTA chest showed new pulmonary embolism.  Patient also found to have a rounded filling defect at the wall of the right atrium concerning for right arterial thrombus.  Groundglass opacity probably infectious or inflammatory.  Patient reported he has not been taking anticoagulation for 2 weeks as he ran out of the medications.  Patient started on heparin gtt. ? ?Patient is admitted for new PE, patient started on IV heparin.  Cardiology is consulted. ? ?Assessment & Plan: ?  ?Principal Problem: ?  NSTEMI (non-ST elevated myocardial infarction) (HCC) ?Active Problems: ?  DM II (diabetes mellitus, type II), controlled (HCC) ?  HTN (hypertension) ?  HFrEF (heart failure with reduced ejection fraction) (HCC) ?  PE (pulmonary thromboembolism) (HCC) ?  Obesity, Class III, BMI 40-49.9 (morbid obesity) (HCC) ?  Obesity ?  Diabetes mellitus type 2, noninsulin dependent (HCC) ?  Hyperlipidemia ?  Acute pulmonary embolism (HCC) ? ?Cardiomyopathy,  Nonischemic: ?Acute on chronic systolic CHF. ?Likely from noncompliance. Last LVEF less than 20%. ?Continue Coreg 6.25 mg twice daily, continue Lasix 40 mg IV daily ?Cardiology is consulted, unable to start GDMT medications given low BP. ?Cardiology suggests right and left heart cath once euvolemic possibly in 2 days. ?Monitor daily weight, intake output charting. ? ?Elevated troponins: ?Could be demand ischemia in the setting of PE or CHF. ?Continue heparin for PE.  Plan for right left heart cath once  euvolemic. ?Continue Coreg, cardiology following. ? ?LV thrombus: ?Continue IV heparin. ?Appears like patient will need lifelong anticoagulation. ?Follow-up 2D echocardiogram. ? ?Recurrent pulmonary embolism: ?Likely due to noncompliance. ?Continue heparin drip. ?Likely will transition to Eliquis. ? ?Essential hypertension: ?Continue Coreg 6.25 twice daily. ? ?Hyperlipidemia: ?Continue atorvastatin 80 mg daily ? ?Type 2 diabetes ?Continue regular insulin sliding scale.: ? ?DVT prophylaxis: Heparin gtt. ?Code Status: Full code ?Family Communication: No family at bedside ?Disposition Plan:  Status is: Observation. ? ?Admitted for recurrent PE requiring IV heparin.  Cardiology is consulted patient may require right and left heart catheter once euvolemic. ? ? ?Consultants:  ? Cardiololgy ? ?Procedures: CTA chest. ? ?Antimicrobials:  ?Anti-infectives (From admission, onward)  ? ? None  ? ?  ?  ? ? ?Subjective: ?Patient was seen and examined at bedside.  Overnight events noted.  Patient reports feeling better, sitting comfortably but he still has some shortness of breath.  He denies any chest pain. ? ?Objective: ?Vitals:  ? 03/14/22 1000 03/14/22 1325 03/14/22 1335 03/14/22 1430  ?BP:  109/68  (!) 111/44  ?Pulse: 100 96 95 (!) 102  ?Resp:  20  18  ?Temp:  98.2 ?F (36.8 ?C)  98.2 ?F (36.8 ?C)  ?TempSrc:  Oral    ?SpO2: 98% 99% 100% 98%  ?Weight:    (!) 148.9 kg  ?Height:      ? ?No intake or output data in the 24 hours ending 03/14/22 1501 ?Filed Weights  ? 03/13/22 1526 03/14/22 1430  ?Weight: (!) 147.4 kg (!) 148.9 kg  ? ? ?Examination: ? ?General exam: Appears comfortable, not in any acute distress. ?Respiratory system: Clear  to auscultation bilaterally, normal respiratory effort, RR 15 ?Cardiovascular system: S1-S2 heard, regular rate and rhythm, no murmur. ?Gastrointestinal system: Abdomen is nondistended, soft and nontender. Normal bowel sounds heard. ?Central nervous system: Alert and oriented X 3 . No focal  neurological deficits. ?Extremities: +Edema, no cyanosis, no clubbing. ?Skin: No rashes, lesions or ulcers ?Psychiatry: Judgement and insight appear normal. Mood & affect appropriate.  ? ? ? ?Data Reviewed: I have personally reviewed following labs and imaging studies ? ?CBC: ?Recent Labs  ?Lab 03/13/22 ?1530 03/14/22 ?0503  ?WBC 7.4 8.1  ?HGB 14.6 14.8  ?HCT 44.7 45.5  ?MCV 88.2 88.3  ?PLT 356 383  ? ?Basic Metabolic Panel: ?Recent Labs  ?Lab 03/13/22 ?1530 03/14/22 ?0503  ?NA 130* 130*  ?K 3.8 3.5  ?CL 96* 94*  ?CO2 27 29  ?GLUCOSE 282* 187*  ?BUN 15 13  ?CREATININE 1.21 1.05  ?CALCIUM 8.6* 8.5*  ? ?GFR: ?Estimated Creatinine Clearance: 158.6 mL/min (by C-G formula based on SCr of 1.05 mg/dL). ?Liver Function Tests: ?Recent Labs  ?Lab 03/13/22 ?1530  ?AST 118*  ?ALT 129*  ?ALKPHOS 119  ?BILITOT 1.1  ?PROT 7.3  ?ALBUMIN 2.9*  ? ?Recent Labs  ?Lab 03/13/22 ?1530  ?LIPASE 25  ? ?No results for input(s): AMMONIA in the last 168 hours. ?Coagulation Profile: ?Recent Labs  ?Lab 03/13/22 ?1530  ?INR 1.3*  ? ?Cardiac Enzymes: ?No results for input(s): CKTOTAL, CKMB, CKMBINDEX, TROPONINI in the last 168 hours. ?BNP (last 3 results) ?No results for input(s): PROBNP in the last 8760 hours. ?HbA1C: ?No results for input(s): HGBA1C in the last 72 hours. ?CBG: ?Recent Labs  ?Lab 03/13/22 ?2232 03/14/22 ?0753 03/14/22 ?1229  ?GLUCAP 157* 190* 211*  ? ?Lipid Profile: ?No results for input(s): CHOL, HDL, LDLCALC, TRIG, CHOLHDL, LDLDIRECT in the last 72 hours. ?Thyroid Function Tests: ?No results for input(s): TSH, T4TOTAL, FREET4, T3FREE, THYROIDAB in the last 72 hours. ?Anemia Panel: ?No results for input(s): VITAMINB12, FOLATE, FERRITIN, TIBC, IRON, RETICCTPCT in the last 72 hours. ?Sepsis Labs: ?Recent Labs  ?Lab 03/13/22 ?1530  ?PROCALCITON <0.10  ? ? ?Recent Results (from the past 240 hour(s))  ?Resp Panel by RT-PCR (Flu A&B, Covid) Nasopharyngeal Swab     Status: None  ? Collection Time: 03/14/22  7:59 AM  ? Specimen:  Nasopharyngeal Swab; Nasopharyngeal(NP) swabs in vial transport medium  ?Result Value Ref Range Status  ? SARS Coronavirus 2 by RT PCR NEGATIVE NEGATIVE Final  ?  Comment: (NOTE) ?SARS-CoV-2 target nucleic acids are NOT DETECTED. ? ?The SARS-CoV-2 RNA is generally detectable in upper respiratory ?specimens during the acute phase of infection. The lowest ?concentration of SARS-CoV-2 viral copies this assay can detect is ?138 copies/mL. A negative result does not preclude SARS-Cov-2 ?infection and should not be used as the sole basis for treatment or ?other patient management decisions. A negative result may occur with  ?improper specimen collection/handling, submission of specimen other ?than nasopharyngeal swab, presence of viral mutation(s) within the ?areas targeted by this assay, and inadequate number of viral ?copies(<138 copies/mL). A negative result must be combined with ?clinical observations, patient history, and epidemiological ?information. The expected result is Negative. ? ?Fact Sheet for Patients:  ?BloggerCourse.com ? ?Fact Sheet for Healthcare Providers:  ?SeriousBroker.it ? ?This test is no t yet approved or cleared by the [place] FDA and  ?has been authorized for detection and/or diagnosis of SARS-CoV-2 by ?FDA under an Emergency Use Authorization (EUA). This EUA will remain  ?in effect (meaning this test can  be used) for the duration of the ?COVID-19 declaration under Section 564(b)(1) of the Act, 21 ?U.S.C.section 360bbb-3(b)(1), unless the authorization is terminated  ?or revoked sooner.  ? ? ?  ? Influenza A by PCR NEGATIVE NEGATIVE Final  ? Influenza B by PCR NEGATIVE NEGATIVE Final  ?  Comment: (NOTE) ?The Xpert Xpress SARS-CoV-2/FLU/RSV plus assay is intended as an aid ?in the diagnosis of influenza from Nasopharyngeal swab specimens and ?should not be used as a sole basis for treatment. Nasal washings and ?aspirates are unacceptable for  Xpert Xpress SARS-CoV-2/FLU/RSV ?testing. ? ?Fact Sheet for Patients: ?BloggerCourse.com ? ?Fact Sheet for Healthcare Providers: ?SeriousBroker.it ? ?This test is not y

## 2022-03-14 NOTE — Consult Note (Signed)
ANTICOAGULATION CONSULT NOTE  ? ?Pharmacy Consult for heparin ?Indication: pulmonary embolus ? ?No Known Allergies ? ?Patient Measurements: ?Height: 6' (182.9 cm) ?Weight: (!) 148.9 kg (328 lb 3.2 oz) ?IBW/kg (Calculated) : 77.6 ?Heparin Dosing Weight: 112.1kg ? ?Vital Signs: ?Temp: 97.9 ?F (36.6 ?C) (03/15 1934) ?Temp Source: Oral (03/15 1325) ?BP: 121/85 (03/15 1934) ?Pulse Rate: 105 (03/15 1934) ? ?Labs: ?Recent Labs  ?  03/13/22 ?1527 03/13/22 ?1530 03/13/22 ?1828 03/13/22 ?1828 03/14/22 ?0110 03/14/22 ?0503 03/14/22 ?0800 03/14/22 ?1649 03/14/22 ?2244  ?HGB  --  14.6  --   --   --  14.8  --   --   --   ?HCT  --  44.7  --   --   --  45.5  --   --   --   ?PLT  --  356  --   --   --  383  --   --   --   ?APTT 31  --   --   --  127*  --  99*  --   --   ?LABPROT  --  15.7*  --   --   --   --   --   --   --   ?INR  --  1.3*  --   --   --   --   --   --   --   ?HEPARINUNFRC  --   --  <0.10*   < > 0.67  --   --  0.54 0.48  ?CREATININE  --  1.21  --   --   --  1.05  --   --   --   ?TROPONINIHS  --  22* 24*  --   --   --   --   --   --   ? < > = values in this interval not displayed.  ? ? ? ?Estimated Creatinine Clearance: 158.6 mL/min (by C-G formula based on SCr of 1.05 mg/dL). ? ? ?Medical History: ?Past Medical History:  ?Diagnosis Date  ? 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 a. 05/2021 noted during periods of sleep @ time of hospitalization for PE.  ? Cardiomyopathy (HCC)   ? a. 05/2021 Echo: EF <20%, glob HK. [place]:C ratio of 2.1:1 suggesting non-compaction. Gr2 DD. Sev reduced RV fxn. Nl PASP. Mildly dil RA. Mild MR; b. 10/2021 Echo: EF <20%, GrII DD; c. 10/2021 TEE: EF 20-25%, glob HK, sev red RV fxn.  ? Diabetes mellitus without complication (HCC)   ? HFrEF (heart failure with reduced ejection fraction) (HCC)   ? a. 05/2021 Echo: EF <20%. ? non-compaction.  ? Hypertension   ? Pulmonary embolism (HCC)   ? a. 05/2021 CTA Chest: PE extending from lobar arteris of RUL and RLL w/ concern for pulml infarct-->s/p thrombolysis and  thrombectomy of RUL/RML/RLL.  ? Stroke [facility])   ? a. 10/2021 CT Head: Bilat cerebral infarcts w/ mod-sided acute to subacute R temporoparietal infarct; b. 10/2021 MRI: R post PCA territory infarcts w/o hemorrhage; c. 10/2021 TEE: Neg bubble study. No LA/LAA thrombus. EF 20-25%, glob HK.  ? ? ?Medications:  ?PTA: Apixaban 5mg  BID ?Last dose of Eliquis was 2 days ago ?Inpatient: Heparin drip (3/14 >>>) ?Allergies: NKDA ? ?Assessment: ?26 y.o. male, history of type 2 diabetes, hypertension, HFrEF, PE, obesity, CVA, presents to the emergency department for evaluation of emesis and palpitations x1 month. Found to have new pulmonary embolism in the segmental left upper lobe pulmonary artery, as  well as a right atrial thrombus. Pharmacy consulted for management of heparin drip in the setting of acute pulmonary embolism. ? ?Date Time aPTT/HL Rate/Comment ?3/15 0110 0.67/127 SUPRAtherapeutic/ 2100>1900 ?3/15 0800 ----/99(aPTT) Therapeutic / 1900 units/hr ?3/15 1649 0.54 (HL) Therapeutic  / 1900 units/hr ? ? ?Baseline Labs: ?aPTT - 31 ?Anti-Xa level - <0.1 ?INR - 1.3 ?Hgb - 14.6 ?Plts - 356 ? ?Goal of Therapy:  ?Heparin level 0.3-0.7 units/ml ?aPTT 66-102 seconds ?Monitor platelets by anticoagulation protocol: Yes ? ?Plan:  ?3/15:  HL @ 2244 = 0.48, therapeutic X 3  ?Will continue pt on current rate and recheck HL on 3/16 with AM labs.  ? ? ?Robert Mccann D, PharmD ?03/14/2022 ?11:12 PM ? ? ?

## 2022-03-14 NOTE — ED Notes (Signed)
MD at bedside. 

## 2022-03-15 DIAGNOSIS — E782 Mixed hyperlipidemia: Secondary | ICD-10-CM

## 2022-03-15 DIAGNOSIS — I214 Non-ST elevation (NSTEMI) myocardial infarction: Secondary | ICD-10-CM | POA: Diagnosis not present

## 2022-03-15 DIAGNOSIS — E119 Type 2 diabetes mellitus without complications: Secondary | ICD-10-CM

## 2022-03-15 LAB — CBC
HCT: 41.6 % (ref 39.0–52.0)
Hemoglobin: 13.8 g/dL (ref 13.0–17.0)
MCH: 29 pg (ref 26.0–34.0)
MCHC: 33.2 g/dL (ref 30.0–36.0)
MCV: 87.4 fL (ref 80.0–100.0)
Platelets: 365 10*3/uL (ref 150–400)
RBC: 4.76 MIL/uL (ref 4.22–5.81)
RDW: 12.5 % (ref 11.5–15.5)
WBC: 8 10*3/uL (ref 4.0–10.5)
nRBC: 0 % (ref 0.0–0.2)

## 2022-03-15 LAB — GLUCOSE, CAPILLARY
Glucose-Capillary: 159 mg/dL — ABNORMAL HIGH (ref 70–99)
Glucose-Capillary: 225 mg/dL — ABNORMAL HIGH (ref 70–99)
Glucose-Capillary: 183 mg/dL — ABNORMAL HIGH (ref 70–99)
Glucose-Capillary: 193 mg/dL — ABNORMAL HIGH (ref 70–99)

## 2022-03-15 LAB — BASIC METABOLIC PANEL
Anion gap: 10 (ref 5–15)
BUN: 14 mg/dL (ref 6–20)
CO2: 26 mmol/L (ref 22–32)
Calcium: 8.4 mg/dL — ABNORMAL LOW (ref 8.9–10.3)
Chloride: 96 mmol/L — ABNORMAL LOW (ref 98–111)
Creatinine, Ser: 1.22 mg/dL (ref 0.61–1.24)
GFR, Estimated: >60 mL/min (ref >60)
Glucose, Bld: 140 mg/dL — ABNORMAL HIGH (ref 70–99)
Potassium: 3.4 mmol/L — ABNORMAL LOW (ref 3.5–5.1)
Sodium: 132 mmol/L — ABNORMAL LOW (ref 135–145)

## 2022-03-15 LAB — PHOSPHORUS: Phosphorus: 4.5 mg/dL (ref 2.5–4.6)

## 2022-03-15 LAB — HEMOGLOBIN A1C
Hgb A1c MFr Bld: 8.9 % — ABNORMAL HIGH (ref 4.8–5.6)
Mean Plasma Glucose: 209 mg/dL

## 2022-03-15 LAB — HEPARIN LEVEL (UNFRACTIONATED): Heparin Unfractionated: 0.51 IU/mL (ref 0.30–0.70)

## 2022-03-15 LAB — MAGNESIUM: Magnesium: 1.8 mg/dL (ref 1.7–2.4)

## 2022-03-15 MED ORDER — WARFARIN SODIUM 10 MG PO TABS
10.0000 mg | ORAL_TABLET | Freq: Once | ORAL | Status: AC
  Administered 2022-03-15: 10 mg via ORAL
  Filled 2022-03-15: qty 1

## 2022-03-15 MED ORDER — POTASSIUM CHLORIDE 20 MEQ PO PACK
40.0000 meq | PACK | Freq: Once | ORAL | Status: AC
Start: 2022-03-15 — End: 2022-03-15
  Administered 2022-03-15: 40 meq via ORAL
  Filled 2022-03-15: qty 2

## 2022-03-15 MED ORDER — WARFARIN - PHARMACIST DOSING INPATIENT: Freq: Every day | Status: DC

## 2022-03-15 MED ORDER — MELATONIN 5 MG PO TABS: 5.0000 mg | ORAL_TABLET | Freq: Every day | ORAL | Status: DC

## 2022-03-15 NOTE — Consult Note (Addendum)
? ?  Heart Failure Nurse Navigator Note ? ?HFrEF less than 20%.  Grade 2 diastolic dysfunction.  Large apical thrombus  Noncompaction noted. severe right ventricular systolic function.  Mild mitral regurgitation.  Moderate tricuspid regurgitation. ? ?Presented to the emergency room with complaints of weakness, nausea, vomiting and palpitations.  He had been without his anticoagulation medication for about 2 weeks. ? ?Comorbidities: ? ?Morbid obesity ?Hypertension ?Non-insulin-dependent diabetes ?Nonischemic cardiomyopathy ? ?Medications: ? ?Atorvastatin 80 mg daily ?Carvedilol 6.25 mg 2 times of meals ?Furosemide 40 mg IV daily ? ?Heparin infusion ? ?Labs: ? ?Sodium 132, potassium 3.4, chloride 96, CO2 26, BUN 14, creatinine 1.2, magnesium 1.8, BNP 1083. ?Weight is 149.2 kg ?Intake 991 ?Output 600 mL ?Blood pressure 102/85 ? ? ?Initial meeting with patient today.  His mother was on speaker phone at the time of interview. ? ?Discussed daily weights, he does not weigh himself on a daily basis.  He states he does not watch the amount fluids he drinks. Explained the reasoning behind daily weights being able to report a 3 pound weight gain overnight or 5 pounds within the week. ? ?Also discussed limiting his fluid intake to less than 2000 mL a day.  He states "what do I do when I get thirsty with being on a water pill." ?Given examples of ways to treat thirst without taking in more liquids. Patient has approximately 1100 ml on his over the bed table, he is ordered a 1200 ml fluid restriction.  His nurse made aware. ? ?Discussed low-sodium diet, he states lately when he eats had trouble with nausea and vomiting and has been taking in Ensure. ? ?He was given the living with heart failure teaching booklet along with zone magnet and information on low-sodium. ? ?Pricilla Riffle RN [facility] ?

## 2022-03-15 NOTE — Consult Note (Addendum)
ANTICOAGULATION CONSULT NOTE  ? ?Pharmacy Consult for heparin ?Indication: pulmonary embolus ? ?No Known Allergies ? ?Patient Measurements: ?Height: 6' (182.9 cm) ?Weight: (!) 149.2 kg (328 lb 14.4 oz) ?IBW/kg (Calculated) : 77.6 ?Heparin Dosing Weight: 112.1kg ? ?Vital Signs: ?Temp: 97.9 ?F (36.6 ?C) (03/16 0240) ?BP: 97/63 (03/16 0618) ?Pulse Rate: 89 (03/16 0618) ? ?Labs: ?Recent Labs  ?  03/13/22 ?1527 03/13/22 ?1530 03/13/22 ?1828 03/13/22 ?1828 03/14/22 ?0110 03/14/22 ?0503 03/14/22 ?0800 03/14/22 ?1649 03/14/22 ?2244 03/15/22 ?9735  ?HGB  --  14.6  --   --   --  14.8  --   --   --   --   ?HCT  --  44.7  --   --   --  45.5  --   --   --   --   ?PLT  --  356  --   --   --  383  --   --   --   --   ?APTT 31  --   --   --  127*  --  99*  --   --   --   ?LABPROT  --  15.7*  --   --   --   --   --   --   --   --   ?INR  --  1.3*  --   --   --   --   --   --   --   --   ?HEPARINUNFRC  --   --  <0.10*   < > 0.67  --   --  0.54 0.48 0.51  ?CREATININE  --  1.21  --   --   --  1.05  --   --   --   --   ?TROPONINIHS  --  22* 24*  --   --   --   --   --   --   --   ? < > = values in this interval not displayed.  ? ? ? ?Estimated Creatinine Clearance: 158.7 mL/min (by C-G formula based on SCr of 1.05 mg/dL). ? ? ?Medical History: ?Past Medical History:  ?Diagnosis Date  ? 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 a. 05/2021 noted during periods of sleep @ time of hospitalization for PE.  ? Cardiomyopathy (HCC)   ? a. 05/2021 Echo: EF <20%, glob HK. :C ratio of 2.1:1 suggesting non-compaction. Gr2 DD. Sev reduced RV fxn. Nl PASP. Mildly dil RA. Mild MR; b. 10/2021 Echo: EF <20%, GrII DD; c. 10/2021 TEE: EF 20-25%, glob HK, sev red RV fxn.  ? Diabetes mellitus without complication (HCC)   ? HFrEF (heart failure with reduced ejection fraction) (HCC)   ? a. 05/2021 Echo: EF <20%. ? non-compaction.  ? Hypertension   ? Pulmonary embolism (HCC)   ? a. 05/2021 CTA Chest: PE extending from lobar arteris of RUL and RLL w/ concern for pulml infarct-->s/p  thrombolysis and thrombectomy of RUL/RML/RLL.  ? Stroke [facility])   ? a. 10/2021 CT Head: Bilat cerebral infarcts w/ mod-sided acute to subacute R temporoparietal infarct; b. 10/2021 MRI: R post PCA territory infarcts w/o hemorrhage; c. 10/2021 TEE: Neg bubble study. No LA/LAA thrombus. EF 20-25%, glob HK.  ? ? ?Medications:  ?PTA: Apixaban 5mg  BID ?Last dose of Eliquis was 2 days ago ?Inpatient: Heparin drip  ?Allergies: NKDA ? ?Assessment: ?26 y.o. male, history of type 2 diabetes, hypertension, HFrEF, PE, obesity, CVA, presents to the emergency department for evaluation  of emesis and palpitations x1 month. Found to have new pulmonary embolism in the segmental left upper lobe pulmonary artery, as well as a right atrial thrombus. Pharmacy consulted for management of heparin drip in the setting of acute pulmonary embolism. ? ?Date Time aPTT/HL Rate/Comment ?3/15 0110 0.67/127 SUPRAtherapeutic/ 2100>1900 ?3/15 0800 ----/99(aPTT) Therapeutic / 1900 units/hr ?3/15 1649 0.54 (HL) Therapeutic  / 1900 units/hr ?3/15  2244 0.48 (HL) Therapeutic / 1900 units/hr ?3/16 0653 0.51 (HL) Therapeutic / 1900 units/hr ? ?Baseline Labs: ?aPTT - 31 ?Anti-Xa level  <0.1 ?INR - 1.3 ?Hgb - 14.6 ?Plts - 356 ? ?CBC remains stable. No mention of bleeding per notes. ? ?Goal of Therapy:  ?Heparin level 0.3-0.7 units/ml ?aPTT 66-102 seconds ?Monitor platelets by anticoagulation protocol: Yes ? ?Plan: Heparin remains therapeutic ?Continue heparin 1900 units/hr ?Daily HL, CBC ? ? ?Jaynie Bream, PharmD ?Pharmacy Resident  ?03/15/2022 ?7:53 AM ? ? ? ?

## 2022-03-15 NOTE — Progress Notes (Addendum)
? ? ?Progress Note ? ?Patient Name: Robert Mccann ?Date of Encounter: 03/15/2022 ? ?Primary Cardiologist: Agbor-Etang ? ?Subjective  ? ?Continues to note some cough and dyspnea. No chest pain, palpitations, dizziness, presyncope, or syncope.  ? ?Inpatient Medications  ?  ?Scheduled Meds: ? atorvastatin  80 mg Oral Daily  ? carvedilol  6.25 mg Oral BID WC  ? furosemide  40 mg Intravenous Daily  ? insulin aspart  0-20 Units Subcutaneous TID WC  ? insulin aspart  0-5 Units Subcutaneous QHS  ? ?Continuous Infusions: ? heparin 1,900 Units/hr (03/14/22 1750)  ? ?PRN Meds: ?acetaminophen **OR** acetaminophen, ondansetron **OR** ondansetron (ZOFRAN) IV  ? ?Vital Signs  ?  ?Vitals:  ? 03/14/22 2325 03/15/22 [id number] 03/15/22 [id number] 03/15/22 0825  ?BP: 108/84 97/63  110/72  ?Pulse: 99 89  (!) 110  ?Resp: 18 18  18   ?Temp: 97.7 ?F (36.5 ?C) 97.9 ?F (36.6 ?C)  97.8 ?F (36.6 ?C)  ?TempSrc:      ?SpO2: 99% 98%  97%  ?Weight:   (!) 149.2 kg   ?Height:      ? ? ?Intake/Output Summary (Last 24 hours) at 03/15/2022 0938 ?Last data filed at 03/15/2022 0825 ?Gross per 24 hour  ?Intake 1231.7 ml  ?Output 600 ml  ?Net 631.7 ml  ? ?Filed Weights  ? 03/13/22 1526 03/14/22 1430 03/15/22 0650  ?Weight: (!) 147.4 kg (!) 148.9 kg (!) 149.2 kg  ? ? ?Telemetry  ?  ?SR with artifact - Personally Reviewed ? ?ECG  ?  ?No new tracings - Personally Reviewed ? ?Physical Exam  ? ?GEN: No acute distress.   ?Neck: JVD difficult to assess secondary to body habitus. ?Cardiac: RRR, I/VI systolic murmur LSB, no rubs, or gallops.  ?Respiratory: Clear to auscultation bilaterally.  ?GI: Soft, nontender, non-distended.   ?MS: No edema; No deformity. ?Neuro:  Alert and oriented x 3; Nonfocal.  ?Psych: Normal affect. ? ?Labs  ?  ?Chemistry ?Recent Labs  ?Lab 03/13/22 ?1530 03/14/22 ?0503 03/15/22 ?[id number]  ?NA 130* 130* 132*  ?K 3.8 3.5 3.4*  ?CL 96* 94* 96*  ?CO2 27 29 26   ?GLUCOSE 282* 187* 140*  ?BUN 15 13 14   ?CREATININE 1.21 1.05 1.22  ?CALCIUM 8.6* 8.5* 8.4*  ?PROT  7.3  --   --   ?ALBUMIN 2.9*  --   --   ?AST 118*  --   --   ?ALT 129*  --   --   ?ALKPHOS 119  --   --   ?BILITOT 1.1  --   --   ?GFRNONAA >60 >60 >60  ?ANIONGAP 7 7 10   ?  ? ?Hematology ?Recent Labs  ?Lab 03/13/22 ?1530 03/14/22 ?0503 03/15/22 ?[id number]  ?WBC 7.4 8.1 8.0  ?RBC 5.07 5.15 4.76  ?HGB 14.6 14.8 13.8  ?HCT 44.7 45.5 41.6  ?MCV 88.2 88.3 87.4  ?MCH 28.8 28.7 29.0  ?MCHC 32.7 32.5 33.2  ?RDW 12.5 12.4 12.5  ?PLT 356 383 365  ? ? ?Cardiac EnzymesNo results for input(s): TROPONINI in the last 168 hours. No results for input(s): TROPIPOC in the last 168 hours.  ? ?BNP ?Recent Labs  ?Lab 03/13/22 ?1530  ?BNP 1,083.1*  ?  ? ?DDimer No results for input(s): DDIMER in the last 168 hours.  ? ?Radiology  ?  ?DG Chest 2 View ? ?Result Date: 03/13/2022 ?IMPRESSION: Significant cardiomegaly with moderate pulmonary vascular congestion but no overt edema. Findings are similar compared to prior imaging from June of 2022. No definite acute  cardiopulmonary process. Electronically Signed   By: Malachy Moan M.D.   On: 03/13/2022 15:48  ? ?CT Angio Chest PE W/Cm &/Or Wo Cm ? ?Result Date: 03/13/2022 ?IMPRESSION: 1. Pulmonary embolus of a segmental left upper lobe pulmonary artery, new when compared to prior exam. Additional linear filling defects are seen in bilateral subsegmental at areas of previous pulmonary embolus, likely chronic. 2. New rounded filling defect arising from the wall of the right atrium, concerning for right atrial thrombus. Recommend echocardiography for further evaluation. 3. Small peribronchovascular ground-glass opacity of the left upper lobe, likely infectious or inflammatory. 4. Cardiomegaly. Critical Value/emergent results were called by telephone at the time of interpretation on 03/13/2022 at 5:19 pm to provider [facility] , who verbally acknowledged these results. Electronically Signed   By: Allegra Lai M.D.   On: 03/13/2022 17:21  ? ?US Venous Img Lower  Left (DVT Study) ? ?Result Date:  03/13/2022 ?IMPRESSION: Negative. Electronically Signed   By: Malachy Moan M.D.   On: 03/13/2022 16:46  ? ?Cardiac Studies  ? ?2D echo 03/14/2022: ?1. An LV thrombus measuring 6.0 x 2.7 is noted, attached to the apex.  ?Non-compaction also noted in the inferior and infero lateral walls.  ?Findings suggesting non compaction cardiomyopathy.. Left ventricular  ?ejection fraction, by estimation, is <20%. The  ? left ventricle has severely decreased function. The left ventricle  ?demonstrates global hypokinesis. The left ventricular internal cavity size  ?was mildly dilated. Left ventricular diastolic parameters are consistent  ?with Grade II diastolic dysfunction  ?(pseudonormalization).  ? 2. Right ventricular systolic function is severely reduced. The right  ?ventricular size is moderately enlarged.  ? 3. The mitral valve is normal in structure. Mild mitral valve  ?regurgitation.  ? 4. Tricuspid valve regurgitation is moderate.  ? 5. The aortic valve is tricuspid. Aortic valve regurgitation is not  ?visualized.  ? 6. The inferior vena cava is dilated in size with <50% respiratory  ?variability, suggesting right atrial pressure of 15 mmHg. ? ?Patient Profile  ?   ?26 y.o. 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 ? ?Assessment & Plan  ?  ?1. 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 ?2. Apical mural thrombus: ?-Continue heparin gtt ?-Plan to transition to Coumadin with heparin bridge once it is clear he will not need inpatient invasive testing ?-Consider hematology workup  ? ?3. History of PE s/p thrombectomy and thrombolysis with recurrent new PE this admission: ?-He had been out of Eliquis for ~ 2 weeks ?-Heparin gtt for now with plans to initiate Coumadin as above once it is clear he will not need invasive testing ?-Will likely need indefinite anticoagulation   ? ?4. Elevated high sensitivity troponin: ?-Not consistent with ACS  ?-Likely supply demand ischemia in the setting of recurrent PE ?-Will need cardiac cath with timing to be determined  ?-Continue  heparin gtt as above ? ?5. History of right posterior PCA territory infarcts: ?-No new deficits  ?-Heparin gtt as above ?-Will be on Coumadin moving forward ? ?6. Nocturnal Mobtiz 2 heart block: ?-Noted during prior admission ?-Outpatient sleep study has been recommended, though not yet completed  ? ?7. HTN: ?-Blood pressure stable, soft at times ?-Coreg and IV Lasix as above ? ? ? ?Shared Decision Making/Informed Consent{ ? ?The risks [stroke (1 in 1000), death (1 in 22), kidney failure [usually temporary] (1 in 500), bleeding (1 in 200), allergic reaction [possibly serious] (1 in 200)], benefits (diagnostic support and management of coronary artery disease) and alternatives of a cardiac catheterization were discussed in detail with Mr. Callands and he is willing to proceed.  ? ?   ? ?For questions or updates, please contact [facility] ?Please consult [email or web address] for contact info under Cardiology/STEMI. ?  ? ?Signed, ?Christell Faith, PA-C ?[facility] ?Pager: [phone] ?03/15/2022, 9:38 AM ? ?

## 2022-03-15 NOTE — Progress Notes (Signed)
B ?PROGRESS NOTE ? ? ? ?Robert Mccann  FTD:[id number] DOB: 1996/04/21 DOA: 03/13/2022 ? ?PCP: Nira Retort  ? ?Brief Narrative:  ?This 26 years old male with PMH significant for morbid obesity, hypertension, non-insulin-dependent diabetes mellitus, nonischemic cardiomyopathy ( LVEF less than 20%), history of pulmonary embolism status post thrombectomy in June, 2022 presented in the ED with chief complaints of weakness, nausea, vomiting and shortness of breath.  BNP found to be 1083. Trop 22> 24.  CTA chest showed new pulmonary embolism.  Patient also found to have a rounded filling defect at the wall of the right atrium concerning for right arterial thrombus.  Groundglass opacity probably infectious or inflammatory.  Patient reported he has not been taking anticoagulation for 2 weeks as he ran out of the medications.  Patient started on heparin gtt. ? ?Patient is admitted for new PE, Patient started on IV heparin.  Cardiology is consulted.  Plan for right and left heart cath tomorrow. ? ?Assessment & Plan: ?  ?Principal Problem: ?  NSTEMI (non-ST elevated myocardial infarction) (HCC) ?Active Problems: ?  DM II (diabetes mellitus, type II), controlled (HCC) ?  HTN (hypertension) ?  HFrEF (heart failure with reduced ejection fraction) (HCC) ?  PE (pulmonary thromboembolism) (HCC) ?  Obesity, Class III, BMI 40-49.9 (morbid obesity) (HCC) ?  Obesity ?  Diabetes mellitus type 2, noninsulin dependent (HCC) ?  Hyperlipidemia ?  Acute pulmonary embolism (HCC) ? ?Cardiomyopathy,  Nonischemic: ?Acute on chronic systolic CHF: ?Likely from noncompliance. Last LVEF less than 20%. ?Continue Coreg 6.25 mg twice daily, Continue Lasix 40 mg IV daily ?Cardiology is consulted, unable to start GDMT medications given low BP. ?Cardiology suggests right and left heart cath once euvolemic possibly tomorrow.Marland Kitchen ?Monitor daily weight, intake output charting. ? ? ?Intake/Output Summary (Last 24 hours) at 03/15/2022 1056 ?Last data  filed at 03/15/2022 1022 ?Gross per 24 hour  ?Intake 1351.7 ml  ?Output 600 ml  ?Net 751.7 ml  ?  ? ?Elevated troponins: ?Could be from demand ischemia in the setting of PE or CHF. ?Continue heparin gtt for PE.  Plan for right/ left heart cath once euvolemic. ?Continue Coreg, cardiology following. ? ?LV thrombus: ?Continue IV heparin. ?Appears like patient will need lifelong anticoagulation. ?2D echo shows LVEF less than 20%.  LV thrombus noted. ? ?Recurrent pulmonary embolism: ?Likely due to noncompliance. ?Continue heparin drip. ?Likely will transition to Eliquis. ? ?Essential hypertension: ?Continue Coreg 6.25 twice daily. ? ?Hyperlipidemia: ?Continue atorvastatin 80 mg daily ? ?Type 2 diabetes ?Continue regular insulin sliding scale.: ? ?DVT prophylaxis: Heparin gtt. ?Code Status: Full code ?Family Communication: No family at bedside ? ?Disposition Plan:   ? ?Status is: Inpatient ?Remains inpatient appropriate because: ?  ?Admitted for recurrent PE requiring IV heparin.  Cardiology is consulted, Patient may require right and left heart catheter once euvolemic. ?Tentatively scheduled tomorrow. ? ? ?Consultants:  ? Cardiololgy ? ?Procedures: CTA chest. ? ?Antimicrobials:  ?Anti-infectives (From admission, onward)  ? ? None  ? ?  ?  ?Subjective: ?Patient was seen and examined at bedside.  Overnight events noted.   ?Patient reports feeling better, he was sitting comfortably on the bed. ?He still reports having shortness of breath but denies any chest pain. ? ?Objective: ?Vitals:  ? 03/14/22 2325 03/15/22 0623 03/15/22 7628 03/15/22 0825  ?BP: 108/84 97/63  110/72  ?Pulse: 99 89  (!) 110  ?Resp: 18 18  18   ?Temp: 97.7 ?F (36.5 ?C) 97.9 ?F (36.6 ?C)  97.8 ?F (36.6 ?C)  ?  TempSrc:      ?SpO2: 99% 98%  97%  ?Weight:   (!) 149.2 kg   ?Height:      ? ? ?Intake/Output Summary (Last 24 hours) at 03/15/2022 1056 ?Last data filed at 03/15/2022 1022 ?Gross per 24 hour  ?Intake 1351.7 ml  ?Output 600 ml  ?Net 751.7 ml  ? ?Filed  Weights  ? 03/13/22 1526 03/14/22 1430 03/15/22 0650  ?Weight: (!) 147.4 kg (!) 148.9 kg (!) 149.2 kg  ? ?Examination: ? ?General exam: Appears comfortable, not in any acute distress.  Deconditioned ?Respiratory system: CTA bilaterally, normal respiratory effort, RR 15 ?Cardiovascular system: S1-S2 heard, regular rate and rhythm, no murmur. ?Gastrointestinal system: Abdomen is non distended, soft and non tender. Normal bowel sounds heard. ?Central nervous system: Alert and oriented X 3 . No focal neurological deficits. ?Extremities: +Edema, no cyanosis, no clubbing. ?Skin: No rashes, lesions or ulcers ?Psychiatry: Judgement and insight appear normal. Mood & affect appropriate.  ? ? ? ?Data Reviewed: I have personally reviewed following labs and imaging studies ? ?CBC: ?Recent Labs  ?Lab 03/13/22 ?1530 03/14/22 ?0503 03/15/22 ?5361  ?WBC 7.4 8.1 8.0  ?HGB 14.6 14.8 13.8  ?HCT 44.7 45.5 41.6  ?MCV 88.2 88.3 87.4  ?PLT 356 383 365  ? ?Basic Metabolic Panel: ?Recent Labs  ?Lab 03/13/22 ?1530 03/14/22 ?0503 03/15/22 ?4431  ?NA 130* 130* 132*  ?K 3.8 3.5 3.4*  ?CL 96* 94* 96*  ?CO2 27 29 26   ?GLUCOSE 282* 187* 140*  ?BUN 15 13 14   ?CREATININE 1.21 1.05 1.22  ?CALCIUM 8.6* 8.5* 8.4*  ?MG  --   --  1.8  ?PHOS  --   --  4.5  ? ?GFR: ?Estimated Creatinine Clearance: 136.6 mL/min (by C-G formula based on SCr of 1.22 mg/dL). ?Liver Function Tests: ?Recent Labs  ?Lab 03/13/22 ?1530  ?AST 118*  ?ALT 129*  ?ALKPHOS 119  ?BILITOT 1.1  ?PROT 7.3  ?ALBUMIN 2.9*  ? ?Recent Labs  ?Lab 03/13/22 ?1530  ?LIPASE 25  ? ?No results for input(s): AMMONIA in the last 168 hours. ?Coagulation Profile: ?Recent Labs  ?Lab 03/13/22 ?1530  ?INR 1.3*  ? ?Cardiac Enzymes: ?No results for input(s): CKTOTAL, CKMB, CKMBINDEX, TROPONINI in the last 168 hours. ?BNP (last 3 results) ?No results for input(s): PROBNP in the last 8760 hours. ?HbA1C: ?Recent Labs  ?  03/13/22 ?1616  ?HGBA1C 8.9*  ? ?CBG: ?Recent Labs  ?Lab 03/14/22 ?0753 03/14/22 ?1229  03/14/22 ?1639 03/14/22 ?2040 03/15/22 ?2041  ?GLUCAP 190* 211* 245* 193* 159*  ? ?Lipid Profile: ?No results for input(s): CHOL, HDL, LDLCALC, TRIG, CHOLHDL, LDLDIRECT in the last 72 hours. ?Thyroid Function Tests: ?No results for input(s): TSH, T4TOTAL, FREET4, T3FREE, THYROIDAB in the last 72 hours. ?Anemia Panel: ?No results for input(s): VITAMINB12, FOLATE, FERRITIN, TIBC, IRON, RETICCTPCT in the last 72 hours. ?Sepsis Labs: ?Recent Labs  ?Lab 03/13/22 ?1530  ?PROCALCITON <0.10  ? ? ?Recent Results (from the past 240 hour(s))  ?Resp Panel by RT-PCR (Flu A&B, Covid) Nasopharyngeal Swab     Status: None  ? Collection Time: 03/14/22  7:59 AM  ? Specimen: Nasopharyngeal Swab; Nasopharyngeal(NP) swabs in vial transport medium  ?Result Value Ref Range Status  ? SARS Coronavirus 2 by RT PCR NEGATIVE NEGATIVE Final  ?  Comment: (NOTE) ?SARS-CoV-2 target nucleic acids are NOT DETECTED. ? ?The SARS-CoV-2 RNA is generally detectable in upper respiratory ?specimens during the acute phase of infection. The lowest ?concentration of SARS-CoV-2 viral copies this assay can  detect is ?138 copies/mL. A negative result does not preclude SARS-Cov-2 ?infection and should not be used as the sole basis for treatment or ?other patient management decisions. A negative result may occur with  ?improper specimen collection/handling, submission of specimen other ?than nasopharyngeal swab, presence of viral mutation(s) within the ?areas targeted by this assay, and inadequate number of viral ?copies(<138 copies/mL). A negative result must be combined with ?clinical observations, patient history, and epidemiological ?information. The expected result is Negative. ? ?Fact Sheet for Patients:  ?BloggerCourse.com ? ?Fact Sheet for Healthcare Providers:  ?SeriousBroker.it ? ?This test is no t yet approved or cleared by the [place] FDA and  ?has been authorized for detection and/or diagnosis of  SARS-CoV-2 by ?FDA under an Emergency Use Authorization (EUA). This EUA will remain  ?in effect (meaning this test can be used) for the duration of the ?COVID-19 declaration under Section 564(b)(1) of the Act, 21 ?U

## 2022-03-15 NOTE — Progress Notes (Signed)
ANTICOAGULATION CONSULT NOTE - Initial Consult ? ?Pharmacy Consult for warfarin and heparin bridge ?Indication:  bilateral PE and newly diagnosed apical mural thrombus ? ?No Known Allergies ? ?Patient Measurements: ?Height: 6' (182.9 cm) ?Weight: (!) 149.2 kg (328 lb 14.4 oz) ?IBW/kg (Calculated) : 77.6 ?Heparin Dosing Weight: 112.1 kg ? ?Vital Signs: ?Temp: 97.9 ?F (36.6 ?C) (03/16 1133) ?BP: 102/85 (03/16 1133) ?Pulse Rate: 94 (03/16 1133) ? ?Labs: ?Recent Labs  ?   ?0000 03/13/22 ?1527 03/13/22 ?1530 03/13/22 ?1828 03/13/22 ?1828 03/14/22 ?0110 03/14/22 ?0503 03/14/22 ?0800 03/14/22 ?1649 03/14/22 ?2244 03/15/22 ?[id number]  ?HGB   < >  --  14.6  --   --   --  14.8  --   --   --  13.8  ?HCT  --   --  44.7  --   --   --  45.5  --   --   --  41.6  ?PLT  --   --  356  --   --   --  383  --   --   --  365  ?APTT  --  31  --   --   --  127*  --  99*  --   --   --   ?LABPROT  --   --  15.7*  --   --   --   --   --   --   --   --   ?INR  --   --  1.3*  --   --   --   --   --   --   --   --   ?HEPARINUNFRC  --   --   --  <0.10*   < > 0.67  --   --  0.54 0.48 0.51  ?CREATININE  --   --  1.21  --   --   --  1.05  --   --   --  1.22  ?TROPONINIHS  --   --  22* 24*  --   --   --   --   --   --   --   ? < > = values in this interval not displayed.  ? ? ?Estimated Creatinine Clearance: 136.6 mL/min (by C-G formula based on SCr of 1.22 mg/dL). ? ? ?Medical History: ?Past Medical History:  ?Diagnosis Date  ? 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 a. 05/2021 noted during periods of sleep @ time of hospitalization for PE.  ? Cardiomyopathy ([place])   ? a. 05/2021 Echo: EF <20%, glob HK. [place]:C ratio of 2.1:1 suggesting non-compaction. Gr2 DD. Sev reduced RV fxn. Nl PASP. Mildly dil RA. Mild MR; b. 10/2021 Echo: EF <20%, GrII DD; c. 10/2021 TEE: EF 20-25%, glob HK, sev red RV fxn.  ? Diabetes mellitus without complication ([place])   ? HFrEF (heart failure with reduced ejection fraction) ([place])   ? a. 05/2021 Echo: EF <20%. ? non-compaction.  ? Hypertension   ?  Pulmonary embolism ([place])   ? a. 05/2021 CTA Chest: PE extending from lobar arteris of RUL and RLL w/ concern for pulml infarct-->s/p thrombolysis and thrombectomy of RUL/RML/RLL.  ? Stroke [facility])   ? a. 10/2021 CT Head: Bilat cerebral infarcts w/ mod-sided acute to subacute R temporoparietal infarct; b. 10/2021 MRI: R post PCA territory infarcts w/o hemorrhage; c. 10/2021 TEE: Neg bubble study. No LA/LAA thrombus. EF 20-25%, glob HK.  ? ? ?Medications:  ?Medications Prior to Admission  ?Medication  Sig Dispense Refill Last Dose  ? apixaban (ELIQUIS) 5 MG TABS tablet Take 5 mg by mouth 2 (two) times daily.   Past Week  ? atorvastatin (LIPITOR) 80 MG tablet Take 1 tablet (80 mg total) by mouth daily. 90 tablet 3 Past Week  ? carvedilol (COREG) 6.25 MG tablet Take 1 tablet (6.25 mg total) by mouth 2 (two) times daily with a meal. 180 tablet 3 Past Week  ? FARXIGA 10 MG TABS tablet Take 1 tablet (10 mg total) by mouth daily. 90 tablet 3 Past Week  ? ivabradine (CORLANOR) 5 MG TABS tablet Take 15mg  (3 tablets) TWO hours prior to cardiac CT scan in addition to 100mg  of metoprolol tartrate. 3 tablet 0   ? metFORMIN (GLUCOPHAGE) 500 MG tablet Take 1 tablet (500 mg total) by mouth 2 (two) times daily. 60 tablet 3 Past Week  ? Multiple Vitamin (ONE-A-DAY MENS PO) Take 1 tablet by mouth daily.   Past Week  ? sacubitril-valsartan (ENTRESTO) 24-26 MG Take 1 tablet by mouth 2 (two) times daily. 180 tablet 3 Past Week  ? spironolactone (ALDACTONE) 25 MG tablet Take 0.5 tablets (12.5 mg total) by mouth daily. 45 tablet 3 Past Week  ? Torsemide 40 MG TABS Take 40 mg by mouth 2 (two) times daily. PLEASE SCHEDULE OFFICE VISIT FOR FURTHER REFILLS. THANK YOU! 60 tablet 0 Past Week  ? Biotin 1000 MCG tablet Take 1 tablet (1 mg total) by mouth daily. Chew 1 gummy daily. (Patient not taking: Reported on 03/13/2022) 30 tablet 3 Not Taking  ? metoprolol tartrate (LOPRESSOR) 100 MG tablet Take 1 tablet (100mg ) TWO hours prior to cardiac CT scan.  1 tablet 0   ? metoprolol tartrate (LOPRESSOR) 50 MG tablet Take 1 tablet (50 mg total) by mouth once for 1 dose. Take 2 hours prior to your CT. 1 tablet 0   ? ?Scheduled:  ? atorvastatin  80 mg Oral Daily  ? carvedilol  6.25 mg Oral BID WC  ? furosemide  40 mg Intravenous Daily  ? insulin aspart  0-20 Units Subcutaneous TID WC  ? insulin aspart  0-5 Units Subcutaneous QHS  ? ?Infusions:  ? heparin 1,900 Units/hr (03/14/22 1750)  ? ?PRN: acetaminophen **OR** acetaminophen, ondansetron **OR** ondansetron (ZOFRAN) IV ?Anti-infectives (From admission, onward)  ? ? None  ? ?  ? ? ?Assessment: ?26 y.o. male, history of type 2 diabetes, hypertension, HFrEF, PE, obesity, CVA, presents to the emergency department for evaluation of emesis and palpitations x1 month. Found to have new pulmonary embolism in the segmental left upper lobe pulmonary artery, as well as a right atrial thrombus and possible. Pharmacy consulted for management of heparin drip in the setting of acute pulmonary embolism. ? ?CBC stable. AST/ALT slightly elevated. Cardiology planning for catheterization 3/17. ? ?Goal of Therapy:  ?INR 2-3 ?Heparin level 0.3-0.7 units/ml ?Monitor platelets by anticoagulation protocol: Yes ?  ?Plan: Several risk factors for clotting including T2DM, HTN, PE, hx CVA and obesity. ?Will give warfarin 10 mg x 1 ?Bridging for 5 days and at least two therapeutic levels. Heparin 1900 units/hr ?Next HL 3/17 AM. ?Daily INR, HL, CBC ? ?Wynelle Cleveland, PharmD ?Pharmacy Resident  ?03/15/2022 ?12:33 PM ?

## 2022-03-15 NOTE — Progress Notes (Signed)
Cross Cover ?Patient requesting melatonin for sleep. Ordered daily at bedtime ?

## 2022-03-16 ENCOUNTER — Other Ambulatory Visit: Payer: Self-pay

## 2022-03-16 ENCOUNTER — Other Ambulatory Visit (HOSPITAL_COMMUNITY): Payer: Self-pay

## 2022-03-16 ENCOUNTER — Inpatient Hospital Stay (HOSPITAL_COMMUNITY)
Admission: AD | Admit: 2022-03-16 | Discharge: 2022-03-21 | DRG: 291 | Disposition: A | Discharge status: Home / Self Care | Payer: 59 | Source: Other Acute Inpatient Hospital | Attending: Internal Medicine | Admitting: Internal Medicine

## 2022-03-16 ENCOUNTER — Encounter (HOSPITAL_COMMUNITY): Payer: Self-pay | Admitting: Internal Medicine

## 2022-03-16 ENCOUNTER — Inpatient Hospital Stay: Payer: Self-pay

## 2022-03-16 ENCOUNTER — Inpatient Hospital Stay: Payer: 59

## 2022-03-16 DIAGNOSIS — E611 Iron deficiency: Secondary | ICD-10-CM | POA: Diagnosis present

## 2022-03-16 DIAGNOSIS — I110 Hypertensive heart disease with heart failure: Principal | ICD-10-CM | POA: Diagnosis present

## 2022-03-16 DIAGNOSIS — I2699 Other pulmonary embolism without acute cor pulmonale: Secondary | ICD-10-CM | POA: Diagnosis present

## 2022-03-16 DIAGNOSIS — Z6841 Body Mass Index (BMI) 40.0 and over, adult: Secondary | ICD-10-CM | POA: Diagnosis not present

## 2022-03-16 DIAGNOSIS — E785 Hyperlipidemia, unspecified: Secondary | ICD-10-CM | POA: Diagnosis present

## 2022-03-16 DIAGNOSIS — Z7984 Long term (current) use of oral hypoglycemic drugs: Secondary | ICD-10-CM | POA: Diagnosis not present

## 2022-03-16 DIAGNOSIS — I5020 Unspecified systolic (congestive) heart failure: Secondary | ICD-10-CM | POA: Diagnosis present

## 2022-03-16 DIAGNOSIS — N179 Acute kidney failure, unspecified: Secondary | ICD-10-CM | POA: Diagnosis present

## 2022-03-16 DIAGNOSIS — I10 Essential (primary) hypertension: Secondary | ICD-10-CM | POA: Diagnosis present

## 2022-03-16 DIAGNOSIS — Z79899 Other long term (current) drug therapy: Secondary | ICD-10-CM | POA: Diagnosis not present

## 2022-03-16 DIAGNOSIS — Z833 Family history of diabetes mellitus: Secondary | ICD-10-CM

## 2022-03-16 DIAGNOSIS — I513 Intracardiac thrombosis, not elsewhere classified: Secondary | ICD-10-CM | POA: Diagnosis present

## 2022-03-16 DIAGNOSIS — Z86711 Personal history of pulmonary embolism: Secondary | ICD-10-CM | POA: Diagnosis not present

## 2022-03-16 DIAGNOSIS — E1169 Type 2 diabetes mellitus with other specified complication: Secondary | ICD-10-CM | POA: Diagnosis present

## 2022-03-16 DIAGNOSIS — I5023 Acute on chronic systolic (congestive) heart failure: Secondary | ICD-10-CM

## 2022-03-16 DIAGNOSIS — K7200 Acute and subacute hepatic failure without coma: Secondary | ICD-10-CM | POA: Diagnosis present

## 2022-03-16 DIAGNOSIS — R570 Cardiogenic shock: Secondary | ICD-10-CM | POA: Diagnosis present

## 2022-03-16 DIAGNOSIS — Z823 Family history of stroke: Secondary | ICD-10-CM

## 2022-03-16 DIAGNOSIS — E876 Hypokalemia: Secondary | ICD-10-CM | POA: Diagnosis present

## 2022-03-16 DIAGNOSIS — E1165 Type 2 diabetes mellitus with hyperglycemia: Secondary | ICD-10-CM | POA: Diagnosis present

## 2022-03-16 DIAGNOSIS — K760 Fatty (change of) liver, not elsewhere classified: Secondary | ICD-10-CM | POA: Diagnosis present

## 2022-03-16 DIAGNOSIS — E66813 Obesity, class 3: Secondary | ICD-10-CM | POA: Diagnosis present

## 2022-03-16 DIAGNOSIS — Z8673 Personal history of transient ischemic attack (TIA), and cerebral infarction without residual deficits: Secondary | ICD-10-CM | POA: Diagnosis not present

## 2022-03-16 DIAGNOSIS — I428 Other cardiomyopathies: Secondary | ICD-10-CM | POA: Diagnosis present

## 2022-03-16 DIAGNOSIS — D509 Iron deficiency anemia, unspecified: Secondary | ICD-10-CM | POA: Diagnosis present

## 2022-03-16 DIAGNOSIS — T45516A Underdosing of anticoagulants, initial encounter: Secondary | ICD-10-CM | POA: Diagnosis present

## 2022-03-16 DIAGNOSIS — R946 Abnormal results of thyroid function studies: Secondary | ICD-10-CM | POA: Diagnosis present

## 2022-03-16 DIAGNOSIS — I5082 Biventricular heart failure: Secondary | ICD-10-CM | POA: Diagnosis present

## 2022-03-16 DIAGNOSIS — I2609 Other pulmonary embolism with acute cor pulmonale: Secondary | ICD-10-CM | POA: Diagnosis not present

## 2022-03-16 DIAGNOSIS — I214 Non-ST elevation (NSTEMI) myocardial infarction: Secondary | ICD-10-CM | POA: Diagnosis not present

## 2022-03-16 DIAGNOSIS — E871 Hypo-osmolality and hyponatremia: Secondary | ICD-10-CM | POA: Diagnosis not present

## 2022-03-16 DIAGNOSIS — Z8249 Family history of ischemic heart disease and other diseases of the circulatory system: Secondary | ICD-10-CM

## 2022-03-16 LAB — PROTIME-INR
INR: 1.8 — ABNORMAL HIGH (ref 0.8–1.2)
Prothrombin Time: 20.9 seconds — ABNORMAL HIGH (ref 11.4–15.2)

## 2022-03-16 LAB — CBC
HCT: 42 % (ref 39.0–52.0)
Hemoglobin: 13.8 g/dL (ref 13.0–17.0)
MCH: 28.6 pg (ref 26.0–34.0)
MCHC: 32.9 g/dL (ref 30.0–36.0)
MCV: 87.1 fL (ref 80.0–100.0)
Platelets: 332 10*3/uL (ref 150–400)
RBC: 4.82 MIL/uL (ref 4.22–5.81)
RDW: 12.7 % (ref 11.5–15.5)
WBC: 12.3 10*3/uL — ABNORMAL HIGH (ref 4.0–10.5)
nRBC: 0 % (ref 0.0–0.2)

## 2022-03-16 LAB — COMPREHENSIVE METABOLIC PANEL
ALT: 1733 U/L — ABNORMAL HIGH (ref 0–44)
AST: 3970 U/L — ABNORMAL HIGH (ref 15–41)
Albumin: 2.9 g/dL — ABNORMAL LOW (ref 3.5–5.0)
Alkaline Phosphatase: 124 U/L (ref 38–126)
Anion gap: 10 (ref 5–15)
BUN: 18 mg/dL (ref 6–20)
CO2: 24 mmol/L (ref 22–32)
Calcium: 8.4 mg/dL — ABNORMAL LOW (ref 8.9–10.3)
Chloride: 94 mmol/L — ABNORMAL LOW (ref 98–111)
Creatinine, Ser: 1.41 mg/dL — ABNORMAL HIGH (ref 0.61–1.24)
GFR, Estimated: >60 mL/min (ref >60)
Glucose, Bld: 198 mg/dL — ABNORMAL HIGH (ref 70–99)
Potassium: 3.9 mmol/L (ref 3.5–5.1)
Sodium: 128 mmol/L — ABNORMAL LOW (ref 135–145)
Total Bilirubin: 2.1 mg/dL — ABNORMAL HIGH (ref 0.3–1.2)
Total Protein: 6.4 g/dL — ABNORMAL LOW (ref 6.5–8.1)

## 2022-03-16 LAB — HEPARIN LEVEL (UNFRACTIONATED)
Heparin Unfractionated: 0.14 IU/mL — ABNORMAL LOW (ref 0.30–0.70)
Heparin Unfractionated: 0.38 IU/mL (ref 0.30–0.70)
Heparin Unfractionated: 0.27 IU/mL — ABNORMAL LOW (ref 0.30–0.70)

## 2022-03-16 LAB — GLUCOSE, CAPILLARY
Glucose-Capillary: 188 mg/dL — ABNORMAL HIGH (ref 70–99)
Glucose-Capillary: 274 mg/dL — ABNORMAL HIGH (ref 70–99)
Glucose-Capillary: 206 mg/dL — ABNORMAL HIGH (ref 70–99)
Glucose-Capillary: 206 mg/dL — ABNORMAL HIGH (ref 70–99)

## 2022-03-16 LAB — MRSA NEXT GEN BY PCR, NASAL: MRSA by PCR Next Gen: NOT DETECTED

## 2022-03-16 SURGERY — RIGHT/LEFT HEART CATH AND CORONARY/GRAFT ANGIOGRAPHY
Anesthesia: Moderate Sedation

## 2022-03-16 MED ORDER — SODIUM CHLORIDE 0.9% FLUSH: 3.0000 mL | INTRAVENOUS | Status: DC | PRN

## 2022-03-16 MED ORDER — CARVEDILOL 3.125 MG PO TABS: 3.1250 mg | ORAL_TABLET | Freq: Two times a day (BID) | ORAL | 1 refills | Status: DC

## 2022-03-16 MED ORDER — MILRINONE LACTATE IN DEXTROSE 20-5 MG/100ML-% IV SOLN
0.1250 ug/kg/min | INTRAVENOUS | Status: DC
  Administered 2022-03-16 – 2022-03-19 (×8): 0.25 ug/kg/min via INTRAVENOUS
  Administered 2022-03-19: 0.125 ug/kg/min via INTRAVENOUS
  Filled 2022-03-16 (×10): qty 100

## 2022-03-16 MED ORDER — HEPARIN (PORCINE) 25000 UT/250ML-% IV SOLN: 2300.0000 [IU]/h | INTRAVENOUS | 0 refills | Status: DC

## 2022-03-16 MED ORDER — INSULIN ASPART 100 UNIT/ML IJ SOLN: 0.0000 [IU] | Freq: Three times a day (TID) | INTRAMUSCULAR | Status: DC

## 2022-03-16 MED ORDER — INSULIN ASPART 100 UNIT/ML IJ SOLN
0.0000 [IU] | Freq: Three times a day (TID) | INTRAMUSCULAR | Status: DC
  Administered 2022-03-17: 3 [IU] via SUBCUTANEOUS
  Administered 2022-03-17: 4 [IU] via SUBCUTANEOUS
  Administered 2022-03-17: 7 [IU] via SUBCUTANEOUS
  Administered 2022-03-18: 2 [IU] via SUBCUTANEOUS
  Administered 2022-03-18 – 2022-03-19 (×3): 7 [IU] via SUBCUTANEOUS
  Administered 2022-03-19: 11 [IU] via SUBCUTANEOUS
  Administered 2022-03-19: 4 [IU] via SUBCUTANEOUS
  Administered 2022-03-20: 11 [IU] via SUBCUTANEOUS
  Administered 2022-03-20 (×2): 4 [IU] via SUBCUTANEOUS
  Administered 2022-03-21: 11 [IU] via SUBCUTANEOUS
  Administered 2022-03-21: 4 [IU] via SUBCUTANEOUS

## 2022-03-16 MED ORDER — SODIUM CHLORIDE 0.9 % IV SOLN: 250.0000 mL | INTRAVENOUS | Status: DC | PRN

## 2022-03-16 MED ORDER — CARVEDILOL 3.125 MG PO TABS: 3.1250 mg | ORAL_TABLET | Freq: Two times a day (BID) | ORAL | Status: DC

## 2022-03-16 MED ORDER — FUROSEMIDE 10 MG/ML IJ SOLN
80.0000 mg | Freq: Two times a day (BID) | INTRAMUSCULAR | Status: DC
  Administered 2022-03-16 – 2022-03-18 (×4): 80 mg via INTRAVENOUS
  Filled 2022-03-16 (×4): qty 8

## 2022-03-16 MED ORDER — FUROSEMIDE 10 MG/ML IJ SOLN
40.0000 mg | Freq: Every day | INTRAMUSCULAR | 0 refills | Status: DC
Start: 2022-03-17 — End: 2022-03-21

## 2022-03-16 MED ORDER — ONDANSETRON HCL 4 MG/2ML IJ SOLN
4.0000 mg | Freq: Four times a day (QID) | INTRAMUSCULAR | Status: DC | PRN
  Administered 2022-03-16: 4 mg via INTRAVENOUS
  Filled 2022-03-16: qty 2

## 2022-03-16 MED ORDER — INSULIN ASPART 100 UNIT/ML IJ SOLN
0.0000 [IU] | Freq: Every day | INTRAMUSCULAR | Status: DC
  Administered 2022-03-16 – 2022-03-17 (×2): 2 [IU] via SUBCUTANEOUS
  Administered 2022-03-18: 3 [IU] via SUBCUTANEOUS
  Administered 2022-03-19: 2 [IU] via SUBCUTANEOUS

## 2022-03-16 MED ORDER — DIGOXIN 125 MCG PO TABS
0.1250 mg | ORAL_TABLET | Freq: Every day | ORAL | Status: DC
  Administered 2022-03-16 – 2022-03-21 (×6): 0.125 mg via ORAL
  Filled 2022-03-16 (×6): qty 1

## 2022-03-16 MED ORDER — ACETAMINOPHEN 325 MG PO TABS: 650.0000 mg | ORAL_TABLET | ORAL | Status: DC | PRN

## 2022-03-16 MED ORDER — HEPARIN BOLUS VIA INFUSION
3000.0000 [IU] | Freq: Once | INTRAVENOUS | Status: AC
Start: 2022-03-16 — End: 2022-03-16
  Administered 2022-03-16: 3000 [IU] via INTRAVENOUS
  Filled 2022-03-16: qty 3000

## 2022-03-16 MED ORDER — MELATONIN 5 MG PO TABS
5.0000 mg | ORAL_TABLET | Freq: Every evening | ORAL | Status: DC | PRN
Start: 2022-03-16 — End: 2022-03-21

## 2022-03-16 MED ORDER — FUROSEMIDE 10 MG/ML IJ SOLN: 40.0000 mg | Freq: Every day | INTRAMUSCULAR | Status: DC

## 2022-03-16 MED ORDER — INSULIN ASPART 100 UNIT/ML IJ SOLN: 0.0000 [IU] | Freq: Every day | INTRAMUSCULAR | Status: DC

## 2022-03-16 MED ORDER — HEPARIN (PORCINE) 25000 UT/250ML-% IV SOLN
2400.0000 [IU]/h | INTRAVENOUS | Status: DC
Start: 2022-03-16 — End: 2022-03-20
  Administered 2022-03-17 – 2022-03-18 (×3): 2450 [IU]/h via INTRAVENOUS
  Administered 2022-03-18 – 2022-03-19 (×3): 2400 [IU]/h via INTRAVENOUS
  Filled 2022-03-16 (×7): qty 250

## 2022-03-16 MED ORDER — SODIUM CHLORIDE 0.9% FLUSH
3.0000 mL | Freq: Two times a day (BID) | INTRAVENOUS | Status: DC
  Administered 2022-03-16 – 2022-03-21 (×4): 3 mL via INTRAVENOUS

## 2022-03-16 NOTE — Progress Notes (Signed)
? ? ?Progress Note ? ?Patient Name: Robert Mccann ?Date of Encounter: 03/16/2022 ? ?Primary Cardiologist: Agbor-Etang ? ?Subjective  ? ?Feels like his dyspnea is improving. Cough improving. No chest pain, palpitations, dizziness, presyncope, or syncope. No lower extremity swelling or orthopnea. Only ate breakfast yesterday, otherwise no appetite. Had one episode of nonbloody emesis yesterday that "looked like acid." Noted a green tint to stool as well. Labs this morning are notable for an increase in his AST/ALT from 118/129 to 3970/1733 respectively, albumin 2.9, BUN/SCr 18/1.41, sodium 128, potassium 3.9, INR 1.8 (started warfarin last night). No documented UOP over the past 24 hours. Weight trend 149.2 to 150.2 kg over the past 24 hours.  ? ?Inpatient Medications  ?  ?Scheduled Meds: ? atorvastatin  80 mg Oral Daily  ? carvedilol  3.125 mg Oral BID WC  ? furosemide  40 mg Intravenous Daily  ? insulin aspart  0-20 Units Subcutaneous TID WC  ? insulin aspart  0-5 Units Subcutaneous QHS  ? melatonin  5 mg Oral QHS  ? ?Continuous Infusions: ? heparin 2,300 Units/hr (03/16/22 [id number])  ? ?PRN Meds: ?acetaminophen **OR** acetaminophen, ondansetron **OR** ondansetron (ZOFRAN) IV  ? ?Vital Signs  ?  ?Vitals:  ? 03/15/22 2242 03/16/22 0020 03/16/22 0419 03/16/22 0500  ?BP: 97/70 113/73 120/78   ?Pulse: 93 95 95   ?Resp:  14 18   ?Temp:  98.1 ?F (36.7 ?C) 98.7 ?F (37.1 ?C)   ?TempSrc:  Oral Oral   ?SpO2:  97% 97%   ?Weight:    (!) 150.2 kg  ?Height:      ? ? ?Intake/Output Summary (Last 24 hours) at 03/16/2022 0852 ?Last data filed at 03/16/2022 0500 ?Gross per 24 hour  ?Intake 976.29 ml  ?Output 0 ml  ?Net 976.29 ml  ? ?Filed Weights  ? 03/14/22 1430 03/15/22 0650 03/16/22 0500  ?Weight: (!) 148.9 kg (!) 149.2 kg (!) 150.2 kg  ? ? ?Telemetry  ?  ?SR with heart rates in the 80s to 90s bpm - Personally Reviewed ? ?ECG  ?  ?No new tracings - Personally Reviewed ? ?Physical Exam  ? ?GEN: No acute distress.   ?Neck: JVD elevated  10 cm. ?Cardiac: RRR, no murmurs, rubs, or gallops.  ?Respiratory: Clear to auscultation bilaterally.  ?GI: Soft, nontender, non-distended.   ?MS: No edema; No deformity. ?Neuro:  Alert and oriented x 3; Nonfocal.  ?Psych: Normal affect. ? ?Labs  ?  ?Chemistry ?Recent Labs  ?Lab 03/13/22 ?1530 03/14/22 ?0503 03/15/22 ?[id number] 03/16/22 ?[id number]  ?NA 130* 130* 132* 128*  ?K 3.8 3.5 3.4* 3.9  ?CL 96* 94* 96* 94*  ?CO2 27 29 26 24   ?GLUCOSE 282* 187* 140* 198*  ?BUN 15 13 14 18   ?CREATININE 1.21 1.05 1.22 1.41*  ?CALCIUM 8.6* 8.5* 8.4* 8.4*  ?PROT 7.3  --   --  6.4*  ?ALBUMIN 2.9*  --   --  2.9*  ?AST 118*  --   --  3,970*  ?ALT 129*  --   --  1,733*  ?ALKPHOS 119  --   --  124  ?BILITOT 1.1  --   --  2.1*  ?GFRNONAA >60 >60 >60 >60  ?ANIONGAP 7 7 10 10   ?  ? ?Hematology ?Recent Labs  ?Lab 03/14/22 ?0503 03/15/22 ?[id number] 03/16/22 ?[id number]  ?WBC 8.1 8.0 12.3*  ?RBC 5.15 4.76 4.82  ?HGB 14.8 13.8 13.8  ?HCT 45.5 41.6 42.0  ?MCV 88.3 87.4 87.1  ?MCH 28.7 29.0 28.6  ?MCHC  32.5 33.2 32.9  ?RDW 12.4 12.5 12.7  ?PLT 383 365 332  ? ? ?Cardiac EnzymesNo results for input(s): TROPONINI in the last 168 hours. No results for input(s): TROPIPOC in the last 168 hours.  ? ?BNP ?Recent Labs  ?Lab 03/13/22 ?1530  ?BNP 1,083.1*  ?  ? ?DDimer No results for input(s): DDIMER in the last 168 hours.  ? ?Radiology  ?  ?DG Chest 2 View ?  ?Result Date: 03/13/2022 ?IMPRESSION: Significant cardiomegaly with moderate pulmonary vascular congestion but no overt edema. Findings are similar compared to prior imaging from June of 2022. No definite acute cardiopulmonary process. Electronically Signed   By: Jacqulynn Cadet M.D.   On: 03/13/2022 15:48  ?  ?CT Angio Chest PE W/Cm &/Or Wo Cm ?  ?Result Date: 03/13/2022 ?IMPRESSION: 1. Pulmonary embolus of a segmental left upper lobe pulmonary artery, new when compared to prior exam. Additional linear filling defects are seen in bilateral subsegmental at areas of previous pulmonary embolus, likely chronic. 2. New  rounded filling defect arising from the wall of the right atrium, concerning for right atrial thrombus. Recommend echocardiography for further evaluation. 3. Small peribronchovascular ground-glass opacity of the left upper lobe, likely infectious or inflammatory. 4. Cardiomegaly. Critical Value/emergent results were called by telephone at the time of interpretation on 03/13/2022 at 5:19 pm to provider [facility] , who verbally acknowledged these results. Electronically Signed   By: Yetta Glassman M.D.   On: 03/13/2022 17:21  ?  ?US Venous Img Lower  Left (DVT Study) ?  ?Result Date: 03/13/2022 ?IMPRESSION: Negative. Electronically Signed   By: Jacqulynn Cadet M.D.   On: 03/13/2022 16:46  ? ?Cardiac Studies  ? ?2D echo 03/14/2022: ?1. An LV thrombus measuring 6.0 x 2.7 is noted, attached to the apex.  ?Non-compaction also noted in the inferior and infero lateral walls.  ?Findings suggesting non compaction cardiomyopathy.. Left ventricular  ?ejection fraction, by estimation, is <20%. The  ? left ventricle has severely decreased function. The left ventricle  ?demonstrates global hypokinesis. The left ventricular internal cavity size  ?was mildly dilated. Left ventricular diastolic parameters are consistent  ?with Grade II diastolic dysfunction  ?(pseudonormalization).  ? 2. Right ventricular systolic function is severely reduced. The right  ?ventricular size is moderately enlarged.  ? 3. The mitral valve is normal in structure. Mild mitral valve  ?regurgitation.  ? 4. Tricuspid valve regurgitation is moderate.  ? 5. The aortic valve is tricuspid. Aortic valve regurgitation is not  ?visualized.  ? 6. The inferior vena cava is dilated in size with <50% respiratory  ?variability, suggesting right atrial pressure of 15 mmHg. ?___________ ? ?TEE 11/9.2922: ?1. Left ventricular ejection fraction, by estimation, is 20 to 25%. The  ?left ventricle has severely decreased function. The left ventricle  ?demonstrates global  hypokinesis. The left ventricular internal cavity size  ?was moderately dilated.  ? 2. Right ventricular systolic function is severely reduced. The right  ?ventricular size is normal.  ? 3. No left atrial/left atrial appendage thrombus was detected.  ? 4. The mitral valve is normal in structure. No evidence of mitral valve  ?regurgitation.  ? 5. The aortic valve is tricuspid. Aortic valve regurgitation is not  ?visualized.  ? 6. Agitated saline contrast bubble study was negative, with no evidence  ?of any interatrial shunt.  ? ?Conclusion(s)/Recommendation(s): No LA/LAA thrombus identified. Negative  ?bubble study for interatrial shunt. No intracardiac source of embolism  ?detected on this on this transesophageal echocardiogram. ?__________ ? ?  Patient Profile  ?   ?26 y.o. 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 ? ?Assessment & Plan  ?  ?1. Severe BiV failure/NICM/LV noncompaction: ?-Volume status is difficult on physical exam secondary to body habitus ?-Cannot exclude cardiorenal syndrome and congestive hepatopathy in the context of low output ?-May need inotropic support and would ideally perform a RHC at a minimum, however, this is not a straight forward decision given noted right atrial thrombus on CT imaging this admission ?-Will discuss case with advanced heart failure service in [place] for likely transfer to their service for ongoing aggressive management and therapeutics for his severe heart failure  ?-Decrease Coreg to 3.125 mg bid ?-Continue IV Lasix ?-Escalate GDMT as tolerated ?-Would benefit from cMRI, if weight loss does not preclude ?-CHF  education ?  ?2. Apical mural thrombus: ?-Continue heparin gtt ?-Hold warfarin for now given abnormal LFT as outlined below ?-Plan to transition to Coumadin with heparin bridge once it is clear he will not

## 2022-03-16 NOTE — Discharge Summary (Signed)
?Physician Discharge Summary ?  ?Patient: Robert Mccann MRN: [id number] DOB: 1996/04/06  ?Admit date:     03/13/2022  ?Discharge date: 03/16/22  ?Discharge Physician: Cipriano Bunker  ? ?PCP: Nira Retort  ? ?Recommendations at discharge:  ?Patient is being discharged to [facility] today for left and right heart cath tomorrow. ?Patient remains on heparin for new PE. ?Dr. Gala Romney is accepting physician at [facility]. ? ?Discharge Diagnoses: ?Principal Problem: ?  NSTEMI (non-ST elevated myocardial infarction) (HCC) ?Active Problems: ?  DM II (diabetes mellitus, type II), controlled (HCC) ?  HTN (hypertension) ?  HFrEF (heart failure with reduced ejection fraction) (HCC) ?  PE (pulmonary thromboembolism) (HCC) ?  Obesity, Class III, BMI 40-49.9 (morbid obesity) (HCC) ?  Obesity ?  Diabetes mellitus type 2, noninsulin dependent (HCC) ?  Hyperlipidemia ?  Acute pulmonary embolism (HCC) ? ?Resolved Problems: ?  * No resolved hospital problems. * ? ?Hospital Course: ?This 26 years old male with PMH significant for morbid obesity, hypertension, non-insulin-dependent diabetes mellitus, nonischemic cardiomyopathy ( LVEF less than 20%), history of pulmonary embolism status post thrombectomy in June, 2022 presented in the ED with chief complaints of weakness, Nausea, vomiting and shortness of breath.  BNP found to be 1083. Trop 22> 24.  CTA chest showed new pulmonary embolism.  Patient also found to have a rounded filling defect at the wall of the right atrium concerning for right arterial thrombus.  Groundglass opacity probably infectious or inflammatory.  Patient reported he has not been taking anticoagulation for 2 weeks as he ran out of the medications.  Patient started on heparin gtt. ?Patient was admitted for new PE, likely from noncompliance.  Patient started on IV heparin.  Cardiology was consulted.  Repeat echocardiogram shows LVEF less than 20%.  Patient continued on Coreg and Lasix 40 mg IV daily.  Unable  to start GDMT medication given low blood pressure.  Cardiology recommended  right and left heart cath tomorrow.  Patient is being transferred to [facility] for possible left and right heart cath tomorrow.  Dr. Gala Romney is accepting physician. ? ?Assessment and Plan: ?No notes have been filed under this hospital service. ?Service: Hospitalist ? ?Cardiomyopathy,  Nonischemic: ?Acute on chronic systolic CHF: ?Likely from noncompliance. Last LVEF less than 20%. ?Continue Coreg 3.125 mg twice daily, Continue Lasix 40 mg IV daily ?Cardiology is consulted, unable to start GDMT medications given low BP. ?Cardiology suggests right and left heart cath once euvolemic possibly tomorrow.Marland Kitchen ?Monitor daily weight, intake output charting. ?  ? ?Intake/Output Summary (Last 24 hours) at 03/16/2022 1242 ?Last data filed at 03/16/2022 0500 ?Gross per 24 hour  ?Intake 616.29 ml  ?Output --  ?Net 616.29 ml  ?  ?Elevated troponins: ?Could be from demand ischemia in the setting of PE or CHF. ?Continue heparin gtt for PE.  Plan for right/ left heart cath once euvolemic. ?Continue Coreg, cardiology following. ?  ?LV thrombus: ?Continue IV heparin. ?Appears like patient will need lifelong anticoagulation. ?2D echo shows LVEF less than 20%.  LV thrombus noted. ?  ?Recurrent pulmonary embolism: ?Likely due to noncompliance. ?Continue heparin drip. ?Likely will transition to Eliquis. ?  ?Essential hypertension: ?Continue Coreg 6.25 twice daily. ?  ?Hyperlipidemia: ?Continue atorvastatin 80 mg daily ?  ?Type 2 diabetes ?Continue regular insulin sliding scale.: ? ?Pain control - [place] Controlled Substance Reporting System database was reviewed. and patient was instructed, not to drive, operate heavy machinery, perform activities at heights, swimming or participation in water activities or  provide baby-sitting services while on Pain, Sleep and Anxiety Medications; until their outpatient Physician has advised to do so again. Also recommended  to not to take more than prescribed Pain, Sleep and Anxiety Medications.  ?Consultants: Cardiology ?Procedures performed: Echocardiogram ?Disposition:  [facility] for left and right heart cath tomorrow ?Diet recommendation:  ?Discharge Diet Orders (From admission, onward)  ? ?  Start     Ordered  ? 03/16/22 0000  Diet - low sodium heart healthy       ? 03/16/22 1221  ? 03/16/22 0000  Diet Carb Modified       ? 03/16/22 1221  ? ?  ?  ? ?  ? ?Carb modified diet ?DISCHARGE MEDICATION: ?Allergies as of 03/16/2022   ?No Known Allergies ?  ? ?  ?Medication List  ?  ? ?STOP taking these medications   ? ?apixaban 5 MG Tabs tablet ?Commonly known as: ELIQUIS ?  ?atorvastatin 80 MG tablet ?Commonly known as: LIPITOR ?  ?Biotin 1000 MCG tablet ?  ?Farxiga 10 MG Tabs tablet ?Generic drug: dapagliflozin propanediol ?  ?ivabradine 5 MG Tabs tablet ?Commonly known as: Corlanor ?  ?metFORMIN 500 MG tablet ?Commonly known as: GLUCOPHAGE ?  ?metoprolol tartrate 100 MG tablet ?Commonly known as: LOPRESSOR ?  ?metoprolol tartrate 50 MG tablet ?Commonly known as: LOPRESSOR ?  ?ONE-A-DAY MENS PO ?  ?sacubitril-valsartan 24-26 MG ?Commonly known as: ENTRESTO ?  ?spironolactone 25 MG tablet ?Commonly known as: ALDACTONE ?  ?Torsemide 40 MG Tabs ?  ? ?  ? ?TAKE these medications   ? ?carvedilol 3.125 MG tablet ?Commonly known as: COREG ?Take 1 tablet (3.125 mg total) by mouth 2 (two) times daily with a meal. ?What changed:  ?medication strength ?how much to take ?  ?furosemide 10 MG/ML injection ?Commonly known as: LASIX ?Inject 4 mLs (40 mg total) into the vein daily. ?Start taking on: March 17, 2022 ?  ?heparin 62035 UT/250ML infusion ?Inject 2,300 Units/hr into the vein continuous. ?  ? ?  ? ? Follow-up Information   ? ? [facility] Follow up in 1 week(s).   ?Contact information: ?[address] ?[place] [address] ?[phone] ? ? ?  ?  ? ? Debbe Odea, MD .   ?Specialties: Cardiology, Radiology ?Contact  information: ?[address] ?[place] [address] ?[phone] ? ? ?  ?  ? ?  ?  ? ?  ? ?Discharge Exam: ?Filed Weights  ? 03/14/22 1430 03/15/22 0650 03/16/22 0500  ?Weight: (!) 148.9 kg (!) 149.2 kg (!) 150.2 kg  ? ?General exam: Appears comfortable, not in any acute distress.  Deconditioned ?Respiratory system: Decreased breath sounds, normal respiratory effort, no accessory muscle use. ?Cardiovascular system: S1-S2 heard, regular rate and rhythm, no murmur. ?Gastrointestinal system: Abdomen is soft, distended, nontender, BS+ ?Central nervous system: Alert, oriented x3, no focal neurological deficits. ?Extremities: Edema+, no cyanosis, no clubbing. ?Psychiatry: Mood, insight, judgment normal. ? ? ?Condition at discharge: good ? ?The results of significant diagnostics from this hospitalization (including imaging, microbiology, ancillary and laboratory) are listed below for reference.  ? ?Imaging Studies: ?DG Chest 2 View ? ?Result Date: 03/13/2022 ?CLINICAL DATA:  Weakness, fatigue EXAM: CHEST - 2 VIEW COMPARISON:  Prior chest x-ray 06/27/2021 FINDINGS: Similar degree of marked enlargement of the cardiopericardial silhouette. Pulmonary vascular congestion is present without overt edema. No focal airspace infiltrate, pleural effusion or pneumothorax. No acute osseous abnormality. IMPRESSION: Significant cardiomegaly with moderate pulmonary vascular congestion but no overt  edema. Findings are similar compared to prior imaging from June of 2022. No definite acute cardiopulmonary process. Electronically Signed   By: Malachy Moan M.D.   On: 03/13/2022 15:48  ? ?CT Angio Chest PE W/Cm &/Or Wo Cm ? ?Result Date: 03/13/2022 ?CLINICAL DATA:  Heart racing EXAM: CT ANGIOGRAPHY CHEST WITH CONTRAST TECHNIQUE: Multidetector CT imaging of the chest was performed using the standard protocol during bolus administration of intravenous contrast. Multiplanar CT image reconstructions and MIPs were obtained to evaluate the  vascular anatomy. RADIATION DOSE REDUCTION: This exam was performed according to the departmental dose-optimization program which includes automated exposure control, adjustment of the mA and/or kV according

## 2022-03-16 NOTE — Progress Notes (Signed)
ANTICOAGULATION CONSULT NOTE - Initial Consult ? ?Pharmacy Consult for heparin ?Indication:  bilateral PE and newly diagnosed apical mural thrombus ? ?No Known Allergies ? ?Patient Measurements: ?Height: 6' (182.9 cm) ?Weight: (!) 149.1 kg (328 lb 11.3 oz) ?IBW/kg (Calculated) : 77.6 ?Heparin Dosing Weight: 112.1 kg ? ?Vital Signs: ?Temp: 98 ?F (36.7 ?C) (03/17 1931) ?Temp Source: Oral (03/17 1931) ?BP: 106/82 (03/17 1931) ?Pulse Rate: 92 (03/17 1952) ? ?Labs: ?Recent Labs  ?   ?0000 03/14/22 ?0110 03/14/22 ?0503 03/14/22 ?0800 03/14/22 ?1649 03/15/22 ?2094 03/16/22 ?7096 03/16/22 ?1255 03/16/22 ?2001  ?HGB   < >  --  14.8  --   --  13.8 13.8  --   --   ?HCT  --   --  45.5  --   --  41.6 42.0  --   --   ?PLT  --   --  383  --   --  365 332  --   --   ?APTT  --  127*  --  99*  --   --   --   --   --   ?LABPROT  --   --   --   --   --   --  20.9*  --   --   ?INR  --   --   --   --   --   --  1.8*  --   --   ?HEPARINUNFRC  --  0.67  --   --    < > 0.51 0.14* 0.38 0.27*  ?CREATININE  --   --  1.05  --   --  1.22 1.41*  --   --   ? < > = values in this interval not displayed.  ? ? ? ?Estimated Creatinine Clearance: 118.2 mL/min (A) (by C-G formula based on SCr of 1.41 mg/dL (H)). ? ? ?Medical History: ?Past Medical History:  ?Diagnosis Date  ? 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 a. 05/2021 noted during periods of sleep @ time of hospitalization for PE.  ? Cardiomyopathy (HCC)   ? a. 05/2021 Echo: EF <20%, glob HK. Langston:C ratio of 2.1:1 suggesting non-compaction. Gr2 DD. Sev reduced RV fxn. Nl PASP. Mildly dil RA. Mild MR; b. 10/2021 Echo: EF <20%, GrII DD; c. 10/2021 TEE: EF 20-25%, glob HK, sev red RV fxn.  ? Diabetes mellitus without complication (HCC)   ? HFrEF (heart failure with reduced ejection fraction) (HCC)   ? a. 05/2021 Echo: EF <20%. ? non-compaction.  ? Hypertension   ? Pulmonary embolism (HCC)   ? a. 05/2021 CTA Chest: PE extending from lobar arteris of RUL and RLL w/ concern for pulml infarct-->s/p thrombolysis and  thrombectomy of RUL/RML/RLL.  ? Stroke [facility])   ? a. 10/2021 CT Head: Bilat cerebral infarcts w/ mod-sided acute to subacute R temporoparietal infarct; b. 10/2021 MRI: R post PCA territory infarcts w/o hemorrhage; c. 10/2021 TEE: Neg bubble study. No LA/LAA thrombus. EF 20-25%, glob HK.  ? ? ?Medications:  ?Medications Prior to Admission  ?Medication Sig Dispense Refill Last Dose  ? carvedilol (COREG) 3.125 MG tablet Take 1 tablet (3.125 mg total) by mouth 2 (two) times daily with a meal. 60 tablet 1 03/16/2022 at 0800  ? [START ON 03/17/2022] furosemide (LASIX) 10 MG/ML injection Inject 4 mLs (40 mg total) into the vein daily. 4 mL 0 03/16/2022  ? heparin 28366 UT/250ML infusion Inject 2,300 Units/hr into the vein continuous. 250 mL 0 03/16/2022  ? ?  Scheduled:  ? digoxin  0.125 mg Oral Daily  ? furosemide  80 mg Intravenous BID  ? [START ON 03/17/2022] insulin aspart  0-20 Units Subcutaneous TID WC  ? insulin aspart  0-5 Units Subcutaneous QHS  ? sodium chloride flush  3 mL Intravenous Q12H  ? ?Infusions:  ? sodium chloride    ? heparin    ? milrinone 0.25 mcg/kg/min (03/16/22 1952)  ? ?PRN: sodium chloride, acetaminophen, melatonin, ondansetron (ZOFRAN) IV, sodium chloride flush ?Anti-infectives (From admission, onward)  ? ? None  ? ?  ? ? ?Assessment: ?26 y.o. male, history of type 2 diabetes, hypertension, HFrEF, PE, obesity, CVA, presents to the emergency department for evaluation of emesis and palpitations x1 month. Found to have new pulmonary embolism in the segmental left upper lobe pulmonary artery, as well as a right atrial thrombus and possible. Pharmacy consulted for management of heparin drip in the setting of acute pulmonary embolism. ? ?CBC stable. AST/ALT slightly elevated.  ?Transferred to [facility] ?Heparin drip 2300 uts/hr heparin level fell slightly 0.27  ? ?Goal of Therapy:  ?INR 2-3 ?Heparin level 0.3-0.7 units/ml ?Monitor platelets by anticoagulation protocol: Yes ?  ?Plan: ?Increase Heparin drip 2450  uts/hr ?Daily heparin level and CBC ?Monitor for s/s bleeding ? ? ?Leota Sauers Pharm.D. CPP, BCPS ?Clinical Pharmacist ?[phone] ?03/16/2022 8:51 PM  ? ? ?

## 2022-03-16 NOTE — H&P (Addendum)
?Cardiology Admission History and Physical:  ? ?Patient ID: Robert Mccann ?MRN: [id number]; DOB: 09/24/1996  ? ?Admission date: 03/16/2022 ? ?PCP:  Nira Retort ?  ?[facility] Prov[facility] ?Cardiologist:  Debbe Odea, MD   { ? ? ?Chief Complaint:  Dyspnea ? ?Patient Profile:  ? ?Robert Mccann is a 26 y.o. 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 ? ?History of Present Illness:  ? ?Robert Mccann initially presented to [facility] in 05/2021 with chest pain and dyspnea.  CT of the chest revealed pulmonary emboli extending from the lobar arteries of the right upper and lower lobes and throughout their segmental branches.  He subsequently underwent thrombectomy and thrombolysis of the right upper/middle/lower lobes.  He was treated with heparin and subsequently Eliquis.  Lower extremity ultrasound was negative for DVT though it was noted that the patient previously spent 12 hours each way to [facility] and [place] and back about a week or 2 prior to admission.  In the setting of work-up for PE, echo was performed and showed an EF of less than 20% with global hypokinesis with grade 2 diastolic dysfunction and severely reduced RV function.  There was concern for left ventricular noncompaction with recommendation for cMRI as an outpatient.  On telemetry during hospitalization, he was also noted to have intermittent Mobitz 2 heart block during periods of sleep with recommendation for outpatient sleep study.  He was placed on heart failure medications including low-dose carvedilol, Entresto, and Farxiga.  At follow-up in 06/2021, he was taking only carvedilol, Eliquis, and low-dose Lasix, as he was not able to afford [place] or Entresto.  At that visit, he was provided with Entresto  samples and a co-pay reduction card.  At follow-up in 08/2021, he was placed on spironolactone and Lasix was switched to torsemide.   ?  ?He was admitted to the hospital in 10/2021 with a 2 to 3-day history of dysarthria and left hand numbness/weakness.  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  TEE during admission did not show any LA/LAA/LV thrombus, and bubble study was negative.  He reported compliance with Eliquis, which was held in the setting of areas of petechial hemorrhage on follow-up CT.  We discussed potentially switching to warfarin however, patient preferred to remain on Eliquis, and this was resumed once cleared by neurology.   ? ?At his last outpatient visit in 12/2021 his weight was down 41 pounds.  He was scheduled for coronary CTA to rule out obstructive disease contributing to his cardiomyopathy, though this could not be completed secondary to inability to obtain adequate heart rate.  He reported adherence to his medications. ? ?He was admitted to [facility] on 03/13/2022 dyspnea, fatigue, chest discomfort, and emesis.  He reported he had not taken his Eliquis in 2 weeks.  He was found to have new pulmonary embolus of the segmental left upper lobe pulmonary artery as well as additional linear filling defects noted in the bilateral subsegmental areas of previous pulmonary embolus felt to be chronic, newly identified rounded filling defect arising from the wall of the right atrium concerning for right atrial thrombus.  Lower extremity ultrasound negative for DVT.  Echo demonstrated an EF less than 20%, global hypokinesis, mildly dilated LV internal cavity size, grade 2 diastolic dysfunction, severely reduced  RV systolic function with moderately enlarged RV cavity size, mild mitral regurgitation, moderate tricuspid regurgitation, and an estimated right atrial pressure 15 mmHg.  He was placed on IV heparin, IV Lasix, and carvedilol.  Blood pressure has precluded escalation of  GDMT.  Diagnostic R/LHC has been deferred given concern for right atrial thrombus and mural thrombus.  He was initially placed on warfarin on the evening of 3/16, though with noted worsening LFTs, this was discontinued on the morning of 3/17.  He has been continued on heparin drip given the above.  Abdominal ultrasound on the morning of 3/17 that showed gallbladder wall thickening with no gallstones, CBD dilation, or sonographic Murphy sign.  Hepatic steatosis was noted.  Case has been discussed with the advanced heart failure service with recommendation to transfer to [facility] for ongoing evaluation, management and advanced therapeutics. ? ? ?Past Medical History:  ?Diagnosis Date  ? 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 a. 05/2021 noted during periods of sleep @ time of hospitalization for PE.  ? Cardiomyopathy ([place])   ? a. 05/2021 Echo: EF <20%, glob HK. [place]:C ratio of 2.1:1 suggesting non-compaction. Gr2 DD. Sev reduced RV fxn. Nl PASP. Mildly dil RA. Mild MR; b. 10/2021 Echo: EF <20%, GrII DD; c. 10/2021 TEE: EF 20-25%, glob HK, sev red RV fxn.  ? Diabetes mellitus without complication ([place])   ? HFrEF (heart failure with reduced ejection fraction) ([place])   ? a. 05/2021 Echo: EF <20%. ? non-compaction.  ? Hypertension   ? Pulmonary embolism ([place])   ? a. 05/2021 CTA Chest: PE extending from lobar arteris of RUL and RLL w/ concern for pulml infarct-->s/p thrombolysis and thrombectomy of RUL/RML/RLL.  ? Stroke [facility])   ? a. 10/2021 CT Head: Bilat cerebral infarcts w/ mod-sided acute to subacute R temporoparietal infarct; b. 10/2021 MRI: R post PCA territory infarcts w/o hemorrhage; c. 10/2021 TEE: Neg bubble study. No LA/LAA thrombus. EF 20-25%, glob HK.  ? ? ?Past Surgical History:  ?Procedure Laterality Date  ? HAND SURGERY Left age 35  ? HAND SURGERY    ? PULMONARY THROMBECTOMY N/A 06/28/2021  ? Procedure: PULMONARY THROMBECTOMY;  Surgeon: Algernon Huxley, MD;  Location: [facility];  Service: Cardiovascular;  Laterality: N/A;   ? TEE WITHOUT CARDIOVERSION N/A 11/08/2021  ? Procedure: TRANSESOPHAGEAL ECHOCARDIOGRAM (TEE);  Surgeon: Kate Sable, MD;  Location: [facility] ORS;  Service: Cardiovascular;  Laterality: N/A;  ?  ? ?Medications Prior to Admission: ?Prior to Admission medications   ?Medication Sig Start Date End Date Taking? Authorizing Provider  ?ivabradine (CORLANOR) 5 MG TABS tablet Take 15mg  (3 tablets) TWO hours prior to cardiac CT scan in addition to 100mg  of metoprolol tartrate. 01/31/22   Kate Sable, MD  ?apixaban (ELIQUIS) 5 MG TABS tablet Take 5 mg by mouth 2 (two) times daily.    [provider]  ?atorvastatin (LIPITOR) 80 MG tablet Take 1 tablet (80 mg total) by mouth daily. 01/17/22   Theora Gianotti, NP  ?Biotin 1000 MCG tablet Take 1 tablet (1 mg total) by mouth daily. Chew 1 gummy daily. ?Patient not taking: Reported on 03/13/2022 06/30/21   Fritzi Mandes, MD  ?carvedilol (COREG) 6.25 MG tablet Take 1 tablet (6.25 mg total) by mouth 2 (two) times daily with a meal. 01/17/22   Theora Gianotti, NP  ?FARXIGA 10 MG TABS tablet Take 1 tablet (10 mg total) by mouth daily. 01/17/22   Theora Gianotti, NP  ?metFORMIN (GLUCOPHAGE) 500 MG tablet  Take 1 tablet (500 mg total) by mouth 2 (two) times daily. 06/30/21   Fritzi Mandes, MD  ?metoprolol tartrate (LOPRESSOR) 100 MG tablet Take 1 tablet (100mg ) TWO hours prior to cardiac CT scan. 01/31/22   Kate Sable, MD  ?metoprolol tartrate (LOPRESSOR) 50 MG tablet Take 1 tablet (50 mg total) by mouth once for 1 dose. Take 2 hours prior to your CT. 01/17/22 01/17/22  Theora Gianotti, NP  ?Multiple Vitamin (ONE-A-DAY MENS PO) Take 1 tablet by mouth daily.    [provider]  ?sacubitril-valsartan (ENTRESTO) 24-26 MG Take 1 tablet by mouth 2 (two) times daily. 01/17/22   Theora Gianotti, NP  ?spironolactone (ALDACTONE) 25 MG tablet Take 0.5 tablets (12.5 mg total) by mouth daily. 01/17/22   Theora Gianotti, NP   ?Torsemide 40 MG TABS Take 40 mg by mouth 2 (two) times daily. PLEASE SCHEDULE OFFICE VISIT FOR FURTHER REFILLS. THANK YOU! 03/07/22   Theora Gianotti, NP  ?  ? ?Allergies:   No Known Allergies ? ?S

## 2022-03-16 NOTE — Plan of Care (Signed)

## 2022-03-16 NOTE — Progress Notes (Signed)
ANTICOAGULATION CONSULT NOTE - Initial Consult ? ?Pharmacy Consult for warfarin and heparin bridge ?Indication:  bilateral PE and newly diagnosed apical mural thrombus ? ?No Known Allergies ? ?Patient Measurements: ?Height: 6' (182.9 cm) ?Weight: (!) 150.2 kg (331 lb 3.2 oz) ?IBW/kg (Calculated) : 77.6 ?Heparin Dosing Weight: 112.1 kg ? ?Vital Signs: ?Temp: 98.7 ?F (37.1 ?C) (03/17 0419) ?Temp Source: Oral (03/17 0419) ?BP: 120/78 (03/17 0419) ?Pulse Rate: 95 (03/17 0419) ? ?Labs: ?Recent Labs  ?   ?0000 03/13/22 ?1527 03/13/22 ?1530 03/13/22 ?1828 03/13/22 ?1828 03/14/22 ?0110 03/14/22 ?0503 03/14/22 ?0800 03/14/22 ?1649 03/14/22 ?2244 03/15/22 ?9528 03/16/22 ?4132  ?HGB   < >  --  14.6  --   --   --  14.8  --   --   --  13.8 13.8  ?HCT   < >  --  44.7  --   --   --  45.5  --   --   --  41.6 42.0  ?PLT   < >  --  356  --   --   --  383  --   --   --  365 332  ?APTT  --  31  --   --   --  127*  --  99*  --   --   --   --   ?LABPROT  --   --  15.7*  --   --   --   --   --   --   --   --  20.9*  ?INR  --   --  1.3*  --   --   --   --   --   --   --   --  1.8*  ?HEPARINUNFRC  --   --   --  <0.10*   < > 0.67  --   --    < > 0.48 0.51 0.14*  ?CREATININE  --   --  1.21  --   --   --  1.05  --   --   --  1.22  --   ?TROPONINIHS  --   --  22* 24*  --   --   --   --   --   --   --   --   ? < > = values in this interval not displayed.  ? ? ? ?Estimated Creatinine Clearance: 137.1 mL/min (by C-G formula based on SCr of 1.22 mg/dL). ? ? ?Medical History: ?Past Medical History:  ?Diagnosis Date  ? 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 a. 05/2021 noted during periods of sleep @ time of hospitalization for PE.  ? Cardiomyopathy (H[facility])   ? a. 05/2021 Echo: EF <20%, glob HK. [place]:C ratio of 2.1:1 suggesting non-compaction. Gr2 DD. Sev reduced RV fxn. Nl PASP. Mildly dil RA. Mild MR; b. 10/2021 Echo: EF <20%, GrII DD; c. 10/2021 TEE: EF 20-25%, glob HK, sev red RV fxn.  ? Diabetes mellitus without complication (HCC)   ? HFrEF (heart failure with reduced  ejection fraction) (HCC)   ? a. 05/2021 Echo: EF <20%. ? non-compaction.  ? Hypertension   ? Pulmonary embolism (H[facility])   ? a. 05/2021 CTA Chest: PE extending from lobar arteris of RUL and RLL w/ concern for pulml infarct-->s/p thrombolysis and thrombectomy of RUL/RML/RLL.  ? Stroke [facility])   ? a. 10/2021 CT Head: Bilat cerebral infarcts w/ mod-sided acute to subacute R temporoparietal infarct; b. 10/2021 MRI: R post PCA  territory infarcts w/o hemorrhage; c. 10/2021 TEE: Neg bubble study. No LA/LAA thrombus. EF 20-25%, glob HK.  ? ? ?Medications:  ?Medications Prior to Admission  ?Medication Sig Dispense Refill Last Dose  ? apixaban (ELIQUIS) 5 MG TABS tablet Take 5 mg by mouth 2 (two) times daily.   Past Week  ? atorvastatin (LIPITOR) 80 MG tablet Take 1 tablet (80 mg total) by mouth daily. 90 tablet 3 Past Week  ? carvedilol (COREG) 6.25 MG tablet Take 1 tablet (6.25 mg total) by mouth 2 (two) times daily with a meal. 180 tablet 3 Past Week  ? FARXIGA 10 MG TABS tablet Take 1 tablet (10 mg total) by mouth daily. 90 tablet 3 Past Week  ? ivabradine (CORLANOR) 5 MG TABS tablet Take 15mg  (3 tablets) TWO hours prior to cardiac CT scan in addition to 100mg  of metoprolol tartrate. 3 tablet 0   ? metFORMIN (GLUCOPHAGE) 500 MG tablet Take 1 tablet (500 mg total) by mouth 2 (two) times daily. 60 tablet 3 Past Week  ? Multiple Vitamin (ONE-A-DAY MENS PO) Take 1 tablet by mouth daily.   Past Week  ? sacubitril-valsartan (ENTRESTO) 24-26 MG Take 1 tablet by mouth 2 (two) times daily. 180 tablet 3 Past Week  ? spironolactone (ALDACTONE) 25 MG tablet Take 0.5 tablets (12.5 mg total) by mouth daily. 45 tablet 3 Past Week  ? Torsemide 40 MG TABS Take 40 mg by mouth 2 (two) times daily. PLEASE SCHEDULE OFFICE VISIT FOR FURTHER REFILLS. THANK YOU! 60 tablet 0 Past Week  ? Biotin 1000 MCG tablet Take 1 tablet (1 mg total) by mouth daily. Chew 1 gummy daily. (Patient not taking: Reported on 03/13/2022) 30 tablet 3 Not Taking  ? metoprolol  tartrate (LOPRESSOR) 100 MG tablet Take 1 tablet (100mg ) TWO hours prior to cardiac CT scan. 1 tablet 0   ? metoprolol tartrate (LOPRESSOR) 50 MG tablet Take 1 tablet (50 mg total) by mouth once for 1 dose. Take 2 hours prior to your CT. 1 tablet 0   ? ?Scheduled:  ? atorvastatin  80 mg Oral Daily  ? carvedilol  6.25 mg Oral BID WC  ? furosemide  40 mg Intravenous Daily  ? heparin  3,000 Units Intravenous Once  ? insulin aspart  0-20 Units Subcutaneous TID WC  ? insulin aspart  0-5 Units Subcutaneous QHS  ? melatonin  5 mg Oral QHS  ? Warfarin - Pharmacist Dosing Inpatient   Does not apply q1600  ? ?Infusions:  ? heparin 1,900 Units/hr (03/16/22 0122)  ? ?PRN: acetaminophen **OR** acetaminophen, ondansetron **OR** ondansetron (ZOFRAN) IV ?Anti-infectives (From admission, onward)  ? ? None  ? ?  ? ? ?Assessment: ?26 y.o. male, history of type 2 diabetes, hypertension, HFrEF, PE, obesity, CVA, presents to the emergency department for evaluation of emesis and palpitations x1 month. Found to have new pulmonary embolism in the segmental left upper lobe pulmonary artery, as well as a right atrial thrombus and possible. Pharmacy consulted for management of heparin drip in the setting of acute pulmonary embolism. ? ?CBC stable. AST/ALT slightly elevated. Cardiology planning for catheterization 3/17. ? ?Goal of Therapy:  ?INR 2-3 ?Heparin level 0.3-0.7 units/ml ?Monitor platelets by anticoagulation protocol: Yes ?  ?Plan: Several risk factors for clotting including T2DM, HTN, PE, hx CVA and obesity. ?Will give warfarin 10 mg x 1 ?Bridging for 5 days and at least two therapeutic levels. Heparin 1900 units/hr ? ?3/17: HL @ 0546 = 0.14, SUBtherapeutic ?Will order heparin 3000  units IV X 1 bolus and increase drip rate to 2300 units/hr. ?Will recheck HL 6 hrs after rate change.  ? ?Joenathan Sakuma D, PharmD ?03/16/2022 ?6:45 AM ?

## 2022-03-16 NOTE — Progress Notes (Signed)
ANTICOAGULATION CONSULT NOTE  ? ?Pharmacy Consult for heparin  ?Indication:  bilateral PE and newly diagnosed apical mural thrombus ? ?No Known Allergies ? ?Patient Measurements: ?Height: 6' (182.9 cm) ?Weight: (!) 150.2 kg (331 lb 3.2 oz) ?IBW/kg (Calculated) : 77.6 ?Heparin Dosing Weight: 112.1 kg ? ?Vital Signs: ?Temp: 98 ?F (36.7 ?C) (03/17 1228) ?Temp Source: Oral (03/17 0419) ?BP: 104/75 (03/17 1228) ?Pulse Rate: 88 (03/17 1228) ? ?Labs: ?Recent Labs  ?   ?0000 03/13/22 ?1527 03/13/22 ?1530 03/13/22 ?1828 03/13/22 ?1828 03/14/22 ?0110 03/14/22 ?0503 03/14/22 ?0800 03/14/22 ?1649 03/15/22 ?8144 03/16/22 ?8185 03/16/22 ?1255  ?HGB   < >  --  14.6  --   --   --  14.8  --   --  13.8 13.8  --   ?HCT   < >  --  44.7  --   --   --  45.5  --   --  41.6 42.0  --   ?PLT   < >  --  356  --   --   --  383  --   --  365 332  --   ?APTT  --  31  --   --   --  127*  --  99*  --   --   --   --   ?LABPROT  --   --  15.7*  --   --   --   --   --   --   --  20.9*  --   ?INR  --   --  1.3*  --   --   --   --   --   --   --  1.8*  --   ?HEPARINUNFRC  --   --   --  <0.10*   < > 0.67  --   --    < > 0.51 0.14* 0.38  ?CREATININE   < >  --  1.21  --   --   --  1.05  --   --  1.22 1.41*  --   ?TROPONINIHS  --   --  22* 24*  --   --   --   --   --   --   --   --   ? < > = values in this interval not displayed.  ? ? ? ?Estimated Creatinine Clearance: 118.7 mL/min (A) (by C-G formula based on SCr of 1.41 mg/dL (H)). ? ? ?Medical History: ?Past Medical History:  ?Diagnosis Date  ? 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 a. 05/2021 noted during periods of sleep @ time of hospitalization for PE.  ? Cardiomyopathy (HCC)   ? a. 05/2021 Echo: EF <20%, glob HK. [place]:C ratio of 2.1:1 suggesting non-compaction. Gr2 DD. Sev reduced RV fxn. Nl PASP. Mildly dil RA. Mild MR; b. 10/2021 Echo: EF <20%, GrII DD; c. 10/2021 TEE: EF 20-25%, glob HK, sev red RV fxn.  ? Diabetes mellitus without complication (HCC)   ? HFrEF (heart failure with reduced ejection fraction) (HCC)    ? a. 05/2021 Echo: EF <20%. ? non-compaction.  ? Hypertension   ? Pulmonary embolism (HCC)   ? a. 05/2021 CTA Chest: PE extending from lobar arteris of RUL and RLL w/ concern for pulml infarct-->s/p thrombolysis and thrombectomy of RUL/RML/RLL.  ? Stroke [facility])   ? a. 10/2021 CT Head: Bilat cerebral infarcts w/ mod-sided acute to subacute R temporoparietal infarct; b. 10/2021 MRI: R post PCA territory infarcts w/o  hemorrhage; c. 10/2021 TEE: Neg bubble study. No LA/LAA thrombus. EF 20-25%, glob HK.  ? ? ?Medications:  ?Medications Prior to Admission  ?Medication Sig Dispense Refill Last Dose  ? apixaban (ELIQUIS) 5 MG TABS tablet Take 5 mg by mouth 2 (two) times daily.   Past Week  ? atorvastatin (LIPITOR) 80 MG tablet Take 1 tablet (80 mg total) by mouth daily. 90 tablet 3 Past Week  ? carvedilol (COREG) 6.25 MG tablet Take 1 tablet (6.25 mg total) by mouth 2 (two) times daily with a meal. 180 tablet 3 Past Week  ? FARXIGA 10 MG TABS tablet Take 1 tablet (10 mg total) by mouth daily. 90 tablet 3 Past Week  ? ivabradine (CORLANOR) 5 MG TABS tablet Take 15mg  (3 tablets) TWO hours prior to cardiac CT scan in addition to 100mg  of metoprolol tartrate. 3 tablet 0   ? metFORMIN (GLUCOPHAGE) 500 MG tablet Take 1 tablet (500 mg total) by mouth 2 (two) times daily. 60 tablet 3 Past Week  ? Multiple Vitamin (ONE-A-DAY MENS PO) Take 1 tablet by mouth daily.   Past Week  ? sacubitril-valsartan (ENTRESTO) 24-26 MG Take 1 tablet by mouth 2 (two) times daily. 180 tablet 3 Past Week  ? spironolactone (ALDACTONE) 25 MG tablet Take 0.5 tablets (12.5 mg total) by mouth daily. 45 tablet 3 Past Week  ? Torsemide 40 MG TABS Take 40 mg by mouth 2 (two) times daily. PLEASE SCHEDULE OFFICE VISIT FOR FURTHER REFILLS. THANK YOU! 60 tablet 0 Past Week  ? Biotin 1000 MCG tablet Take 1 tablet (1 mg total) by mouth daily. Chew 1 gummy daily. (Patient not taking: Reported on 03/13/2022) 30 tablet 3 Not Taking  ? metoprolol tartrate (LOPRESSOR) 100 MG  tablet Take 1 tablet (100mg ) TWO hours prior to cardiac CT scan. 1 tablet 0   ? metoprolol tartrate (LOPRESSOR) 50 MG tablet Take 1 tablet (50 mg total) by mouth once for 1 dose. Take 2 hours prior to your CT. 1 tablet 0   ? ?Scheduled:  ? atorvastatin  80 mg Oral Daily  ? carvedilol  3.125 mg Oral BID WC  ? furosemide  40 mg Intravenous Daily  ? insulin aspart  0-20 Units Subcutaneous TID WC  ? insulin aspart  0-5 Units Subcutaneous QHS  ? melatonin  5 mg Oral QHS  ? ?Infusions:  ? heparin 2,300 Units/hr (03/16/22 03/15/2022)  ? ?PRN: acetaminophen **OR** acetaminophen, ondansetron **OR** ondansetron (ZOFRAN) IV ?Anti-infectives (From admission, onward)  ? ? None  ? ?  ? ? ?Assessment: ?26 y.o. male, history of type 2 diabetes, hypertension, HFrEF, PE, obesity, CVA, presents to the emergency department for evaluation of emesis and palpitations x1 month. Found to have new pulmonary embolism in the segmental left upper lobe pulmonary artery, as well as a right atrial thrombus and possible. Pharmacy consulted for management of heparin drip in the setting of acute pulmonary embolism/apical mural thrombus. Pt was initiated on warfarin, but stopped 3/17 due an LFT elevation.  ? ?3/17 12:55 HL 0.38  ? ?Goal of Therapy:  ?INR 2-3 ?Heparin level 0.3-0.7 units/ml ?Monitor platelets by anticoagulation protocol: Yes ?  ?Plan:  ?Heparin level is therapeutic. Will continue heparin infusion at 2300 units/hr. Recheck heparin level in 6 hours. CBC daily while on heparin. Plan to transfer to [facility].  ? ?4/17, PharmD ?03/16/2022 ?1:31 PM ?

## 2022-03-16 NOTE — TOC CM/SW Note (Signed)
?  Transition of Care (TOC) Screening Note ? ? ?Patient Details  ?Name: Robert Mccann ?Date of Birth: 12/17/1996 ? ? ?Transition of Care (TOC) CM/SW Contact:    ?Margarito Liner, LCSW ?Phone Number: ?03/16/2022, 9:46 AM ? ? ? ?Transition of Care Department [facility]) has reviewed patient and no TOC needs have been identified at this time. We will continue to monitor patient advancement through interdisciplinary progression rounds. If new patient transition needs arise, please place a TOC consult. ? ? ?

## 2022-03-16 NOTE — Discharge Instructions (Signed)
Patient is being discharged to [facility] for left and right heart cath tomorrow. ?Patient remains on heparin for new PE. ?Dr. Haroldine Laws is accepting physician at [facility]. ?

## 2022-03-16 NOTE — Progress Notes (Signed)
This nurse secure chatted Tonya RN and notified that PICC line will not be placed tonight. She VU. Tomasita Morrow, RN VAST ?

## 2022-03-16 NOTE — TOC Benefit Eligibility Note (Signed)
Patient Advocate Encounter ? ?Insurance verification completed.   ? ?The patient is currently admitted and upon discharge could be taking Eliquis & Xarelto. ? ?The current 30 day co-pay for each is: $15. ? ?The patient is insured through [employer].  ? ? ?Mary[place] Pink, CPhT ?Pharmacy Patient Advocate Specialist ?[facility] Pharmacy Patient Advocate Team ?Direct Number: [phone]  ?Fax: [phone]  ?

## 2022-03-17 DIAGNOSIS — I5023 Acute on chronic systolic (congestive) heart failure: Secondary | ICD-10-CM

## 2022-03-17 LAB — CBC WITH DIFFERENTIAL/PLATELET
Abs Immature Granulocytes: 0.02 10*3/uL (ref 0.00–0.07)
Basophils Absolute: 0 10*3/uL (ref 0.0–0.1)
Basophils Relative: 0 %
Eosinophils Absolute: 0 10*3/uL (ref 0.0–0.5)
Eosinophils Relative: 0 %
HCT: 41.7 % (ref 39.0–52.0)
Hemoglobin: 14.3 g/dL (ref 13.0–17.0)
Immature Granulocytes: 0 %
Lymphocytes Relative: 29 %
Lymphs Abs: 2.4 10*3/uL (ref 0.7–4.0)
MCH: 29.4 pg (ref 26.0–34.0)
MCHC: 34.3 g/dL (ref 30.0–36.0)
MCV: 85.8 fL (ref 80.0–100.0)
Monocytes Absolute: 1 10*3/uL (ref 0.1–1.0)
Monocytes Relative: 12 %
Neutro Abs: 4.7 10*3/uL (ref 1.7–7.7)
Neutrophils Relative %: 59 %
Platelets: 329 10*3/uL (ref 150–400)
RBC: 4.86 MIL/uL (ref 4.22–5.81)
RDW: 12.5 % (ref 11.5–15.5)
WBC: 8.1 10*3/uL (ref 4.0–10.5)
nRBC: 0 % (ref 0.0–0.2)

## 2022-03-17 LAB — COOXEMETRY PANEL
Carboxyhemoglobin: 1.7 % — ABNORMAL HIGH (ref 0.5–1.5)
Methemoglobin: <0.7 % (ref 0.0–1.5)
O2 Saturation: 71.2 %
Total hemoglobin: 14.4 g/dL (ref 12.0–16.0)

## 2022-03-17 LAB — COMPREHENSIVE METABOLIC PANEL
ALT: 1663 U/L — ABNORMAL HIGH (ref 0–44)
AST: 3128 U/L — ABNORMAL HIGH (ref 15–41)
Albumin: 2.7 g/dL — ABNORMAL LOW (ref 3.5–5.0)
Alkaline Phosphatase: 136 U/L — ABNORMAL HIGH (ref 38–126)
Anion gap: 10 (ref 5–15)
BUN: 15 mg/dL (ref 6–20)
CO2: 28 mmol/L (ref 22–32)
Calcium: 8.4 mg/dL — ABNORMAL LOW (ref 8.9–10.3)
Chloride: 95 mmol/L — ABNORMAL LOW (ref 98–111)
Creatinine, Ser: 1.44 mg/dL — ABNORMAL HIGH (ref 0.61–1.24)
GFR, Estimated: >60 mL/min (ref >60)
Glucose, Bld: 172 mg/dL — ABNORMAL HIGH (ref 70–99)
Potassium: 3.2 mmol/L — ABNORMAL LOW (ref 3.5–5.1)
Sodium: 133 mmol/L — ABNORMAL LOW (ref 135–145)
Total Bilirubin: 1.6 mg/dL — ABNORMAL HIGH (ref 0.3–1.2)
Total Protein: 6.4 g/dL — ABNORMAL LOW (ref 6.5–8.1)

## 2022-03-17 LAB — MAGNESIUM: Magnesium: 1.7 mg/dL (ref 1.7–2.4)

## 2022-03-17 LAB — TSH: TSH: 5.035 u[IU]/mL — ABNORMAL HIGH (ref 0.350–4.500)

## 2022-03-17 LAB — HEPARIN LEVEL (UNFRACTIONATED): Heparin Unfractionated: 0.41 IU/mL (ref 0.30–0.70)

## 2022-03-17 LAB — PROTIME-INR
INR: 1.9 — ABNORMAL HIGH (ref 0.8–1.2)
Prothrombin Time: 21.5 seconds — ABNORMAL HIGH (ref 11.4–15.2)

## 2022-03-17 LAB — GLUCOSE, CAPILLARY
Glucose-Capillary: 146 mg/dL — ABNORMAL HIGH (ref 70–99)
Glucose-Capillary: 224 mg/dL — ABNORMAL HIGH (ref 70–99)
Glucose-Capillary: 187 mg/dL — ABNORMAL HIGH (ref 70–99)
Glucose-Capillary: 219 mg/dL — ABNORMAL HIGH (ref 70–99)

## 2022-03-17 MED ORDER — MAGNESIUM SULFATE 2 GM/50ML IV SOLN
2.0000 g | Freq: Once | INTRAVENOUS | Status: AC
  Administered 2022-03-17: 2 g via INTRAVENOUS
  Filled 2022-03-17: qty 50

## 2022-03-17 MED ORDER — CHLORHEXIDINE GLUCONATE CLOTH 2 % EX PADS
6.0000 | MEDICATED_PAD | Freq: Every day | CUTANEOUS | Status: DC
  Administered 2022-03-17 – 2022-03-21 (×5): 6 via TOPICAL

## 2022-03-17 MED ORDER — SODIUM CHLORIDE 0.9% FLUSH: 10.0000 mL | INTRAVENOUS | Status: DC | PRN

## 2022-03-17 MED ORDER — DAPAGLIFLOZIN PROPANEDIOL 10 MG PO TABS
10.0000 mg | ORAL_TABLET | Freq: Every day | ORAL | Status: DC
  Administered 2022-03-18 – 2022-03-21 (×4): 10 mg via ORAL
  Filled 2022-03-17 (×4): qty 1

## 2022-03-17 MED ORDER — POTASSIUM CHLORIDE CRYS ER 20 MEQ PO TBCR
40.0000 meq | EXTENDED_RELEASE_TABLET | Freq: Once | ORAL | Status: AC
  Administered 2022-03-17: 40 meq via ORAL
  Filled 2022-03-17: qty 2

## 2022-03-17 MED ORDER — SODIUM CHLORIDE 0.9% FLUSH
10.0000 mL | Freq: Two times a day (BID) | INTRAVENOUS | Status: DC
  Administered 2022-03-20 – 2022-03-21 (×3): 10 mL

## 2022-03-17 MED ORDER — SPIRONOLACTONE 12.5 MG HALF TABLET
12.5000 mg | ORAL_TABLET | Freq: Every day | ORAL | Status: DC
  Administered 2022-03-17 – 2022-03-18 (×2): 12.5 mg via ORAL
  Filled 2022-03-17 (×2): qty 1

## 2022-03-17 MED ORDER — POTASSIUM CHLORIDE CRYS ER 20 MEQ PO TBCR
40.0000 meq | EXTENDED_RELEASE_TABLET | ORAL | Status: AC
  Administered 2022-03-17: 40 meq via ORAL
  Filled 2022-03-17: qty 2

## 2022-03-17 NOTE — Progress Notes (Signed)
Peripherally Inserted Central Catheter Placement ? ?The IV Nurse has discussed with the patient and/or persons authorized to consent for the patient, the purpose of this procedure and the potential benefits and risks involved with this procedure.  The benefits include less needle sticks, lab draws from the catheter, and the patient may be discharged home with the catheter. Risks include, but not limited to, infection, bleeding, blood clot (thrombus formation), and puncture of an artery; nerve damage and irregular heartbeat and possibility to perform a PICC exchange if needed/ordered by physician.  Alternatives to this procedure were also discussed.  Bard Power PICC patient education guide, fact sheet on infection prevention and patient information card has been provided to patient /or left at bedside.  Chari Manning, RN inserted PICC. ? ?PICC Placement Documentation  ?PICC Double Lumen 03/17/22 Right Cephalic 40 cm 0 cm (Active)  ?Indication for Insertion or Continuance of Line Chronic illness with exacerbations (CF, Sickle Cell, etc.) 03/17/22 0900  ?Exposed Catheter (cm) 0 cm 03/17/22 0900  ?Site Assessment Clean, Dry, Intact 03/17/22 0900  ?Lumen #1 Status Flushed;Saline locked;Blood return noted 03/17/22 0900  ?Lumen #2 Status Flushed;Saline locked;Blood return noted 03/17/22 0900  ?Dressing Type Securing device;Transparent 03/17/22 0900  ?Dressing Status Antimicrobial disc in place 03/17/22 0900  ?Safety Lock Not Applicable 03/17/22 0900  ?Line Care Connections checked and tightened 03/17/22 0900  ?Line Adjustment (NICU/IV Team Only) No 03/17/22 0900  ?Dressing Intervention New dressing 03/17/22 0900  ?Dressing Change Due 03/24/22 03/17/22 0900  ? ? ? ? ? ?Vernona Rieger  Rhiannan Kievit ?03/17/2022, 9:56 AM ? ?

## 2022-03-17 NOTE — Progress Notes (Signed)
ANTICOAGULATION CONSULT NOTE - Initial Consult ? ?Pharmacy Consult for heparin ?Indication:  bilateral PE and newly diagnosed apical mural thrombus ? ?No Known Allergies ? ?Patient Measurements: ?Height: 6' (182.9 cm) ?Weight: (!) 145.9 kg (321 lb 10.4 oz) ?IBW/kg (Calculated) : 77.6 ?Heparin Dosing Weight: 112.1 kg ? ?Vital Signs: ?Temp: 98.1 ?F (36.7 ?C) (03/18 0326) ?Temp Source: Oral (03/18 0326) ?BP: 122/83 (03/18 0326) ?Pulse Rate: 89 (03/18 0326) ? ?Labs: ?Recent Labs  ?  03/14/22 ?0800 03/14/22 ?1649 03/15/22 ?0653 03/16/22 ?[id number] 03/16/22 ?1255 03/16/22 ?2001 03/17/22 ?0127  ?HGB  --    < > 13.8 13.8  --   --  14.3  ?HCT  --   --  41.6 42.0  --   --  41.7  ?PLT  --   --  365 332  --   --  329  ?APTT 99*  --   --   --   --   --   --   ?LABPROT  --   --   --  20.9*  --   --  21.5*  ?INR  --   --   --  1.8*  --   --  1.9*  ?HEPARINUNFRC  --    < > 0.51 0.14* 0.38 0.27* 0.41  ?CREATININE  --   --  1.22 1.41*  --   --  1.44*  ? < > = values in this interval not displayed.  ? ? ? ?Estimated Creatinine Clearance: 114.3 mL/min (A) (by C-G formula based on SCr of 1.44 mg/dL (H)). ? ? ?Medical History: ?Past Medical History:  ?Diagnosis Date  ? 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 a. 05/2021 noted during periods of sleep @ time of hospitalization for PE.  ? Cardiomyopathy ([place])   ? a. 05/2021 Echo: EF <20%, glob HK. [place]:C ratio of 2.1:1 suggesting non-compaction. Gr2 DD. Sev reduced RV fxn. Nl PASP. Mildly dil RA. Mild MR; b. 10/2021 Echo: EF <20%, GrII DD; c. 10/2021 TEE: EF 20-25%, glob HK, sev red RV fxn.  ? Diabetes mellitus without complication ([place])   ? HFrEF (heart failure with reduced ejection fraction) ([place])   ? a. 05/2021 Echo: EF <20%. ? non-compaction.  ? Hypertension   ? Pulmonary embolism ([place])   ? a. 05/2021 CTA Chest: PE extending from lobar arteris of RUL and RLL w/ concern for pulml infarct-->s/p thrombolysis and thrombectomy of RUL/RML/RLL.  ? Stroke [facility])   ? a. 10/2021 CT Head: Bilat cerebral infarcts w/ mod-sided  acute to subacute R temporoparietal infarct; b. 10/2021 MRI: R post PCA territory infarcts w/o hemorrhage; c. 10/2021 TEE: Neg bubble study. No LA/LAA thrombus. EF 20-25%, glob HK.  ? ? ?Medications:  ?Medications Prior to Admission  ?Medication Sig Dispense Refill Last Dose  ? carvedilol (COREG) 3.125 MG tablet Take 1 tablet (3.125 mg total) by mouth 2 (two) times daily with a meal. 60 tablet 1 03/16/2022 at 0800  ? furosemide (LASIX) 10 MG/ML injection Inject 4 mLs (40 mg total) into the vein daily. 4 mL 0 03/16/2022  ? heparin 25000 UT/250ML infusion Inject 2,300 Units/hr into the vein continuous. 250 mL 0 03/16/2022  ? ?Scheduled:  ? digoxin  0.125 mg Oral Daily  ? furosemide  80 mg Intravenous BID  ? insulin aspart  0-20 Units Subcutaneous TID WC  ? insulin aspart  0-5 Units Subcutaneous QHS  ? sodium chloride flush  3 mL Intravenous Q12H  ? ?Infusions:  ? sodium chloride    ?  heparin 2,450 Units/hr (03/17/22 0216)  ? milrinone 0.25 mcg/kg/min (03/17/22 0217)  ? ?PRN: sodium chloride, acetaminophen, melatonin, ondansetron (ZOFRAN) IV, sodium chloride flush ?Anti-infectives (From admission, onward)  ? ? None  ? ?  ? ? ?Assessment: ?26 y.o. male, history of type 2 diabetes, hypertension, HFrEF, PE, obesity, CVA, presents to the emergency department for evaluation of emesis and palpitations x1 month. Found to have new pulmonary embolism in the segmental left upper lobe pulmonary artery, as well as a right atrial thrombus and LV thrombus. Pharmacy consulted for management of heparin drip in the setting of acute pulmonary embolism. ?  ?Transferred to [facility] for advanced heart failure management.  ?Heparin level therapeutic at 0.41 on drip at 2450 uts/hr. CBC stable and wnl. INR 1.9 after warfarin 10 mg x1 on 3/16. No s/x of bleeding per RN.  ? ?Goal of Therapy:  ?INR 2-3 ?Heparin level 0.3-0.7 units/ml ?Monitor platelets by anticoagulation protocol: Yes ?  ?Plan: ?Continue Heparin drip 2450 uts/hr ?Daily heparin level  and CBC ?Monitor for s/s bleeding ? ?Cathrine Muster, PharmD ?PGY2 Cardiology Pharmacy Resident ?Phone: [phone] ?03/17/2022  7:10 AM ? ?Please check [email or web address] for unit-specific pharmacy phone numbers. ? ?

## 2022-03-17 NOTE — Progress Notes (Signed)
? ? ?Advanced Heart Failure Rounding Note ? ? ?Subjective:   ? ?Remains on milrinone 0.25. PICC pending. Diuresing with IV lasix. Out 3L. Weight down 7 pounds ? ?On heparin. No bleeding. ? ?Breathing better. Mo orthopnea or PND ? ? ?Objective:   ?Weight Range: ? ?Vital Signs:   ?Temp:  [97.6 ?F (36.4 ?C)-98.6 ?F (37 ?C)] 97.6 ?F (36.4 ?C) (03/18 0746) ?Pulse Rate:  [86-93] 86 (03/18 0746) ?Resp:  [16-20] 19 (03/18 0746) ?BP: (99-127)/(73-92) 127/79 (03/18 0746) ?SpO2:  [93 %-99 %] 95 % (03/18 0746) ?Weight:  [145.9 kg-149.1 kg] 145.9 kg (03/18 0615) ?Last BM Date : 03/16/22 ? ?Weight change: ?Filed Weights  ? 03/16/22 1720 03/17/22 0615  ?Weight: (!) 149.1 kg (!) 145.9 kg  ? ? ?Intake/Output:  ? ?Intake/Output Summary (Last 24 hours) at 03/17/2022 0818 ?Last data filed at 03/17/2022 0219 ?Gross per 24 hour  ?Intake 480 ml  ?Output 3025 ml  ?Net -2545 ml  ?  ? ?Physical Exam: ?General:  Obese male sitting up in bed  No resp difficulty ?HEENT: normal ?Neck: supple. JVP to jaw . Carotids 2+ bilat; no bruits. No lymphadenopathy or thryomegaly appreciated. ?Cor: PMI nondisplaced. Regular rate & rhythm. +s3 ?Lungs: clear ?Abdomen: obese soft, nontender, nondistended. No hepatosplenomegaly. No bruits or masses. Good bowel sounds. ?Extremities: no cyanosis, clubbing, rash, tr edema ?Neuro: alert & orientedx3, cranial nerves grossly intact. moves all 4 extremities w/o difficulty. Affect pleasant ? ?Telemetry: Sinus 80s Personally reviewed ? ? ?Labs: ?Basic Metabolic Panel: ?Recent Labs  ?Lab 03/13/22 ?1530 03/14/22 ?0503 03/15/22 ?4782 03/16/22 ?9562 03/17/22 ?0127  ?NA 130* 130* 132* 128* 133*  ?K 3.8 3.5 3.4* 3.9 3.2*  ?CL 96* 94* 96* 94* 95*  ?CO2 27 29 26 24 28   ?GLUCOSE 282* 187* 140* 198* 172*  ?BUN 15 13 14 18 15   ?CREATININE 1.21 1.05 1.22 1.41* 1.44*  ?CALCIUM 8.6* 8.5* 8.4* 8.4* 8.4*  ?MG  --   --  1.8  --  1.7  ?PHOS  --   --  4.5  --   --   ? ? ?Liver Function Tests: ?Recent Labs  ?Lab 03/13/22 ?1530  03/16/22 ?03/15/22 03/17/22 ?0127  ?AST 118* 3,970* 3,128*  ?ALT 129* 1,733* 1,663*  ?ALKPHOS 119 124 136*  ?BILITOT 1.1 2.1* 1.6*  ?PROT 7.3 6.4* 6.4*  ?ALBUMIN 2.9* 2.9* 2.7*  ? ?Recent Labs  ?Lab 03/13/22 ?1530  ?LIPASE 25  ? ?No results for input(s): AMMONIA in the last 168 hours. ? ?CBC: ?Recent Labs  ?Lab 03/13/22 ?1530 03/14/22 ?0503 03/15/22 ?03/16/22 03/16/22 ?6578 03/17/22 ?0127  ?WBC 7.4 8.1 8.0 12.3* 8.1  ?NEUTROABS  --   --   --   --  4.7  ?HGB 14.6 14.8 13.8 13.8 14.3  ?HCT 44.7 45.5 41.6 42.0 41.7  ?MCV 88.2 88.3 87.4 87.1 85.8  ?PLT 356 383 365 332 329  ? ? ?Cardiac Enzymes: ?No results for input(s): CKTOTAL, CKMB, CKMBINDEX, TROPONINI in the last 168 hours. ? ?BNP: ?BNP (last 3 results) ?Recent Labs  ?  06/27/21 ?1855 09/06/21 ?1157 03/13/22 ?1530  ?BNP 740.6* 367.6* 1,083.1*  ? ? ?ProBNP (last 3 results) ?No results for input(s): PROBNP in the last 8760 hours. ? ? ? ?Other results: ? ?Imaging: ?11/06/21 Abdomen Complete ? ?Result Date: 03/16/2022 ?CLINICAL DATA:  Abnormal LFTs EXAM: ABDOMEN ULTRASOUND COMPLETE COMPARISON:  None. FINDINGS: Gallbladder: Gallbladder wall thickening up to 0.9 cm. No calculi identified. No sonographic Murphy sign noted by sonographer. Common bile duct: Diameter: 0.2  cm Liver: No focal lesion identified. Increased parenchymal echogenicity. Portal vein is patent on color Doppler imaging with normal direction of blood flow towards the liver. IVC: No abnormality visualized. Pancreas: Visualized portion unremarkable. Spleen: Size and appearance within normal limits. Right Kidney: Length: 12.0 cm. Echogenicity within normal limits. No mass or hydronephrosis visualized. Left Kidney: Length: 11.9 cm. Echogenicity within normal limits. No mass or hydronephrosis visualized. Abdominal aorta: No aneurysm visualized. Other findings: None. IMPRESSION: 1. Gallbladder wall thickening up to 0.9 cm. No gallstones identified. No common bile duct dilatation. Negative sonographic Murphy sign. 2. Hepatic  steatosis. Electronically Signed   By: Jearld Lesch M.D.   On: 03/16/2022 09:38  ? ?[place] EKG SITE RITE ? ?Result Date: 03/16/2022 ?If [employer] not attached, placement could not be confirmed due to current cardiac rhythm.  ? ? ?Medications:   ? ? ?Scheduled Medications: ? digoxin  0.125 mg Oral Daily  ? furosemide  80 mg Intravenous BID  ? insulin aspart  0-20 Units Subcutaneous TID WC  ? insulin aspart  0-5 Units Subcutaneous QHS  ? potassium chloride  40 mEq Oral Q3H  ? sodium chloride flush  3 mL Intravenous Q12H  ? ? ?Infusions: ? sodium chloride    ? heparin 2,450 Units/hr (03/17/22 0216)  ? magnesium sulfate bolus IVPB 2 g (03/17/22 0813)  ? milrinone 0.25 mcg/kg/min (03/17/22 0217)  ? ? ?PRN Medications: ?sodium chloride, acetaminophen, melatonin, ondansetron (ZOFRAN) IV, sodium chloride flush ? ? ?Assessment/Progress:  ? ?1. 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 Continue IV lasix  ?- Off carvedilol ?- Continue digoxin ?- Restart spiro & Farxiga  ?- Deferring cath for now with RA & LV clots. Can reconsider as needed ?- Not candidate for advanced therapies currently with size, RV dysfunction and recent noncompliance with meds ? ?2. Large LV thrombus: ?-Continue heparin gtt ?- Was off Eliquis x 2 weeks prior to admit. 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 Consider cMRI prior to d/c ? ?3. History of PE s/p thrombectomy and thrombolysis with recurrent new PEs this admission: ?-He had been out of Eliquis for ~ 2 weeks ?-Heparin gtt for now [facility] plan as above ?  ?4. Elevated high sensitivity troponin: ?-Not consistent with ACS  ?-Likely supply demand ischemia in the setting of HF ?  ?5. AKI: ?- Likely ATN  ?- Continue hemodynamic support and diuresis  ?  ?6. Shock liver ?-  Hemodynamic support ?- LFTs improving ?  ?7. History of right posterior PCA territory infarcts (cardioembolic) ?- At high risk for recurrent events with large LV thrombus ?- continue heparin ?  ?8. Nocturnal Mobtiz 2 heart block: ?-Noted during prior admission ?-Outpatient sleep study has been recommended, though not yet completed  ?  ?9. HLD: ?-Hold atorvastatin with significant transaminitis ?  ?10. DM2: ?-SSI ?-Farxiga ? ?11. Morbid obesity ?- Body mass index is 43.62 kg/m?. ?- Consider GLP-1-RA to help with weight los ? ?12. Hypokalemia ?- supp ? ?13. Hyponatremia ?- restrict FW ? ? ?Length of Stay: 1 ? ? ?Deerica Waszak ?03/17/2022, 8:18 AM ? ?Advanced Heart Failure Team ?Pager [phone] (M-F; 7a - 4p)  ?Please contact [facility] for night-coverage after hours (4p -7a ) and weekends on [email or web address] ? ?

## 2022-03-17 NOTE — Social Work (Signed)
CSW acknowledges consult for SNF/HH. The patient will require PT/OT evaluations. TOC will assist with disposition planning once the evaluations have been completed.  °  °TOC will continue to follow.    °

## 2022-03-17 NOTE — Progress Notes (Addendum)
?PROGRESS NOTE ? ? ? ?Robert Mccann  ZS:[id number] DOB: 1996/09/05 DOA: 03/16/2022 ?PCP: Ellene Route  ?Narrative: 26 y.o. male chronically ill with chronic systolic CHF, EF [id number], CVA, PE s/p thrombectomy, DM2, HTN, and obesity , was admitted to [facility] 3/14 with dyspnea fatigue, chest discomfort and emesis, reported noncompliance with Eliquis X 2 weeks, CTA chest noted new PE. ?-Echo noted LV thrombus, concerns for noncompaction cardiomyopathy, EF<20%, global hypokinesis of LV, grade 2 DD, severe RV dysfunction ?-He was started on IV heparin, IV Lasix, care limited by soft BPs, subsequently transferred to [facility] for advanced heart failure team evaluation and management ? ? ?Subjective: feels a bit better today, breathing slowly improving, was able to eat breakfast today ? ? ?Assessment & Plan: ? ?Acute on chronic systolic, biventricular CHF ?Cardiogenic shock ?Low output state ?-Echo noted LV thrombus, concerns for noncompaction cardiomyopathy, EF<20%, global hypokinesis of LV, grade 2 DD, severe RV dysfunction ?-Started on milrinone yesterday ?-Continue IV Lasix, digoxin ?-Per heart failure team ?  ?LV thrombus: ?-History of noncompliance with Eliquis X 2 weeks PTA ?-Continue IV heparin for now, change to apixaban when LFTs better ?  ?Recurrent pulmonary embolism: ?-Due to noncompliance as noted above, continue IV heparin for now ? ?Mild AKI ?-Secondary to cardiorenal syndrome, now on milrinone, monitor ? ?Shock liver ?-Significantly abnormal LFTs, likely secondary to passive hepatic congestion and shock liver ?-LFTs were normal prior to that, had a ultrasound at [facility] yesterday which noted hepatic steatosis, no acute findings ?-Continue to trend ? ?Mildly elevated TSH ?-Free T4/T3 pending ?  ?Essential hypertension: ?-Stable, currently on milrinone, Coreg discontinued ?  ?Hyperlipidemia: ?Continue atorvastatin 80 mg daily ?  ?Type 2 diabetes ?-CBG stable, metformin, Farxiga on hold ? ?DVT prophylaxis: Heparin  GTT ?Code Status: Full code ? ?Procedures:  ? ?Antimicrobials:  ? ? ?Objective: ?Vitals:  ? 03/16/22 2337 03/17/22 0326 03/17/22 0615 03/17/22 0746  ?BP: (!) 121/92 122/83  127/79  ?Pulse: 93 89  86  ?Resp: 20 20  19   ?Temp: 97.6 ?F (36.4 ?C) 98.1 ?F (36.7 ?C)  97.6 ?F (36.4 ?C)  ?TempSrc: Oral Oral  Oral  ?SpO2: 95% 93%  95%  ?Weight:   (!) 145.9 kg   ?Height:      ? ? ?Intake/Output Summary (Last 24 hours) at 03/17/2022 1058 ?Last data filed at 03/17/2022 1008 ?Gross per 24 hour  ?Intake 480 ml  ?Output 3475 ml  ?Net -2995 ml  ? ?Filed Weights  ? 03/16/22 1720 03/17/22 0615  ?Weight: (!) 149.1 kg (!) 145.9 kg  ? ? ?Examination: ? ?Morbidly obese chronically ill male laying in bed, AAOx3 ?HEENT: Positive JVD ?CVS: S1-S2, S3 ?Lungs: Clear anteriorly, distant breath sounds, ?Abdomen: Soft, obese, nontender, bowel sounds present ?Extremities: Positive edema, PICC line in RUE ?Neuro: Moves all extremities, no localizing signs ? ?Data Reviewed:  ? ?CBC: ?Recent Labs  ?Lab 03/13/22 ?1530 03/14/22 ?0503 03/15/22 ?[id number] 03/16/22 ?[id number] 03/17/22 ?0127  ?WBC 7.4 8.1 8.0 12.3* 8.1  ?NEUTROABS  --   --   --   --  4.7  ?HGB 14.6 14.8 13.8 13.8 14.3  ?HCT 44.7 45.5 41.6 42.0 41.7  ?MCV 88.2 88.3 87.4 87.1 85.8  ?PLT 356 383 365 332 329  ? ?Basic Metabolic Panel: ?Recent Labs  ?Lab 03/13/22 ?1530 03/14/22 ?0503 03/15/22 ?[id number] 03/16/22 ?[id number] 03/17/22 ?0127  ?NA 130* 130* 132* 128* 133*  ?K 3.8 3.5 3.4* 3.9 3.2*  ?CL 96* 94* 96* 94* 95*  ?CO2 27 29 26 24 28   ?  GLUCOSE 282* 187* 140* 198* 172*  ?BUN 15 13 14 18 15   ?CREATININE 1.21 1.05 1.22 1.41* 1.44*  ?CALCIUM 8.6* 8.5* 8.4* 8.4* 8.4*  ?MG  --   --  1.8  --  1.7  ?PHOS  --   --  4.5  --   --   ? ?GFR: ?Estimated Creatinine Clearance: 114.3 mL/min (A) (by C-G formula based on SCr of 1.44 mg/dL (H)). ?Liver Function Tests: ?Recent Labs  ?Lab 03/13/22 ?1530 03/16/22 ?[id number] 03/17/22 ?0127  ?AST 118* 3,970* 3,128*  ?ALT 129* 1,733* 1,663*  ?ALKPHOS 119 124 136*  ?BILITOT 1.1 2.1* 1.6*   ?PROT 7.3 6.4* 6.4*  ?ALBUMIN 2.9* 2.9* 2.7*  ? ?Recent Labs  ?Lab 03/13/22 ?1530  ?LIPASE 25  ? ?No results for input(s): AMMONIA in the last 168 hours. ?Coagulation Profile: ?Recent Labs  ?Lab 03/13/22 ?1530 03/16/22 ?[id number] 03/17/22 ?0127  ?INR 1.3* 1.8* 1.9*  ? ?Cardiac Enzymes: ?No results for input(s): CKTOTAL, CKMB, CKMBINDEX, TROPONINI in the last 168 hours. ?BNP (last 3 results) ?No results for input(s): PROBNP in the last 8760 hours. ?HbA1C: ?No results for input(s): HGBA1C in the last 72 hours. ?CBG: ?Recent Labs  ?Lab 03/16/22 ?1005 03/16/22 ?1230 03/16/22 ?1804 03/16/22 ?2113 03/17/22 ?[id number]  ?GLUCAP 188* 274* 206* 206* 146*  ? ?Lipid Profile: ?No results for input(s): CHOL, HDL, LDLCALC, TRIG, CHOLHDL, LDLDIRECT in the last 72 hours. ?Thyroid Function Tests: ?Recent Labs  ?  03/17/22 ?0127  ?TSH 5.035*  ? ?Anemia Panel: ?No results for input(s): VITAMINB12, FOLATE, FERRITIN, TIBC, IRON, RETICCTPCT in the last 72 hours. ?Urine analysis: ?   ?Component Value Date/Time  ? COLORURINE YELLOW (A) 11/05/2021 2056  ? APPEARANCEUR CLEAR (A) 11/05/2021 2056  ? LABSPEC 1.030 11/05/2021 2056  ? PHURINE 5.0 11/05/2021 2056  ? GLUCOSEU >=500 (A) 11/05/2021 2056  ? [place] NEGATIVE 11/05/2021 2056  ? [place] NEGATIVE 11/05/2021 2056  ? [place] NEGATIVE 11/05/2021 2056  ? PROTEINUR 30 (A) 11/05/2021 2056  ? NITRITE NEGATIVE 11/05/2021 2056  ? LEUKOCYTESUR NEGATIVE 11/05/2021 2056  ? ?Sepsis Labs: ?@LABRCNTIP (procalcitonin:4,lacticidven:4) ? ?) ?Recent Results (from the past 240 hour(s))  ?Resp Panel by RT-PCR (Flu A&B, Covid) Nasopharyngeal Swab     Status: None  ? Collection Time: 03/14/22  7:59 AM  ? Specimen: Nasopharyngeal Swab; Nasopharyngeal(NP) swabs in vial transport medium  ?Result Value Ref Range Status  ? SARS Coronavirus 2 by RT PCR NEGATIVE NEGATIVE Final  ?  Comment: (NOTE) ?SARS-CoV-2 target nucleic acids are NOT DETECTED. ? ?The SARS-CoV-2 RNA is generally detectable in upper respiratory ?specimens  during the acute phase of infection. The lowest ?concentration of SARS-CoV-2 viral copies this assay can detect is ?138 copies/mL. A negative result does not preclude SARS-Cov-2 ?infection and should not be used as the sole basis for treatment or ?other patient management decisions. A negative result may occur with  ?improper specimen collection/handling, submission of specimen other ?than nasopharyngeal swab, presence of viral mutation(s) within the ?areas targeted by this assay, and inadequate number of viral ?copies(<138 copies/mL). A negative result must be combined with ?clinical observations, patient history, and epidemiological ?information. The expected result is Negative. ? ?Fact Sheet for Patients:  ?EntrepreneurPulse.com.au ? ?Fact Sheet for Healthcare Providers:  ?IncredibleEmployment.be ? ?This test is no t yet approved or cleared by the [place] FDA and  ?has been authorized for detection and/or diagnosis of SARS-CoV-2 by ?FDA under an Emergency Use Authorization (EUA). This EUA will remain  ?in effect (meaning this test can be  used) for the duration of the ?COVID-19 declaration under Section 564(b)(1) of the Act, 21 ?U.S.C.section 360bbb-3(b)(1), unless the authorization is terminated  ?or revoked sooner.  ? ? ?  ? Influenza A by PCR NEGATIVE NEGATIVE Final  ? Influenza B by PCR NEGATIVE NEGATIVE Final  ?  Comment: (NOTE) ?The Xpert Xpress SARS-CoV-2/FLU/RSV plus assay is intended as an aid ?in the diagnosis of influenza from Nasopharyngeal swab specimens and ?should not be used as a sole basis for treatment. Nasal washings and ?aspirates are unacceptable for Xpert Xpress SARS-CoV-2/FLU/RSV ?testing. ? ?Fact Sheet for Patients: ?EntrepreneurPulse.com.au ? ?Fact Sheet for Healthcare Providers: ?IncredibleEmployment.be ? ?This test is not yet approved or cleared by the [place] FDA and ?has been authorized for detection  and/or diagnosis of SARS-CoV-2 by ?FDA under an Emergency Use Authorization (EUA). This EUA will remain ?in effect (meaning this test can be used) for the duration of the ?COVID-19 declaration under Section 56

## 2022-03-18 LAB — CBC WITH DIFFERENTIAL/PLATELET
Abs Immature Granulocytes: 0.03 10*3/uL (ref 0.00–0.07)
Basophils Absolute: 0 10*3/uL (ref 0.0–0.1)
Basophils Relative: 1 %
Eosinophils Absolute: 0.1 10*3/uL (ref 0.0–0.5)
Eosinophils Relative: 2 %
HCT: 41.8 % (ref 39.0–52.0)
Hemoglobin: 14.4 g/dL (ref 13.0–17.0)
Immature Granulocytes: 0 %
Lymphocytes Relative: 27 %
Lymphs Abs: 2 10*3/uL (ref 0.7–4.0)
MCH: 29.6 pg (ref 26.0–34.0)
MCHC: 34.4 g/dL (ref 30.0–36.0)
MCV: 85.8 fL (ref 80.0–100.0)
Monocytes Absolute: 1.2 10*3/uL — ABNORMAL HIGH (ref 0.1–1.0)
Monocytes Relative: 16 %
Neutro Abs: 4.1 10*3/uL (ref 1.7–7.7)
Neutrophils Relative %: 54 %
Platelets: 366 10*3/uL (ref 150–400)
RBC: 4.87 MIL/uL (ref 4.22–5.81)
RDW: 12.4 % (ref 11.5–15.5)
WBC: 7.4 10*3/uL (ref 4.0–10.5)
nRBC: 0 % (ref 0.0–0.2)

## 2022-03-18 LAB — HEPARIN LEVEL (UNFRACTIONATED)
Heparin Unfractionated: 0.69 IU/mL (ref 0.30–0.70)
Heparin Unfractionated: 0.51 IU/mL (ref 0.30–0.70)

## 2022-03-18 LAB — GLUCOSE, CAPILLARY
Glucose-Capillary: 211 mg/dL — ABNORMAL HIGH (ref 70–99)
Glucose-Capillary: 206 mg/dL — ABNORMAL HIGH (ref 70–99)
Glucose-Capillary: 200 mg/dL — ABNORMAL HIGH (ref 70–99)
Glucose-Capillary: 285 mg/dL — ABNORMAL HIGH (ref 70–99)

## 2022-03-18 LAB — COMPREHENSIVE METABOLIC PANEL
ALT: 1154 U/L — ABNORMAL HIGH (ref 0–44)
AST: 1201 U/L — ABNORMAL HIGH (ref 15–41)
Albumin: 2.5 g/dL — ABNORMAL LOW (ref 3.5–5.0)
Alkaline Phosphatase: 150 U/L — ABNORMAL HIGH (ref 38–126)
Anion gap: 9 (ref 5–15)
BUN: 11 mg/dL (ref 6–20)
CO2: 31 mmol/L (ref 22–32)
Calcium: 8.1 mg/dL — ABNORMAL LOW (ref 8.9–10.3)
Chloride: 91 mmol/L — ABNORMAL LOW (ref 98–111)
Creatinine, Ser: 1.28 mg/dL — ABNORMAL HIGH (ref 0.61–1.24)
GFR, Estimated: >60 mL/min (ref >60)
Glucose, Bld: 259 mg/dL — ABNORMAL HIGH (ref 70–99)
Potassium: 3.5 mmol/L (ref 3.5–5.1)
Sodium: 131 mmol/L — ABNORMAL LOW (ref 135–145)
Total Bilirubin: 1.5 mg/dL — ABNORMAL HIGH (ref 0.3–1.2)
Total Protein: 6.5 g/dL (ref 6.5–8.1)

## 2022-03-18 LAB — COOXEMETRY PANEL
Carboxyhemoglobin: 1.1 % (ref 0.5–1.5)
Methemoglobin: 1 % (ref 0.0–1.5)
O2 Saturation: 70.4 %
Total hemoglobin: 12.6 g/dL (ref 12.0–16.0)

## 2022-03-18 LAB — MAGNESIUM: Magnesium: 1.6 mg/dL — ABNORMAL LOW (ref 1.7–2.4)

## 2022-03-18 LAB — T4, FREE: Free T4: 1.56 ng/dL — ABNORMAL HIGH (ref 0.61–1.12)

## 2022-03-18 MED ORDER — INSULIN ASPART 100 UNIT/ML IJ SOLN
2.0000 [IU] | Freq: Three times a day (TID) | INTRAMUSCULAR | Status: DC
Start: 2022-03-18 — End: 2022-03-19
  Administered 2022-03-18 (×2): 2 [IU] via SUBCUTANEOUS

## 2022-03-18 MED ORDER — SACUBITRIL-VALSARTAN 24-26 MG PO TABS
1.0000 | ORAL_TABLET | Freq: Two times a day (BID) | ORAL | Status: DC
  Administered 2022-03-18 – 2022-03-21 (×7): 1 via ORAL
  Filled 2022-03-18 (×7): qty 1

## 2022-03-18 MED ORDER — INSULIN GLARGINE-YFGN 100 UNIT/ML ~~LOC~~ SOLN
12.0000 [IU] | Freq: Every day | SUBCUTANEOUS | Status: DC
  Administered 2022-03-18: 12 [IU] via SUBCUTANEOUS
  Filled 2022-03-18 (×2): qty 0.12

## 2022-03-18 MED ORDER — POTASSIUM CHLORIDE CRYS ER 20 MEQ PO TBCR
40.0000 meq | EXTENDED_RELEASE_TABLET | Freq: Once | ORAL | Status: AC
Start: 2022-03-18 — End: 2022-03-18
  Administered 2022-03-18: 40 meq via ORAL
  Filled 2022-03-18: qty 2

## 2022-03-18 MED ORDER — MAGNESIUM SULFATE 4 GM/100ML IV SOLN
4.0000 g | Freq: Once | INTRAVENOUS | Status: AC
  Administered 2022-03-18: 4 g via INTRAVENOUS
  Filled 2022-03-18: qty 100

## 2022-03-18 NOTE — Progress Notes (Signed)
?PROGRESS NOTE ? ? ? ?Robert Mccann  [id number] DOB: [birth year] DOA: 03/16/2022 ?PCP: Ellene Route  ?Narrative: 26 y.o. male chronically ill with chronic systolic CHF, EF [id number], CVA, PE s/p thrombectomy, DM2, HTN, and obesity , was admitted to [facility] 3/14 with dyspnea fatigue, chest discomfort and emesis, reported noncompliance with Eliquis X 2 weeks, CTA chest noted new PE. ?-Echo noted LV thrombus, concerns for noncompaction cardiomyopathy, EF<20%, global hypokinesis of LV, grade 2 DD, severe RV dysfunction ?-He was started on IV heparin, IV Lasix, care limited by soft BPs, subsequently transferred to [facility] for advanced heart failure team evaluation and management ? ? ?Subjective: feels a bit better today, breathing slowly improving, was able to eat breakfast today ? ? ?Assessment & Plan: ? ?Acute on chronic systolic, biventricular CHF ?Cardiogenic shock ?Low output state ?-Echo noted LV thrombus, concerns for noncompaction cardiomyopathy, EF<20%, global hypokinesis of LV, grade 2 DD, severe RV dysfunction ?-Started on milrinone 3/17 ?-Continue IV Lasix, Aldactone, Farxiga digoxin ?-Diuresing well, 7.1 L negative ?-Per heart failure team ?  ?LV thrombus: ?-History of noncompliance with Eliquis X 2 weeks PTA ?-Continue IV heparin for now, change to apixaban when LFTs better ?-Likely not a good candidate for Coumadin therapy on account of poor compliance ?  ?Recurrent pulmonary embolism: ?-Due to noncompliance as noted above, continue IV heparin for now ? ?Mild AKI ?-Secondary to cardiorenal syndrome, now on milrinone, improving ? ?Shock liver ?-Significantly abnormal LFTs, likely secondary to shock liver and passive hepatic congestion ?-LFTs were normal prior to that, had a ultrasound at [facility] 3/17 which noted hepatic steatosis, no acute findings ?-Improving ? ?Mildly elevated TSH ?-Free T4 is mildly elevated ?-This is likely erroneous, cannot be correlated-on further discussion patient does report  multivitamin use in the past week, could be affecting T4 levels ?-Recommend repeat TSH/T4 in 6 to 8 weeks off multivitamins/biotin ?  ?Essential hypertension: ?-Stable, currently on milrinone, Coreg discontinued ?  ?Hyperlipidemia: ?Continue atorvastatin 80 mg daily ?  ?Type 2 diabetes ?-CBGs are elevated, will add low-dose Semglee, meal coverage, continue [place] ?-Metformin on hold ? ?DVT prophylaxis: Heparin GTT ?Code Status: Full code ? ?Procedures:  ? ?Antimicrobials:  ? ? ?Objective: ?Vitals:  ? 03/17/22 1939 03/17/22 2352 03/18/22 0243 03/18/22 0757  ?BP: 115/73 116/70 116/70 129/84  ?Pulse: 90 95 98 99  ?Resp: 20 20 20 17   ?Temp: 98 ?F (36.7 ?C) 98 ?F (36.7 ?C) 98 ?F (36.7 ?C) 97.6 ?F (36.4 ?C)  ?TempSrc: Axillary Oral Oral Oral  ?SpO2: 97% 95% 99% 96%  ?Weight:   (!) 142.2 kg   ?Height:      ? ? ?Intake/Output Summary (Last 24 hours) at 03/18/2022 1056 ?Last data filed at 03/18/2022 1045 ?Gross per 24 hour  ?Intake 2416.47 ml  ?Output 9050 ml  ?Net -6633.53 ml  ? ?Filed Weights  ? 03/16/22 1720 03/17/22 0615 03/18/22 0243  ?Weight: (!) 149.1 kg (!) 145.9 kg (!) 142.2 kg  ? ? ?Examination: ? ?Morbidly obese pleasant male sitting up in the recliner, AAOx3, no distress ?HEENT: Positive JVD ?CVS: S1-S2, regular rate rhythm ?Lungs: Distant breath sounds otherwise clear ?Abdomen: Soft, obese, nontender, bowel sounds present ?Extremities: Trace edema, PICC line in RUE ?Neuro: Moves all extremities, no localizing signs ? ?Data Reviewed:  ? ?CBC: ?Recent Labs  ?Lab 03/14/22 ?0503 03/15/22 ?[id number] 03/16/22 ?[id number] 03/17/22 ?0127 03/18/22 ?[id number]  ?WBC 8.1 8.0 12.3* 8.1 7.4  ?NEUTROABS  --   --   --  4.7 4.1  ?HGB 14.8 13.8  13.8 14.3 14.4  ?HCT 45.5 41.6 42.0 41.7 41.8  ?MCV 88.3 87.4 87.1 85.8 85.8  ?PLT 383 365 332 329 366  ? ?Basic Metabolic Panel: ?Recent Labs  ?Lab 03/14/22 ?0503 03/15/22 ?[id number] 03/16/22 ?[id number] 03/17/22 ?0127 03/18/22 ?[id number]  ?NA 130* 132* 128* 133* 131*  ?K 3.5 3.4* 3.9 3.2* 3.5  ?CL 94* 96* 94* 95* 91*   ?CO2 29 26 24 28 31   ?GLUCOSE 187* 140* 198* 172* 259*  ?BUN 13 14 18 15 11   ?CREATININE 1.05 1.22 1.41* 1.44* 1.28*  ?CALCIUM 8.5* 8.4* 8.4* 8.4* 8.1*  ?MG  --  1.8  --  1.7 1.6*  ?PHOS  --  4.5  --   --   --   ? ?GFR: ?Estimated Creatinine Clearance: 126.8 mL/min (A) (by C-G formula based on SCr of 1.28 mg/dL (H)). ?Liver Function Tests: ?Recent Labs  ?Lab 03/13/22 ?1530 03/16/22 ?[id number] 03/17/22 ?0127 03/18/22 ?[id number]  ?AST 118* 3,970* 3,128* 1,201*  ?ALT 129* 1,733* 1,663* 1,154*  ?ALKPHOS 119 124 136* 150*  ?BILITOT 1.1 2.1* 1.6* 1.5*  ?PROT 7.3 6.4* 6.4* 6.5  ?ALBUMIN 2.9* 2.9* 2.7* 2.5*  ? ?Recent Labs  ?Lab 03/13/22 ?1530  ?LIPASE 25  ? ?No results for input(s): AMMONIA in the last 168 hours. ?Coagulation Profile: ?Recent Labs  ?Lab 03/13/22 ?1530 03/16/22 ?[id number] 03/17/22 ?0127  ?INR 1.3* 1.8* 1.9*  ? ?Cardiac Enzymes: ?No results for input(s): CKTOTAL, CKMB, CKMBINDEX, TROPONINI in the last 168 hours. ?BNP (last 3 results) ?No results for input(s): PROBNP in the last 8760 hours. ?HbA1C: ?No results for input(s): HGBA1C in the last 72 hours. ?CBG: ?Recent Labs  ?Lab 03/17/22 ?0607 03/17/22 ?1206 03/17/22 ?1606 03/17/22 ?2122 03/18/22 ?[id number]  ?GLUCAP 146* 224* 187* 219* 211*  ? ?Lipid Profile: ?No results for input(s): CHOL, HDL, LDLCALC, TRIG, CHOLHDL, LDLDIRECT in the last 72 hours. ?Thyroid Function Tests: ?Recent Labs  ?  03/17/22 ?0127 03/18/22 ?[id number]  ?TSH 5.035*  --   ?FREET4  --  1.56*  ? ?Anemia Panel: ?No results for input(s): VITAMINB12, FOLATE, FERRITIN, TIBC, IRON, RETICCTPCT in the last 72 hours. ?Urine analysis: ?   ?Component Value Date/Time  ? COLORURINE YELLOW (A) 11/05/2021 2056  ? APPEARANCEUR CLEAR (A) 11/05/2021 2056  ? LABSPEC 1.030 11/05/2021 2056  ? PHURINE 5.0 11/05/2021 2056  ? GLUCOSEU >=500 (A) 11/05/2021 2056  ? [place] NEGATIVE 11/05/2021 2056  ? [place] NEGATIVE 11/05/2021 2056  ? [place] NEGATIVE 11/05/2021 2056  ? PROTEINUR 30 (A) 11/05/2021 2056  ? NITRITE NEGATIVE 11/05/2021  2056  ? LEUKOCYTESUR NEGATIVE 11/05/2021 2056  ? ?Sepsis Labs: ?@LABRCNTIP (procalcitonin:4,lacticidven:4) ? ?) ?Recent Results (from the past 240 hour(s))  ?Resp Panel by RT-PCR (Flu A&B, Covid) Nasopharyngeal Swab     Status: None  ? Collection Time: 03/14/22  7:59 AM  ? Specimen: Nasopharyngeal Swab; Nasopharyngeal(NP) swabs in vial transport medium  ?Result Value Ref Range Status  ? SARS Coronavirus 2 by RT PCR NEGATIVE NEGATIVE Final  ?  Comment: (NOTE) ?SARS-CoV-2 target nucleic acids are NOT DETECTED. ? ?The SARS-CoV-2 RNA is generally detectable in upper respiratory ?specimens during the acute phase of infection. The lowest ?concentration of SARS-CoV-2 viral copies this assay can detect is ?138 copies/mL. A negative result does not preclude SARS-Cov-2 ?infection and should not be used as the sole basis for treatment or ?other patient management decisions. A negative result may occur with  ?improper specimen collection/handling, submission of specimen other ?than nasopharyngeal swab, presence of viral mutation(s) within the ?areas targeted by  this assay, and inadequate number of viral ?copies(<138 copies/mL). A negative result must be combined with ?clinical observations, patient history, and epidemiological ?information. The expected result is Negative. ? ?Fact Sheet for Patients:  ?EntrepreneurPulse.com.au ? ?Fact Sheet for Healthcare Providers:  ?IncredibleEmployment.be ? ?This test is no t yet approved or cleared by the [place] FDA and  ?has been authorized for detection and/or diagnosis of SARS-CoV-2 by ?FDA under an Emergency Use Authorization (EUA). This EUA will remain  ?in effect (meaning this test can be used) for the duration of the ?COVID-19 declaration under Section 564(b)(1) of the Act, 21 ?U.S.C.section 360bbb-3(b)(1), unless the authorization is terminated  ?or revoked sooner.  ? ? ?  ? Influenza A by PCR NEGATIVE NEGATIVE Final  ? Influenza B by PCR  NEGATIVE NEGATIVE Final  ?  Comment: (NOTE) ?The Xpert Xpress SARS-CoV-2/FLU/RSV plus assay is intended as an aid ?in the diagnosis of influenza from Nasopharyngeal swab specimens and ?should not be used as a sole

## 2022-03-18 NOTE — Progress Notes (Signed)
ANTICOAGULATION CONSULT NOTE ? ?Pharmacy Consult for heparin ?Indication:  bilateral PE and newly diagnosed apical mural thrombus ? ?No Known Allergies ? ?Patient Measurements: ?Height: 6' (182.9 cm) ?Weight: (!) 142.2 kg (313 lb 7.9 oz) ?IBW/kg (Calculated) : 77.6 ?Heparin Dosing Weight: 112.1 kg ? ?Vital Signs: ?Temp: 98.2 ?F (36.8 ?C) (03/19 1154) ?Temp Source: Oral (03/19 1154) ?BP: 129/90 (03/19 1154) ?Pulse Rate: 105 (03/19 1154) ? ?Labs: ?Recent Labs  ?  03/16/22 ?4496 03/16/22 ?1255 03/17/22 ?0127 03/18/22 ?7591 03/18/22 ?1400  ?HGB 13.8  --  14.3 14.4  --   ?HCT 42.0  --  41.7 41.8  --   ?PLT 332  --  329 366  --   ?LABPROT 20.9*  --  21.5*  --   --   ?INR 1.8*  --  1.9*  --   --   ?HEPARINUNFRC 0.14*   < > 0.41 0.69 0.51  ?CREATININE 1.41*  --  1.44* 1.28*  --   ? < > = values in this interval not displayed.  ? ? ? ?Estimated Creatinine Clearance: 126.8 mL/min (A) (by C-G formula based on SCr of 1.28 mg/dL (H)). ? ? ?Medical History: ?Past Medical History:  ?Diagnosis Date  ? 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 a. 05/2021 noted during periods of sleep @ time of hospitalization for PE.  ? Cardiomyopathy (HCC)   ? a. 05/2021 Echo: EF <20%, glob HK. [place]:C ratio of 2.1:1 suggesting non-compaction. Gr2 DD. Sev reduced RV fxn. Nl PASP. Mildly dil RA. Mild MR; b. 10/2021 Echo: EF <20%, GrII DD; c. 10/2021 TEE: EF 20-25%, glob HK, sev red RV fxn.  ? Diabetes mellitus without complication (HCC)   ? HFrEF (heart failure with reduced ejection fraction) (HCC)   ? a. 05/2021 Echo: EF <20%. ? non-compaction.  ? Hypertension   ? Pulmonary embolism (HCC)   ? a. 05/2021 CTA Chest: PE extending from lobar arteris of RUL and RLL w/ concern for pulml infarct-->s/p thrombolysis and thrombectomy of RUL/RML/RLL.  ? Stroke [facility])   ? a. 10/2021 CT Head: Bilat cerebral infarcts w/ mod-sided acute to subacute R temporoparietal infarct; b. 10/2021 MRI: R post PCA territory infarcts w/o hemorrhage; c. 10/2021 TEE: Neg bubble study. No LA/LAA thrombus.  EF 20-25%, glob HK.  ? ? ?Medications:  ?Medications Prior to Admission  ?Medication Sig Dispense Refill Last Dose  ? carvedilol (COREG) 3.125 MG tablet Take 1 tablet (3.125 mg total) by mouth 2 (two) times daily with a meal. 60 tablet 1 03/16/2022 at 0800  ? furosemide (LASIX) 10 MG/ML injection Inject 4 mLs (40 mg total) into the vein daily. 4 mL 0 03/16/2022  ? heparin [id number] UT/250ML infusion Inject 2,300 Units/hr into the vein continuous. 250 mL 0 03/16/2022  ? ?Scheduled:  ? Chlorhexidine Gluconate Cloth  6 each Topical Daily  ? dapagliflozin propanediol  10 mg Oral Daily  ? digoxin  0.125 mg Oral Daily  ? insulin aspart  0-20 Units Subcutaneous TID WC  ? insulin aspart  0-5 Units Subcutaneous QHS  ? insulin aspart  2 Units Subcutaneous TID WC  ? insulin glargine-yfgn  12 Units Subcutaneous Daily  ? sacubitril-valsartan  1 tablet Oral BID  ? sodium chloride flush  10-40 mL Intracatheter Q12H  ? sodium chloride flush  3 mL Intravenous Q12H  ? spironolactone  12.5 mg Oral Daily  ? ?Infusions:  ? sodium chloride    ? heparin 2,400 Units/hr (03/18/22 1528)  ? magnesium sulfate bolus IVPB    ?  milrinone 0.25 mcg/kg/min (03/18/22 1221)  ? ?PRN: sodium chloride, acetaminophen, melatonin, ondansetron (ZOFRAN) IV, sodium chloride flush, sodium chloride flush ?Anti-infectives (From admission, onward)  ? ? None  ? ?  ? ? ?Assessment: ?26 y.o. male, history of type 2 diabetes, hypertension, HFrEF, PE, obesity, CVA, presents to the emergency department for evaluation of emesis and palpitations x1 month. Found to have new pulmonary embolism in the segmental left upper lobe pulmonary artery, as well as a right atrial thrombus and LV thrombus. Pharmacy consulted for management of heparin drip in the setting of acute pulmonary embolism. ?  ?Heparin level came back therapeutic at 0.51, on 2400 units/hr. No s/sx of bleeding or infusion issues.  ? ?Goal of Therapy:  ?INR 2-3 ?Heparin level 0.3-0.7 units/ml ?Monitor platelets by  anticoagulation protocol: Yes ?  ?Plan: ?Continue heparin drip at 2400 uts/hr ?Daily heparin level and CBC ?Monitor for s/s bleeding ? ?Sherron Monday, PharmD, BCCCP ?Clinical Pharmacist  ?Phone: [phone] ?03/18/2022 4:07 PM ? ?Please check AMION for all [facility] Pharmacy phone numbers ?After 10:00 PM, call Main Pharmacy [phone] ? ? ?

## 2022-03-18 NOTE — Progress Notes (Signed)
? ? ?Advanced Heart Failure Rounding Note ? ? ?Subjective:   ? ?Remains on milrinone 0.25 and IV lasix. ? ?Co-ox 70%. Diuresing well. Another 4.5L out. Weight down another 8 pounds. (15 pounds in 2 days).  Renal function stable.  CVP 4 ? ?On heparin for LV clot and PE. No bleeding ? ?Breathing better. No orthopnea or PND. CP resolved.  ? ? ?Objective:   ?Weight Range: ? ?Vital Signs:   ?Temp:  [97.6 ?F (36.4 ?C)-98 ?F (36.7 ?C)] 97.6 ?F (36.4 ?C) (03/19 0757) ?Pulse Rate:  [90-99] 99 (03/19 0757) ?Resp:  [17-20] 17 (03/19 0757) ?BP: (115-129)/(63-84) 129/84 (03/19 0757) ?SpO2:  [95 %-99 %] 96 % (03/19 0757) ?Weight:  [142.2 kg] 142.2 kg (03/19 0243) ?Last BM Date : 03/16/22 ? ?Weight change: ?Filed Weights  ? 03/16/22 1720 03/17/22 0615 03/18/22 0243  ?Weight: (!) 149.1 kg (!) 145.9 kg (!) 142.2 kg  ? ? ?Intake/Output:  ? ?Intake/Output Summary (Last 24 hours) at 03/18/2022 1103 ?Last data filed at 03/18/2022 1045 ?Gross per 24 hour  ?Intake 2416.47 ml  ?Output 9050 ml  ?Net -6633.53 ml  ? ?  ? ?Physical Exam: ?General:  Obese male sitting up in bed  No resp difficulty ?HEENT: normal ?Neck: supple. no JVD. Carotids 2+ bilat; no bruits. No lymphadenopathy or thryomegaly appreciated. ?Cor: PMI nondisplaced. Regular rate & rhythm. No rubs, gallops or murmurs. ?Lungs: clear ?Abdomen: obese soft, nontender, nondistended. No hepatosplenomegaly. No bruits or masses. Good bowel sounds. ?Extremities: no cyanosis, clubbing, rash, tr edema ?Neuro: alert & orientedx3, cranial nerves grossly intact. moves all 4 extremities w/o difficulty. Affect pleasant ? ?Telemetry: Sinus 90-100 Personally reviewed ? ? ?Labs: ?Basic Metabolic Panel: ?Recent Labs  ?Lab 03/14/22 ?0503 03/15/22 ?9381 03/16/22 ?0175 03/17/22 ?0127 03/18/22 ?1025  ?NA 130* 132* 128* 133* 131*  ?K 3.5 3.4* 3.9 3.2* 3.5  ?CL 94* 96* 94* 95* 91*  ?CO2 29 26 24 28 31   ?GLUCOSE 187* 140* 198* 172* 259*  ?BUN 13 14 18 15 11   ?CREATININE 1.05 1.22 1.41* 1.44* 1.28*   ?CALCIUM 8.5* 8.4* 8.4* 8.4* 8.1*  ?MG  --  1.8  --  1.7 1.6*  ?PHOS  --  4.5  --   --   --   ? ? ? ?Liver Function Tests: ?Recent Labs  ?Lab 03/13/22 ?1530 03/16/22 ?03/15/22 03/17/22 ?0127 03/18/22 ?03/19/22  ?AST 118* 3,970* 3,128* 1,201*  ?ALT 129* 1,733* 1,663* 1,154*  ?ALKPHOS 119 124 136* 150*  ?BILITOT 1.1 2.1* 1.6* 1.5*  ?PROT 7.3 6.4* 6.4* 6.5  ?ALBUMIN 2.9* 2.9* 2.7* 2.5*  ? ? ?Recent Labs  ?Lab 03/13/22 ?1530  ?LIPASE 25  ? ? ?No results for input(s): AMMONIA in the last 168 hours. ? ?CBC: ?Recent Labs  ?Lab 03/14/22 ?0503 03/15/22 ?03/16/22 03/16/22 ?2353 03/17/22 ?0127 03/18/22 ?03/19/22  ?WBC 8.1 8.0 12.3* 8.1 7.4  ?NEUTROABS  --   --   --  4.7 4.1  ?HGB 14.8 13.8 13.8 14.3 14.4  ?HCT 45.5 41.6 42.0 41.7 41.8  ?MCV 88.3 87.4 87.1 85.8 85.8  ?PLT 383 365 332 329 366  ? ? ? ?Cardiac Enzymes: ?No results for input(s): CKTOTAL, CKMB, CKMBINDEX, TROPONINI in the last 168 hours. ? ?BNP: ?BNP (last 3 results) ?Recent Labs  ?  06/27/21 ?1855 09/06/21 ?1157 03/13/22 ?1530  ?BNP 740.6* 367.6* 1,083.1*  ? ? ? ?ProBNP (last 3 results) ?No results for input(s): PROBNP in the last 8760 hours. ? ? ? ?Other results: ? ?Imaging: ?11/06/21 EKG SITE  RITE ? ?Result Date: 03/16/2022 ?If [employer] not attached, placement could not be confirmed due to current cardiac rhythm.  ? ? ?Medications:   ? ? ?Scheduled Medications: ? Chlorhexidine Gluconate Cloth  6 each Topical Daily  ? dapagliflozin propanediol  10 mg Oral Daily  ? digoxin  0.125 mg Oral Daily  ? furosemide  80 mg Intravenous BID  ? insulin aspart  0-20 Units Subcutaneous TID WC  ? insulin aspart  0-5 Units Subcutaneous QHS  ? insulin aspart  2 Units Subcutaneous TID WC  ? insulin glargine-yfgn  12 Units Subcutaneous Daily  ? sodium chloride flush  10-40 mL Intracatheter Q12H  ? sodium chloride flush  3 mL Intravenous Q12H  ? spironolactone  12.5 mg Oral Daily  ? ? ?Infusions: ? sodium chloride    ? heparin 2,400 Units/hr (03/18/22 0908)  ? milrinone 0.25 mcg/kg/min (03/18/22  0144)  ? ? ?PRN Medications: ?sodium chloride, acetaminophen, melatonin, ondansetron (ZOFRAN) IV, sodium chloride flush, sodium chloride flush ? ? ?Assessment/Progress:  ? ?1. 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 Will drop to 0.125 ?- Volume status much improved. Can stop IV lasix   ?- Off carvedilol ?- Continue digoxin ?- Continue spiro ?- Continue Farxiga ?- Add Entresto 24/26 bid  ?- Deferring cath for now with RA & LV clots. Can reconsider as needed ?- Not candidate for advanced therapies currently with size, RV dysfunction and recent noncompliance with meds ? ?2. Large LV thrombus: ?-Continue heparin gtt ?- Was off Eliquis x 2 weeks prior to admit. 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  ?- Will get cMRI tomorrow if body habitus not prohibitive ? ?3. History of PE s/p thrombectomy and thrombolysis with recurrent new PEs this admission: ?-He had been out of Eliquis for ~ 2 weeks ?-Heparin gtt for now [facility] plan as above ?  ?4. Elevated high sensitivity troponin: ?-Not consistent with ACS  ?-Likely supply demand ischemia in the setting of HF ?  ?5. AKI: ?- Likely ATN  ?- Continue hemodynamic support and diuresis  ?- Resolved ?  ?6. Shock liver ?- Hemodynamic support ?- LFTs continue to improve ?  ?7. History of right posterior PCA territory infarcts (cardioembolic) ?- At high risk for recurrent events with large LV thrombus ?- continue heparin ?  ?8. Nocturnal Mobtiz 2 heart block: ?-Noted during prior admission ?-Outpatient sleep study has been recommended, though not yet completed  ?  ?9. HLD: ?-Hold atorvastatin with significant transaminitis ?  ?10. DM2: ?-SSI ?-Farxiga ? ?11. Morbid obesity ?- Body mass index is 42.52 kg/m?. ?- Consider GLP-1-RA to help with weight los ? ?12.  Hypokalemia ?- supp ? ?13. Hyponatremia ?- Na 131. restrict FW ? ? ?Length of Stay: 2 ? ? ?Robert Meres MD ?03/18/2022, 11:03 AM ? ?Advanced Heart Failure Team ?Pager [phone] (M-F; 7a - 4p)  ?Please contact [facility] for night-coverage after hours (4p -7a ) and weekends on [email or web address] ? ?

## 2022-03-18 NOTE — Plan of Care (Signed)

## 2022-03-18 NOTE — Progress Notes (Signed)
ANTICOAGULATION CONSULT NOTE - Initial Consult ? ?Pharmacy Consult for heparin ?Indication:  bilateral PE and newly diagnosed apical mural thrombus ? ?No Known Allergies ? ?Patient Measurements: ?Height: 6' (182.9 cm) ?Weight: (!) 142.2 kg (313 lb 7.9 oz) ?IBW/kg (Calculated) : 77.6 ?Heparin Dosing Weight: 112.1 kg ? ?Vital Signs: ?Temp: 98 ?F (36.7 ?C) (03/19 0243) ?Temp Source: Oral (03/19 0243) ?BP: 116/70 (03/19 0243) ?Pulse Rate: 98 (03/19 0243) ? ?Labs: ?Recent Labs  ?  03/16/22 ?1700 03/16/22 ?1255 03/16/22 ?2001 03/17/22 ?0127 03/18/22 ?1749  ?HGB 13.8  --   --  14.3 14.4  ?HCT 42.0  --   --  41.7 41.8  ?PLT 332  --   --  329 366  ?LABPROT 20.9*  --   --  21.5*  --   ?INR 1.8*  --   --  1.9*  --   ?HEPARINUNFRC 0.14*   < > 0.27* 0.41 0.69  ?CREATININE 1.41*  --   --  1.44* 1.28*  ? < > = values in this interval not displayed.  ? ? ? ?Estimated Creatinine Clearance: 126.8 mL/min (A) (by C-G formula based on SCr of 1.28 mg/dL (H)). ? ? ?Medical History: ?Past Medical History:  ?Diagnosis Date  ? 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 a. 05/2021 noted during periods of sleep @ time of hospitalization for PE.  ? Cardiomyopathy (HCC)   ? a. 05/2021 Echo: EF <20%, glob HK. [place]:C ratio of 2.1:1 suggesting non-compaction. Gr2 DD. Sev reduced RV fxn. Nl PASP. Mildly dil RA. Mild MR; b. 10/2021 Echo: EF <20%, GrII DD; c. 10/2021 TEE: EF 20-25%, glob HK, sev red RV fxn.  ? Diabetes mellitus without complication (HCC)   ? HFrEF (heart failure with reduced ejection fraction) (HCC)   ? a. 05/2021 Echo: EF <20%. ? non-compaction.  ? Hypertension   ? Pulmonary embolism (HCC)   ? a. 05/2021 CTA Chest: PE extending from lobar arteris of RUL and RLL w/ concern for pulml infarct-->s/p thrombolysis and thrombectomy of RUL/RML/RLL.  ? Stroke [facility])   ? a. 10/2021 CT Head: Bilat cerebral infarcts w/ mod-sided acute to subacute R temporoparietal infarct; b. 10/2021 MRI: R post PCA territory infarcts w/o hemorrhage; c. 10/2021 TEE: Neg bubble study. No  LA/LAA thrombus. EF 20-25%, glob HK.  ? ? ?Medications:  ?Medications Prior to Admission  ?Medication Sig Dispense Refill Last Dose  ? carvedilol (COREG) 3.125 MG tablet Take 1 tablet (3.125 mg total) by mouth 2 (two) times daily with a meal. 60 tablet 1 03/16/2022 at 0800  ? furosemide (LASIX) 10 MG/ML injection Inject 4 mLs (40 mg total) into the vein daily. 4 mL 0 03/16/2022  ? heparin 44967 UT/250ML infusion Inject 2,300 Units/hr into the vein continuous. 250 mL 0 03/16/2022  ? ?Scheduled:  ? Chlorhexidine Gluconate Cloth  6 each Topical Daily  ? dapagliflozin propanediol  10 mg Oral Daily  ? digoxin  0.125 mg Oral Daily  ? furosemide  80 mg Intravenous BID  ? insulin aspart  0-20 Units Subcutaneous TID WC  ? insulin aspart  0-5 Units Subcutaneous QHS  ? sodium chloride flush  10-40 mL Intracatheter Q12H  ? sodium chloride flush  3 mL Intravenous Q12H  ? spironolactone  12.5 mg Oral Daily  ? ?Infusions:  ? sodium chloride    ? heparin 2,450 Units/hr (03/18/22 0143)  ? milrinone 0.25 mcg/kg/min (03/18/22 0144)  ? ?PRN: sodium chloride, acetaminophen, melatonin, ondansetron (ZOFRAN) IV, sodium chloride flush, sodium chloride flush ?Anti-infectives (  From admission, onward)  ? ? None  ? ?  ? ? ?Assessment: ?26 y.o. male, history of type 2 diabetes, hypertension, HFrEF, PE, obesity, CVA, presents to the emergency department for evaluation of emesis and palpitations x1 month. Found to have new pulmonary embolism in the segmental left upper lobe pulmonary artery, as well as a right atrial thrombus and LV thrombus. Pharmacy consulted for management of heparin drip in the setting of acute pulmonary embolism. ?  ?Transferred to [facility] for advanced heart failure management.  ?Heparin level therapeutic at 0.69 on drip at 2450 uts/hr, however on higher end, will decrease rate slightly. CBC stable and wnl. No s/x of bleeding per RN.  ? ?Goal of Therapy:  ?INR 2-3 ?Heparin level 0.3-0.7 units/ml ?Monitor platelets by  anticoagulation protocol: Yes ?  ?Plan: ?Decrease heparin drip to 2400 uts/hr ?Daily heparin level and CBC ?Monitor for s/s bleeding ? ?Drake Leach, PharmD ?PGY2 Cardiology Pharmacy Resident ?Phone: [phone] ?03/18/2022  7:36 AM ? ?Please check [email or web address] for unit-specific pharmacy phone numbers. ? ?

## 2022-03-19 ENCOUNTER — Encounter (HOSPITAL_COMMUNITY): Payer: Self-pay | Admitting: Internal Medicine

## 2022-03-19 ENCOUNTER — Inpatient Hospital Stay (HOSPITAL_COMMUNITY): Payer: 59

## 2022-03-19 ENCOUNTER — Other Ambulatory Visit (HOSPITAL_COMMUNITY): Payer: Self-pay

## 2022-03-19 DIAGNOSIS — I5023 Acute on chronic systolic (congestive) heart failure: Secondary | ICD-10-CM

## 2022-03-19 LAB — MAGNESIUM: Magnesium: 4.4 mg/dL — ABNORMAL HIGH (ref 1.7–2.4)

## 2022-03-19 LAB — CBC WITH DIFFERENTIAL/PLATELET
Abs Immature Granulocytes: 0.03 10*3/uL (ref 0.00–0.07)
Basophils Absolute: 0.1 10*3/uL (ref 0.0–0.1)
Basophils Relative: 1 %
Eosinophils Absolute: 0.2 10*3/uL (ref 0.0–0.5)
Eosinophils Relative: 2 %
HCT: 50 % (ref 39.0–52.0)
Hemoglobin: 16.7 g/dL (ref 13.0–17.0)
Immature Granulocytes: 0 %
Lymphocytes Relative: 28 %
Lymphs Abs: 2.1 10*3/uL (ref 0.7–4.0)
MCH: 29.1 pg (ref 26.0–34.0)
MCHC: 33.4 g/dL (ref 30.0–36.0)
MCV: 87.3 fL (ref 80.0–100.0)
Monocytes Absolute: 1.1 10*3/uL — ABNORMAL HIGH (ref 0.1–1.0)
Monocytes Relative: 15 %
Neutro Abs: 4.1 10*3/uL (ref 1.7–7.7)
Neutrophils Relative %: 54 %
Platelets: 401 10*3/uL — ABNORMAL HIGH (ref 150–400)
RBC: 5.73 MIL/uL (ref 4.22–5.81)
RDW: 12.5 % (ref 11.5–15.5)
WBC: 7.5 10*3/uL (ref 4.0–10.5)
nRBC: 0 % (ref 0.0–0.2)

## 2022-03-19 LAB — COMPREHENSIVE METABOLIC PANEL
ALT: 856 U/L — ABNORMAL HIGH (ref 0–44)
AST: 524 U/L — ABNORMAL HIGH (ref 15–41)
Albumin: 2.5 g/dL — ABNORMAL LOW (ref 3.5–5.0)
Alkaline Phosphatase: 191 U/L — ABNORMAL HIGH (ref 38–126)
Anion gap: 8 (ref 5–15)
BUN: 9 mg/dL (ref 6–20)
CO2: 27 mmol/L (ref 22–32)
Calcium: 8.4 mg/dL — ABNORMAL LOW (ref 8.9–10.3)
Chloride: 95 mmol/L — ABNORMAL LOW (ref 98–111)
Creatinine, Ser: 1.15 mg/dL (ref 0.61–1.24)
GFR, Estimated: >60 mL/min (ref >60)
Glucose, Bld: 240 mg/dL — ABNORMAL HIGH (ref 70–99)
Potassium: 3.7 mmol/L (ref 3.5–5.1)
Sodium: 130 mmol/L — ABNORMAL LOW (ref 135–145)
Total Bilirubin: 1.1 mg/dL (ref 0.3–1.2)
Total Protein: 6.9 g/dL (ref 6.5–8.1)

## 2022-03-19 LAB — GLUCOSE, CAPILLARY
Glucose-Capillary: 204 mg/dL — ABNORMAL HIGH (ref 70–99)
Glucose-Capillary: 259 mg/dL — ABNORMAL HIGH (ref 70–99)
Glucose-Capillary: 169 mg/dL — ABNORMAL HIGH (ref 70–99)
Glucose-Capillary: 227 mg/dL — ABNORMAL HIGH (ref 70–99)

## 2022-03-19 LAB — RETICULOCYTES
Immature Retic Fract: 16.7 % — ABNORMAL HIGH (ref 2.3–15.9)
RBC.: 5.71 MIL/uL (ref 4.22–5.81)
Retic Count, Absolute: 110.2 10*3/uL (ref 19.0–186.0)
Retic Ct Pct: 1.9 % (ref 0.4–3.1)

## 2022-03-19 LAB — FERRITIN: Ferritin: 95 ng/mL (ref 24–336)

## 2022-03-19 LAB — COOXEMETRY PANEL
Carboxyhemoglobin: 1.8 % — ABNORMAL HIGH (ref 0.5–1.5)
Methemoglobin: 0.7 % (ref 0.0–1.5)
O2 Saturation: 71.7 %
Total hemoglobin: 14.8 g/dL (ref 12.0–16.0)

## 2022-03-19 LAB — IRON AND TIBC
Iron: 37 ug/dL — ABNORMAL LOW (ref 45–182)
Saturation Ratios: 8 % — ABNORMAL LOW (ref 17.9–39.5)
TIBC: 448 ug/dL (ref 250–450)
UIBC: 411 ug/dL

## 2022-03-19 LAB — T3: T3, Total: 108 ng/dL (ref 71–180)

## 2022-03-19 LAB — HEPARIN LEVEL (UNFRACTIONATED): Heparin Unfractionated: 0.62 IU/mL (ref 0.30–0.70)

## 2022-03-19 LAB — VITAMIN B12: Vitamin B-12: 1907 pg/mL — ABNORMAL HIGH (ref 180–914)

## 2022-03-19 LAB — FOLATE: Folate: 15.4 ng/mL (ref >5.9)

## 2022-03-19 MED ORDER — SPIRONOLACTONE 25 MG PO TABS
25.0000 mg | ORAL_TABLET | Freq: Every day | ORAL | Status: DC
Start: 2022-03-19 — End: 2022-03-21
  Administered 2022-03-19 – 2022-03-21 (×3): 25 mg via ORAL
  Filled 2022-03-19 (×3): qty 1

## 2022-03-19 MED ORDER — POTASSIUM CHLORIDE CRYS ER 20 MEQ PO TBCR
40.0000 meq | EXTENDED_RELEASE_TABLET | Freq: Once | ORAL | Status: AC
Start: 2022-03-19 — End: 2022-03-19
  Administered 2022-03-19: 40 meq via ORAL
  Filled 2022-03-19: qty 2

## 2022-03-19 MED ORDER — INSULIN ASPART 100 UNIT/ML IJ SOLN
4.0000 [IU] | Freq: Three times a day (TID) | INTRAMUSCULAR | Status: DC
  Administered 2022-03-19 – 2022-03-20 (×4): 4 [IU] via SUBCUTANEOUS

## 2022-03-19 MED ORDER — GADOBUTROL 1 MMOL/ML IV SOLN
10.0000 mL | Freq: Once | INTRAVENOUS | Status: AC | PRN
  Administered 2022-03-19: 10 mL via INTRAVENOUS

## 2022-03-19 MED ORDER — INSULIN GLARGINE-YFGN 100 UNIT/ML ~~LOC~~ SOLN
15.0000 [IU] | Freq: Every day | SUBCUTANEOUS | Status: DC
Start: 2022-03-19 — End: 2022-03-20
  Administered 2022-03-19 – 2022-03-20 (×2): 15 [IU] via SUBCUTANEOUS
  Filled 2022-03-19 (×2): qty 0.15

## 2022-03-19 NOTE — Discharge Summary (Incomplete)
Advanced Heart Failure Team ? ?Discharge Summary  ? ?Patient ID: Robert Mccann ?MRN: [id number], DOB/AGE: 27-Jun-1996 28 y.o. Admit date: 03/16/2022 ?D/C date:     03/19/2022  ? ?Primary Discharge Diagnoses:  ?Acute on chronic systolic CHF ?Nonischemic cardiomyopathy ?Hx PE  ?LV thrombus ?Hx CVA ?DM II ?HTN ?Iron deficiency ? ?Hospital Course:  ?26 y.o. male with history of chronic systolic CHF (diagnosed 06/22), hx PE 06/22 s/p thrombectomy and thrombolysis, hx CVA, HTN, DM II, obesity, hx mobitz II HB.  ? ?Admitted to [facility] 11/22 with large posterior right  ?MCA stroke. Echo with EF < 20%, moderately reduced RV. ? ?Admitted to [facility] 03/13/22 with acute on chronic systolic CHF. Left upper lobe PE noted on CTA chest. Right atrial thrombus also incidentally noted. Had missed 2 weeks of Eliquis as outpatient, compliance with other medications uncertain. Echo with EF < 20% and LV thrombus. GDMT initially limited by hypotension and AKI. Developed AKI and shock liver with concern for MSOD 2/2 cardiogenic shock. He was subsequently transferred to [facility] for Advanced Heart Failure evaluation. PICC line placed for CVP and coox monitoring. Started on milrinone which was weaned off once diuresed. Initiated GDMT. R/LHC deferred d/t presence of RA and LV thrombus. cMRI ***.  ? ?Hospital Course by Problem: ?1. 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 Decrease milrinone to 0.125. ?- Off beta blocker d/t CGS ?- Continue digoxin ?- Continue spiro 25 mg daily ?- Continue Farxiga ?- Continue Entresto 24/26 bid  ?- Deferring cath for now with RA & LV clots. Can reconsider as needed ?- cMRI *** ?- Not candidate for advanced therapies currently with size, RV dysfunction and recent noncompliance with meds ?  ?2. Large LV thrombus/RA thrombus: ?- Continue heparin gtt ?- Was  off Eliquis x 2 weeks prior to admit. Likely not good warfarin candidate with noncompliance. Reiterated importance of adherence with medical therapy. 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  ?- Planning for cMRI today as above ?  ?3. History of PE s/p thrombectomy and thrombolysis with recurrent new PEs this admission: ?-He had been out of Eliquis for ~ 2 weeks ?-Heparin gtt for now [facility] plan as above ?  ?4. Elevated high sensitivity troponin: ?-Not consistent with ACS  ?-Likely supply demand ischemia in the setting of HF ?  ?5. AKI: ?- Likely ATN  ?- Continue hemodynamic support and diuresis  ?- Resolved ?  ?6. Shock liver ?- Hemodynamic support ?- LFTs continue to improve ?  ?7. History of right posterior PCA territory infarcts (cardioembolic) ?- At high risk for recurrent events with large LV thrombus ?- continue heparin ?  ?8. Nocturnal Mobtiz 2 heart block: ?-Noted during prior admission ?-Outpatient sleep study has been recommended, though not yet completed  ?  ?9. HLD: ?-Held atorvastatin with significant transaminitis ?  ?10. DM2: ?-A1c 8.9% on 03/13/22 ?-SSI ?-Farxiga ?  ?11. Morbid obesity ?- Body mass index is 41.53 kg/m?. ?- Consider GLP-1-RA to help with weight loss ?  ?12. Hypokalemia ?- supp ?- increase spiro as above ?  ?13. Hyponatremia ?- Na 130. restrict FW ?  ?14. Iron deficiency ?- Hgb 16.7 but iron stores low ?- Defer giving feraheme until after cMRI completed ? ? ? ?Discharge Weight Range: *** ?Discharge Vitals: Blood pressure (!) 146/111, pulse 98, temperature 98 ?F (36.7 ?  C), resp. rate 20, height 6' (1.829 m), weight (!) 138.9 kg, SpO2 99 %. ? ?Labs: ?Lab Results  ?Component Value Date  ? WBC 7.5 03/19/2022  ? HGB 16.7 03/19/2022  ? HCT 50.0 03/19/2022  ? MCV 87.3 03/19/2022  ? PLT 401 (H) 03/19/2022  ?  ?Recent Labs  ?Lab 03/19/22 ?0505  ?NA 130*  ?K 3.7  ?CL 95*  ?CO2 27  ?BUN 9  ?CREATININE 1.15  ?CALCIUM 8.4*  ?PROT 6.9  ?BILITOT 1.1  ?ALKPHOS 191*  ?ALT 856*  ?AST 524*  ?GLUCOSE 240*   ? ?Lab Results  ?Component Value Date  ? CHOL 231 (H) 11/06/2021  ? HDL 51 11/06/2021  ? LDLCALC 151 (H) 11/06/2021  ? TRIG 143 11/06/2021  ? ?BNP (last 3 results) ?Recent Labs  ?  06/27/21 ?1855 09/06/21 ?1157 03/13/22 ?1530  ?BNP 740.6* 367.6* 1,083.1*  ? ? ?ProBNP (last 3 results) ?No results for input(s): PROBNP in the last 8760 hours. ? ? ?Diagnostic Studies/Procedures  ? ?No results found. ? ?Discharge Medications  ? ?Allergies as of 03/19/2022   ?No Known Allergies ?  ?Med Rec must be completed prior to using this SMARTLINK*** ? ? ? ? ? ? ?Disposition  ? ?The patient will be discharged in stable condition to home. ? ?  ? ? ?Duration of Discharge Encounter: Greater than 35 minutes  ? ?Signed, ?Aimee Timmons N  ?03/19/2022, 4:07 PM  ?

## 2022-03-19 NOTE — Progress Notes (Signed)
Heart Failure Navigator Progress Note ? ?Assessed for Heart & Vascular TOC clinic readiness.  ?Patient does not meet criteria due to followed by HF rounding..  ? ?Navigator available for reassessment of patient.  ? ?Rhae Hammock, BSN, RN ?Heart Failure Nurse Navigator ?[phone]   ?

## 2022-03-19 NOTE — Progress Notes (Signed)
CARDIAC REHAB PHASE I  ? ?PRE:  Rate/Rhythm: 103 ST in bed ? ?  BP: sitting 127/82 ? ?  SaO2: 97 RA ? ?MODE:  Ambulation: 580 ft  ? ?POST:  Rate/Rhythm: 124 ST ? ?  BP: sitting 121/88  ? ?  SaO2: 97 RA ? ?Initially slightly wobbly walking, this resolved. Pt c/o left calf "cramp" with distance. He sts he has had this cramp with walking for a while.  ? ?Discussed with pt and girlfriend HF booklet including daily wts, signs of fluid, importance of meds, low sodium, and DM diet. Receptive. Encouraged cooking at home, more natural foods. He drinks significant amounts of juice and other sugared drinks therefore encouraged cessation, allowing sugar-substituted drinks. Also discussed fluid restrictions. Pt overwhelmed but seemingly understanding the need for changes. Will f/u. ?[id number]  ? ?Robert Mccann Ethelda Chick CES, ACSM ?03/19/2022 ?11:48 AM ? ? ? ? ?

## 2022-03-19 NOTE — Plan of Care (Signed)

## 2022-03-19 NOTE — TOC Benefit Eligibility Note (Signed)
Patient Advocate Encounter ? ?Prior Authorization for Ozempic (0.25 or 0.5 mg/Dose) 2 mg/1.5 ml pen-injectors  has been approved.   ? ?PA# [id number] ?Effective dates: 03/19/2022 through 03/20/2023 ? ?Patients co-pay is $15.00.  ? ? ? ?Roland Earl, CPhT ?Pharmacy Patient Advocate Specialist ?[facility] Pharm[facility] Patient Advocate Team ?Direct Number: [phone]  Fax: [phone]  ?

## 2022-03-19 NOTE — TOC Benefit Eligibility Note (Signed)
Patient Advocate Encounter ?  ?Received notification that prior authorization for Ozempic (0.25 or 0.5 mg/Dose) 2 mg/1.5 ml pen-injectors is required. ?  ?PA submitted on 03/19/2022 ?Key [id number] ?Status is pending ?   ? ? ? ?Lyndel Safe, CPhT ?Pharmacy Patient Advocate Specialist ?[place] Patient Advocate Team ?Direct Number: [phone]  Fax: [phone]  ?

## 2022-03-19 NOTE — TOC CM/SW Note (Signed)
HF TOC CM spoke to pt's girlfriend at bedside. Pt was down for a procedure. Girlfriend states pt works full-time and will need disability. She will have pt contact his Human Resources to check on his benefits. Isidoro Donning RN3 CCM, Heart Failure TOC CM [phone]  ?

## 2022-03-19 NOTE — Progress Notes (Signed)
?PROGRESS NOTE ? ? ? ?Valerie Salts Pasko  [id number] DOB: [birth year] DOA: 03/16/2022 ?PCP: Ellene Route  ?Narrative: 26 y.o. male chronically ill with chronic systolic CHF, EF [id number], CVA, PE s/p thrombectomy, DM2, HTN, and obesity , was admitted to [facility] 3/14 with dyspnea fatigue, chest discomfort and emesis, reported noncompliance with Eliquis X 2 weeks, CTA chest noted new PE. ?-Echo noted LV thrombus, concerns for noncompaction cardiomyopathy, EF<20%, global hypokinesis of LV, grade 2 DD, severe RV dysfunction ?-He was started on IV heparin, IV Lasix, care limited by soft BPs, subsequently transferred to [facility] for advanced heart failure team evaluation and management. ?-Started on milrinone ? ? ?Subjective:  ? ? ?Assessment & Plan: ? ?Acute on chronic systolic, biventricular CHF ?Cardiogenic shock ?Low output state ?-Echo noted LV thrombus, concerns for noncompaction cardiomyopathy, EF<20%, global hypokinesis of LV, grade 2 DD, severe RV dysfunction ?-Started on milrinone 3/17 ?-Diuresed with IV Lasix, also on Aldactone, Farxiga, digoxin,, he is 13 L ?-Off Lasix now,  ?-Per heart failure team ?  ?LV thrombus: ?-History of noncompliance with Eliquis X 2 weeks PTA ?-On IV heparin, change to DOAC ?-Likely not a good candidate for Coumadin therapy on account of poor compliance ?  ?Recurrent pulmonary embolism: ?-Due to noncompliance as noted above, see above ? ?Mild AKI ?-Secondary to cardiorenal syndrome, now on milrinone, improving ? ?Shock liver ?-abnormal LFTs, likely secondary to shock liver and passive hepatic congestion ?-LFTs were normal prior to that, had a ultrasound at [facility] 3/17 which noted hepatic steatosis, no acute findings ?-Improving ? ?Mildly elevated TSH ?-Free T4 is mildly elevated ?-This is likely erroneous, cannot be correlated-on further discussion patient does report multivitamin use in the past week, could be affecting T4 levels ?-Recommend repeat TSH/T4 in 6 to 8 weeks off  multivitamins/biotin ?  ?Essential hypertension: ?-Stable, currently on milrinone, Coreg discontinued ?  ?Hyperlipidemia: ?Continue atorvastatin 80 mg daily ?  ?Type 2 diabetes ?-CBGs still elevated, increase Semglee and meal coverage, continue [place] ?-Metformin on hold ? ?DVT prophylaxis: Heparin GTT ?Code Status: Full code ? ?Procedures:  ? ?Antimicrobials:  ? ? ?Objective: ?Vitals:  ? 03/18/22 2000 03/18/22 2332 03/19/22 0606 03/19/22 1001  ?BP: 119/85 105/73 117/75   ?Pulse: (!) 102 90 90 98  ?Resp: 19 16 20    ?Temp: 97.8 ?F (36.6 ?C) 98 ?F (36.7 ?C) 97.6 ?F (36.4 ?C)   ?TempSrc: Oral Oral Oral   ?SpO2: 98% 95% 99%   ?Weight:   (!) 138.9 kg   ?Height:      ? ? ?Intake/Output Summary (Last 24 hours) at 03/19/2022 1058 ?Last data filed at 03/19/2022 0608 ?Gross per 24 hour  ?Intake 1373.25 ml  ?Output 4050 ml  ?Net -2676.75 ml  ? ?Filed Weights  ? 03/17/22 0615 03/18/22 0243 03/19/22 0606  ?Weight: (!) 145.9 kg (!) 142.2 kg (!) 138.9 kg  ? ? ?Examination: ? ?Morbidly obese pleasant male sitting up in bed, AAOx3, no distress ?HEENT: Neck obese unable to assess JVD ?CVS: S1-S2, regular rate rhythm ?Lungs: Distant breath sounds, otherwise clear ?Abdomen: Soft, nontender, bowel sounds present ?Extremities: No edema, right upper extremity PICC line ?Neuro: Moves all extremities, no localizing signs ? ?Data Reviewed:  ? ?CBC: ?Recent Labs  ?Lab 03/15/22 ?0653 03/16/22 ?[id number] 03/17/22 ?0127 03/18/22 ?[id number] 03/19/22 ?0505  ?WBC 8.0 12.3* 8.1 7.4 7.5  ?NEUTROABS  --   --  4.7 4.1 4.1  ?HGB 13.8 13.8 14.3 14.4 16.7  ?HCT 41.6 42.0 41.7 41.8 50.0  ?MCV 87.4 87.1 85.8  85.8 87.3  ?PLT 365 332 329 366 401*  ? ?Basic Metabolic Panel: ?Recent Labs  ?Lab 03/15/22 ?0653 03/16/22 ?[id number] 03/17/22 ?0127 03/18/22 ?[id number] 03/19/22 ?0505  ?NA 132* 128* 133* 131* 130*  ?K 3.4* 3.9 3.2* 3.5 3.7  ?CL 96* 94* 95* 91* 95*  ?CO2 26 24 28 31 27   ?GLUCOSE 140* 198* 172* 259* 240*  ?BUN 14 18 15 11 9   ?CREATININE 1.22 1.41* 1.44* 1.28* 1.15  ?CALCIUM  8.4* 8.4* 8.4* 8.1* 8.4*  ?MG 1.8  --  1.7 1.6* 4.4*  ?PHOS 4.5  --   --   --   --   ? ?GFR: ?Estimated Creatinine Clearance: 139.3 mL/min (by C-G formula based on SCr of 1.15 mg/dL). ?Liver Function Tests: ?Recent Labs  ?Lab 03/13/22 ?1530 03/16/22 ?[id number] 03/17/22 ?0127 03/18/22 ?[id number] 03/19/22 ?0505  ?AST 118* 3,970* 3,128* 1,201* 524*  ?ALT 129* 1,733* 1,663* 1,154* 856*  ?ALKPHOS 119 124 136* 150* 191*  ?BILITOT 1.1 2.1* 1.6* 1.5* 1.1  ?PROT 7.3 6.4* 6.4* 6.5 6.9  ?ALBUMIN 2.9* 2.9* 2.7* 2.5* 2.5*  ? ?Recent Labs  ?Lab 03/13/22 ?1530  ?LIPASE 25  ? ?No results for input(s): AMMONIA in the last 168 hours. ?Coagulation Profile: ?Recent Labs  ?Lab 03/13/22 ?1530 03/16/22 ?[id number] 03/17/22 ?0127  ?INR 1.3* 1.8* 1.9*  ? ?Cardiac Enzymes: ?No results for input(s): CKTOTAL, CKMB, CKMBINDEX, TROPONINI in the last 168 hours. ?BNP (last 3 results) ?No results for input(s): PROBNP in the last 8760 hours. ?HbA1C: ?No results for input(s): HGBA1C in the last 72 hours. ?CBG: ?Recent Labs  ?Lab 03/18/22 ?[id number] 03/18/22 ?1151 03/18/22 ?1614 03/18/22 ?2142 03/19/22 ?[id number]  ?GLUCAP 211* 206* 200* 285* 204*  ? ?Lipid Profile: ?No results for input(s): CHOL, HDL, LDLCALC, TRIG, CHOLHDL, LDLDIRECT in the last 72 hours. ?Thyroid Function Tests: ?Recent Labs  ?  03/17/22 ?0127 03/18/22 ?[id number]  ?TSH 5.035*  --   ?FREET4  --  1.56*  ? ?Anemia Panel: ?Recent Labs  ?  03/19/22 ?0505  ?[id number] 1,907*  ?FOLATE 15.4  ?FERRITIN 95  ?TIBC 448  ?IRON 37*  ?RETICCTPCT 1.9  ? ?Urine analysis: ?   ?Component Value Date/Time  ? COLORURINE YELLOW (A) 11/05/2021 2056  ? APPEARANCEUR CLEAR (A) 11/05/2021 2056  ? LABSPEC 1.030 11/05/2021 2056  ? PHURINE 5.0 11/05/2021 2056  ? GLUCOSEU >=500 (A) 11/05/2021 2056  ? [place] NEGATIVE 11/05/2021 2056  ? [place] NEGATIVE 11/05/2021 2056  ? [place] NEGATIVE 11/05/2021 2056  ? PROTEINUR 30 (A) 11/05/2021 2056  ? NITRITE NEGATIVE 11/05/2021 2056  ? LEUKOCYTESUR NEGATIVE 11/05/2021 2056  ? ?Sepsis  Labs: ?@LABRCNTIP (procalcitonin:4,lacticidven:4) ? ?) ?Recent Results (from the past 240 hour(s))  ?Resp Panel by RT-PCR (Flu A&B, Covid) Nasopharyngeal Swab     Status: None  ? Collection Time: 03/14/22  7:59 AM  ? Specimen: Nasopharyngeal Swab; Nasopharyngeal(NP) swabs in vial transport medium  ?Result Value Ref Range Status  ? SARS Coronavirus 2 by RT PCR NEGATIVE NEGATIVE Final  ?  Comment: (NOTE) ?SARS-CoV-2 target nucleic acids are NOT DETECTED. ? ?The SARS-CoV-2 RNA is generally detectable in upper respiratory ?specimens during the acute phase of infection. The lowest ?concentration of SARS-CoV-2 viral copies this assay can detect is ?138 copies/mL. A negative result does not preclude SARS-Cov-2 ?infection and should not be used as the sole basis for treatment or ?other patient management decisions. A negative result may occur with  ?improper specimen collection/handling, submission of specimen other ?than nasopharyngeal swab, presence of viral mutation(s) within the ?areas targeted  by this assay, and inadequate number of viral ?copies(<138 copies/mL). A negative result must be combined with ?clinical observations, patient history, and epidemiological ?information. The expected result is Negative. ? ?Fact Sheet for Patients:  ?EntrepreneurPulse.com.au ? ?Fact Sheet for Healthcare Providers:  ?IncredibleEmployment.be ? ?This test is no t yet approved or cleared by the [place] FDA and  ?has been authorized for detection and/or diagnosis of SARS-CoV-2 by ?FDA under an Emergency Use Authorization (EUA). This EUA will remain  ?in effect (meaning this test can be used) for the duration of the ?COVID-19 declaration under Section 564(b)(1) of the Act, 21 ?U.S.C.section 360bbb-3(b)(1), unless the authorization is terminated  ?or revoked sooner.  ? ? ?  ? Influenza A by PCR NEGATIVE NEGATIVE Final  ? Influenza B by PCR NEGATIVE NEGATIVE Final  ?  Comment: (NOTE) ?The Xpert  Xpress SARS-CoV-2/FLU/RSV plus assay is intended as an aid ?in the diagnosis of influenza from Nasopharyngeal swab specimens and ?should not be used as a sole basis for treatment. Nasal washings and ?aspirates are unacceptable for Xpe

## 2022-03-19 NOTE — TOC Benefit Eligibility Note (Signed)
Patient Advocate Encounter ? ?Insurance verification completed.   ? ?The patient is currently admitted and upon discharge could be taking Entresto 24-26 mg. ? ?The current 30 day co-pay is, $15.00.  ? ?The patient is currently admitted and upon discharge could be taking Jardiance 10 mg. ? ?The current 30 day co-pay is, $15.00.  ? ?The patient is currently admitted and upon discharge could be taking Ozempic ? ?Requires Prior Authorization ? ?The patient is insured through [employer]  ? ? ? ?Lyndel Safe, CPhT ?Pharmacy Patient Advocate Specialist ?[place] Patient Advocate Team ?Direct Number: [phone]  Fax: [phone] ? ? ? ? ? ?  ?

## 2022-03-19 NOTE — Progress Notes (Signed)
ANTICOAGULATION CONSULT NOTE ? ?Pharmacy Consult for heparin ?Indication:  bilateral PE and newly diagnosed apical mural thrombus ? ?No Known Allergies ? ?Patient Measurements: ?Height: 6' (182.9 cm) ?Weight: (!) 138.9 kg (306 lb 3.5 oz) ?IBW/kg (Calculated) : 77.6 ?Heparin Dosing Weight: 112.1 kg ? ?Vital Signs: ?Temp: 97.6 ?F (36.4 ?C) (03/20 0606) ?Temp Source: Oral (03/20 0606) ?BP: 117/75 (03/20 0606) ?Pulse Rate: 90 (03/20 0606) ? ?Labs: ?Recent Labs  ?  03/17/22 ?0127 03/18/22 ?1696 03/18/22 ?1400 03/19/22 ?0505  ?HGB 14.3 14.4  --  16.7  ?HCT 41.7 41.8  --  50.0  ?PLT 329 366  --  401*  ?LABPROT 21.5*  --   --   --   ?INR 1.9*  --   --   --   ?HEPARINUNFRC 0.41 0.69 0.51 0.62  ?CREATININE 1.44* 1.28*  --  1.15  ? ? ? ?Estimated Creatinine Clearance: 139.3 mL/min (by C-G formula based on SCr of 1.15 mg/dL). ? ? ?Medical History: ?Past Medical History:  ?Diagnosis Date  ? 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 a. 05/2021 noted during periods of sleep @ time of hospitalization for PE.  ? Cardiomyopathy (HCC)   ? a. 05/2021 Echo: EF <20%, glob HK. [place]:C ratio of 2.1:1 suggesting non-compaction. Gr2 DD. Sev reduced RV fxn. Nl PASP. Mildly dil RA. Mild MR; b. 10/2021 Echo: EF <20%, GrII DD; c. 10/2021 TEE: EF 20-25%, glob HK, sev red RV fxn.  ? Diabetes mellitus without complication (HCC)   ? HFrEF (heart failure with reduced ejection fraction) (HCC)   ? a. 05/2021 Echo: EF <20%. ? non-compaction.  ? Hypertension   ? Pulmonary embolism (HCC)   ? a. 05/2021 CTA Chest: PE extending from lobar arteris of RUL and RLL w/ concern for pulml infarct-->s/p thrombolysis and thrombectomy of RUL/RML/RLL.  ? Stroke [facility])   ? a. 10/2021 CT Head: Bilat cerebral infarcts w/ mod-sided acute to subacute R temporoparietal infarct; b. 10/2021 MRI: R post PCA territory infarcts w/o hemorrhage; c. 10/2021 TEE: Neg bubble study. No LA/LAA thrombus. EF 20-25%, glob HK.  ? ? ?Medications:  ?Medications Prior to Admission  ?Medication Sig Dispense Refill Last  Dose  ? carvedilol (COREG) 3.125 MG tablet Take 1 tablet (3.125 mg total) by mouth 2 (two) times daily with a meal. 60 tablet 1 03/16/2022 at 0800  ? furosemide (LASIX) 10 MG/ML injection Inject 4 mLs (40 mg total) into the vein daily. 4 mL 0 03/16/2022  ? heparin 78938 UT/250ML infusion Inject 2,300 Units/hr into the vein continuous. 250 mL 0 03/16/2022  ? ?Scheduled:  ? Chlorhexidine Gluconate Cloth  6 each Topical Daily  ? dapagliflozin propanediol  10 mg Oral Daily  ? digoxin  0.125 mg Oral Daily  ? insulin aspart  0-20 Units Subcutaneous TID WC  ? insulin aspart  0-5 Units Subcutaneous QHS  ? insulin aspart  4 Units Subcutaneous TID WC  ? insulin glargine-yfgn  15 Units Subcutaneous Daily  ? sacubitril-valsartan  1 tablet Oral BID  ? sodium chloride flush  10-40 mL Intracatheter Q12H  ? sodium chloride flush  3 mL Intravenous Q12H  ? spironolactone  12.5 mg Oral Daily  ? ?Infusions:  ? sodium chloride    ? heparin 2,400 Units/hr (03/19/22 0353)  ? milrinone 0.25 mcg/kg/min (03/19/22 1017)  ? ?PRN: sodium chloride, acetaminophen, melatonin, ondansetron (ZOFRAN) IV, sodium chloride flush, sodium chloride flush ?Anti-infectives (From admission, onward)  ? ? None  ? ?  ? ? ?Assessment: ?26 y.o. male,  history of type 2 diabetes, hypertension, HFrEF, PE, obesity, CVA, presents to the emergency department for evaluation of emesis and palpitations x1 month. Found to have new pulmonary embolism in the segmental left upper lobe pulmonary artery, as well as a right atrial thrombus and LV thrombus. Pharmacy consulted for management of heparin drip in the setting of acute pulmonary embolism. ?  ?Heparin level therapeutic at 0.41 on 2400 units/hr. No s/sx of bleeding or infusion issues. CBC stable ? ?Goal of Therapy:  ?Heparin level 0.3-0.7 ?Monitor platelets by anticoagulation protocol: Yes ?  ?Plan: ?Continue heparin drip at 2400 uts/hr ?Daily heparin level and CBC ?Monitor for s/s bleeding ? ? ?Leota Sauers Pharm.D. CPP,  BCPS ?Clinical Pharmacist ?[phone] ?03/19/2022 7:34 AM  ? ? ?Please check AMION for all [facility] Pharmacy phone numbers ?After 10:00 PM, call Main Pharmacy [phone] ? ? ?

## 2022-03-19 NOTE — Progress Notes (Addendum)
? ? ?Advanced Heart Failure Rounding Note ? ? ?Subjective:   ? ?Co-ox 71.7% on milrinone 0.25.  ? ?Negative 5.8L last 24 hrs. Weight down another 7 lb. CVP 3 ? ?BP stable.  ? ?On heparin for LV clot and PE. No bleeding ? ?Feeling well. No CP or dyspnea. Denies orthopnea and PND.  ? ? ?Objective:   ?Weight Range: 328>>306 lb ? ?Vital Signs:   ?Temp:  [97.6 ?F (36.4 ?C)-98.4 ?F (36.9 ?C)] 97.6 ?F (36.4 ?C) (03/20 0606) ?Pulse Rate:  [90-105] 90 (03/20 0606) ?Resp:  [16-20] 20 (03/20 0606) ?BP: (105-129)/(73-90) 117/75 (03/20 0606) ?SpO2:  [95 %-99 %] 99 % (03/20 0606) ?Weight:  [138.9 kg] 138.9 kg (03/20 0606) ?Last BM Date : 03/16/22 ? ?Weight change: ?Filed Weights  ? 03/17/22 0615 03/18/22 0243 03/19/22 0606  ?Weight: (!) 145.9 kg (!) 142.2 kg (!) 138.9 kg  ? ? ?Intake/Output:  ? ?Intake/Output Summary (Last 24 hours) at 03/19/2022 0702 ?Last data filed at 03/19/2022 0608 ?Gross per 24 hour  ?Intake 1613.25 ml  ?Output 7400 ml  ?Net -5786.75 ml  ?  ? ?Physical Exam: ?General:  No distress. Sitting up in bed. ?HEENT: normal ?Neck: supple. no JVD. Carotids 2+ bilat; no bruits. ?Cor: PMI nondisplaced. Regular rate & rhythm. No rubs, gallops or murmurs. ?Lungs: clear ?Abdomen: soft, nontender, nondistended. No hepatosplenomegaly.  ?Extremities: no cyanosis, clubbing, rash, edema, RUE PICC ?Neuro: alert & orientedx3, cranial nerves grossly intact. moves all 4 extremities w/o difficulty. Affect pleasant ? ? ?Telemetry: Sinus 80s-90s ? ? ?Labs: ?Basic Metabolic Panel: ?Recent Labs  ?Lab 03/15/22 ?0653 03/16/22 ?[id number] 03/17/22 ?0127 03/18/22 ?[id number] 03/19/22 ?0505  ?NA 132* 128* 133* 131* 130*  ?K 3.4* 3.9 3.2* 3.5 3.7  ?CL 96* 94* 95* 91* 95*  ?CO2 26 24 28 31 27   ?GLUCOSE 140* 198* 172* 259* 240*  ?BUN 14 18 15 11 9   ?CREATININE 1.22 1.41* 1.44* 1.28* 1.15  ?CALCIUM 8.4* 8.4* 8.4* 8.1* 8.4*  ?MG 1.8  --  1.7 1.6* 4.4*  ?PHOS 4.5  --   --   --   --   ? ? ?Liver Function Tests: ?Recent Labs  ?Lab 03/13/22 ?1530 03/16/22 ?[id number]  03/17/22 ?0127 03/18/22 ?[id number] 03/19/22 ?0505  ?AST 118* 3,970* 3,128* 1,201* 524*  ?ALT 129* 1,733* 1,663* 1,154* 856*  ?ALKPHOS 119 124 136* 150* 191*  ?BILITOT 1.1 2.1* 1.6* 1.5* 1.1  ?PROT 7.3 6.4* 6.4* 6.5 6.9  ?ALBUMIN 2.9* 2.9* 2.7* 2.5* 2.5*  ? ?Recent Labs  ?Lab 03/13/22 ?1530  ?LIPASE 25  ? ?No results for input(s): AMMONIA in the last 168 hours. ? ?CBC: ?Recent Labs  ?Lab 03/15/22 ?0653 03/16/22 ?[id number] 03/17/22 ?0127 03/18/22 ?[id number] 03/19/22 ?0505  ?WBC 8.0 12.3* 8.1 7.4 7.5  ?NEUTROABS  --   --  4.7 4.1 4.1  ?HGB 13.8 13.8 14.3 14.4 16.7  ?HCT 41.6 42.0 41.7 41.8 50.0  ?MCV 87.4 87.1 85.8 85.8 87.3  ?PLT 365 332 329 366 401*  ? ? ?Cardiac Enzymes: ?No results for input(s): CKTOTAL, CKMB, CKMBINDEX, TROPONINI in the last 168 hours. ? ?BNP: ?BNP (last 3 results) ?Recent Labs  ?  06/27/21 ?1855 09/06/21 ?1157 03/13/22 ?1530  ?BNP 740.6* 367.6* 1,083.1*  ? ? ?ProBNP (last 3 results) ?No results for input(s): PROBNP in the last 8760 hours. ? ? ? ?Other results: ? ?Imaging: ?No results found. ? ? ?Medications:   ? ? ?Scheduled Medications: ? Chlorhexidine Gluconate Cloth  6 each Topical Daily  ? dapagliflozin propanediol  10 mg Oral Daily  ? digoxin  0.125 mg Oral Daily  ? insulin aspart  0-20 Units Subcutaneous TID WC  ? insulin aspart  0-5 Units Subcutaneous QHS  ? insulin aspart  2 Units Subcutaneous TID WC  ? insulin glargine-yfgn  12 Units Subcutaneous Daily  ? sacubitril-valsartan  1 tablet Oral BID  ? sodium chloride flush  10-40 mL Intracatheter Q12H  ? sodium chloride flush  3 mL Intravenous Q12H  ? spironolactone  12.5 mg Oral Daily  ? ? ?Infusions: ? sodium chloride    ? heparin 2,400 Units/hr (03/19/22 0353)  ? milrinone 0.25 mcg/kg/min (03/19/22 [id number])  ? ? ?PRN Medications: ?sodium chloride, acetaminophen, melatonin, ondansetron (ZOFRAN) IV, sodium chloride flush, sodium chloride flush ? ? ?Assessment/Progress:  ? ?1. 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 Decrease milrinone to 0.125. ?- CVP 3. IV lasix stopped 03/19.  ?- Off carvedilol ?- Continue digoxin ?- Increase spiro to 25 mg daily ?- Continue Farxiga ?- Continue Entresto 24/26 bid  ?- Deferring cath for now with RA & LV clots. Can reconsider as needed ?- cMRI today if body habitus not prohibitive ?- Not candidate for advanced therapies currently with size, RV dysfunction and recent noncompliance with meds ?- CR consult ? ?2. Large LV thrombus: ?-Continue heparin gtt ?- Was off Eliquis x 2 weeks prior to admit. Likely not good warfarin candidate with noncompliance. Reiterated importance of adherence with medical therapy. 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  ?- Planning for cMRI today as above ? ?3. 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  ?4. Elevated high sensitivity troponin: ?-Not consistent with ACS  ?-Likely supply demand ischemia in the setting of HF ?  ?5. AKI: ?- Likely ATN  ?- Continue hemodynamic support and diuresis  ?- Resolved ?  ?6. Shock liver ?- Hemodynamic support ?- LFTs continue to improve ?  ?7. History of right posterior PCA territory infarcts (cardioembolic) ?- At high risk for recurrent events with large LV thrombus ?- continue heparin ?  ?8. Nocturnal Mobtiz 2 heart block: ?-Noted during prior admission ?-Outpatient sleep study has been recommended, though not yet completed  ?  ?9. HLD: ?-Hold atorvastatin with significant transaminitis ?  ?10. DM2: ?-A1c 8.9% on 03/13/22 ?-SSI ?-Farxiga ? ?11. Morbid obesity ?- Body mass index is 41.53 kg/m?. ?- Consider GLP-1-RA to help with weight loss ? ?12. Hypokalemia ?- supp ?- increase spiro as above ? ?13. Hyponatremia ?- Na 130. restrict FW ? ?14. Iron deficiency ?- Hgb 16.7  but iron stores low ?- Defer giving feraheme until after cMRI completed ? ?Consult TOC HF CSW regarding disability application.  ? ? ?Length of Stay: 3 ? ? ?Leata Mouse MD ?03/19/2022, 7:02 AM ? ?Advanced Heart Failure Team ?Pager [phone] (M-F; 7a - 4p)  ?Please contact [facility] for night-coverage after hours (4p -7a ) and weekends on [email or web address] ? ?Patient seen and examined with the above-signed Advanced Practice Provider and/or Housestaff. I personally reviewed laboratory data, imaging studies and relevant notes. I independently examined the patient and formulated the important aspects of the plan. I have edited the note to reflect any of my changes or  salient points. I have personally discussed the plan with the patient and/or family. ? ?Continues to improve on IV milrinone and diuresis. Now fully diuresed. Remains on heparin for LV clot.  ? ?General:  Sitting up in bed. No resp difficulty ?HEENT: normal ?Neck: supple. no JVD. Carotids 2+ bilat; no bruits. No lymphadenopathy or thryomegaly appreciated. ?Cor: PMI nondisplaced. Regular rate & rhythm. No rubs, gallops or murmurs. ?Lungs: clear ?Abdomen: obese soft, nontender, nondistended. No hepatosplenomegaly. No bruits or masses. Good bowel sounds. ?Extremities: no cyanosis, clubbing, rash, edema ?Neuro: alert & orientedx3, cranial nerves grossly intact. moves all 4 extremities w/o difficulty. Affect pleasant ? ?Wean milrinone to 0.125 today. Follow CVP and co-ox. Increase spiro. Plan cMRI today if size not prohibitive.  ? ?Glori Bickers, MD  ?8:46 AM ? ? ?

## 2022-03-19 NOTE — Progress Notes (Signed)
Inpatient Diabetes Program Recommendations ? ?AACE/ADA: New Consensus Statement on Inpatient Glycemic Control (2015) ? ?Target Ranges:  Prepandial:   less than 140 mg/dL ?     Peak postprandial:   less than 180 mg/dL (1-2 hours) ?     Critically ill patients:  140 - 180 mg/dL  ? ?Lab Results  ?Component Value Date  ? GLUCAP 204 (H) 03/19/2022  ? HGBA1C 8.9 (H) 03/13/2022  ? ? ?Review of Glycemic Control ? Latest Reference Range & Units 03/17/22 16:06 03/17/22 21:22 03/18/22 06:14 03/18/22 11:51 03/18/22 16:14 03/18/22 21:42 03/19/22 06:39  ?Glucose-Capillary 70 - 99 mg/dL 295 (H) 621 (H) 308 (H) 206 (H) 200 (H) 285 (H) 204 (H)  ? ?Diabetes history: DM 2 ?Outpatient Diabetes medications:  ?None ?Current orders for Inpatient glycemic control:  ?Novolog resistant tid with meals and HS ?Novolog 4 units tid with meals ?Semglee 15 units daily ?Inpatient Diabetes Program Recommendations:   ? ?Consider increasing Semglee to 20 units daily.  ?Agree with the addition of SGLT-2 and GLP-1 at discharge.  ? ?Thanks,  ?Beryl Meager, RN, BC-ADM ?Inpatient Diabetes Coordinator ?Pager [phone]  (8a-5p) ? ? ? ?

## 2022-03-19 NOTE — Progress Notes (Signed)
Mobility Specialist Progress Note  ? ? 03/19/22 1223  ?Mobility  ?Activity Ambulated independently in hallway  ?Level of Assistance Independent  ?Assistive Device None  ?Distance Ambulated (ft) 420 ft  ?Activity Response Tolerated well  ?$Mobility charge 1 Mobility  ? ?During Mobility: 118 HR ?Post-Mobility: 109 HR ? ?Pt received in bed and agreeable. No complaints. Returned to sitting EOB with call bell in reach.  ? ?Robert Mccann ?Mobility Specialist  ?  ?

## 2022-03-20 LAB — CBC WITH DIFFERENTIAL/PLATELET
Abs Immature Granulocytes: 0 10*3/uL (ref 0.00–0.07)
Basophils Absolute: 0.1 10*3/uL (ref 0.0–0.1)
Basophils Relative: 2 %
Eosinophils Absolute: 0.3 10*3/uL (ref 0.0–0.5)
Eosinophils Relative: 4 %
HCT: 51.9 % (ref 39.0–52.0)
Hemoglobin: 17.3 g/dL — ABNORMAL HIGH (ref 13.0–17.0)
Lymphocytes Relative: 38 %
Lymphs Abs: 2.8 10*3/uL (ref 0.7–4.0)
MCH: 29.1 pg (ref 26.0–34.0)
MCHC: 33.3 g/dL (ref 30.0–36.0)
MCV: 87.4 fL (ref 80.0–100.0)
Monocytes Absolute: 0.4 10*3/uL (ref 0.1–1.0)
Monocytes Relative: 5 %
Neutro Abs: 3.7 10*3/uL (ref 1.7–7.7)
Neutrophils Relative %: 51 %
Platelets: 368 10*3/uL (ref 150–400)
RBC: 5.94 MIL/uL — ABNORMAL HIGH (ref 4.22–5.81)
RDW: 12.5 % (ref 11.5–15.5)
WBC: 7.3 10*3/uL (ref 4.0–10.5)
nRBC: 0 % (ref 0.0–0.2)

## 2022-03-20 LAB — URINALYSIS, ROUTINE W REFLEX MICROSCOPIC
Bilirubin Urine: NEGATIVE
Glucose, UA: >=500 mg/dL — AB
Ketones, ur: NEGATIVE mg/dL
Leukocytes,Ua: NEGATIVE
Nitrite: NEGATIVE
Protein, ur: NEGATIVE mg/dL
Specific Gravity, Urine: 1.02 (ref 1.005–1.030)
pH: 7 (ref 5.0–8.0)

## 2022-03-20 LAB — COOXEMETRY PANEL
Carboxyhemoglobin: 1.7 % — ABNORMAL HIGH (ref 0.5–1.5)
Methemoglobin: 1 % (ref 0.0–1.5)
O2 Saturation: 86.7 %
Total hemoglobin: 16.1 g/dL — ABNORMAL HIGH (ref 12.0–16.0)

## 2022-03-20 LAB — GLUCOSE, CAPILLARY
Glucose-Capillary: 171 mg/dL — ABNORMAL HIGH (ref 70–99)
Glucose-Capillary: 197 mg/dL — ABNORMAL HIGH (ref 70–99)
Glucose-Capillary: 311 mg/dL — ABNORMAL HIGH (ref 70–99)
Glucose-Capillary: 272 mg/dL — ABNORMAL HIGH (ref 70–99)
Glucose-Capillary: 192 mg/dL — ABNORMAL HIGH (ref 70–99)

## 2022-03-20 LAB — COMPREHENSIVE METABOLIC PANEL
ALT: 575 U/L — ABNORMAL HIGH (ref 0–44)
AST: 250 U/L — ABNORMAL HIGH (ref 15–41)
Albumin: 2.5 g/dL — ABNORMAL LOW (ref 3.5–5.0)
Alkaline Phosphatase: 170 U/L — ABNORMAL HIGH (ref 38–126)
Anion gap: 8 (ref 5–15)
BUN: 11 mg/dL (ref 6–20)
CO2: 26 mmol/L (ref 22–32)
Calcium: 8.5 mg/dL — ABNORMAL LOW (ref 8.9–10.3)
Chloride: 95 mmol/L — ABNORMAL LOW (ref 98–111)
Creatinine, Ser: 1.12 mg/dL (ref 0.61–1.24)
GFR, Estimated: >60 mL/min (ref >60)
Glucose, Bld: 200 mg/dL — ABNORMAL HIGH (ref 70–99)
Potassium: 4.3 mmol/L (ref 3.5–5.1)
Sodium: 129 mmol/L — ABNORMAL LOW (ref 135–145)
Total Bilirubin: 1.1 mg/dL (ref 0.3–1.2)
Total Protein: 6.7 g/dL (ref 6.5–8.1)

## 2022-03-20 LAB — HEPARIN LEVEL (UNFRACTIONATED)
Heparin Unfractionated: <0.1 IU/mL — ABNORMAL LOW (ref 0.30–0.70)
Heparin Unfractionated: 0.24 IU/mL — ABNORMAL LOW (ref 0.30–0.70)

## 2022-03-20 LAB — MAGNESIUM: Magnesium: 2.1 mg/dL (ref 1.7–2.4)

## 2022-03-20 MED ORDER — APIXABAN 5 MG PO TABS
5.0000 mg | ORAL_TABLET | Freq: Two times a day (BID) | ORAL | Status: DC
  Administered 2022-03-20 – 2022-03-21 (×3): 5 mg via ORAL
  Filled 2022-03-20 (×3): qty 1

## 2022-03-20 MED ORDER — INSULIN GLARGINE-YFGN 100 UNIT/ML ~~LOC~~ SOLN
20.0000 [IU] | Freq: Every day | SUBCUTANEOUS | Status: DC
  Administered 2022-03-21: 20 [IU] via SUBCUTANEOUS
  Filled 2022-03-20: qty 0.2

## 2022-03-20 MED ORDER — SODIUM CHLORIDE 0.9 % IV SOLN
510.0000 mg | Freq: Once | INTRAVENOUS | Status: AC
  Administered 2022-03-20: 510 mg via INTRAVENOUS
  Filled 2022-03-20: qty 17

## 2022-03-20 MED ORDER — LIVING WELL WITH DIABETES BOOK
Freq: Once | Status: AC
  Filled 2022-03-20: qty 1

## 2022-03-20 MED ORDER — INSULIN ASPART 100 UNIT/ML IJ SOLN
5.0000 [IU] | Freq: Three times a day (TID) | INTRAMUSCULAR | Status: DC
  Administered 2022-03-20 – 2022-03-21 (×3): 5 [IU] via SUBCUTANEOUS

## 2022-03-20 MED ORDER — INSULIN ASPART 100 UNIT/ML IJ SOLN
6.0000 [IU] | Freq: Three times a day (TID) | INTRAMUSCULAR | Status: DC
  Administered 2022-03-20: 6 [IU] via SUBCUTANEOUS

## 2022-03-20 NOTE — Progress Notes (Signed)
CARDIAC REHAB PHASE I  ? ?PRE:  Rate/Rhythm: 106 ST ? ?  BP: sitting 121/81 ? ?  SaO2:  ? ?MODE:  Ambulation: 550 ft  ? ?POST:  Rate/Rhythm: 110 ST ? ?  BP: sitting 129/92  ? ?  SaO2:  ? ?Tolerated well, no c/o. Sts his calf didn't hurt today. No questions from pt or girlfriend. They have been reading materials. Encouraged more walking. ?[id number]  ? ?Courteney Alderete Ethelda Chick CES, ACSM ?03/20/2022 ?3:03 PM ? ? ? ? ?

## 2022-03-20 NOTE — Progress Notes (Addendum)
? ? ?Advanced Heart Failure Rounding Note ? ? ?Subjective:   ?3/20 Milrinone cut back to 0.125 mcg.  ? ?CMRI- ?1. Severe LV dilatation with severe systolic dysfunction (EF 11%) ? 2. Moderate RV dilatation with severe systolic dysfunction (EF 19%)  ?3. Large LV thrombus adjacent to anterior/anteroseptal walls ?measuring 38mm x 25mm. Smaller LV thrombus adjacent to inferior wall ?measuring 60mm x 64mm  ?4. Hypertrabeculation of LV that meets criteria for noncompaction, ?though hypertrabeculation can also be seen in dilated ?cardiomyopathies  ?5. LGE primarily in the noncompacted regions in the basal to apical ?anterior wall, apex, and apical to mid inferior wall, but appears to ?extend into compacted subendocardium, particularly in the apical ?inferior wall and apex. Given subendocardial LGE, would recommend ?ruling out obstructive CAD, but subendocardial LGE can also be seen ?in non compaction  ?6. Moderate pericardial effusion measuring up to 58mm adjacent to LV ?inferior wall  ?7. Moderate tricuspid regurgitation (regurgitant fraction 34%)  ?8. Mild mitral regurgitation (regurgitant fraction 11%) ? ? ?Co-ox 87% on milrinone 0.125 mcg.   ? ?On heparin for LV clot and PE.  ? ? ?Complaining left calf pain when walking. Had negative lower extremity doppler 03/13/22. Denies SOB.  ? ?Objective:   ?Weight Range: 328>>310 lb ? ?Vital Signs:   ?Temp:  [97.8 ?F (36.6 ?C)-98.1 ?F (36.7 ?C)] 98.1 ?F (36.7 ?C) (03/21 0500) ?Pulse Rate:  [75-98] 90 (03/21 0500) ?Resp:  [16-20] 18 (03/21 0500) ?BP: (119-146)/(83-111) 119/83 (03/21 0500) ?SpO2:  [98 %-100 %] 100 % (03/21 0500) ?Weight:  [141 kg] 141 kg (03/21 0542) ?Last BM Date : 03/16/22 ? ?Weight change: ?Filed Weights  ? 03/18/22 0243 03/19/22 0606 03/20/22 0542  ?Weight: (!) 142.2 kg (!) 138.9 kg (!) 141 kg  ? ? ?Intake/Output:  ? ?Intake/Output Summary (Last 24 hours) at 03/20/2022 0745 ?Last data filed at 03/19/2022 2100 ?Gross per 24 hour  ?Intake 408.45 ml  ?Output 700 ml   ?Net -291.55 ml  ?  ?CVP 2  ?Physical Exam: ?General:  In bed.  No resp difficulty ?HEENT: normal ?Neck: supple. no JVD. Carotids 2+ bilat; no bruits. No lymphadenopathy or thryomegaly appreciated. ?Cor: PMI nondisplaced. Regular rate & rhythm. No rubs, gallops or murmurs. ?Lungs: clear ?Abdomen: soft, nontender, nondistended. No hepatosplenomegaly. No bruits or masses. Good bowel sounds. ?Extremities: no cyanosis, clubbing, rash, edema. RUE PICC  ?Neuro: alert & orientedx3, cranial nerves grossly intact. moves all 4 extremities w/o difficulty. Affect pleasant ? ? ?Telemetry:SR 70-80s  ? ? ?Labs: ?Basic Metabolic Panel: ?Recent Labs  ?Lab 03/15/22 ?0653 03/16/22 ?4920 03/17/22 ?0127 03/18/22 ?1007 03/19/22 ?0505 03/20/22 ?0515  ?NA 132* 128* 133* 131* 130* 129*  ?K 3.4* 3.9 3.2* 3.5 3.7 4.3  ?CL 96* 94* 95* 91* 95* 95*  ?CO2 26 24 28 31 27 26   ?GLUCOSE 140* 198* 172* 259* 240* 200*  ?BUN 14 18 15 11 9 11   ?CREATININE 1.22 1.41* 1.44* 1.28* 1.15 1.12  ?CALCIUM 8.4* 8.4* 8.4* 8.1* 8.4* 8.5*  ?MG 1.8  --  1.7 1.6* 4.4* 2.1  ?PHOS 4.5  --   --   --   --   --   ? ? ?Liver Function Tests: ?Recent Labs  ?Lab 03/16/22 ? 03/17/22 ?0127 03/18/22 ?03/19/22 03/19/22 ?0505 03/20/22 ?0515  ?AST 3,970* 3,128* 1,201* 524* 250*  ?ALT 1,733* 1,663* 1,154* 856* 575*  ?ALKPHOS 124 136* 150* 191* 170*  ?BILITOT 2.1* 1.6* 1.5* 1.1 1.1  ?PROT 6.4* 6.4* 6.5 6.9 6.7  ?ALBUMIN 2.9* 2.7* 2.5*  2.5* 2.5*  ? ?Recent Labs  ?Lab 03/13/22 ?1530  ?LIPASE 25  ? ?No results for input(s): AMMONIA in the last 168 hours. ? ?CBC: ?Recent Labs  ?Lab 03/16/22 ?3545 03/17/22 ?0127 03/18/22 ?6256 03/19/22 ?0505 03/20/22 ?0515  ?WBC 12.3* 8.1 7.4 7.5 7.3  ?NEUTROABS  --  4.7 4.1 4.1 3.7  ?HGB 13.8 14.3 14.4 16.7 17.3*  ?HCT 42.0 41.7 41.8 50.0 51.9  ?MCV 87.1 85.8 85.8 87.3 87.4  ?PLT 332 329 366 401* 368  ? ? ?Cardiac Enzymes: ?No results for input(s): CKTOTAL, CKMB, CKMBINDEX, TROPONINI in the last 168 hours. ? ?BNP: ?BNP (last 3 results) ?Recent Labs  ?   06/27/21 ?1855 09/06/21 ?1157 03/13/22 ?1530  ?BNP 740.6* 367.6* 1,083.1*  ? ? ?ProBNP (last 3 results) ?No results for input(s): PROBNP in the last 8760 hours. ? ? ? ?Other results: ? ?Imaging: ?MR CARDIAC MORPHOLOGY W WO CONTRAST ? ?Result Date: 03/20/2022 ?CLINICAL DATA:  3M with HFrEF, CVA, PE. Echo 03/14/22 with large LV thrombus, EF <20%, severe RV dysfunction EXAM: CARDIAC MRI TECHNIQUE: The patient was scanned on a 1.5 Tesla Siemens magnet. A dedicated cardiac coil was used. Functional imaging was done using Fiesta sequences. 2,3, and 4 chamber views were done to assess for RWMA's. Modified Simpson's rule using a short axis stack was used to calculate an ejection fraction on a dedicated work [employer]. The patient received 10 cc of Gadavist. After 10 minutes inversion recovery sequences were used to assess for infiltration and scar tissue. CONTRAST:  10 cc  of Gadavist FINDINGS: Left ventricle: -LV hypertrabeculation -Severely dilated -Severe systolic dysfunction -Large LV thrombus adjacent to anteroseptum measuring 12mm x 67mm. Smaller LV thrombus adjacent to inferior wall measuring 22mm x 64mm -Nonspecific ECV elevation (33%) -Normal T2 -LGE primarily in the noncompacted regions in the basal to apical anterior wall, apex, and apical to mid inferior wall, but appears to extend into compacted subendocardium, particularly in the apical inferior wall and apex. Subendodardial LGE suggests infarct, and would recommend ruling out obstructive CAD, but can also be seen in LV noncompaction cardiomyopathy LV EF: 11% (Normal 56-78%) Absolute volumes: LV EDV: (Normal 77-195 mL) LV ESV: (Normal 19-72 mL) LV SV: 55mL (Normal 51-133 mL) CO: 4.3L/min (Normal 2.8-8.8 L/min) Indexed volumes: LV EDV: 150mL/sq-m (Normal 47-92 mL/sq-m) LV ESV: 153mL/sq-m (Normal 13-30 mL/sq-m) LV SV: 23mL/sq-m (Normal 32-62 mL/sq-m) CI: 1.6L/min/sq-m (Normal 1.7-4.2 L/min/sq-m) Right ventricle: Moderate dilatation  with severe systolic dysfunction RV EF:  19% (Normal 47-74%) Absolute volumes: RV EDV: (Normal 88-227 mL) RV ESV: (Normal 23-103 mL) RV SV: 22mL (Normal 52-138 mL) CO: 5.9L/min (Normal 2.8-8.8 L/min) Indexed volumes: RV EDV: 178mL/sq-m (Normal 55-105 mL/sq-m) RV ESV: 161mL/sq-m (Normal 15-43 mL/sq-m) RV SV: 53mL/sq-m (Normal 32-64 mL/sq-m) CI: 2.2L/min/sq-m (Normal 1.7-4.2 L/min/sq-m) Left atrium: Moderate enlargement Right atrium: Normal size Mitral valve: Mild regurgitation (regurgitant fraction 11%) Aortic valve: Trivial regurgitation Tricuspid valve: Moderate regurgitation (regurgitant fraction 34%) Pulmonic valve: Trivial regurgitation Aorta: Normal proximal ascending aorta Pericardium: Moderate effusion measuring up to 44mm adjacent to LV inferior wall IMPRESSION: 1. Severe LV dilatation with severe systolic dysfunction (EF 11%) 2. Moderate RV dilatation with severe systolic dysfunction (EF 19%) 3. Large LV thrombus adjacent to anterior/anteroseptal walls measuring 3mm x 71mm. Smaller LV thrombus adjacent to inferior wall measuring 46mm x 28mm 4. Hypertrabeculation of LV that meets criteria for noncompaction, though hypertrabeculation can also be seen in dilated cardiomyopathies 5. LGE primarily in the noncompacted regions in the basal  to apical anterior wall, apex, and apical to mid inferior wall, but appears to extend into compacted subendocardium, particularly in the apical inferior wall and apex. Given subendocardial LGE, would recommend ruling out obstructive CAD, but subendocardial LGE can also be seen in noncompaction 6. Moderate pericardial effusion measuring up to 35mm adjacent to LV inferior wall 7. Moderate tricuspid regurgitation (regurgitant fraction 34%) 8. Mild mitral regurgitation (regurgitant fraction 11%) Electronically Signed   By: Epifanio Lesches M.D.   On: 03/20/2022 01:10   ? ? ?Medications:   ? ? ?Scheduled Medications: ? Chlorhexidine Gluconate Cloth  6 each Topical  Daily  ? dapagliflozin propanediol  10 mg Oral Daily  ? digoxin  0.125 mg Oral Daily  ? insulin aspart  0-20 Units Subcutaneous TID WC  ? insulin aspart  0-5 Units Subcutaneous QHS  ? insulin aspart  4 Uni

## 2022-03-20 NOTE — TOC Initial Note (Addendum)
Transition of Care (TOC) - Initial/Assessment Note  ? ? ?Patient Details  ?Name: Robert Mccann ?MRN: [id number] ?Date of Birth: 05-01-1996 ? ?[facility]) CM/SW Contact:    ?Mariea Stable Davene Costain, RN ?Phone Number: [phone] ?03/20/2022, 5:02 PM ? ?Clinical Narrative:                 ?HF TOC CM spoke to pt and SO at bedside. Pt states he recently started a new job and has not completed probation period. He does not have FMLA or disability benefits. States he physically may not be able to return to job due to heavy lifting. Wanted information on how to apply for disability, food stamps and Medicaid. Pt currently has insurance with [employer]. States it was free first month but not sure what his copay will be each month. Will request meds come up from [facility] pharmacy at dc.  ? ?Expected Discharge Plan: Home/Self Care ?Barriers to Discharge: Continued Medical Work up ? ? ?Patient Goals and CMS Choice ?  ?CMS Medicare.gov Compare Post Acute Care list provided to:: Patient ?  ? ?Expected Discharge Plan and Services ?Expected Discharge Plan: Home/Self Care ?  ?Discharge Planning Services: CM Consult, Medication Assistance ?  ?Living arrangements for the past 2 months: Single Family Home ?                ?  ?  ?  ?  ?  ?  ?  ?  ?  ?  ? ?Prior Living Arrangements/Services ?Living arrangements for the past 2 months: Single Family Home ?Lives with:: Significant Other ?  ?       ?Need for Family Participation in Patient Care: No (Comment) ?Care giver support system in place?: No (comment) ?  ?Criminal Activity/Legal Involvement Pertinent to Current Situation/Hospitalization: No - Comment as needed ? ?Activities of Daily Living ?Home Assistive Devices/Equipment: None ?ADL Screening (condition at time of admission) ?Patient's cognitive ability adequate to safely complete daily activities?: Yes ?Is the patient deaf or have difficulty hearing?: No ?Does the patient have difficulty seeing, even when wearing  glasses/contacts?: No ?Does the patient have difficulty concentrating, remembering, or making decisions?: No ?Patient able to express need for assistance with ADLs?: Yes ?Does the patient have difficulty dressing or bathing?: Yes ?Independently performs ADLs?: Yes (appropriate for developmental age) ?Does the patient have difficulty walking or climbing stairs?: Yes ?Weakness of Legs: None ?Weakness of Arms/Hands: None ? ?Permission Sought/Granted ?Permission sought to share information with : Case Manager, Family Supports, PCP ?Permission granted to share information with : Yes, Verbal Permission Granted ? Share Information with NAME: Dontrae Morini ?   ? Permission granted to share info w Relationship: mother ? Permission granted to share info w Contact Information: [phone] ? ?Emotional Assessment ?Appearance:: Appears stated age ?Attitude/Demeanor/Rapport: Engaged ?Affect (typically observed): Accepting ?Orientation: : Oriented to Self, Oriented to Place, Oriented to  Time, Oriented to Situation ?  ?Psych Involvement: No (comment) ? ?Admission diagnosis:  Acute on chronic systolic (congestive) heart failure (HCC) [I50.23] ?Patient Active Problem List  ? Diagnosis Date Noted  ? Acute on chronic systolic (congestive) heart failure (HCC) 03/16/2022  ? Acute on chronic HFrEF (heart failure with reduced ejection fraction) (HCC)   ? LV (left ventricular) mural thrombus   ? NSTEMI (non-ST elevated myocardial infarction) (HCC) 03/13/2022  ? Diabetes mellitus type 2, noninsulin dependent (HCC) 03/13/2022  ? Hyperlipidemia 03/13/2022  ? Acute pulmonary embolism (HCC) 03/13/2022  ? Cerebrovascular accident (CVA) (  HCC) 11/05/2021  ? PE (pulmonary thromboembolism) (HCC) 06/28/2021  ? HFrEF (heart failure with reduced ejection fraction) (HCC)   ? Acute pulmonary embolism with acute cor pulmonale (HCC)   ? Obesity, Class III, BMI 40-49.9 (morbid obesity) (HCC)   ? DM II (diabetes mellitus, type II), controlled (HCC)  06/27/2021  ? Chest pain 06/27/2021  ? HTN (hypertension) 06/27/2021  ? Malaise 05/25/2014  ? Routine history and physical examination of adult 05/25/2014  ? Obesity 05/25/2014  ? ?PCP:  Nira Retort ?Pharmacy:   ?[id number] - [address], [place] - [address] ?[address] ?[place] [address] ?Phone: [phone] Fax: [phone] ? ?[facility] ?[address] ?[place] [address] ?Phone: [phone] Fax: [phone] ? ? ? ? ?Social Determinants of Health (SDOH) Interventions ?  ? ?Readmission Risk Interventions ?No flowsheet data found. ? ? ?

## 2022-03-20 NOTE — Progress Notes (Signed)
Inpatient Diabetes Program Recommendations ? ?AACE/ADA: New Consensus Statement on Inpatient Glycemic Control (2015) ? ?Target Ranges:  Prepandial:   less than 140 mg/dL ?     Peak postprandial:   less than 180 mg/dL (1-2 hours) ?     Critically ill patients:  140 - 180 mg/dL  ? ?Lab Results  ?Component Value Date  ? GLUCAP 197 (H) 03/20/2022  ? HGBA1C 8.9 (H) 03/13/2022  ? ? ?Review of Glycemic Control ? Latest Reference Range & Units 03/18/22 21:42 03/19/22 06:39 03/19/22 11:50 03/19/22 16:08 03/19/22 21:35 03/20/22 06:31 03/20/22 10:53  ?Glucose-Capillary 70 - 99 mg/dL 285 (H) 204 (H) 259 (H) 169 (H) 227 (H) 171 (H) 197 (H)  ? ?Diabetes history: DM 2 ?Outpatient Diabetes medications:  ?Metformin ?Current orders for Inpatient glycemic control:  ?Novolog resistant tid with meals and HS ?Novolog 5 units tid with meals ?Semglee 20 units daily ?Inpatient Diabetes Program Recommendations:   ? ?Spoke with patient at bedside.  Reviewed basic information regarding DM, basic diet information, and monitoring.  We discussed Wilder Glade and how it works for DM and heart failure.  He has lots of questions regarding diet.  We discussed how CHO increase blood sugars.  He reports recent reduction in appetite. Will order nutrition consult as well.  Discussed glucose goals and the importance of close f/u.  I agree that a GLP-1 may be helpful as well.  ? ?Thanks,  ?Adah Perl, RN, BC-ADM ?Inpatient Diabetes Coordinator ?Pager [phone]  (8a-5p) ? ? ?

## 2022-03-20 NOTE — Progress Notes (Signed)
Patient called RN to room this morning, pt had urinated in the toilet and had small amount of bright red blood in urine. Patient took photo with his phone also to show the MD this morning.  ?

## 2022-03-20 NOTE — Progress Notes (Signed)
ANTICOAGULATION CONSULT NOTE ? ?Pharmacy Consult for heparin>> apixiban ?Indication:  bilateral PE and newly diagnosed apical mural thrombus ? ?No Known Allergies ? ?Patient Measurements: ?Height: 6' (182.9 cm) ?Weight: (!) 141 kg (310 lb 13.6 oz) ?IBW/kg (Calculated) : 77.6 ?Heparin Dosing Weight: 112.1 kg ? ?Vital Signs: ?Temp: 98.6 ?F (37 ?C) (03/21 1055) ?Temp Source: Oral (03/21 1055) ?BP: 108/73 (03/21 1055) ?Pulse Rate: 90 (03/21 0500) ? ?Labs: ?Recent Labs  ?  03/18/22 ?0623 03/18/22 ?1400 03/19/22 ?0505 03/20/22 ?7628 03/20/22 ?3151  ?HGB 14.4  --  16.7 17.3*  --   ?HCT 41.8  --  50.0 51.9  --   ?PLT 366  --  401* 368  --   ?HEPARINUNFRC 0.69   < > 0.62 <0.10* 0.24*  ?CREATININE 1.28*  --  1.15 1.12  --   ? < > = values in this interval not displayed.  ? ? ? ?Estimated Creatinine Clearance: 144.3 mL/min (by C-G formula based on SCr of 1.12 mg/dL). ? ? ?Medical History: ?Past Medical History:  ?Diagnosis Date  ? 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 a. 05/2021 noted during periods of sleep @ time of hospitalization for PE.  ? Cardiomyopathy (HCC)   ? a. 05/2021 Echo: EF <20%, glob HK. [place]:C ratio of 2.1:1 suggesting non-compaction. Gr2 DD. Sev reduced RV fxn. Nl PASP. Mildly dil RA. Mild MR; b. 10/2021 Echo: EF <20%, GrII DD; c. 10/2021 TEE: EF 20-25%, glob HK, sev red RV fxn.  ? Diabetes mellitus without complication (HCC)   ? HFrEF (heart failure with reduced ejection fraction) (HCC)   ? a. 05/2021 Echo: EF <20%. ? non-compaction.  ? Hypertension   ? Pulmonary embolism (HCC)   ? a. 05/2021 CTA Chest: PE extending from lobar arteris of RUL and RLL w/ concern for pulml infarct-->s/p thrombolysis and thrombectomy of RUL/RML/RLL.  ? Stroke [facility])   ? a. 10/2021 CT Head: Bilat cerebral infarcts w/ mod-sided acute to subacute R temporoparietal infarct; b. 10/2021 MRI: R post PCA territory infarcts w/o hemorrhage; c. 10/2021 TEE: Neg bubble study. No LA/LAA thrombus. EF 20-25%, glob HK.  ? ? ?Medications:  ?Medications Prior to  Admission  ?Medication Sig Dispense Refill Last Dose  ? carvedilol (COREG) 3.125 MG tablet Take 1 tablet (3.125 mg total) by mouth 2 (two) times daily with a meal. 60 tablet 1 03/16/2022 at 0800  ? furosemide (LASIX) 10 MG/ML injection Inject 4 mLs (40 mg total) into the vein daily. 4 mL 0 03/16/2022  ? heparin 76160 UT/250ML infusion Inject 2,300 Units/hr into the vein continuous. 250 mL 0 03/16/2022  ? ?Scheduled:  ? apixaban  5 mg Oral BID  ? Chlorhexidine Gluconate Cloth  6 each Topical Daily  ? dapagliflozin propanediol  10 mg Oral Daily  ? digoxin  0.125 mg Oral Daily  ? insulin aspart  0-20 Units Subcutaneous TID WC  ? insulin aspart  0-5 Units Subcutaneous QHS  ? insulin aspart  6 Units Subcutaneous TID WC  ? insulin glargine-yfgn  15 Units Subcutaneous Daily  ? sacubitril-valsartan  1 tablet Oral BID  ? sodium chloride flush  10-40 mL Intracatheter Q12H  ? sodium chloride flush  3 mL Intravenous Q12H  ? spironolactone  25 mg Oral Daily  ? ?Infusions:  ? sodium chloride    ? ?PRN: sodium chloride, acetaminophen, melatonin, ondansetron (ZOFRAN) IV, sodium chloride flush, sodium chloride flush ?Anti-infectives (From admission, onward)  ? ? None  ? ?  ? ? ?Assessment: ?26 y.o. male,  history of type 2 diabetes, hypertension, HFrEF, PE, obesity, CVA, presents to the emergency department for evaluation of emesis and palpitations x1 month. Found to have new pulmonary embolism in the segmental left upper lobe pulmonary artery, as well as a right atrial thrombus and LV thrombus. Pharmacy consulted for management of heparin drip in the setting of acute pulmonary embolism. ?  ?Heparin switched to apixiban 5 mg BID. No 10 mg BID load given 7 days of IV heparin therapy. CBC stable, no s/x of bleeding per RN.  ? ?Goal of Therapy:  ?Heparin level 0.3-0.7 ?Monitor platelets by anticoagulation protocol: Yes ?  ?Plan: ?Stop heparin when first dose of apixiban is given ?Start apixiban 5 mg BID ? ?Drake Leach, PharmD ?PGY2  Cardiology Pharmacy Resident ?Phone: [phone] ?03/20/2022  12:54 PM ? ?Please check [email or web address] for unit-specific pharmacy phone numbers. ? ? ?

## 2022-03-20 NOTE — Progress Notes (Signed)
?PROGRESS NOTE ? ? ? ?Robert Mccann  [id number] DOB: 1996/06/06 DOA: 03/16/2022 ?PCP: Ellene Route  ?Narrative: 26 y.o. male chronically ill with chronic systolic CHF, EF [id number], CVA, PE s/p thrombectomy, DM2, HTN, and obesity , was admitted to [facility] 3/14 with dyspnea fatigue, chest discomfort and emesis, reported noncompliance with Eliquis X 2 weeks, CTA chest noted new PE. ?-Echo noted LV thrombus, concerns for noncompaction cardiomyopathy, EF<20%, global hypokinesis of LV, grade 2 DD, severe RV dysfunction ?-He was started on IV heparin, IV Lasix, care limited by soft BPs, subsequently transferred to [facility] for advanced heart failure team evaluation and management. ?-Started on milrinone, improving clinically ? ? ?Subjective:  ? ? ?Assessment & Plan: ? ?Acute on chronic systolic, biventricular CHF ?Cardiogenic shock ?Low output state ?-Echo noted LV thrombus, concerns for noncompaction cardiomyopathy, EF<20%, global hypokinesis of LV, grade 2 DD, severe RV dysfunction ?-Started on milrinone 3/17 ?-Diuresed with IV Lasix, also on Aldactone, Farxiga, digoxin,, he is 13.1 L ?-Off Lasix now, milrinone stopped today ?-Per heart failure team ?-DC planning, needs quick FU ?  ?LV thrombus: ?-History of noncompliance with Eliquis X 2 weeks PTA ?-On IV heparin ?-Likely not a good candidate for Coumadin therapy on account of poor compliance ?-back on eliquis today ?-had long d/w pt regarding high stroke risk and importance of compliance ?  ?Recurrent pulmonary embolism: ?-Due to noncompliance as noted above, see above ? ?Mild AKI ?-Secondary to cardiorenal syndrome, now on milrinone, improving ? ?Shock liver ?-abnormal LFTs, likely secondary to shock liver and passive hepatic congestion ?-LFTs were normal prior to that, had a ultrasound at [facility] 3/17 which noted hepatic steatosis, no acute findings ?-Improving ? ?Mildly elevated TSH ?-Free T4 is mildly elevated ?-This is likely erroneous, cannot be correlated-on  further discussion patient does report multivitamin use in the past week, could be affecting T4 levels ?-Recommend repeat TSH/T4 in 6 to 8 weeks off multivitamins/biotin ?  ?Essential hypertension: ?-Stable, currently on milrinone, Coreg discontinued ?  ?Hyperlipidemia: ?Continue atorvastatin 80 mg daily ?  ?Type 2 diabetes ?-CBGs still elevated,  hemoglobin A1c is 8.9 ?-increase glargine and meal coverage novolog ?-at Dc resume MEtformin and continue [place] ? ?DVT prophylaxis: Heparin GTT> Apixaban ?Code Status: Full code ? ?Procedures:  ? ?Antimicrobials:  ? ? ?Objective: ?Vitals:  ? 03/19/22 2357 03/20/22 0500 03/20/22 0542 03/20/22 [id number]  ?BP: 122/86 119/83  118/76  ?Pulse: 75 90    ?Resp: 16 18    ?Temp: 98 ?F (36.7 ?C) 98.1 ?F (36.7 ?C)  98.1 ?F (36.7 ?C)  ?TempSrc: Oral Oral  Oral  ?SpO2: 99% 100%  98%  ?Weight:   (!) 141 kg   ?Height:      ? ? ?Intake/Output Summary (Last 24 hours) at 03/20/2022 1011 ?Last data filed at 03/20/2022 0901 ?Gross per 24 hour  ?Intake 768.45 ml  ?Output 700 ml  ?Net 68.45 ml  ? ?Filed Weights  ? 03/18/22 0243 03/19/22 0606 03/20/22 0542  ?Weight: (!) 142.2 kg (!) 138.9 kg (!) 141 kg  ? ? ?Examination: ? ?Gen: morbidly obese pleasant male, Awake, Alert, Oriented X 3,  ?HEENT: no JVD ?Lungs: clear, distant breath sounds ?CVS: S1S2/RRR ?Abd: soft, Non tender, non distended, BS present ?Extremities: No edema, right upper extremity PICC line ?Neuro: Moves all extremities, no localizing signs ? ?Data Reviewed:  ? ?CBC: ?Recent Labs  ?Lab 03/16/22 ?[id number] 03/17/22 ?0127 03/18/22 ?[id number] 03/19/22 ?0505 03/20/22 ?0515  ?WBC 12.3* 8.1 7.4 7.5 7.3  ?NEUTROABS  --  4.7  4.1 4.1 3.7  ?HGB 13.8 14.3 14.4 16.7 17.3*  ?HCT 42.0 41.7 41.8 50.0 51.9  ?MCV 87.1 85.8 85.8 87.3 87.4  ?PLT 332 329 366 401* 368  ? ?Basic Metabolic Panel: ?Recent Labs  ?Lab 03/15/22 ?0653 03/16/22 ?[id number] 03/17/22 ?0127 03/18/22 ?[id number] 03/19/22 ?0505 03/20/22 ?0515  ?NA 132* 128* 133* 131* 130* 129*  ?K 3.4* 3.9 3.2* 3.5 3.7 4.3   ?CL 96* 94* 95* 91* 95* 95*  ?CO2 26 24 28 31 27 26   ?GLUCOSE 140* 198* 172* 259* 240* 200*  ?BUN 14 18 15 11 9 11   ?CREATININE 1.22 1.41* 1.44* 1.28* 1.15 1.12  ?CALCIUM 8.4* 8.4* 8.4* 8.1* 8.4* 8.5*  ?MG 1.8  --  1.7 1.6* 4.4* 2.1  ?PHOS 4.5  --   --   --   --   --   ? ?GFR: ?Estimated Creatinine Clearance: 144.3 mL/min (by C-G formula based on SCr of 1.12 mg/dL). ?Liver Function Tests: ?Recent Labs  ?Lab 03/16/22 ?[id number] 03/17/22 ?0127 03/18/22 ?[id number] 03/19/22 ?0505 03/20/22 ?0515  ?AST 3,970* 3,128* 1,201* 524* 250*  ?ALT 1,733* 1,663* 1,154* 856* 575*  ?ALKPHOS 124 136* 150* 191* 170*  ?BILITOT 2.1* 1.6* 1.5* 1.1 1.1  ?PROT 6.4* 6.4* 6.5 6.9 6.7  ?ALBUMIN 2.9* 2.7* 2.5* 2.5* 2.5*  ? ?Recent Labs  ?Lab 03/13/22 ?1530  ?LIPASE 25  ? ?No results for input(s): AMMONIA in the last 168 hours. ?Coagulation Profile: ?Recent Labs  ?Lab 03/13/22 ?1530 03/16/22 ?[id number] 03/17/22 ?0127  ?INR 1.3* 1.8* 1.9*  ? ?Cardiac Enzymes: ?No results for input(s): CKTOTAL, CKMB, CKMBINDEX, TROPONINI in the last 168 hours. ?BNP (last 3 results) ?No results for input(s): PROBNP in the last 8760 hours. ?HbA1C: ?No results for input(s): HGBA1C in the last 72 hours. ?CBG: ?Recent Labs  ?Lab 03/19/22 ?0639 03/19/22 ?1150 03/19/22 ?1608 03/19/22 ?2135 03/20/22 ?0631  ?[place] ?Lipid Profile: ?No results for input(s): CHOL, HDL, LDLCALC, TRIG, CHOLHDL, LDLDIRECT in the last 72 hours. ?Thyroid Function Tests: ?Recent Labs  ?  03/18/22 ?[id number]  ?FREET4 1.56*  ? ?Anemia Panel: ?Recent Labs  ?  03/19/22 ?0505  ?[id number] 1,907*  ?FOLATE 15.4  ?FERRITIN 95  ?TIBC 448  ?IRON 37*  ?RETICCTPCT 1.9  ? ?Urine analysis: ?   ?Component Value Date/Time  ? COLORURINE YELLOW (A) 11/05/2021 2056  ? APPEARANCEUR CLEAR (A) 11/05/2021 2056  ? LABSPEC 1.030 11/05/2021 2056  ? PHURINE 5.0 11/05/2021 2056  ? GLUCOSEU >=500 (A) 11/05/2021 2056  ? [place] NEGATIVE 11/05/2021 2056  ? [place] NEGATIVE 11/05/2021 2056  ? [place] NEGATIVE  11/05/2021 2056  ? PROTEINUR 30 (A) 11/05/2021 2056  ? NITRITE NEGATIVE 11/05/2021 2056  ? LEUKOCYTESUR NEGATIVE 11/05/2021 2056  ? ?Sepsis Labs: ?@LABRCNTIP (procalcitonin:4,lacticidven:4) ? ?) ?Recent Results (from the past 240 hour(s))  ?Resp Panel by RT-PCR (Flu A&B, Covid) Nasopharyngeal Swab     Status: None  ? Collection Time: 03/14/22  7:59 AM  ? Specimen: Nasopharyngeal Swab; Nasopharyngeal(NP) swabs in vial transport medium  ?Result Value Ref Range Status  ? SARS Coronavirus 2 by RT PCR NEGATIVE NEGATIVE Final  ?  Comment: (NOTE) ?SARS-CoV-2 target nucleic acids are NOT DETECTED. ? ?The SARS-CoV-2 RNA is generally detectable in upper respiratory ?specimens during the acute phase of infection. The lowest ?concentration of SARS-CoV-2 viral copies this assay can detect is ?138 copies/mL. A negative result does not preclude SARS-Cov-2 ?infection and should not be used as the sole basis for treatment or ?other patient management decisions.  A negative result may occur with  ?improper specimen collection/handling, submission of specimen other ?than nasopharyngeal swab, presence of viral mutation(s) within the ?areas targeted by this assay, and inadequate number of viral ?copies(<138 copies/mL). A negative result must be combined with ?clinical observations, patient history, and epidemiological ?information. The expected result is Negative. ? ?Fact Sheet for Patients:  ?EntrepreneurPulse.com.au ? ?Fact Sheet for Healthcare Providers:  ?IncredibleEmployment.be ? ?This test is no t yet approved or cleared by the [place] FDA and  ?has been authorized for detection and/or diagnosis of SARS-CoV-2 by ?FDA under an Emergency Use Authorization (EUA). This EUA will remain  ?in effect (meaning this test can be used) for the duration of the ?COVID-19 declaration under Section 564(b)(1) of the Act, 21 ?U.S.C.section 360bbb-3(b)(1), unless the authorization is terminated  ?or revoked  sooner.  ? ? ?  ? Influenza A by PCR NEGATIVE NEGATIVE Final  ? Influenza B by PCR NEGATIVE NEGATIVE Final  ?  Comment: (NOTE) ?The Xpert Xpress SARS-CoV-2/FLU/RSV plus assay is intended as an aid ?in the diagnosi

## 2022-03-20 NOTE — Social Work (Signed)
CSW emailed financial counseling to inquire about pt getting disability benefits started while he is inpatient.  ?

## 2022-03-21 ENCOUNTER — Other Ambulatory Visit (HOSPITAL_COMMUNITY): Payer: Self-pay

## 2022-03-21 DIAGNOSIS — I10 Essential (primary) hypertension: Secondary | ICD-10-CM | POA: Diagnosis present

## 2022-03-21 DIAGNOSIS — E1169 Type 2 diabetes mellitus with other specified complication: Secondary | ICD-10-CM | POA: Diagnosis present

## 2022-03-21 DIAGNOSIS — E785 Hyperlipidemia, unspecified: Secondary | ICD-10-CM

## 2022-03-21 DIAGNOSIS — D509 Iron deficiency anemia, unspecified: Secondary | ICD-10-CM | POA: Diagnosis present

## 2022-03-21 DIAGNOSIS — N179 Acute kidney failure, unspecified: Secondary | ICD-10-CM | POA: Diagnosis present

## 2022-03-21 DIAGNOSIS — E611 Iron deficiency: Secondary | ICD-10-CM | POA: Diagnosis present

## 2022-03-21 DIAGNOSIS — K7200 Acute and subacute hepatic failure without coma: Secondary | ICD-10-CM | POA: Diagnosis present

## 2022-03-21 DIAGNOSIS — I2609 Other pulmonary embolism with acute cor pulmonale: Secondary | ICD-10-CM

## 2022-03-21 LAB — CBC WITH DIFFERENTIAL/PLATELET
Abs Immature Granulocytes: 0 10*3/uL (ref 0.00–0.07)
Basophils Absolute: 0 10*3/uL (ref 0.0–0.1)
Basophils Relative: 0 %
Eosinophils Absolute: 0 10*3/uL (ref 0.0–0.5)
Eosinophils Relative: 0 %
HCT: 51 % (ref 39.0–52.0)
Hemoglobin: 16.2 g/dL (ref 13.0–17.0)
Lymphocytes Relative: 31 %
Lymphs Abs: 2.5 10*3/uL (ref 0.7–4.0)
MCH: 28.2 pg (ref 26.0–34.0)
MCHC: 31.8 g/dL (ref 30.0–36.0)
MCV: 88.7 fL (ref 80.0–100.0)
Monocytes Absolute: 0.5 10*3/uL (ref 0.1–1.0)
Monocytes Relative: 6 %
Neutro Abs: 5 10*3/uL (ref 1.7–7.7)
Neutrophils Relative %: 63 %
Platelets: 380 10*3/uL (ref 150–400)
RBC: 5.75 MIL/uL (ref 4.22–5.81)
RDW: 12.7 % (ref 11.5–15.5)
WBC: 8 10*3/uL (ref 4.0–10.5)
nRBC: 0 % (ref 0.0–0.2)
nRBC: 0 /100 WBC

## 2022-03-21 LAB — HEPATIC FUNCTION PANEL
ALT: 441 U/L — ABNORMAL HIGH (ref 0–44)
AST: 151 U/L — ABNORMAL HIGH (ref 15–41)
Albumin: 2.6 g/dL — ABNORMAL LOW (ref 3.5–5.0)
Alkaline Phosphatase: 163 U/L — ABNORMAL HIGH (ref 38–126)
Bilirubin, Direct: 0.3 mg/dL — ABNORMAL HIGH (ref 0.0–0.2)
Indirect Bilirubin: 0.8 mg/dL (ref 0.3–0.9)
Total Bilirubin: 1.1 mg/dL (ref 0.3–1.2)
Total Protein: 6.8 g/dL (ref 6.5–8.1)

## 2022-03-21 LAB — BASIC METABOLIC PANEL
Anion gap: 10 (ref 5–15)
BUN: 10 mg/dL (ref 6–20)
CO2: 22 mmol/L (ref 22–32)
Calcium: 9 mg/dL (ref 8.9–10.3)
Chloride: 98 mmol/L (ref 98–111)
Creatinine, Ser: 1.01 mg/dL (ref 0.61–1.24)
GFR, Estimated: >60 mL/min (ref >60)
Glucose, Bld: 148 mg/dL — ABNORMAL HIGH (ref 70–99)
Potassium: 4.9 mmol/L (ref 3.5–5.1)
Sodium: 130 mmol/L — ABNORMAL LOW (ref 135–145)

## 2022-03-21 LAB — GLUCOSE, CAPILLARY
Glucose-Capillary: 165 mg/dL — ABNORMAL HIGH (ref 70–99)
Glucose-Capillary: 266 mg/dL — ABNORMAL HIGH (ref 70–99)

## 2022-03-21 LAB — COOXEMETRY PANEL
Carboxyhemoglobin: 1.6 % — ABNORMAL HIGH (ref 0.5–1.5)
Methemoglobin: 0.8 % (ref 0.0–1.5)
O2 Saturation: 68.4 %
Total hemoglobin: 17.3 g/dL — ABNORMAL HIGH (ref 12.0–16.0)

## 2022-03-21 LAB — MAGNESIUM: Magnesium: 2.2 mg/dL (ref 1.7–2.4)

## 2022-03-21 LAB — DIGOXIN LEVEL: Digoxin Level: <0.2 ng/mL — ABNORMAL LOW (ref 0.8–2.0)

## 2022-03-21 MED ORDER — SACUBITRIL-VALSARTAN 24-26 MG PO TABS
1.0000 | ORAL_TABLET | Freq: Two times a day (BID) | ORAL | 0 refills | Status: DC
Start: 2022-03-21 — End: 2022-03-28
  Filled 2022-03-21: qty 60, 30d supply, fill #0

## 2022-03-21 MED ORDER — OZEMPIC (0.25 OR 0.5 MG/DOSE) 2 MG/1.5ML ~~LOC~~ SOPN
0.2500 mg | PEN_INJECTOR | SUBCUTANEOUS | 0 refills | Status: DC
  Filled 2022-03-21: qty 1.5, 56d supply, fill #0

## 2022-03-21 MED ORDER — BLOOD GLUCOSE MONITOR SYSTEM W/DEVICE KIT: PACK | 3 refills | Status: AC

## 2022-03-21 MED ORDER — METOPROLOL SUCCINATE ER 25 MG PO TB24
12.5000 mg | ORAL_TABLET | Freq: Every day | ORAL | Status: DC
Start: 2022-03-21 — End: 2022-03-21

## 2022-03-21 MED ORDER — POTASSIUM CHLORIDE CRYS ER 20 MEQ PO TBCR: 40.0000 meq | EXTENDED_RELEASE_TABLET | Freq: Every day | ORAL | Status: DC | PRN

## 2022-03-21 MED ORDER — SEMAGLUTIDE 3 MG PO TABS
3.0000 mg | ORAL_TABLET | Freq: Every day | ORAL | 0 refills | Status: DC
  Filled 2022-03-21: qty 30, 30d supply, fill #0

## 2022-03-21 MED ORDER — CONTOUR NEXT ONE DEVI
0 refills | Status: DC
  Filled 2022-03-21 (×2): qty 1, 30d supply, fill #0

## 2022-03-21 MED ORDER — METFORMIN HCL 500 MG PO TABS
500.0000 mg | ORAL_TABLET | Freq: Two times a day (BID) | ORAL | 0 refills | Status: DC
Start: 2022-03-22 — End: 2022-10-23
  Filled 2022-03-21: qty 60, 30d supply, fill #0

## 2022-03-21 MED ORDER — DIGOXIN 125 MCG PO TABS
0.1250 mg | ORAL_TABLET | Freq: Every day | ORAL | 0 refills | Status: DC
  Filled 2022-03-21: qty 30, 30d supply, fill #0

## 2022-03-21 MED ORDER — APIXABAN 5 MG PO TABS
5.0000 mg | ORAL_TABLET | Freq: Two times a day (BID) | ORAL | 0 refills | Status: DC
Start: 2022-03-21 — End: 2022-04-25
  Filled 2022-03-21: qty 60, 30d supply, fill #0

## 2022-03-21 MED ORDER — FUROSEMIDE 40 MG PO TABS
40.0000 mg | ORAL_TABLET | Freq: Every day | ORAL | 0 refills | Status: DC | PRN
  Filled 2022-03-21: qty 30, 30d supply, fill #0

## 2022-03-21 MED ORDER — OZEMPIC (0.25 OR 0.5 MG/DOSE) 2 MG/1.5ML ~~LOC~~ SOPN
0.5000 mg | PEN_INJECTOR | SUBCUTANEOUS | 0 refills | Status: DC
  Filled 2022-03-21: qty 1.5, 28d supply, fill #0

## 2022-03-21 MED ORDER — DAPAGLIFLOZIN PROPANEDIOL 10 MG PO TABS
10.0000 mg | ORAL_TABLET | Freq: Every day | ORAL | 0 refills | Status: AC
  Filled 2022-03-21: qty 30, 30d supply, fill #0

## 2022-03-21 MED ORDER — FUROSEMIDE 40 MG PO TABS: 40.0000 mg | ORAL_TABLET | Freq: Every day | ORAL | Status: DC | PRN

## 2022-03-21 MED ORDER — SPIRONOLACTONE 25 MG PO TABS
25.0000 mg | ORAL_TABLET | Freq: Every day | ORAL | 0 refills | Status: DC
Start: 2022-03-22 — End: 2022-04-25
  Filled 2022-03-21: qty 30, 30d supply, fill #0

## 2022-03-21 MED ORDER — POTASSIUM CHLORIDE CRYS ER 20 MEQ PO TBCR
40.0000 meq | EXTENDED_RELEASE_TABLET | Freq: Every day | ORAL | 0 refills | Status: DC | PRN
  Filled 2022-03-21: qty 60, 30d supply, fill #0

## 2022-03-21 MED ORDER — METFORMIN HCL 500 MG PO TABS: 500.0000 mg | ORAL_TABLET | Freq: Two times a day (BID) | ORAL | Status: DC

## 2022-03-21 MED ORDER — CONTOUR NEXT TEST VI STRP
ORAL_STRIP | 0 refills | Status: DC
  Filled 2022-03-21: qty 100, 25d supply, fill #0

## 2022-03-21 NOTE — Progress Notes (Signed)
Discussed with pt and girlfriend walking or riding stationary bike for exercise and CRPII. Pt receptive. He had questions regarding sweating for weight loss. Discussed the differences and instructed to not use these therapies. Will refer to [place]. ?[id number] ?Yves Dill CES, ACSM ?1:44 PM ?03/21/2022 ? ?

## 2022-03-21 NOTE — Assessment & Plan Note (Addendum)
Hyponatremia, hypokalemia.  ?Renal function at his discharge is 1,0 from peak cr at 1,28 ?K is 4,9 and Na 130  ?

## 2022-03-21 NOTE — Discharge Instructions (Signed)
Information on my medicine - ELIQUIS? (apixaban) ? ?Why was Eliquis? prescribed for you? ?Eliquis? was prescribed for you to reduce the risk of forming blood clots.  ? ?What do You need to know about Eliquis? ? ?Take your Eliquis? TWICE DAILY - one tablet in the morning and one tablet in the evening with or without food.  It would be best to take the doses about the same time each day. ? ?If you have difficulty swallowing the tablet whole please discuss with your pharmacist how to take the medication safely. ? ?Take Eliquis? exactly as prescribed by your doctor and DO NOT stop taking Eliquis? without talking to the doctor who prescribed the medication.  Stopping may increase your risk of developing a new clot or stroke.  Refill your prescription before you run out. ? ?After discharge, you should have regular check-up appointments with your healthcare provider that is prescribing your Eliquis?.  In the future your dose may need to be changed if your kidney function or weight changes by a significant amount or as you get older. ? ?What do you do if you miss a dose? ?If you miss a dose, take it as soon as you remember on the same day and resume taking twice daily.  Do not take more than one dose of ELIQUIS at the same time. ? ?Important Safety Information ?A possible side effect of Eliquis? is bleeding. You should call your healthcare provider right away if you experience any of the following: ?Bleeding from an injury or your nose that does not stop. ?Unusual colored urine (red or dark brown) or unusual colored stools (red or black). ?Unusual bruising for unknown reasons. ?A serious fall or if you hit your head (even if there is no bleeding). ? ?Some medicines may interact with Eliquis? and might increase your risk of bleeding or clotting while on Eliquis?Marland Kitchen To help avoid this, consult your healthcare provider or pharmacist prior to using any new prescription or non-prescription medications, including herbals, vitamins,  non-steroidal anti-inflammatory drugs (NSAIDs) and supplements. ? ?This website has more information on Eliquis? (apixaban): http://www.eliquis.com/eliquis/home ?  ?

## 2022-03-21 NOTE — Assessment & Plan Note (Addendum)
Iron panel with serum iron 37, TIBC 448 and transferrin saturation 8 with ferritin 95.  ?Patient received IV iron 03/21 during his hospitalization, plan to follow up iron panel as outpatient.  ?

## 2022-03-21 NOTE — Progress Notes (Signed)
Discharging home today. ? ?F/u in HF clinic arranged. ? ?Medications: ?Digoxin 0.125 mg daily ?Spiro 25 mg daily ?Farxiga 10 mg daily ?Entresto 24/26 BID ?Eliquis 5 mg BID ?Furosemide 40 mg PRN and 40 mEq KCL PRN when taking furosemide ? ? ?Continue to hold statin. May restart at f/u if LFTs continue to improve. ? ?Would not restart beta blocker. ? ? ?PharmD assisting with Rx for ozempic to aid in weightloss. ? ?

## 2022-03-21 NOTE — Assessment & Plan Note (Addendum)
Uncontrolled hyperglycemia.  ?Patient has placed on insulin sliding scale for glucose cover and monitoring. ?At his discharge his fasting glucose is 148. ?Patient will resume taking metformin with aggressive life style modifications.  ?Added semaglutide.  ?

## 2022-03-21 NOTE — Assessment & Plan Note (Signed)
AST 151, ALT 441,  ?Likely congestive hepatopathy. Plan to follow up as outpatient. ?No clinical signs of decompensated liver disease.  ?Further work up with abdominal US with gallbladder wall thickening up to 0,9 cm with no gallstones, negative sonographic Murphy sign.  ?Hepatic steatosis.  ?

## 2022-03-21 NOTE — Hospital Course (Signed)
Robert Mccann was admitted to the hospital with the working diagnosis of decompensated systolic heart failure.  ? ?26 yo male with the past medical history of heart failure, CVA, pulmonary embolism sp thrombectomy, type 2 diabetes mellitus, hypertension and obesity. He was admitted to [facility] on 3/14 with decompensated heart failure and new pulmonary embolism. He was placed on heparin drip and diuresis with furosemide. Transferred to [facility] for further heart failure evaluation. At the time of transfer his blood pressure was 113/73, HR 95, RR 14 and oxygen saturation 97%, heart with S1 and S2 present and rhythmic, lungs with no wheezing or rales, abdomen soft and no lower extremity edema.  ? ?Patient was placed on milrinone and continued diuresis with furosemide.  ?Negative fluid balance was achieved.  ? ?Echocardiogram with LV systolic function less than 20%, with global hypokinesis and severe RV dysfunction.  ?Positive LV thrombus.  ? ? ?

## 2022-03-21 NOTE — Discharge Summary (Addendum)
Physician Discharge Summary   Patient: Robert Mccann MRN: [id number] DOB: 1996/04/24  Admit date:     03/16/2022  Discharge date: 03/21/22  Discharge Physician: York Ram Ania Levay   PCP: Nira Retort   Recommendations at discharge:    Patient has been on heart failure regimen including dapagliflozin, digoxin, entresto and spironolactone. Holding on B blockade.  For T2DM continue with metformin, added semaglutide  and continue with  aggressive life style modifications.   Discharge Diagnoses: Principal Problem:   Acute on chronic systolic (congestive) heart failure (HCC) Active Problems:   Acute pulmonary embolism (HCC)   LV (left ventricular) mural thrombus   Essential hypertension   AKI (acute kidney injury) (HCC)   Type 2 diabetes mellitus with hyperlipidemia (HCC)   Shock liver   Iron deficiency   Class 3 obesity (HCC)  Resolved Problems:   Iron deficiency anemia  Hospital Course: Robert Mccann was admitted to the hospital with the working diagnosis of decompensated systolic heart failure.   26 yo male with the past medical history of heart failure, CVA, pulmonary embolism sp thrombectomy, type 2 diabetes mellitus, hypertension and obesity. He was admitted to [facility] on 3/14 with decompensated heart failure and new pulmonary embolism. He was placed on heparin drip and diuresis with furosemide. Transferred to [facility] for further heart failure evaluation. At the time of transfer his blood pressure was 113/73, HR 95, RR 14 and oxygen saturation 97%, heart with S1 and S2 present and rhythmic, lungs with no wheezing or rales, abdomen soft and no lower extremity edema.   Patient was placed on milrinone and continued diuresis with furosemide.  Negative fluid balance was achieved.   Echocardiogram with LV systolic function less than 20%, with global hypokinesis and severe RV dysfunction.  Positive LV thrombus.     Assessment and Plan: * Acute on chronic systolic  (congestive) heart failure (HCC) Echocardiogram with LV systolic function <20%, with global hypokinesis, positive LV thrombus, severe reduction in Rv systolic function, moderate RV dilatation. Moderate TR.  Patient has been diuresed and negative fluid balance has been achieved, since admission -11,756 ml, with improvement of his symptoms.  Cardiac MRI with severe LV dilatation, moderate RV dilatation, large LV thrombus, moderate pericardial effusion, moderate TR.   Patient was placed on milrinone for inotropic support.  Placed on heart failure management with spironolactone, digoxin and dapagliflozin.   His volume status improved and inotropic support was discontinued.  Patient will be discharged on heart failure regimen including entresto,   Acute pulmonary embolism [facility]) Patient has bee non compliant with medical therapy. Continue anticoagulation with apixaban.    LV (left ventricular) mural thrombus Patient will continue anticoagulation with apixaban.  Initially placed on IV heparin, likely not candidate for warfarin due to low compliance with medical therapy.   Essential hypertension Continue blood pressure control with spironolactone.   AKI (acute kidney injury) (HCC) Hyponatremia, hypokalemia.  Renal function at his discharge is 1,0 from peak cr at 1,28 K is 4,9 and Na 130   Type 2 diabetes mellitus with hyperlipidemia (HCC) Uncontrolled hyperglycemia.  Patient has placed on insulin sliding scale for glucose cover and monitoring. At his discharge his fasting glucose is 148. Patient will resume taking metformin with aggressive life style modifications.   Shock liver AST 151, ALT 441,  Likely congestive hepatopathy. Plan to follow up as outpatient. No clinical signs of decompensated liver disease.  Further work up with abdominal US with gallbladder wall thickening up to 0,9 cm with no gallstones,  negative sonographic Murphy sign.  Hepatic steatosis.   Iron  deficiency Iron panel with serum iron 37, TIBC 448 and transferrin saturation 8 with ferritin 95.  Patient received IV iron 03/21 during his hospitalization, plan to follow up iron panel as outpatient.   Class 3 obesity (HCC) Calculated BMI is 41,9          Consultants: cardiology  Procedures performed: none   Disposition: Home Diet recommendation:  Carb modified diet DISCHARGE MEDICATION: Allergies as of 03/21/2022   No Known Allergies      Medication List     STOP taking these medications    carvedilol 3.125 MG tablet Commonly known as: COREG   furosemide 10 MG/ML injection Commonly known as: LASIX Replaced by: furosemide 40 MG tablet   heparin [address] UT/250ML infusion       TAKE these medications    apixaban 5 MG Tabs tablet Commonly known as: ELIQUIS Take 1 tablet (5 mg total) by mouth 2 (two) times daily.   Blood Glucose Monitor System w/Device Kit Use up to four times daily as directed.   Blood Glucose Monitor System w/Device Kit Use up to four times daily as directed.   Contour Next Test test strip Generic drug: glucose blood Use as directed up to 4 times daily.   dapagliflozin propanediol 10 MG Tabs tablet Commonly known as: FARXIGA Take 1 tablet (10 mg total) by mouth daily. Start taking on: March 22, 2022   digoxin 0.125 MG tablet Commonly known as: LANOXIN Take 1 tablet (0.125 mg total) by mouth daily. Start taking on: March 22, 2022   furosemide 40 MG tablet Commonly known as: LASIX Take 1 tablet (40 mg total) by mouth daily as needed for edema or fluid (take in case of weight gain 3 lbs in 24 to 48 hrs or 5 lbs in 7 days, leg swelling or shortness of breath). Replaces: furosemide 10 MG/ML injection   metFORMIN 500 MG tablet Commonly known as: GLUCOPHAGE Take 1 tablet (500 mg total) by mouth 2 (two) times daily with a meal. Start taking on: March 22, 2022   Ozempic (0.25 or 0.5 MG/DOSE) 2 MG/1.5ML Sopn Generic drug:  Semaglutide(0.25 or 0.5MG /DOS) Inject 0.25 mg into the skin once a week. Notes to patient: Do not start until appointment on 3/30 - bring medication with you to appointment at [facility]   potassium chloride SA 20 MEQ tablet Commonly known as: KLOR-CON M Take 2 tablets (40 mEq total) by mouth daily as needed (take with furosemide.).   sacubitril-valsartan 24-26 MG Commonly known as: ENTRESTO Take 1 tablet by mouth 2 (two) times daily.   spironolactone 25 MG tablet Commonly known as: ALDACTONE Take 1 tablet (25 mg total) by mouth daily. Start taking on: March 22, 2022        Discharge Exam: Ceasar Mons Weights   03/19/22 0606 03/20/22 0542 03/21/22 0549  Weight: (!) 138.9 kg (!) 141 kg (!) 140.3 kg   BP 115/84 (BP Location: Left Arm)   Pulse 95   Temp 98.6 F (37 C) (Oral)   Resp 18   Ht 6' (1.829 m)   Wt (!) 140.3 kg   SpO2 99%   BMI 41.95 kg/m   Neurology awake and alert ENT with no pallor Cardiovascular with S1 and S2 present and rhythmic with no gallops or murmurs, no rubs No JVD No lower extremity edema Respiratory with no rales or wheezing  Abdomen soft and non tender, protuberant   Condition at discharge: stable  The results of significant diagnostics from this hospitalization (including imaging, microbiology, ancillary and laboratory) are listed below for reference.   Imaging Studies: DG Chest 2 View  Result Date: 03/13/2022 CLINICAL DATA:  Weakness, fatigue EXAM: CHEST - 2 VIEW COMPARISON:  Prior chest x-ray 06/27/2021 FINDINGS: Similar degree of marked enlargement of the cardiopericardial silhouette. Pulmonary vascular congestion is present without overt edema. No focal airspace infiltrate, pleural effusion or pneumothorax. No acute osseous abnormality. IMPRESSION: Significant cardiomegaly with moderate pulmonary vascular congestion but no overt edema. Findings are similar compared to prior imaging from June of 2022. No definite acute cardiopulmonary process.  Electronically Signed   By: Malachy Moan M.D.   On: 03/13/2022 15:48   CT Angio Chest PE W/Cm &/Or Wo Cm  Result Date: 03/13/2022 CLINICAL DATA:  Heart racing EXAM: CT ANGIOGRAPHY CHEST WITH CONTRAST TECHNIQUE: Multidetector CT imaging of the chest was performed using the standard protocol during bolus administration of intravenous contrast. Multiplanar CT image reconstructions and MIPs were obtained to evaluate the vascular anatomy. RADIATION DOSE REDUCTION: This exam was performed according to the departmental dose-optimization program which includes automated exposure control, adjustment of the mA and/or kV according to patient size and/or use of iterative reconstruction technique. CONTRAST:  OMNIPAQUE IOHEXOL 350 MG/ML SOLN COMPARISON:  CT chest angiogram dated July 27, 2021 FINDINGS: Cardiovascular: Adequate contrast opacification of the central pulmonary arteries. Pulmonary embolus of a segmental left upper lobe pulmonary artery, new when compared to prior exam. Additional linear filling defects are seen in bilateral subsegmental at areas of previous pulmonary embolus, likely chronic.Cardiomegaly. No pericardial effusion.New rounded filling defect arising from the wall of the right atrium. No CT evidence of right heart strain. Mediastinum/Nodes: Esophagus and thyroid are unremarkable. No pathologically enlarged lymph nodes seen in the chest. Lungs/Pleura: Central airways are patent. No consolidation, pleural effusion or pneumothorax. Upper Abdomen: No acute abnormality. Musculoskeletal: No chest wall abnormality. No acute or significant osseous findings. Review of the MIP images confirms the above findings. IMPRESSION: 1. Pulmonary embolus of a segmental left upper lobe pulmonary artery, new when compared to prior exam. Additional linear filling defects are seen in bilateral subsegmental at areas of previous pulmonary embolus, likely chronic. 2. New rounded filling defect arising from the wall  of the right atrium, concerning for right atrial thrombus. Recommend echocardiography for further evaluation. 3. Small peribronchovascular ground-glass opacity of the left upper lobe, likely infectious or inflammatory. 4. Cardiomegaly. Critical Value/emergent results were called by telephone at the time of interpretation on 03/13/2022 at 5:19 pm to provider [facility] , who verbally acknowledged these results. Electronically Signed   By: Allegra Lai M.D.   On: 03/13/2022 17:21   US Abdomen Complete  Result Date: 03/16/2022 CLINICAL DATA:  Abnormal LFTs EXAM: ABDOMEN ULTRASOUND COMPLETE COMPARISON:  None. FINDINGS: Gallbladder: Gallbladder wall thickening up to 0.9 cm. No calculi identified. No sonographic Murphy sign noted by sonographer. Common bile duct: Diameter: 0.2 cm Liver: No focal lesion identified. Increased parenchymal echogenicity. Portal vein is patent on color Doppler imaging with normal direction of blood flow towards the liver. IVC: No abnormality visualized. Pancreas: Visualized portion unremarkable. Spleen: Size and appearance within normal limits. Right Kidney: Length: 12.0 cm. Echogenicity within normal limits. No mass or hydronephrosis visualized. Left Kidney: Length: 11.9 cm. Echogenicity within normal limits. No mass or hydronephrosis visualized. Abdominal aorta: No aneurysm visualized. Other findings: None. IMPRESSION: 1. Gallbladder wall thickening up to 0.9 cm. No gallstones identified. No common bile duct dilatation. Negative sonographic  Murphy sign. 2. Hepatic steatosis. Electronically Signed   By: Jearld Lesch M.D.   On: 03/16/2022 09:38   US Venous Img Lower  Left (DVT Study)  Result Date: 03/13/2022 CLINICAL DATA:  Calf pain x1 month EXAM: LEFT LOWER EXTREMITY VENOUS DOPPLER ULTRASOUND TECHNIQUE: Gray-scale sonography with compression, as well as color and duplex ultrasound, were performed to evaluate the deep venous system(s) from the level of the common femoral vein  through the popliteal and proximal calf veins. COMPARISON:  None. FINDINGS: VENOUS Normal compressibility of the common femoral, superficial femoral, and popliteal veins, as well as the visualized calf veins. Visualized portions of profunda femoral vein and great saphenous vein unremarkable. No filling defects to suggest DVT on grayscale or color Doppler imaging. Doppler waveforms show normal direction of venous flow, normal respiratory plasticity and response to augmentation. Limited views of the contralateral common femoral vein are unremarkable. OTHER None. Limitations: none IMPRESSION: Negative. Electronically Signed   By: Malachy Moan M.D.   On: 03/13/2022 16:46   MR CARDIAC MORPHOLOGY W WO CONTRAST  Result Date: 03/20/2022 CLINICAL DATA:  45M with HFrEF, CVA, PE. Echo 03/14/22 with large LV thrombus, EF <20%, severe RV dysfunction EXAM: CARDIAC MRI TECHNIQUE: The patient was scanned on a 1.5 Tesla Siemens magnet. A dedicated cardiac coil was used. Functional imaging was done using Fiesta sequences. 2,3, and 4 chamber views were done to assess for RWMA's. Modified Simpson's rule using a short axis stack was used to calculate an ejection fraction on a dedicated work [employer]. The patient received 10 cc of Gadavist. After 10 minutes inversion recovery sequences were used to assess for infiltration and scar tissue. CONTRAST:  10 cc  of Gadavist FINDINGS: Left ventricle: -LV hypertrabeculation -Severely dilated -Severe systolic dysfunction -Large LV thrombus adjacent to anteroseptum measuring 58mm x 27mm. Smaller LV thrombus adjacent to inferior wall measuring 39mm x 9mm -Nonspecific ECV elevation (33%) -Normal T2 -LGE primarily in the noncompacted regions in the basal to apical anterior wall, apex, and apical to mid inferior wall, but appears to extend into compacted subendocardium, particularly in the apical inferior wall and apex. Subendodardial LGE suggests infarct, and would  recommend ruling out obstructive CAD, but can also be seen in LV noncompaction cardiomyopathy LV EF: 11% (Normal 56-78%) Absolute volumes: LV EDV: (Normal 77-195 mL) LV ESV: (Normal 19-72 mL) LV SV: 47mL (Normal 51-133 mL) CO: 4.3L/min (Normal 2.8-8.8 L/min) Indexed volumes: LV EDV: 110mL/sq-m (Normal 47-92 mL/sq-m) LV ESV: 157mL/sq-m (Normal 13-30 mL/sq-m) LV SV: 36mL/sq-m (Normal 32-62 mL/sq-m) CI: 1.6L/min/sq-m (Normal 1.7-4.2 L/min/sq-m) Right ventricle: Moderate dilatation with severe systolic dysfunction RV EF:  19% (Normal 47-74%) Absolute volumes: RV EDV: (Normal 88-227 mL) RV ESV: (Normal 23-103 mL) RV SV: 65mL (Normal 52-138 mL) CO: 5.9L/min (Normal 2.8-8.8 L/min) Indexed volumes: RV EDV: 164mL/sq-m (Normal 55-105 mL/sq-m) RV ESV: 129mL/sq-m (Normal 15-43 mL/sq-m) RV SV: 31mL/sq-m (Normal 32-64 mL/sq-m) CI: 2.2L/min/sq-m (Normal 1.7-4.2 L/min/sq-m) Left atrium: Moderate enlargement Right atrium: Normal size Mitral valve: Mild regurgitation (regurgitant fraction 11%) Aortic valve: Trivial regurgitation Tricuspid valve: Moderate regurgitation (regurgitant fraction 34%) Pulmonic valve: Trivial regurgitation Aorta: Normal proximal ascending aorta Pericardium: Moderate effusion measuring up to 18mm adjacent to LV inferior wall IMPRESSION: 1. Severe LV dilatation with severe systolic dysfunction (EF 11%) 2. Moderate RV dilatation with severe systolic dysfunction (EF 19%) 3. Large LV thrombus adjacent to anterior/anteroseptal walls measuring 58mm x 27mm. Smaller LV thrombus adjacent to inferior wall measuring 39mm x 9mm 4. Hypertrabeculation  of LV that meets criteria for noncompaction, though hypertrabeculation can also be seen in dilated cardiomyopathies 5. LGE primarily in the noncompacted regions in the basal to apical anterior wall, apex, and apical to mid inferior wall, but appears to extend into compacted subendocardium, particularly in the apical inferior wall and apex. Given  subendocardial LGE, would recommend ruling out obstructive CAD, but subendocardial LGE can also be seen in noncompaction 6. Moderate pericardial effusion measuring up to 18mm adjacent to LV inferior wall 7. Moderate tricuspid regurgitation (regurgitant fraction 34%) 8. Mild mitral regurgitation (regurgitant fraction 11%) Electronically Signed   By: Epifanio Lesches M.D.   On: 03/20/2022 01:10   ECHOCARDIOGRAM COMPLETE  Result Date: 03/14/2022    ECHOCARDIOGRAM REPORT   Patient Name:   ADMA GAERTNER Date of Exam: 03/14/2022 Medical Rec #:  [id number]        Height:       72.0 in Accession #:    [id number]       Weight:       325.0 lb Date of Birth:  25-Apr-1996       BSA:          2.619 m Patient Age:    25 years         BP:           120/98 mmHg Patient Gender: M                HR:           98 bpm. Exam Location:  [facility] Procedure: 2D Echo, Color Doppler and Cardiac Doppler         REPORT CONTAINS CRITICAL RESULT Reported to: Agbor-Etang on 03/14/2022 9:23:00 AM              Large apical thrombus. Indications:     I26.09 Pulmonary Embolus  History:         Patient has prior history of Echocardiogram examinations, most                  recent 11/08/2021. Cardiomyopathy and HFrEF, Stroke; Risk                  Factors:Hypertension and Diabetes.  Sonographer:     Humphrey Rolls Referring Phys:  [id number] AMY N COX Diagnosing Phys: Debbe Odea MD  Sonographer Comments: Suboptimal apical window and no subcostal window. IMPRESSIONS  1. An LV thrombus measuring 6.0 x 2.7 is noted, attached to the apex. Non-compaction also noted in the inferior and infero lateral walls. Findings suggesting non compaction cardiomyopathy.. Left ventricular ejection fraction, by estimation, is <20%. The  left ventricle has severely decreased function. The left ventricle demonstrates global hypokinesis. The left ventricular internal cavity size was mildly dilated. Left ventricular diastolic parameters are consistent with Grade II  diastolic dysfunction (pseudonormalization).  2. Right ventricular systolic function is severely reduced. The right ventricular size is moderately enlarged.  3. The mitral valve is normal in structure. Mild mitral valve regurgitation.  4. Tricuspid valve regurgitation is moderate.  5. The aortic valve is tricuspid. Aortic valve regurgitation is not visualized.  6. The inferior vena cava is dilated in size with <50% respiratory variability, suggesting right atrial pressure of 15 mmHg. FINDINGS  Left Ventricle: An LV thrombus measuring 6.0 x 2.7 is noted, attached to the apex. Non-compaction also noted in the inferior and infero lateral walls. Findings suggesting non compaction cardiomyopathy. Left ventricular ejection fraction, by estimation, is <20%. The left ventricle has severely decreased function.  The left ventricle demonstrates global hypokinesis. The left ventricular internal cavity size was mildly dilated. There is no left ventricular hypertrophy. Left ventricular diastolic parameters  are consistent with Grade II diastolic dysfunction (pseudonormalization). Right Ventricle: The right ventricular size is moderately enlarged. No increase in right ventricular wall thickness. Right ventricular systolic function is severely reduced. Left Atrium: Left atrial size was normal in size. Right Atrium: Right atrial size was normal in size. Pericardium: There is no evidence of pericardial effusion. Mitral Valve: The mitral valve is normal in structure. Mild mitral valve regurgitation. MV peak gradient, 2.7 mmHg. The mean mitral valve gradient is 1.0 mmHg. Tricuspid Valve: The tricuspid valve is normal in structure. Tricuspid valve regurgitation is moderate. Aortic Valve: The aortic valve is tricuspid. Aortic valve regurgitation is not visualized. Aortic valve mean gradient measures 3.0 mmHg. Aortic valve peak gradient measures 5.6 mmHg. Aortic valve area, by VTI measures 1.13 cm. Pulmonic Valve: The pulmonic valve was  normal in structure. Pulmonic valve regurgitation is trivial. Aorta: The aortic root is normal in size and structure. Venous: The inferior vena cava is dilated in size with less than 50% respiratory variability, suggesting right atrial pressure of 15 mmHg. IAS/Shunts: No atrial level shunt detected by color flow Doppler.  LEFT VENTRICLE PLAX 2D LVIDd:         6.10 cm   Diastology LVIDs:         5.74 cm   LV e' medial:    3.70 cm/s LV PW:         1.43 cm   LV E/e' medial:  18.4 LV IVS:        0.99 cm   LV e' lateral:   5.44 cm/s LVOT diam:     2.00 cm   LV E/e' lateral: 12.5 LV SV:         18 LV SV Index:   7 LVOT Area:     3.14 cm  RIGHT VENTRICLE RV Basal diam:  5.28 cm LEFT ATRIUM             Index        RIGHT ATRIUM           Index LA diam:        5.10 cm 1.95 cm/m   RA Area:     32.60 cm LA Vol (A2C):   76.7 ml 29.29 ml/m  RA Volume:   141.00 ml 53.84 ml/m LA Vol (A4C):   68.8 ml 26.27 ml/m LA Biplane Vol: 77.1 ml 29.44 ml/m  AORTIC VALVE                    PULMONIC VALVE AV Area (Vmax):    1.27 cm     PV Vmax:       0.76 m/s AV Area (Vmean):   1.25 cm     PV Vmean:      43.300 cm/s AV Area (VTI):     1.13 cm     PV VTI:        0.099 m AV Vmax:           118.00 cm/s  PV Peak grad:  2.3 mmHg AV Vmean:          80.700 cm/s  PV Mean grad:  1.0 mmHg AV VTI:            0.161 m AV Peak Grad:      5.6 mmHg AV Mean Grad:      3.0 mmHg  LVOT Vmax:         47.60 cm/s LVOT Vmean:        32.100 cm/s LVOT VTI:          0.058 m LVOT/AV VTI ratio: 0.36  AORTA Ao Root diam: 2.80 cm MITRAL VALVE               TRICUSPID VALVE MV Area (PHT): 3.24 cm    TR Peak grad:   41.5 mmHg MV Area VTI:   1.66 cm    TR Vmax:        322.00 cm/s MV Peak grad:  2.7 mmHg MV Mean grad:  1.0 mmHg    SHUNTS MV Vmax:       0.83 m/s    Systemic VTI:  0.06 m MV Vmean:      50.1 cm/s   Systemic Diam: 2.00 cm MV Decel Time: 234 msec MV E velocity: 68.15 cm/s Debbe Odea MD Electronically signed by Debbe Odea MD Signature  Date/Time: 03/14/2022/5:05:01 PM    Final    [place] EKG SITE RITE  Result Date: 03/16/2022 If Site Rite image not attached, placement could not be confirmed due to current cardiac rhythm.   Microbiology: Results for orders placed or performed during the hospital encounter of 03/16/22  MRSA Next Gen by PCR, Nasal     Status: None   Collection Time: 03/16/22  7:29 PM   Specimen: Nasal Mucosa; Nasal Swab  Result Value Ref Range Status   MRSA by PCR Next Gen NOT DETECTED NOT DETECTED Final    Comment: (NOTE) The GeneXpert MRSA Assay (FDA approved for NASAL specimens only), is one component of a comprehensive MRSA colonization surveillance program. It is not intended to diagnose MRSA infection nor to guide or monitor treatment for MRSA infections. Test performance is not FDA approved in patients less than 25 years old. Performed at [facility], [address], [place], [place] [address]     Labs: CBC: Recent Labs  Lab 03/17/22 0127 03/18/22 0520 03/19/22 0505 03/20/22 0515 03/21/22 0400  WBC 8.1 7.4 7.5 7.3 8.0  NEUTROABS 4.7 4.1 4.1 3.7 5.0  HGB 14.3 14.4 16.7 17.3* 16.2  HCT 41.7 41.8 50.0 51.9 51.0  MCV 85.8 85.8 87.3 87.4 88.7  PLT 329 366 401* 368 380   Basic Metabolic Panel: Recent Labs  Lab 03/15/22 0653 03/16/22 0546 03/17/22 0127 03/18/22 0520 03/19/22 0505 03/20/22 0515 03/21/22 0400  NA 132*   < > 133* 131* 130* 129* 130*  K 3.4*   < > 3.2* 3.5 3.7 4.3 4.9  CL 96*   < > 95* 91* 95* 95* 98  CO2 26   < > 28 31 27 26 22   GLUCOSE 140*   < > 172* 259* 240* 200* 148*  BUN 14   < > 15 11 9 11 10   CREATININE 1.22   < > 1.44* 1.28* 1.15 1.12 1.01  CALCIUM 8.4*   < > 8.4* 8.1* 8.4* 8.5* 9.0  MG 1.8  --  1.7 1.6* 4.4* 2.1 2.2  PHOS 4.5  --   --   --   --   --   --    < > = values in this interval not displayed.   Liver Function Tests: Recent Labs  Lab 03/17/22 0127 03/18/22 0520 03/19/22 0505 03/20/22 0515 03/21/22 0400  AST 3,128* 1,201* 524* 250*  151*  ALT 1,663* 1,154* 856* 575* 441*  ALKPHOS 136* 150* 191* 170* 163*  BILITOT 1.6* 1.5* 1.1 1.1 1.1  PROT 6.4* 6.5 6.9 6.7 6.8  ALBUMIN 2.7* 2.5* 2.5* 2.5* 2.6*   CBG: Recent Labs  Lab 03/20/22 1053 03/20/22 1619 03/20/22 1639 03/20/22 2115 03/21/22 0604  GLUCAP 197* 311* 272* 192* 165*    Discharge time spent: greater than 30 minutes.  Signed: Coralie Keens, MD [facility] 03/21/2022

## 2022-03-21 NOTE — Assessment & Plan Note (Signed)
Continue blood pressure control with spironolactone.  ?

## 2022-03-21 NOTE — Assessment & Plan Note (Signed)
Calculated BMI is 41,9  ?

## 2022-03-21 NOTE — Progress Notes (Signed)
Nutrition Education Note ? ?RD consulted for nutrition education regarding CHF and poorly controlled DM. ? ?Spoke with pt and visitor at bedside. Pt reports appetite improving with diuresis. Pt reports recently eating a few small meals daily. He would like to increase the frequency of his PO intake. Pt eats out frequently and enjoys [place] food. He also likes drinking juice. ? ?Lab Results  ?Component Value Date  ? HGBA1C 8.9 (H) 03/13/2022  ? ?RD provided "Low Sodium Nutrition Therapy" handout from the Academy of Nutrition and Dietetics. Reviewed patient's dietary recall. Provided examples on ways to decrease sodium intake in diet. Discouraged intake of processed foods and use of salt shaker. Encouraged fresh fruits and vegetables as well as whole grain sources of carbohydrates to maximize fiber intake.  ? ?RD discussed why it is important for patient to adhere to diet recommendations, and emphasized the role of fluids, foods to avoid, and importance of weighing self daily. ? ?RD also provided "Plate Method for Diabetes" handout from the Academy of Nutrition and Dietetics. Discussed different food groups and their effects on blood sugar, emphasizing carbohydrate-containing foods. Provided list of carbohydrates and recommended serving sizes of common foods. ? ?Discussed importance of controlled and consistent carbohydrate intake throughout the day. Provided examples of ways to balance meals/snacks and encouraged intake of high-fiber, whole grain complex carbohydrates. Teach back method used. ? ?Expect fair to good compliance. ? ?Current diet order is Heart Healthy, patient is consuming approximately 100% of meals at this time. Labs and medications reviewed. No further nutrition interventions warranted at this time. RD contact information provided. If additional nutrition issues arise, please re-consult RD. ? ? ?Mertie Clause, MS, RD, LDN ?Inpatient Clinical Dietitian ?Please see AMiON for contact information. ? ?

## 2022-03-21 NOTE — Assessment & Plan Note (Signed)
Patient has bee non compliant with medical therapy. ?Continue anticoagulation with apixaban.  ? ?

## 2022-03-21 NOTE — Progress Notes (Addendum)
? ? ?Advanced Heart Failure Rounding Note ? ? ?Subjective:   ?3/20 Milrinone cut back to 0.125 mcg.  ?3/21 Milrinone off ? ?Co-ox 68% today off milrinone. CVP disconnected. ? ?Has been up ambulating halls. No dyspnea, orthopnea or PND.  ? ?BP stable. ? ?Intermittent type I second degree AV block this am, pauses up to 3 seconds. RN reports patient was sleeping during this time. ? ? ?CMRI- ?1. Severe LV dilatation with severe systolic dysfunction (EF A999333) ? 2. Moderate RV dilatation with severe systolic dysfunction (EF 99991111)  ?3. Large LV thrombus adjacent to anterior/anteroseptal walls measuring 39mm x 51mm. Smaller LV thrombus adjacent to inferior wall ?measuring 25mm x 30mm  ?4. Hypertrabeculation of LV that meets criteria for noncompaction, though hypertrabeculation can also be seen in dilated cardiomyopathies  ?5. LGE primarily in the noncompacted regions in the basal to apical anterior wall, apex, and apical to mid inferior wall, but appears to extend into compacted subendocardium, particularly in the apical inferior wall and apex. Given subendocardial LGE, would recommend ruling out obstructive CAD, but subendocardial LGE can also be seen in non compaction  ?6. Moderate pericardial effusion measuring up to 53mm adjacent to LV inferior wall  ?7. Moderate tricuspid regurgitation (regurgitant fraction 34%)  ?8. Mild mitral regurgitation (regurgitant fraction 11%) ? ? ? ?Objective:   ?Weight Range: 328>>309 lb ? ?Vital Signs:   ?Temp:  [98.4 ?F (36.9 ?C)-98.7 ?F (37.1 ?C)] 98.6 ?F (37 ?C) (03/22 0756) ?Pulse Rate:  [90-95] 95 (03/22 0756) ?Resp:  [16-19] 18 (03/22 0756) ?BP: (103-129)/(73-92) 115/84 (03/22 0756) ?SpO2:  [94 %-99 %] 99 % (03/22 0756) ?Weight:  [140.3 kg] 140.3 kg (03/22 0549) ?Last BM Date : 03/18/22 ? ?Weight change: ?Filed Weights  ? 03/19/22 0606 03/20/22 0542 03/21/22 0549  ?Weight: (!) 138.9 kg (!) 141 kg (!) 140.3 kg  ? ? ?Intake/Output:  ? ?Intake/Output Summary (Last 24 hours) at 03/21/2022  0858 ?Last data filed at 03/21/2022 0555 ?Gross per 24 hour  ?Intake 1320 ml  ?Output --  ?Net 1320 ml  ?  ? ?Physical Exam: ?General:  No distress. Sitting up in bed. ?HEENT: normal ?Neck: supple. no JVD. Carotids 2+ bilat; no bruits.  ?Cor: PMI nondisplaced. Regular rate & rhythm. No rubs, gallops or murmurs. ?Lungs: clear ?Abdomen: soft, nontender, nondistended, obese. No hepatosplenomegaly.  ?Extremities: no cyanosis, clubbing, rash, edema, + RUE PICC ?Neuro: alert & orientedx3, cranial nerves grossly intact. moves all 4 extremities w/o difficulty. Affect pleasant ? ? ? ?Telemetry: SR 90s - low 100s, intermittent wenckebach AV block this am with 2-3 second pauses while sleeping  ? ?Labs: ?Basic Metabolic Panel: ?Recent Labs  ?Lab 03/15/22 ?0653 03/16/22 ?[id number] 03/17/22 ?0127 03/18/22 ?[id number] 03/19/22 ?0505 03/20/22 ?0515 03/21/22 ?0400  ?NA 132*   < > 133* 131* 130* 129* 130*  ?K 3.4*   < > 3.2* 3.5 3.7 4.3 4.9  ?CL 96*   < > 95* 91* 95* 95* 98  ?CO2 26   < > 28 31 27 26 22   ?GLUCOSE 140*   < > 172* 259* 240* 200* 148*  ?BUN 14   < > 15 11 9 11 10   ?CREATININE 1.22   < > 1.44* 1.28* 1.15 1.12 1.01  ?CALCIUM 8.4*   < > 8.4* 8.1* 8.4* 8.5* 9.0  ?MG 1.8  --  1.7 1.6* 4.4* 2.1 2.2  ?PHOS 4.5  --   --   --   --   --   --   ? < > =  values in this interval not displayed.  ? ? ?Liver Function Tests: ?Recent Labs  ?Lab 03/16/22 ?[id number] 03/17/22 ?0127 03/18/22 ?[id number] 03/19/22 ?0505 03/20/22 ?0515  ?AST 3,970* 3,128* 1,201* 524* 250*  ?ALT 1,733* 1,663* 1,154* 856* 575*  ?ALKPHOS 124 136* 150* 191* 170*  ?BILITOT 2.1* 1.6* 1.5* 1.1 1.1  ?PROT 6.4* 6.4* 6.5 6.9 6.7  ?ALBUMIN 2.9* 2.7* 2.5* 2.5* 2.5*  ? ?No results for input(s): LIPASE, AMYLASE in the last 168 hours. ? ?No results for input(s): AMMONIA in the last 168 hours. ? ?CBC: ?Recent Labs  ?Lab 03/17/22 ?0127 03/18/22 ?[id number] 03/19/22 ?0505 03/20/22 ?0515 03/21/22 ?0400  ?WBC 8.1 7.4 7.5 7.3 8.0  ?NEUTROABS 4.7 4.1 4.1 3.7 5.0  ?HGB 14.3 14.4 16.7 17.3* 16.2  ?HCT 41.7 41.8  50.0 51.9 51.0  ?MCV 85.8 85.8 87.3 87.4 88.7  ?PLT 329 366 401* 368 380  ? ? ?Cardiac Enzymes: ?No results for input(s): CKTOTAL, CKMB, CKMBINDEX, TROPONINI in the last 168 hours. ? ?BNP: ?BNP (last 3 results) ?Recent Labs  ?  06/27/21 ?1855 09/06/21 ?1157 03/13/22 ?1530  ?BNP 740.6* 367.6* 1,083.1*  ? ? ?ProBNP (last 3 results) ?No results for input(s): PROBNP in the last 8760 hours. ? ? ? ?Other results: ? ?Imaging: ?MR CARDIAC MORPHOLOGY W WO CONTRAST ? ?Result Date: 03/20/2022 ?CLINICAL DATA:  28M with HFrEF, CVA, PE. Echo 03/14/22 with large LV thrombus, EF <20%, severe RV dysfunction EXAM: CARDIAC MRI TECHNIQUE: The patient was scanned on a 1.5 Tesla Siemens magnet. A dedicated cardiac coil was used. Functional imaging was done using Fiesta sequences. 2,3, and 4 chamber views were done to assess for RWMA's. Modified Simpson's rule using a short axis stack was used to calculate an ejection fraction on a dedicated work Conservation officer, nature. The patient received 10 cc of Gadavist. After 10 minutes inversion recovery sequences were used to assess for infiltration and scar tissue. CONTRAST:  10 cc  of Gadavist FINDINGS: Left ventricle: -LV hypertrabeculation -Severely dilated -Severe systolic dysfunction -Large LV thrombus adjacent to anteroseptum measuring 13mm x 89mm. Smaller LV thrombus adjacent to inferior wall measuring 18mm x 68mm -Nonspecific ECV elevation (33%) -Normal T2 -LGE primarily in the noncompacted regions in the basal to apical anterior wall, apex, and apical to mid inferior wall, but appears to extend into compacted subendocardium, particularly in the apical inferior wall and apex. Subendodardial LGE suggests infarct, and would recommend ruling out obstructive CAD, but can also be seen in LV noncompaction cardiomyopathy LV EF: 11% (Normal 56-78%) Absolute volumes: LV EDV: 440mL (Normal 77-195 mL) LV ESV: 320mL (Normal 19-72 mL) LV SV: 51mL (Normal 51-133 mL) CO: 4.3L/min (Normal 2.8-8.8  L/min) Indexed volumes: LV EDV: 177mL/sq-m (Normal 47-92 mL/sq-m) LV ESV: 147mL/sq-m (Normal 13-30 mL/sq-m) LV SV: 62mL/sq-m (Normal 32-62 mL/sq-m) CI: 1.6L/min/sq-m (Normal 1.7-4.2 L/min/sq-m) Right ventricle: Moderate dilatation with severe systolic dysfunction RV EF:  19% (Normal 47-74%) Absolute volumes: RV EDV: 344mL (Normal 88-227 mL) RV ESV: 251mL (Normal 23-103 mL) RV SV: 55mL (Normal 52-138 mL) CO: 5.9L/min (Normal 2.8-8.8 L/min) Indexed volumes: RV EDV: 179mL/sq-m (Normal 55-105 mL/sq-m) RV ESV: 168mL/sq-m (Normal 15-43 mL/sq-m) RV SV: 13mL/sq-m (Normal 32-64 mL/sq-m) CI: 2.2L/min/sq-m (Normal 1.7-4.2 L/min/sq-m) Left atrium: Moderate enlargement Right atrium: Normal size Mitral valve: Mild regurgitation (regurgitant fraction 11%) Aortic valve: Trivial regurgitation Tricuspid valve: Moderate regurgitation (regurgitant fraction 34%) Pulmonic valve: Trivial regurgitation Aorta: Normal proximal ascending aorta Pericardium: Moderate effusion measuring up to 67mm adjacent to LV inferior wall IMPRESSION: 1. Severe LV dilatation with severe  systolic dysfunction (EF [id number]) 2. Moderate RV dilatation with severe systolic dysfunction (EF 99991111) 3. Large LV thrombus adjacent to anterior/anteroseptal walls measuring 21mm x 82mm. Smaller LV thrombus adjacent to inferior wall measuring 3mm x 3mm 4. Hypertrabeculation of LV that meets criteria for noncompaction, though hypertrabeculation can also be seen in dilated cardiomyopathies 5. LGE primarily in the noncompacted regions in the basal to apical anterior wall, apex, and apical to mid inferior wall, but appears to extend into compacted subendocardium, particularly in the apical inferior wall and apex. Given subendocardial LGE, would recommend ruling out obstructive CAD, but subendocardial LGE can also be seen in noncompaction 6. Moderate pericardial effusion measuring up to 17mm adjacent to LV inferior wall 7. Moderate tricuspid regurgitation (regurgitant fraction 34%) 8.  Mild mitral regurgitation (regurgitant fraction 11%) Electronically Signed   By: Oswaldo Milian M.D.   On: 03/20/2022 01:10   ? ? ?Medications:   ? ? ?Scheduled Medications: ? apixaban  5 mg Oral BID

## 2022-03-21 NOTE — Assessment & Plan Note (Addendum)
Echocardiogram with LV systolic function 99991111, with global hypokinesis, positive LV thrombus, severe reduction in Rv systolic function, moderate RV dilatation. Moderate TR. ? ?Patient has been diuresed and negative fluid balance has been achieved, since admission -11,756 ml, with improvement of his symptoms.  ?Cardiac MRI with severe LV dilatation, moderate RV dilatation, large LV thrombus, moderate pericardial effusion, moderate TR.  ? ?Patient was placed on milrinone for inotropic support.  ?Placed on heart failure management with spironolactone, digoxin and dapagliflozin.  ? ?His volume status improved and inotropic support was discontinued.  ?Patient will be discharged on heart failure regimen including entresto,  ?

## 2022-03-21 NOTE — Assessment & Plan Note (Signed)
Patient will continue anticoagulation with apixaban.  ?Initially placed on IV heparin, likely not candidate for warfarin due to low compliance with medical therapy.  ?

## 2022-03-26 ENCOUNTER — Ambulatory Visit: Payer: 59 | Admitting: Family

## 2022-03-28 ENCOUNTER — Other Ambulatory Visit: Payer: Self-pay

## 2022-03-28 ENCOUNTER — Encounter (HOSPITAL_COMMUNITY): Payer: Self-pay

## 2022-03-28 ENCOUNTER — Ambulatory Visit (HOSPITAL_COMMUNITY)
Admission: RE | Admit: 2022-03-28 | Discharge: 2022-03-28 | Disposition: A | Discharge status: Home / Self Care | Payer: 59 | Source: Ambulatory Visit | Attending: Family Medicine | Admitting: Family Medicine

## 2022-03-28 ENCOUNTER — Other Ambulatory Visit (HOSPITAL_COMMUNITY): Payer: Self-pay

## 2022-03-28 ENCOUNTER — Telehealth (HOSPITAL_COMMUNITY): Payer: Self-pay

## 2022-03-28 VITALS — BP 136/98 | HR 114 | Wt 313.6 lb

## 2022-03-28 DIAGNOSIS — I5023 Acute on chronic systolic (congestive) heart failure: Secondary | ICD-10-CM

## 2022-03-28 DIAGNOSIS — I5082 Biventricular heart failure: Secondary | ICD-10-CM | POA: Insufficient documentation

## 2022-03-28 DIAGNOSIS — Z79899 Other long term (current) drug therapy: Secondary | ICD-10-CM | POA: Diagnosis not present

## 2022-03-28 DIAGNOSIS — Z8673 Personal history of transient ischemic attack (TIA), and cerebral infarction without residual deficits: Secondary | ICD-10-CM | POA: Insufficient documentation

## 2022-03-28 DIAGNOSIS — I441 Atrioventricular block, second degree: Secondary | ICD-10-CM | POA: Insufficient documentation

## 2022-03-28 DIAGNOSIS — Z6841 Body Mass Index (BMI) 40.0 and over, adult: Secondary | ICD-10-CM | POA: Diagnosis not present

## 2022-03-28 DIAGNOSIS — E119 Type 2 diabetes mellitus without complications: Secondary | ICD-10-CM | POA: Diagnosis not present

## 2022-03-28 DIAGNOSIS — Z86711 Personal history of pulmonary embolism: Secondary | ICD-10-CM | POA: Insufficient documentation

## 2022-03-28 DIAGNOSIS — I110 Hypertensive heart disease with heart failure: Secondary | ICD-10-CM | POA: Diagnosis not present

## 2022-03-28 DIAGNOSIS — I3139 Other pericardial effusion (noninflammatory): Secondary | ICD-10-CM | POA: Diagnosis not present

## 2022-03-28 DIAGNOSIS — E785 Hyperlipidemia, unspecified: Secondary | ICD-10-CM | POA: Insufficient documentation

## 2022-03-28 DIAGNOSIS — Z7901 Long term (current) use of anticoagulants: Secondary | ICD-10-CM | POA: Insufficient documentation

## 2022-03-28 DIAGNOSIS — M79605 Pain in left leg: Secondary | ICD-10-CM | POA: Diagnosis not present

## 2022-03-28 DIAGNOSIS — I5022 Chronic systolic (congestive) heart failure: Secondary | ICD-10-CM | POA: Insufficient documentation

## 2022-03-28 DIAGNOSIS — Z8249 Family history of ischemic heart disease and other diseases of the circulatory system: Secondary | ICD-10-CM | POA: Insufficient documentation

## 2022-03-28 DIAGNOSIS — I240 Acute coronary thrombosis not resulting in myocardial infarction: Secondary | ICD-10-CM

## 2022-03-28 DIAGNOSIS — Z7984 Long term (current) use of oral hypoglycemic drugs: Secondary | ICD-10-CM | POA: Diagnosis not present

## 2022-03-28 DIAGNOSIS — E611 Iron deficiency: Secondary | ICD-10-CM | POA: Diagnosis not present

## 2022-03-28 LAB — CBC
HCT: 53.3 % — ABNORMAL HIGH (ref 39.0–52.0)
Hemoglobin: 17.3 g/dL — ABNORMAL HIGH (ref 13.0–17.0)
MCH: 29.1 pg (ref 26.0–34.0)
MCHC: 32.5 g/dL (ref 30.0–36.0)
MCV: 89.6 fL (ref 80.0–100.0)
Platelets: 329 10*3/uL (ref 150–400)
RBC: 5.95 MIL/uL — ABNORMAL HIGH (ref 4.22–5.81)
RDW: 13.7 % (ref 11.5–15.5)
WBC: 5 10*3/uL (ref 4.0–10.5)
nRBC: 0 % (ref 0.0–0.2)

## 2022-03-28 LAB — COMPREHENSIVE METABOLIC PANEL
ALT: 73 U/L — ABNORMAL HIGH (ref 0–44)
AST: 27 U/L (ref 15–41)
Albumin: 3.1 g/dL — ABNORMAL LOW (ref 3.5–5.0)
Alkaline Phosphatase: 96 U/L (ref 38–126)
Anion gap: 10 (ref 5–15)
BUN: 13 mg/dL (ref 6–20)
CO2: 20 mmol/L — ABNORMAL LOW (ref 22–32)
Calcium: 9.3 mg/dL (ref 8.9–10.3)
Chloride: 105 mmol/L (ref 98–111)
Creatinine, Ser: 0.94 mg/dL (ref 0.61–1.24)
GFR, Estimated: >60 mL/min (ref >60)
Glucose, Bld: 133 mg/dL — ABNORMAL HIGH (ref 70–99)
Potassium: 4.4 mmol/L (ref 3.5–5.1)
Sodium: 135 mmol/L (ref 135–145)
Total Bilirubin: 0.8 mg/dL (ref 0.3–1.2)
Total Protein: 7.6 g/dL (ref 6.5–8.1)

## 2022-03-28 LAB — DIGOXIN LEVEL: Digoxin Level: <0.2 ng/mL — ABNORMAL LOW (ref 0.8–2.0)

## 2022-03-28 MED ORDER — ENTRESTO 49-51 MG PO TABS: 1.0000 | ORAL_TABLET | Freq: Two times a day (BID) | ORAL | 6 refills | Status: DC

## 2022-03-28 NOTE — Progress Notes (Signed)
ReDS Vest / Clip - 03/28/22 1000   ? ?  ? ReDS Vest / Clip  ? Station Marker D   ? Ruler Value 38   ? ReDS Value Range Low volume   ? ReDS Actual Value 34   ? ?  ?  ? ?  ? ? ?

## 2022-03-28 NOTE — Progress Notes (Signed)
? ?ADVANCED HF CLINIC CONSULT NOTE ? ?Primary Care: Ellene Route ?HF Cardiologist: Dr. Haroldine Laws ? ?HPI: ?Robert Mccann is a 26 y.o. 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 ? ?Admitted 6/22 to [facility] with PE. Underwent thrombectomy/thrombolysis of the right upper/middle/lower lobes. Treated with heparin and transitioned to Eliquis. Dopplers negative for DVT though it was noted that the patient previously spent 12 hours each way to [place] and [place] and back about a week or 2 prior to admission. Echo showed EF of less than 20% with global hypokinesis and severely reduced RV function. 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 Noted to have intermittent Mobitz 2 heart block during periods of sleep with recommendation for outpatient sleep study. Started on GDMT and discharged home.  ?  ?Admitted 10/2021 with dysarthria and left hand numbness/weakness. Found to have a large acute posterior right MCA territory infarct. TEE did not show any LA/LAA/LV thrombus, and bubble study was negative.  He reported compliance with Eliquis, which was held in the setting of areas of petechial hemorrhage on follow-up CT.  We discussed potentially switching to warfarin however, patient preferred to remain on Eliquis, and this was resumed once cleared by neurology.  Unable to complete coronary CTA due to inability to maintain adequate heart rate. ?  ?Admitted to [facility] 3/23 with CP and SOB. CT chest with small LUL PE, RA clot. ECHO EF <20% severe RV dysfunction large LV clot. Transferred to [facility] for further management. No R/LHC with RA and LV clot. PICC placed, milrinone and lasix started. cMRI with LVEF 11%, RVEF 19%, large LV thrombus, criteria for LV non-compaction met, subendocardial LGE which can be seen in CAD and noncompaction, moderate pericardial effusion, moderate TR,  mild MR. Milrinone weaned and GDMT titrated, no beta blocker with wenckebach AV block. Not currently candidate for advanced therapies due to size and noncompliance. Discharged home, weight 309 lbs. ? ?Today he returns for HF follow up with wife. Overall feeling fine. Noticed weight is steadily going up. No SOB with walking or carrying groceries. Napping more, and tired throughout the day. + PND. Has left leg pain/numbness with walking. Denies palpitations, abnormal bleeding, CP, dizziness, or edema.  Appetite ok. No fever or chills. Weight at home 304-306 pounds, this AM he was at 315 lbs. Taking all medications, including Eliquis. Will start taking Ozempic tomorrow. Eating salty foods at home. Starting process for disability.  ? ?Cardiac Studies: ?cMRI (3/23): with LVEF 11%, RVEF 19%, large LV thrombus, criteria for LV non-compaction met, subendocardial LGE which can be seen in CAD and noncompaction, moderate pericardial effusion, moderate TR, mild MR.  ? ?- Echo (3/23): EF <20% severe RV dysfunction large LV clot. ? ?- TEE (11/22): EF 20-25%, global HK LV, severe RV dysfunction, no clot, bubble study negative. ? ?- Echo (6/22): EF < 20% with global hypokinesis and severely reduced RV function. ? ?Review of Systems: [y] = yes, _0  = no  ? ?General: Weight gain Blue.Reese ]; Weight loss _1 ; Anorexia _2 ; Fatigue _3 ; Fever _4 ; Chills _5 ; Weakness _6   ?Cardiac: Chest pain/pressure _7 ; Resting SOB _8 ; Exertional SOB _9 ; Orthopnea _10 ; Pedal Edema _11 ; Palpitations _12 ; Syncope _13 ; Presyncope _14 ; Paroxysmal nocturnal dyspnea_15   ?Pulmonary: Cough _16 ; Wheezing_17 ; Hemoptysis_18 ; Sputum _19 ; Snoring Blue.Reese ]  ?GI:  Vomiting_0 ; Dysphagia_1 ; Melena_2 ; Hematochezia _3 ; Heartburn_4 ; Abdominal pain _5 ; Constipation _6 ; Diarrhea _7 ; BRBPR _8   ?GU: Hematuria_9 ; Dysuria _10 ; Nocturia_11   ?Vascular: Pain in legs with walking [ y]; Pain in feet with lying flat _12 ; Non-healing sores _13 ; Stroke Blue.Reese ]; TIA _14 ; Slurred speech  _15 ;  ?Neuro: Headaches_16 ; Vertigo_17 ; Seizures_18 ; Paresthesias_19 ;Blurred vision _20 ; Diplopia _21 ; Vision changes _22   ?Ortho/Skin: Arthritis _23 ; Joint pain _24 ; Muscle pain _25 ; Joint swelling _26 ; Back Pain _27 ; Rash _28   ?Psych: Depression_29 ; Anxiety_30   ?Heme: Bleeding problems _31 ; Clotting disorders _32 ; Anemia _33   ?Endocrine: Diabetes Blue.Reese ]; Thyroid dysfunction_34  ? ?Past Medical History:  ?Diagnosis Date  ? 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 a. 05/2021 noted during periods of sleep @ time of hospitalization for PE.  ? Cardiomyopathy ([place])   ? a. 05/2021 Echo: EF <20%, glob HK. [place]:C ratio of 2.1:1 suggesting non-compaction. Gr2 DD. Sev reduced RV fxn. Nl PASP. Mildly dil RA. Mild MR; b. 10/2021 Echo: EF <20%, GrII DD; c. 10/2021 TEE: EF 20-25%, glob HK, sev red RV fxn.  ? Diabetes mellitus without complication ([place])   ? HFrEF (heart failure with reduced ejection fraction) ([place])   ? a. 05/2021 Echo: EF <20%. ? non-compaction.  ? Hypertension   ? Pulmonary embolism ([place])   ? a. 05/2021 CTA Chest: PE extending from lobar arteris of RUL and RLL w/ concern for pulml infarct-->s/p thrombolysis and thrombectomy of RUL/RML/RLL.  ? Stroke [facility])   ? a. 10/2021 CT Head: Bilat cerebral infarcts w/ mod-sided acute to subacute R temporoparietal infarct; b. 10/2021 MRI: R post PCA territory infarcts w/o hemorrhage; c. 10/2021 TEE: Neg bubble study. No LA/LAA thrombus. EF 20-25%, glob HK.  ? ?Current Outpatient Medications  ?Medication Sig Dispense Refill  ? apixaban (ELIQUIS) 5 MG TABS tablet Take 1 tablet (5 mg total) by mouth 2 (two) times daily. 60 tablet 0  ? Blood Glucose Monitoring Suppl (BLOOD GLUCOSE MONITOR SYSTEM) w/Device KIT Use up to four times daily as directed. 1 kit 3  ? Blood Glucose Monitoring Suppl (CONTOUR NEXT ONE) DEVI Use up to four times daily as directed 1 each 0  ? dapagliflozin propanediol (FARXIGA) 10 MG TABS tablet Take 1 tablet (10 mg total) by mouth daily. 30 tablet 0  ? digoxin (LANOXIN) 0.125 MG  tablet Take 1 tablet (0.125 mg total) by mouth daily. 30 tablet 0  ? furosemide (LASIX) 40 MG tablet Take 1 tablet (40 mg total) by mouth daily as needed for edema or fluid (take in case of weight gain 3 lbs in 24 to 48 hrs or 5 lbs in 7 days, leg swelling or shortness of breath). 30 tablet 0  ? glucose blood (CONTOUR NEXT TEST) test strip Use as directed up to 4 times daily. 100 each 0  ? metFORMIN (GLUCOPHAGE) 500 MG tablet Take 1 tablet (500 mg total) by mouth 2 (two) times daily with a meal. 60 tablet 0  ? potassium chloride SA (KLOR-CON M) 20 MEQ tablet Take 2 tablets (40 mEq total) by mouth daily as needed (take with furosemide.). 60 tablet 0  ? sacubitril-valsartan (ENTRESTO) 49-51 MG Take 1 tablet by mouth 2 (two) times daily. 60 tablet 6  ? spironolactone (ALDACTONE) 25 MG tablet Take 1 tablet (25 mg total) by mouth daily. 30 tablet  0  ? Semaglutide,0.25 or 0.5MG/DOS, (OZEMPIC, 0.25 OR 0.5 MG/DOSE,) 2 MG/1.5ML SOPN Inject 0.25 mg into the skin once a week. (Patient not taking: Reported on 03/28/2022) 1.5 mL 0  ? ?No current facility-administered medications for this encounter.  ? ?No Known Allergies ? ?Social History  ? ?Socioeconomic History  ? Marital status: Single  ?  Spouse name: Not on file  ? Number of children: Not on file  ? Years of education: Not on file  ? Highest education level: Not on file  ?Occupational History  ? Not on file  ?Tobacco Use  ? Smoking status: Never  ? Smokeless tobacco: Never  ?Vaping Use  ? Vaping Use: Never used  ?Substance and Sexual Activity  ? Alcohol use: Not Currently  ?  Comment: special occasions  ? Drug use: Not Currently  ?  Types: Marijuana  ?  Comment: last month  ? Sexual activity: Yes  ?  Partners: Female  ?Other Topics Concern  ? Not on file  ?Social History Narrative  ? Not on file  ? ?Social Determinants of Health  ? ?Financial Resource Strain: Not on file  ?Food Insecurity: Not on file  ?Transportation Needs: Not on file  ?Physical Activity: Not on file   ?Stress: Not on file  ?Social Connections: Not on file  ?Intimate Partner Violence: Not on file  ? ?Family History  ?Problem Relation Age of Onset  ? Heart failure Mother   ? Cerebrovascular Accident Mother   ?

## 2022-03-28 NOTE — Patient Instructions (Signed)
Thank you for coming in today  ? ?Labs were done today, if any labs are abnormal the clinic will call you ? ?INCREASE Entresto to 49/51 mg 1 tablet twice daily  ? ?You will be scheduled for a CT angio of the coronaries  ? ?Your physician recommends that you schedule a follow-up appointment in:  ?1 month with Dr. Gala Romney with echocardiogram ? ?Your physician has requested that you have an echocardiogram. Echocardiography is a painless test that uses sound waves to create images of your heart. It provides your doctor with information about the size and shape of your heart and how well your heart?s chambers and valves are working. This procedure takes approximately one hour. There are no restrictions for this procedure. ?  ? ?At the [facility], you and your health needs are our priority. As part of our continuing mission to provide you with exceptional heart care, we have created designated Provider Care Teams. These Care Teams include your primary Cardiologist (physician) and Advanced Practice Providers (APPs- Physician Assistants and Nurse Practitioners) who all work together to provide you with the care you need, when you need it.  ? ?You may see any of the following providers on your designated Care Team at your next follow up: ?Dr Arvilla Meres ?Dr Marca Ancona ?Tonye Becket, NP ?Robbie Lis, PA ?Jessica Milford,NP ?Anna Genre, PA ?Karle Plumber, PharmD ? ? ?Please be sure to bring in all your medications bottles to every appointment.  ? ?If you have any questions or concerns before your next appointment please send [place] a message through [place] or call our office at [phone].   ? ?TO LEAVE A MESSAGE FOR THE NURSE SELECT OPTION 2, PLEASE LEAVE A MESSAGE INCLUDING: ?YOUR NAME ?DATE OF BIRTH ?CALL BACK NUMBER ?REASON FOR CALL**this is important as we prioritize the call backs ? ?YOU WILL RECEIVE A CALL BACK THE SAME DAY AS LONG AS YOU CALL BEFORE 4:00 PM ? ? ?

## 2022-03-28 NOTE — Telephone Encounter (Signed)
Transitions of Care Pharmacy  ° °Call attempted for a pharmacy transitions of care follow-up. HIPAA appropriate voicemail was left with call back information provided.  ° °Call attempt #1. Will follow-up in 2-3 days.  °  °

## 2022-03-29 ENCOUNTER — Ambulatory Visit: Payer: 59 | Admitting: Pharmacist

## 2022-03-29 VITALS — Wt 316.0 lb

## 2022-03-29 DIAGNOSIS — E782 Mixed hyperlipidemia: Secondary | ICD-10-CM | POA: Diagnosis not present

## 2022-03-29 DIAGNOSIS — E785 Hyperlipidemia, unspecified: Secondary | ICD-10-CM

## 2022-03-29 DIAGNOSIS — E66813 Obesity, class 3: Secondary | ICD-10-CM

## 2022-03-29 DIAGNOSIS — E1169 Type 2 diabetes mellitus with other specified complication: Secondary | ICD-10-CM

## 2022-03-29 MED ORDER — POTASSIUM CHLORIDE 20 MEQ PO PACK: 40.0000 meq | PACK | Freq: Every day | ORAL | 3 refills | Status: DC | PRN

## 2022-03-29 MED ORDER — ROSUVASTATIN CALCIUM 40 MG PO TABS: 40.0000 mg | ORAL_TABLET | Freq: Every day | ORAL | 3 refills | Status: DC

## 2022-03-29 NOTE — Progress Notes (Signed)
Patient ID: Robert Mccann                 DOB: Oct 16, 1996                    MRN: [id number] ? ? ? ? ?HPI: ?Robert Mccann is a 26 y.o. male patient referred to lipid clinic after recent hospital discharge for lipid and weight loss management. 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 EF most recently 11% on cMRI 02/2022. ? ?Also had shocked liver when in hospital, LFTs trending down significantly when checked at CHF clinic yesterday - ALT peak of 1,733 down to 73, AST peak of 3,970 down to 27.  ? ?Pt presents today for follow up. Only took atorvastatin for a month then stopped because he didn't have any refills on the rx. Brings in [employer] that he was prescribed at discharge. Now has [employer] insurance - ? ?ID: [id number] ?BIN: [id number] ?PCN: [id number] ?GRP: [id number] ? ?Having trouble swallowing his potassium pills. Also mentioned he was supposed to get a [place] copay card yesterday but didn't. ? ?On metformin 531m BID - diarrhea on 10056mBID. Fasting glucose readings at home in the 160s. ? ?Current Medications: none ?Intolerances: none ?Risk Factors: CVA, T2DM, HTN, HF, obesity ?LDL goal: <5567mL ? ?Diet: 2 meals a day. Eggs and bacon this morning, likes Gre[employer] Cowdenchine drinks ? ?Exercise: Starting cardiac rehab ? ?Family History: Mother with HF and CVA, father with DM and HTN. ? ?Social History: Marijuana use, rarely drinks alcohol, denies tobacco use. ? ?Labs: ?11/06/21: TC 231, TG 143, HDL 51, LDL 151 (no LLT) ?03/13/22: A1c 8.9% ? ?Past Medical History:  ?Diagnosis Date  ? 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 a. 05/2021 noted during periods of sleep @ time of hospitalization for PE.  ? Cardiomyopathy ([facility] ? a. 05/2021 Echo: EF <20%, glob HK. 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 Gr2 DD. Sev reduced RV fxn. Nl PASP. Mildly dil RA.  Mild MR; b. 10/2021 Echo: EF <20%, GrII DD; c. 10/2021 TEE: EF 20-25%, glob HK, sev red RV fxn.  ? Diabetes mellitus without complication ([facility] ? HFrEF (heart failure with reduced ejection fraction) ([facility] ? a. 05/2021 Echo: EF <20%. ? non-compaction.  ? Hypertension   ? Pulmonary embolism ([facility] ? a. 05/2021 CTA Chest: PE extending from lobar arteris of RUL and RLL w/ concern for pulml infarct-->s/p thrombolysis and thrombectomy of RUL/RML/RLL.  ? [facility] ? a. 10/2021 CT Head: Bilat cerebral infarcts w/ mod-sided acute to subacute R temporoparietal infarct; b. 10/2021 MRI: R post PCA territory infarcts w/o hemorrhage; c. 10/2021 TEE: Neg bubble study. No LA/LAA thrombus. EF 20-25%, glob HK.  ? ? ?Current Outpatient Medications on File Prior to Visit  ?Medication Sig Dispense Refill  ? apixaban (ELIQUIS) 5 MG TABS tablet Take 1 tablet (5 mg total) by mouth 2 (two) times daily. 60 tablet 0  ? Blood Glucose Monitoring Suppl (BLOOD GLUCOSE MONITOR SYSTEM) w/Device KIT Use up to four times daily as directed. 1 kit 3  ? Blood Glucose Monitoring Suppl (CONTOUR NEXT ONE) DEVI Use up to four times daily as directed 1 each 0  ? dapagliflozin propanediol (FARXIGA) 10 MG TABS tablet Take 1 tablet (10 mg total) by mouth daily. 30[place]  tablet 0  ? digoxin (LANOXIN) 0.125 MG tablet Take 1 tablet (0.125 mg total) by mouth daily. 30 tablet 0  ? furosemide (LASIX) 40 MG tablet Take 1 tablet (40 mg total) by mouth daily as needed for edema or fluid (take in case of weight gain 3 lbs in 24 to 48 hrs or 5 lbs in 7 days, leg swelling or shortness of breath). 30 tablet 0  ? glucose blood (CONTOUR NEXT TEST) test strip Use as directed up to 4 times daily. 100 each 0  ? metFORMIN (GLUCOPHAGE) 500 MG tablet Take 1 tablet (500 mg total) by mouth 2 (two) times daily with a meal. 60 tablet 0  ? potassium chloride SA (KLOR-CON M) 20 MEQ tablet Take 2 tablets (40 mEq total) by mouth daily as needed (take with furosemide.). 60 tablet 0  ?  sacubitril-valsartan (ENTRESTO) 49-51 MG Take 1 tablet by mouth 2 (two) times daily. 60 tablet 6  ? Semaglutide,0.25 or 0.5MG/DOS, (OZEMPIC, 0.25 OR 0.5 MG/DOSE,) 2 MG/1.5ML SOPN Inject 0.25 mg into the skin once a week. (Patient not taking: Reported on 03/28/2022) 1.5 mL 0  ? spironolactone (ALDACTONE) 25 MG tablet Take 1 tablet (25 mg total) by mouth daily. 30 tablet 0  ? ?No current facility-administered medications on file prior to visit.  ? ? ?No Known Allergies ? ?Assessment/Plan: ? ?1. Hyperlipidemia - LDL 151 on no LLT above goal < 55 given hx of CVA and DM. LFTs have generally normalized since recent hospitalization. Will start rosuvastatin 16m daily and recheck lipids and LFTs in 1 month. ? ?2. DM/weight loss - A1c 8.9% above goal < 7%. Currently taking metformin 5063mBID and Farxiga 1066maily (also for his CHF). Will start Ozempic 0.16m76mce weekly. Pt will continue to monitor glucose at home. Will call pt in 1 month for further dose titration. ? ?Provided pt with copay cards for his Ozempic and [place]. Already has Ozempic PA previously approved through 03/20/23. ? ?Also changed his potassium pills to the packets to dissolve in water as he's unable to swallow the pills. ? ?Robert Mccann E. Anyi Fels, PharmD, BCACP, CPP ?[facility] [id number]: [phone]: [phone] [id number]/2023 12:03 PM ? ? ? ?

## 2022-03-29 NOTE — Patient Instructions (Addendum)
Cholesterol: ?Your LDL (bad) cholesterol is 151 and your goal is < 55 ?Start rosuvastatin 40mg  - 1 tablet once daily ?Recheck fasting cholesterol on Monday May 1st any time after 7:30am ? ?Diabetes/weight: ?Your A1c is 8.9% and your goal is < 7% ? ?Ozempic Counseling Points ?This medication reduces your appetite and may make you feel fuller longer.  ?Stop eating when your body tells you that you are full. This will likely happen sooner than you are used to. ?Store your medication in the fridge until you are ready to use it. ?Inject your medication in the fatty tissue of your lower abdominal area (2 inches away from belly button) or upper outer thigh. Rotate injection sites. ?Each pen will last you about 1 month (the first month it will last a few weeks longer). Use a different needle with each weekly injection. ?Common side effects include: nausea, diarrhea/constipation, and heartburn, and are more likely to occur if you overeat. ? ?Dosing schedule: ?- Month 1: Inject Ozempic 0.25mg  subcutaneously once weekly for 4 weeks ?- Month 2: Inject Ozempic 0.5mg  subcutaneously once weekly for 4 weeks ?- Month 3: Inject 1mg  subcutaneously once weekly for 4 weeks ?- Month 4+: Inject 2mg  subcutaneously once weekly ? ?Tips for living a healthier life ? ? ? ? ?Building a May 3 Diet ?Make most of your meal vegetables and fruits - ? of your plate. ?Aim for color and variety, and remember that potatoes don?t count as vegetables on the Healthy Eating Plate because of their negative impact on blood sugar. ? ?Go for whole grains - ? of your plate. ?Whole and intact grains--whole wheat, barley, wheat berries, quinoa, oats, brown rice, and foods made with them, such as whole wheat pasta--have a milder effect on blood sugar and insulin than white bread, white rice, and other refined grains. ? ?Protein power - ? of your plate. ?Fish, poultry, beans, and nuts are all healthy, versatile protein sources--they can be mixed into  salads, and pair well with vegetables on a plate. Limit red meat, and avoid processed meats such as bacon and sausage. ? ?Healthy plant oils - in moderation. ?Choose healthy vegetable oils like olive, canola, soy, corn, sunflower, peanut, and others, and avoid partially hydrogenated oils, which contain unhealthy trans fats. Remember that low-fat does not mean ?healthy.? ? ?Drink water, coffee, or tea. ?Skip sugary drinks, limit milk and dairy products to one to two servings per day, and limit juice to a small glass per day. ? ?Stay active. ?The red figure running across the Healthy Eating Plate?s placemat is a reminder that staying active is also important in weight control. ? ?The main message of the Healthy Eating Plate is to focus on diet quality: ? ?The type of carbohydrate in the diet is more important than the amount of carbohydrate in the diet, because some sources of carbohydrate--like vegetables (other than potatoes), fruits, whole grains, and beans--are healthier than others. ?The Healthy Eating Plate also advises consumers to avoid sugary beverages, a major source of calories--usually with little nutritional value--in the American diet. ?The Healthy Eating Plate encourages consumers to use healthy oils, and it does not set a maximum on the percentage of calories people should get each day from healthy sources of fat. In this way, the Healthy Eating Plate recommends the opposite of the low-fat message promoted for decades by the USDA. ? ? ? ?SUGAR ? ?Sugar is a huge problem in the modern day diet. Sugar is a big contributor to heart disease,  diabetes, high triglyceride levels, fatty liver disease and obesity. Sugar is hidden in almost all packaged foods/beverages. Added sugar is extra sugar that is added beyond what is naturally found and has no nutritional benefit for your body. The American Heart Association recommends limiting added  sugars to no more than 25g for women and 36 grams for men per day. There are many names for sugar including maltose, sucrose (names ending in "ose"), high fructose corn syrup, molasses, cane sugar, corn sweetener, raw sugar, syrup, honey or fruit juice concentrate.  ? ?One of the best ways to limit your added sugars is to stop drinking sweetened beverages such as soda, sweet tea, and fruit juice. ? ?There is 65g of added sugars in one 20oz bottle of Coke! That is equal to 7.5 donuts.  ? ?Pay attention and read all nutrition facts labels. Below is an examples of a nutrition facts label. The #1 is showing you the total sugars where the # 2 is showing you the added sugars. This one serving has almost the max amount of added sugars per day! ? ? ? ? ?20 oz Soda ?65g Sugar = 7.5 Glazed Donuts ? ?16oz Energy  ?Drink ?54g Sugar = 6.5 Glazed Donuts ? ?Large Sweet  ?Tea ?38g Sugar = 4 Glazed Donuts ? ?20oz Sports  ?Drink ?34g Sugar = 3.5 Glazed Donuts ? ?8oz Chocolate Milk ?24g Sugar =2.5 Glazed Donuts ? ?8oz Orange  ?Juice ?21g Sugar = 2 Glazed Donuts ? ?1 Juice Box ?14g Sugar = 1.5 Glazed Donuts ? ?16oz Water= NO SUGAR!! ? ?EXERCISE ? ?Exercise is good. We?ve all heard that. In an ideal world, we would all have time and resources to get plenty of it. When you are active, your heart pumps more efficiently and you will feel better.  Multiple studies show that even walking regularly has benefits that include living a longer life. The American Heart Association recommends 150 minutes per week of exercise (30 minutes per day most days of the week). You can do this in any increment you wish. Nine or more 10-minute walks count. So does an hour-long exercise class. Break the time apart into what will work in your life. Some of the best things you can do include walking briskly, jogging, cycling or swimming laps. Not everyone is ready to ?exercise.? Sometimes we need to start with just getting active. Here are some easy ways to be  more active throughout the day: ? Take the stairs instead of the elevator ? Go for a 10-15 minute walk during your lunch break (find a friend to make it more enjoyable) ? When shopping, park at the back of the parking lot ? If you take public transportation, get off one stop early and walk the extra distance ? Pace around while making phone calls ? ?Check with your doctor if you aren?t sure what your limitations may be. Always remember to drink plenty of water when doing any type of exercise. Don?t feel like a failure if you?re not getting the 90-150 minutes per week. If you started by being a couch potato, then just a 10-minute walk each day is a huge improvement. Start with little victories and work your way up. ? ? ?HEALTHY EATING TIPS ? ?When looking to improve your eating habits, whether to lose weight, lower blood pressure or just be healthier, it helps to know what a serving size is.  ? ?Grains ?1 slice of bread, ? bagel, ? cup pasta or rice  Vegetables ?1 cup  fresh or raw vegetables, ? cup cooked or canned ?Fruits ?1 piece of medium sized fruit, ? cup canned,   Meats/Proteins ?? cup dried       1 oz meat, 1 egg, ? cup cooked beans, nuts or seeds ? ?Dairy        Fats ?Individual yogurt container, 1 cup (8oz)    1 teaspoon margarine/butter or vegetable  ?milk or milk alternative, 1 slice of cheese          oil; 1 tablespoon mayonnaise or salad dressing                 ? ?Plan ahead: make a menu of the meals for a week then create a grocery list to go with that menu. Consider meals that easily stretch into a night of leftovers, such as stews or casseroles. Or consider making two of your favorite meal and put one in the freezer for another night. Try a night or two each week that is ?meatless? or ?no cook? such as salads. When you get home from the grocery store wash and prepare your vegetables and fruits. Then when you need them they are ready to go.  ? ?Tips for going to the grocery store: ? [place] store or  generic brands ? Check the weekly ad from your store on-line or in their in-store flyer ? Look at the unit price on the shelf tag to compare/contrast the costs of different items ? Buy fruits/vegetables in [place]

## 2022-04-03 ENCOUNTER — Encounter (HOSPITAL_COMMUNITY): Payer: 59

## 2022-04-04 ENCOUNTER — Encounter (HOSPITAL_COMMUNITY): Payer: Self-pay | Admitting: Internal Medicine

## 2022-04-05 ENCOUNTER — Telehealth (HOSPITAL_COMMUNITY): Payer: Self-pay

## 2022-04-05 ENCOUNTER — Other Ambulatory Visit (HOSPITAL_COMMUNITY): Payer: Self-pay

## 2022-04-05 NOTE — Telephone Encounter (Signed)
Call #2 attempted for TOC pharmacy follow-up. Left HIPAA appropriate VM. Will follow-up in 2-3 days. ?

## 2022-04-06 ENCOUNTER — Other Ambulatory Visit (HOSPITAL_COMMUNITY): Payer: Self-pay

## 2022-04-06 ENCOUNTER — Telehealth (HOSPITAL_COMMUNITY): Payer: Self-pay

## 2022-04-06 NOTE — Telephone Encounter (Signed)
Pharmacy Transitions of Care Follow-up Telephone Call ? ?Date of discharge: 03/21/22  ?Discharge Diagnosis: Acute PE ? ?How have you been since you were released from the hospital? Patient doing well since discharge, no questions about meds at this time.  ? ?Medication changes made at discharge: ?START taking: ?Contour Next One  ?Blood Glucose Monitor System  ?Contour Next Test (glucose blood)  ?digoxin (LANOXIN)  ?Eliquis (apixaban)  ?Farxiga (dapagliflozin propanediol)  ?furosemide (LASIX)  ?This replaces a similar medication. See the full medication list for instructions. ?metFORMIN (GLUCOPHAGE)  ?Ozempic (0.25 or 0.5 MG/DOSE) (Semaglutide(0.25 or 0.5MG /DOS))  ?spironolactone (ALDACTONE)  ?Icon medications to stop taking   STOP taking: ?carvedilol 3.125 MG tablet (COREG)  ?furosemide 10 MG/ML injection (LASIX)  ?Replaced by a similar medication. ?heparin 25000 UT/250ML infusion  ? ?Medication changes verified by the patient? Yes ?  ? ?Medication Accessibility: ? ?Home Pharmacy: Not discussed  ? ?Was the patient provided with refills on discharged medications? No  ? ?Have all prescriptions been transferred from [facility] to home pharmacy? N/A  ? ?Is the patient able to afford medications? No insurance, patient will need assistance on refills ?  ? ?Medication Review: ? ?Patient confirmed they received counseling on Eliquis in hospital. ?APIXABAN (ELIQUIS)  ?Apixaban 10 mg BID initiated on 03/13/22. Will switch to apixaban 5 mg BID after 7 days (DATE 03/20/22). Was discharged on Eliquis 5mg  BID. ?- Discussed importance of taking medication around the same time everyday  ?- Advised patient of medications to avoid (NSAIDs, ASA)  ?- Educated that Tylenol (acetaminophen) will be the preferred analgesic to prevent risk of bleeding  ?- Emphasized importance of monitoring for signs and symptoms of bleeding (abnormal bruising, prolonged bleeding, nose bleeds, bleeding from gums, discolored urine, black tarry stools)  ?- Advised  patient to alert all providers of anticoagulation therapy prior to starting a new medication or having a procedure  ? ?Follow-up Appointments: ? ?[facility] f/u appt confirmed? Scheduled to see Dr. Haroldine Laws on 04/25/22 @ 11:20am.  ? ?If their condition worsens, is the pt aware to call PCP or go to the Emergency Dept.? Yes ? ?Final Patient Assessment: ?Patient has f/u scheduled and knows to get refills at f/u ? ?

## 2022-04-12 ENCOUNTER — Telehealth (HOSPITAL_COMMUNITY): Payer: Self-pay

## 2022-04-12 ENCOUNTER — Telehealth (HOSPITAL_COMMUNITY): Payer: Self-pay | Admitting: *Deleted

## 2022-04-12 NOTE — Telephone Encounter (Addendum)
Spoke with patient and gave him instructions per Amy.. ? ?Pt aware, agreeable, and verbalized understanding ? ?Please call and ask him to take 40 mg lasix daily x 3 days then cut back to 20 mg daily.  ? ?Check BMET in 7 days.  ?Instuct to weigh and record daily.  ? ?Amy Clegg NP-C  ?3:36 PM   ? ?Patient showed up at front desk with paper work but also informed front desk that he has gained 10 lb in 3 days. He is wondering if he should take extra lasix  ?

## 2022-04-12 NOTE — Telephone Encounter (Signed)
Auth request for cardiac ct faxed to [employer]  ?

## 2022-04-16 ENCOUNTER — Encounter: Payer: 59 | Attending: Internal Medicine | Admitting: *Deleted

## 2022-04-16 DIAGNOSIS — I5022 Chronic systolic (congestive) heart failure: Secondary | ICD-10-CM

## 2022-04-16 DIAGNOSIS — I5043 Acute on chronic combined systolic (congestive) and diastolic (congestive) heart failure: Secondary | ICD-10-CM | POA: Insufficient documentation

## 2022-04-16 NOTE — Progress Notes (Signed)
Initial telephone orientation completed. Diagnosis can be found in [facility] [place] 3/30. EP orientation scheduled for Thursday 4/30 at 9:30. ?

## 2022-04-17 ENCOUNTER — Encounter (HOSPITAL_COMMUNITY): Payer: Self-pay | Admitting: *Deleted

## 2022-04-19 VITALS — Ht 71.0 in | Wt 321.2 lb

## 2022-04-19 DIAGNOSIS — I5043 Acute on chronic combined systolic (congestive) and diastolic (congestive) heart failure: Secondary | ICD-10-CM | POA: Diagnosis present

## 2022-04-19 DIAGNOSIS — I5022 Chronic systolic (congestive) heart failure: Secondary | ICD-10-CM

## 2022-04-19 NOTE — Progress Notes (Addendum)
Cardiac Individual Treatment Plan ? ?Patient Details  ?Name: Robert Mccann ?MRN: [id number] ?Date of Birth: 1996-06-07 ?Referring Provider:   ?Flowsheet Row Cardiac Rehab from 04/19/2022 in [facility] Cardiac and Pulmonary Rehab  ?Referring Provider Glori Bickers MD  ? ?  ? ? ?Initial Encounter Date:  ?Flowsheet Row Cardiac Rehab from 04/19/2022 in [facility] Cardiac and Pulmonary Rehab  ?Date 04/19/22  ? ?  ? ? ?Visit Diagnosis: Heart failure, chronic systolic (HCC) ? ?Patient's Home Medications on Admission: ? ?Current Outpatient Medications:  ?  apixaban (ELIQUIS) 5 MG TABS tablet, Take 1 tablet (5 mg total) by mouth 2 (two) times daily., Disp: 60 tablet, Rfl: 0 ?  Blood Glucose Monitoring Suppl (BLOOD GLUCOSE MONITOR SYSTEM) w/Device KIT, Use up to four times daily as directed., Disp: 1 kit, Rfl: 3 ?  Blood Glucose Monitoring Suppl (CONTOUR NEXT ONE) DEVI, Use up to four times daily as directed, Disp: 1 each, Rfl: 0 ?  dapagliflozin propanediol (FARXIGA) 10 MG TABS tablet, Take 1 tablet (10 mg total) by mouth daily., Disp: 30 tablet, Rfl: 0 ?  digoxin (LANOXIN) 0.125 MG tablet, Take 1 tablet (0.125 mg total) by mouth daily., Disp: 30 tablet, Rfl: 0 ?  furosemide (LASIX) 40 MG tablet, Take 1 tablet (40 mg total) by mouth daily as needed for edema or fluid (take in case of weight gain 3 lbs in 24 to 48 hrs or 5 lbs in 7 days, leg swelling or shortness of breath)., Disp: 30 tablet, Rfl: 0 ?  glucose blood (CONTOUR NEXT TEST) test strip, Use as directed up to 4 times daily., Disp: 100 each, Rfl: 0 ?  metFORMIN (GLUCOPHAGE) 500 MG tablet, Take 1 tablet (500 mg total) by mouth 2 (two) times daily with a meal., Disp: 60 tablet, Rfl: 0 ?  potassium chloride (KLOR-CON) 20 MEQ packet, Take 40 mEq by mouth daily as needed. Take on days you take Lasix., Disp: 100 packet, Rfl: 3 ?  rosuvastatin (CRESTOR) 40 MG tablet, Take 1 tablet (40 mg total) by mouth daily., Disp: 90 tablet, Rfl: 3 ?  sacubitril-valsartan (ENTRESTO) 49-51 MG,  Take 1 tablet by mouth 2 (two) times daily., Disp: 60 tablet, Rfl: 6 ?  Semaglutide,0.25 or 0.5MG/DOS, (OZEMPIC, 0.25 OR 0.5 MG/DOSE,) 2 MG/1.5ML SOPN, Inject 0.25 mg into the skin once a week. (Patient not taking: Reported on 03/28/2022), Disp: 1.5 mL, Rfl: 0 ?  spironolactone (ALDACTONE) 25 MG tablet, Take 1 tablet (25 mg total) by mouth daily., Disp: 30 tablet, Rfl: 0 ? ?Past Medical History: ?Past Medical History:  ?Diagnosis Date  ? 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 a. 05/2021 noted during periods of sleep @ time of hospitalization for PE.  ? Cardiomyopathy ([place])   ? a. 05/2021 Echo: EF <20%, glob HK. [place]:C ratio of 2.1:1 suggesting non-compaction. Gr2 DD. Sev reduced RV fxn. Nl PASP. Mildly dil RA. Mild MR; b. 10/2021 Echo: EF <20%, GrII DD; c. 10/2021 TEE: EF 20-25%, glob HK, sev red RV fxn.  ? Diabetes mellitus without complication ([place])   ? HFrEF (heart failure with reduced ejection fraction) ([place])   ? a. 05/2021 Echo: EF <20%. ? non-compaction.  ? Hypertension   ? Pulmonary embolism ([place])   ? a. 05/2021 CTA Chest: PE extending from lobar arteris of RUL and RLL w/ concern for pulml infarct-->s/p thrombolysis and thrombectomy of RUL/RML/RLL.  ? Stroke [facility])   ? a. 10/2021 CT Head: Bilat cerebral infarcts w/ mod-sided acute to subacute R temporoparietal infarct; b. 10/2021 MRI:  R post PCA territory infarcts w/o hemorrhage; c. 10/2021 TEE: Neg bubble study. No LA/LAA thrombus. EF 20-25%, glob HK.  ? ? ?Tobacco Use: ?Social History  ? ?Tobacco Use  ?Smoking Status Never  ?Smokeless Tobacco Never  ? ? ?Labs: ?Review Flowsheet   ? ?  ?  Latest Ref Rng & Units 03/17/2022 03/18/2022 03/19/2022 03/20/2022  ?Labs for ITP Cardiac and Pulmonary Rehab  ?O2 Saturation % 71.2   70.4   71.7   86.7    ? ?  03/21/2022  ?Labs for ITP Cardiac and Pulmonary Rehab  ?O2 Saturation 68.4    ?  ? ? Multiple values from one day are sorted in reverse-chronological order  ?  ?  ? ? ? ?Exercise Target Goals: ?Exercise Program Goal: ?Individual exercise  prescription set using results from initial 6 min walk test and THRR while considering  patient?s activity barriers and safety.  ? ?Exercise Prescription Goal: ?Initial exercise prescription builds to 30-45 minutes a day of aerobic activity, 2-3 days per week.  Home exercise guidelines will be given to patient during program as part of exercise prescription that the participant will acknowledge. ? ? ?Education: Aerobic Exercise: ?- Group verbal and visual presentation on the components of exercise prescription. Introduces F.I.T.T principle from ACSM for exercise prescriptions.  Reviews F.I.T.T. principles of aerobic exercise including progression. Written material given at graduation. ?Flowsheet Row Cardiac Rehab from 04/19/2022 in [facility] Cardiac and Pulmonary Rehab  ?Education need identified 04/19/22  ? ?  ? ? ?Education: Resistance Exercise: ?- Group verbal and visual presentation on the components of exercise prescription. Introduces F.I.T.T principle from ACSM for exercise prescriptions  Reviews F.I.T.T. principles of resistance exercise including progression. Written material given at graduation. ? ?  ?Education: Exercise & Equipment Safety: ?- Individual verbal instruction and demonstration of equipment use and safety with use of the equipment. ?Flowsheet Row Cardiac Rehab from 04/19/2022 in [facility] Cardiac and Pulmonary Rehab  ?Education need identified 04/19/22  ?Date 04/19/22  ?Educator KL  ?Instruction Review Code 1- Verbalizes Understanding  ? ?  ? ? ?Education: Exercise Physiology & General Exercise Guidelines: ?- Group verbal and written instruction with models to review the exercise physiology of the cardiovascular system and associated critical values. Provides general exercise guidelines with specific guidelines to those with heart or lung disease.  ?Flowsheet Row Cardiac Rehab from 04/19/2022 in [facility] Cardiac and Pulmonary Rehab  ?Education need identified 04/19/22  ? ?  ? ? ?Education: Flexibility,  Balance, Mind/Body Relaxation: ?- Group verbal and visual presentation with interactive activity on the components of exercise prescription. Introduces F.I.T.T principle from ACSM for exercise prescriptions. Reviews F.I.T.T. principles of flexibility and balance exercise training including progression. Also discusses the mind body connection.  Reviews various relaxation techniques to help reduce and manage stress (i.e. Deep breathing, progressive muscle relaxation, and visualization). Balance handout provided to take home. Written material given at graduation. ? ? ?Activity Barriers & Risk Stratification: ? Activity Barriers & Cardiac Risk Stratification - 04/19/22 1617   ? ?  ? Activity Barriers & Cardiac Risk Stratification  ? Activity Barriers Decreased Ventricular Function;Deconditioning;Muscular Weakness   ? Comments calf muscle left leg (MD aware)   ? Cardiac Risk Stratification High   ? ?  ?  ? ?  ? ? ?6 Minute Walk: ? 6 Minute Walk   ? ? [place] Name 04/19/22 1617  ?  ?  ?  ? 6 Minute Walk  ? Phase Initial    ? Distance  1020 feet    ? Walk Time 6 minutes    ? # of Rest Breaks 0    ? MPH 1.93    ? METS 3.88    ? RPE 9    ? Perceived Dyspnea  0    ? VO2 Peak 13.6    ? Symptoms Yes (comment)    ? Comments Left calf pain 3/10    ? Resting HR 92 bpm    ? Resting BP 104/70    ? Resting Oxygen Saturation  98 %    ? Exercise Oxygen Saturation  during 6 min walk 98 %    ? Max Ex. HR 119 bpm    ? Max Ex. BP 132/74    ? 2 Minute Post BP 108/78    ? ?  ?  ? ?  ? ? ?Oxygen Initial Assessment: ? ? ?Oxygen Re-Evaluation: ? ? ?Oxygen Discharge (Final Oxygen Re-Evaluation): ? ? ?Initial Exercise Prescription: ? Initial Exercise Prescription - 04/19/22 1600   ? ?  ? Date of Initial Exercise RX and Referring Provider  ? Date 04/19/22   ? Referring Provider Glori Bickers MD   ?  ? Oxygen  ? Maintain Oxygen Saturation 88% or higher   ?  ? NuStep  ? Level 1   ? SPM 80   ? Minutes 15   ? METs 3.8   ?  ? REL-XR  ? Level 1   ? Speed  50   ? Minutes 15   ? METs 3.8   ?  ? T5 Nustep  ? Level 1   ? SPM 80   ? Minutes 15   ? METs 3.8   ?  ? Track  ? Laps 21   As tolerated - take breaks as needed  ? Minutes 15   ? METs 2.14   ?  ? Prescription Deta

## 2022-04-19 NOTE — Progress Notes (Signed)
Error

## 2022-04-19 NOTE — Patient Instructions (Addendum)
Patient Instructions ? ?Patient Details  ?Name: Robert Mccann ?MRN: [id number] ?Date of Birth: Jan 05, 1996 ?Referring Provider:  Jolaine Artist, MD ? ?Below are your personal goals for exercise, nutrition, and risk factors. Our goal is to help you stay on track towards obtaining and maintaining these goals. We will be discussing your progress on these goals with you throughout the program. ? ?Initial Exercise Prescription: ? Initial Exercise Prescription - 04/19/22 1600   ? ?  ? Date of Initial Exercise RX and Referring Provider  ? Date 04/19/22   ? Referring Provider Glori Bickers MD   ?  ? Oxygen  ? Maintain Oxygen Saturation 88% or higher   ?  ? NuStep  ? Level 1   ? SPM 80   ? Minutes 15   ? METs 3.8   ?  ? REL-XR  ? Level 1   ? Speed 50   ? Minutes 15   ? METs 3.8   ?  ? T5 Nustep  ? Level 1   ? SPM 80   ? Minutes 15   ? METs 3.8   ?  ? Track  ? Laps 21   As tolerated - take breaks as needed  ? Minutes 15   ? METs 2.14   ?  ? Prescription Details  ? Frequency (times per week) 3   ? Duration Progress to 30 minutes of continuous aerobic without signs/symptoms of physical distress   ?  ? Intensity  ? THRR 40-80% of Max Heartrate 132 - 162   ? Ratings of Perceived Exertion 11-13   ? Perceived Dyspnea 0-4   ?  ? Progression  ? Progression Continue to progress workloads to maintain intensity without signs/symptoms of physical distress.   ?  ? Resistance Training  ? Training Prescription Yes   ? Weight 3 lb   ? Reps 10-15   ? ?  ?  ? ?  ? ? ?Exercise Goals: ?Frequency: Be able to perform aerobic exercise two to three times per week in program working toward 2-5 days per week of home exercise. ? ?Intensity: Work with a perceived exertion of 11 (fairly light) - 15 (hard) while following your exercise prescription.  We will make changes to your prescription with you as you progress through the program. ?  ?Duration: Be able to do 30 to 45 minutes of continuous aerobic exercise in addition to a 5 minute warm-up  and a 5 minute cool-down routine. ?  ?Nutrition Goals: ?Your personal nutrition goals will be established when you do your nutrition analysis with the dietician. ? ?The following are general nutrition guidelines to follow: ?Cholesterol < 200mg /day ?Sodium < 1500mg /day ?Fiber: Men under 50 yrs - 38 grams per day ? ?Personal Goals: ? Personal Goals and Risk Factors at Admission - 04/19/22 1630   ? ?  ? Core Components/Risk Factors/Patient Goals on Admission  ?  Weight Management Yes;Weight Loss;Obesity   ? Intervention Weight Management: Develop a combined nutrition and exercise program designed to reach desired caloric intake, while maintaining appropriate intake of nutrient and fiber, sodium and fats, and appropriate energy expenditure required for the weight goal.;Weight Management: Provide education and appropriate resources to help participant work on and attain dietary goals.;Weight Management/Obesity: Establish reasonable short term and long term weight goals.;Obesity: Provide education and appropriate resources to help participant work on and attain dietary goals.   ? Admit Weight 321 lb (145.6 kg)   ? Goal Weight: Short Term 315 lb (142.9 kg)   ?  Goal Weight: Long Term 260 lb (117.9 kg)   ? Expected Outcomes Long Term: Adherence to nutrition and physical activity/exercise program aimed toward attainment of established weight goal;Short Term: Continue to assess and modify interventions until short term weight is achieved;Weight Loss: Understanding of general recommendations for a balanced deficit meal plan, which promotes 1-2 lb weight loss per week and includes a negative energy balance of [phone] kcal/d;Understanding recommendations for meals to include 15-35% energy as protein, 25-35% energy from fat, 35-60% energy from carbohydrates, less than 200mg  of dietary cholesterol, 20-35 gm of total fiber daily;Understanding of distribution of calorie intake throughout the day with the consumption of 4-5  meals/snacks   ? Diabetes Yes   ? Intervention Provide education about signs/symptoms and action to take for hypo/hyperglycemia.;Provide education about proper nutrition, including hydration, and aerobic/resistive exercise prescription along with prescribed medications to achieve blood glucose in normal ranges: Fasting glucose 65-99 mg/dL   ? Expected Outcomes Short Term: Participant verbalizes understanding of the signs/symptoms and immediate care of hyper/hypoglycemia, proper foot care and importance of medication, aerobic/resistive exercise and nutrition plan for blood glucose control.;Long Term: Attainment of HbA1C < 7%.   ? Heart Failure Yes   ? Intervention Provide a combined exercise and nutrition program that is supplemented with education, support and counseling about heart failure. Directed toward relieving symptoms such as shortness of breath, decreased exercise tolerance, and extremity edema.   ? Expected Outcomes Improve functional capacity of life;Short term: Attendance in program 2-3 days a week with increased exercise capacity. Reported lower sodium intake. Reported increased fruit and vegetable intake. Reports medication compliance.;Short term: Daily weights obtained and reported for increase. Utilizing diuretic protocols set by physician.;Long term: Adoption of self-care skills and reduction of barriers for early signs and symptoms recognition and intervention leading to self-care maintenance.   ? Hypertension Yes   ? Intervention Provide education on lifestyle modifcations including regular physical activity/exercise, weight management, moderate sodium restriction and increased consumption of fresh fruit, vegetables, and low fat dairy, alcohol moderation, and smoking cessation.;Monitor prescription use compliance.   ? Expected Outcomes Short Term: Continued assessment and intervention until BP is < 140/38mm HG in hypertensive participants. < 130/30mm HG in hypertensive participants with diabetes,  heart failure or chronic kidney disease.;Long Term: Maintenance of blood pressure at goal levels.   ? Lipids Yes   ? Intervention Provide education and support for participant on nutrition & aerobic/resistive exercise along with prescribed medications to achieve LDL 70mg , HDL >40mg .   ? Expected Outcomes Short Term: Participant states understanding of desired cholesterol values and is compliant with medications prescribed. Participant is following exercise prescription and nutrition guidelines.;Long Term: Cholesterol controlled with medications as prescribed, with individualized exercise RX and with personalized nutrition plan. Value goals: LDL < 70mg , HDL > 40 mg.   ? ?  ?  ? ?  ? ? ?Tobacco Use Initial Evaluation: ?Social History  ? ?Tobacco Use  ?Smoking Status Never  ?Smokeless Tobacco Never  ? ? ?Exercise Goals and Review: ? Exercise Goals   ? ? [place] Name 04/19/22 1628  ?  ?  ?  ?  ?  ? Exercise Goals  ? Increase Physical Activity Yes      ? Intervention Provide advice, education, support and counseling about physical activity/exercise needs.;Develop an individualized exercise prescription for aerobic and resistive training based on initial evaluation findings, risk stratification, comorbidities and participant's personal goals.      ? Expected Outcomes Short Term: Attend rehab on a regular  basis to increase amount of physical activity.;Long Term: Add in home exercise to make exercise part of routine and to increase amount of physical activity.;Long Term: Exercising regularly at least 3-5 days a week.      ? Increase Strength and Stamina Yes      ? Intervention Provide advice, education, support and counseling about physical activity/exercise needs.;Develop an individualized exercise prescription for aerobic and resistive training based on initial evaluation findings, risk stratification, comorbidities and participant's personal goals.      ? Expected Outcomes Short Term: Increase workloads from initial  exercise prescription for resistance, speed, and METs.;Short Term: Perform resistance training exercises routinely during rehab and add in resistance training at home;Long Term: Improve cardiorespiratory fitness, mus

## 2022-04-23 ENCOUNTER — Encounter: Payer: 59 | Admitting: *Deleted

## 2022-04-23 DIAGNOSIS — I5043 Acute on chronic combined systolic (congestive) and diastolic (congestive) heart failure: Secondary | ICD-10-CM | POA: Diagnosis not present

## 2022-04-23 DIAGNOSIS — I5022 Chronic systolic (congestive) heart failure: Secondary | ICD-10-CM

## 2022-04-23 LAB — GLUCOSE, CAPILLARY
Glucose-Capillary: 158 mg/dL — ABNORMAL HIGH (ref 70–99)
Glucose-Capillary: 144 mg/dL — ABNORMAL HIGH (ref 70–99)

## 2022-04-23 NOTE — Progress Notes (Signed)
Daily Session Note ? ?Patient Details  ?Name: Robert Mccann ?MRN: [id number] ?Date of Birth: 12/03/1996 ?Referring Provider:   ?Flowsheet Row Cardiac Rehab from 04/19/2022 in [facility] Cardiac and Pulmonary Rehab  ?Referring Provider Bensimhon, Daniel MD  ? ?  ? ? ?Encounter Date: 04/23/2022 ? ?Check In: ? Session Check In - 04/23/22 1113   ? ?  ? Check-In  ? Supervising physician immediately available to respond to emergencies See telemetry face sheet for immediately available ER MD   ? Location [facility]-Cardiac & Pulmonary Rehab   ? Staff Present  , RN BSN;Kelly Hayes, BS, ACSM CEP, Exercise Physiologist;Amanda Sommer, BA, ACSM CEP, Exercise Physiologist;Kelly Bollinger, MPA, RN   ? Virtual Visit No   ? Medication changes reported     No   ? Fall or balance concerns reported    No   ? Warm-up and Cool-down Performed on first and last piece of equipment   ? Resistance Training Performed Yes   ? VAD Patient? No   ? PAD/SET Patient? No   ?  ? Pain Assessment  ? Currently in Pain? No/denies   ? ?  ?  ? ?  ? ? ? ? ? ?Social History  ? ?Tobacco Use  ?Smoking Status Never  ?Smokeless Tobacco Never  ? ? ?Goals Met:  ?Independence with exercise equipment ?Exercise tolerated well ?No report of concerns or symptoms today ?Strength training completed today ? ?Goals Unmet:  ?Not Applicable ? ?Comments: First full day of exercise!  Patient was oriented to gym and equipment including functions, settings, policies, and procedures.  Patient's individual exercise prescription and treatment plan were reviewed.  All starting workloads were established based on the results of the 6 minute walk test done at initial orientation visit.  The plan for exercise progression was also introduced and progression will be customized based on patient's performance and goals. ? ? ? ?Dr. Mark Miller is Medical Director for [facility].  ?Dr. Fuad Aleskerov is Medical Director for [facility]. ?

## 2022-04-25 ENCOUNTER — Ambulatory Visit (HOSPITAL_COMMUNITY)
Admission: RE | Admit: 2022-04-25 | Discharge: 2022-04-25 | Disposition: A | Discharge status: Home / Self Care | Payer: 59 | Source: Ambulatory Visit | Attending: Internal Medicine | Admitting: Internal Medicine

## 2022-04-25 ENCOUNTER — Encounter (HOSPITAL_COMMUNITY): Payer: Self-pay | Admitting: Internal Medicine

## 2022-04-25 ENCOUNTER — Ambulatory Visit (HOSPITAL_BASED_OUTPATIENT_CLINIC_OR_DEPARTMENT_OTHER)
Admission: RE | Admit: 2022-04-25 | Discharge: 2022-04-25 | Disposition: A | Discharge status: Home / Self Care | Payer: 59 | Source: Ambulatory Visit | Attending: Internal Medicine | Admitting: Internal Medicine

## 2022-04-25 VITALS — BP 110/70 | HR 100 | Wt 313.0 lb

## 2022-04-25 DIAGNOSIS — E119 Type 2 diabetes mellitus without complications: Secondary | ICD-10-CM | POA: Insufficient documentation

## 2022-04-25 DIAGNOSIS — I3139 Other pericardial effusion (noninflammatory): Secondary | ICD-10-CM | POA: Diagnosis not present

## 2022-04-25 DIAGNOSIS — I240 Acute coronary thrombosis not resulting in myocardial infarction: Secondary | ICD-10-CM

## 2022-04-25 DIAGNOSIS — I509 Heart failure, unspecified: Secondary | ICD-10-CM | POA: Diagnosis not present

## 2022-04-25 DIAGNOSIS — I110 Hypertensive heart disease with heart failure: Secondary | ICD-10-CM | POA: Diagnosis present

## 2022-04-25 DIAGNOSIS — I5022 Chronic systolic (congestive) heart failure: Secondary | ICD-10-CM

## 2022-04-25 DIAGNOSIS — I441 Atrioventricular block, second degree: Secondary | ICD-10-CM

## 2022-04-25 DIAGNOSIS — I5023 Acute on chronic systolic (congestive) heart failure: Secondary | ICD-10-CM | POA: Diagnosis not present

## 2022-04-25 DIAGNOSIS — E1169 Type 2 diabetes mellitus with other specified complication: Secondary | ICD-10-CM

## 2022-04-25 DIAGNOSIS — E785 Hyperlipidemia, unspecified: Secondary | ICD-10-CM

## 2022-04-25 LAB — CBC
HCT: 57.4 % — ABNORMAL HIGH (ref 39.0–52.0)
Hemoglobin: 18.6 g/dL — ABNORMAL HIGH (ref 13.0–17.0)
MCH: 28.3 pg (ref 26.0–34.0)
MCHC: 32.4 g/dL (ref 30.0–36.0)
MCV: 87.2 fL (ref 80.0–100.0)
Platelets: 332 10*3/uL (ref 150–400)
RBC: 6.58 MIL/uL — ABNORMAL HIGH (ref 4.22–5.81)
RDW: 15.9 % — ABNORMAL HIGH (ref 11.5–15.5)
WBC: 6.7 10*3/uL (ref 4.0–10.5)
nRBC: 0 % (ref 0.0–0.2)

## 2022-04-25 LAB — BASIC METABOLIC PANEL
Anion gap: 9 (ref 5–15)
BUN: 11 mg/dL (ref 6–20)
CO2: 29 mmol/L (ref 22–32)
Calcium: 9.5 mg/dL (ref 8.9–10.3)
Chloride: 98 mmol/L (ref 98–111)
Creatinine, Ser: 1.12 mg/dL (ref 0.61–1.24)
GFR, Estimated: >60 mL/min (ref >60)
Glucose, Bld: 123 mg/dL — ABNORMAL HIGH (ref 70–99)
Potassium: 4.3 mmol/L (ref 3.5–5.1)
Sodium: 136 mmol/L (ref 135–145)

## 2022-04-25 LAB — ECHOCARDIOGRAM COMPLETE
Area-P 1/2: 4.89 cm2
S' Lateral: 6.1 cm
Single Plane A4C EF: 26.8 %

## 2022-04-25 LAB — BRAIN NATRIURETIC PEPTIDE: B Natriuretic Peptide: 410.7 pg/mL — ABNORMAL HIGH (ref 0.0–100.0)

## 2022-04-25 MED ORDER — FUROSEMIDE 40 MG PO TABS: 80.0000 mg | ORAL_TABLET | Freq: Every day | ORAL | 6 refills | Status: DC

## 2022-04-25 MED ORDER — PERFLUTREN LIPID MICROSPHERE
1.0000 mL | INTRAVENOUS | Status: DC | PRN
  Administered 2022-04-25: 3 mL via INTRAVENOUS
  Filled 2022-04-25: qty 10

## 2022-04-25 MED ORDER — POTASSIUM CHLORIDE 20 MEQ PO PACK: 40.0000 meq | PACK | Freq: Every day | ORAL | 6 refills | Status: DC

## 2022-04-25 MED ORDER — SPIRONOLACTONE 25 MG PO TABS: 25.0000 mg | ORAL_TABLET | Freq: Every day | ORAL | 6 refills | Status: DC

## 2022-04-25 MED ORDER — ENTRESTO 97-103 MG PO TABS: 1.0000 | ORAL_TABLET | Freq: Two times a day (BID) | ORAL | 6 refills | Status: DC

## 2022-04-25 MED ORDER — DIGOXIN 125 MCG PO TABS: 0.1250 mg | ORAL_TABLET | Freq: Every day | ORAL | 6 refills | Status: DC

## 2022-04-25 MED ORDER — APIXABAN 5 MG PO TABS: 5.0000 mg | ORAL_TABLET | Freq: Two times a day (BID) | ORAL | 6 refills | Status: DC

## 2022-04-25 NOTE — Progress Notes (Signed)
Blood collected for Cardiomyopathy genetic testing per Dr Bensimhon.  Order form completed and both shipped by [employer] to [facility].  

## 2022-04-25 NOTE — Progress Notes (Signed)
? ?ADVANCED HF CLINIC CONSULT NOTE ? ?Primary Care: Ellene Route ?HF Cardiologist: Dr. Haroldine Laws ? ?HPI: ?Robert Mccann is a 26 y.o. 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 ? ?Admitted 6/22 to [facility] with PE. Underwent thrombectomy/thrombolysis of the right upper/middle/lower lobes. Dopplers negative for DVT though it was noted that the patient previously spent 12 hours each way to [place] and [place] and back about a week or 2 prior to admission. Echo showed EF of less than 20% with global hypokinesis and severely reduced RV function. 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 Noted to have intermittent Mobitz 2 heart block during periods of sleep with recommendation for outpatient sleep study. Started on GDMT and discharged home.  ?  ?Admitted 10/2021 with dysarthria and left hand numbness/weakness. Found to have a large acute posterior right MCA territory infarct. TEE did not show any LA/LAA/LV thrombus, and bubble study was negative.  He reported compliance with Eliquis, which was held in the setting of areas of petechial hemorrhage on follow-up CT.  We discussed potentially switching to warfarin however, patient preferred to remain on Eliquis, and this was resumed once cleared by neurology.  Unable to complete coronary CTA due to inability to maintain adequate heart rate. ?  ?Admitted to [facility] 3/23 with CP and SOB. CT chest with small LUL PE, RA clot. ECHO EF <20% severe RV dysfunction large LV clot. Transferred to [facility] for further management. No R/LHC with RA and LV clot. PICC placed, milrinone and lasix started. cMRI with LVEF 11%, RVEF 19%, large LV thrombus, criteria for LV non-compaction met, subendocardial LGE which can be seen in CAD and noncompaction, moderate pericardial effusion, moderate TR, mild MR. Milrinone weaned and GDMT titrated, no beta  blocker with wenckebach AV block. Not currently candidate for advanced therapies due to size and noncompliance. Discharged home, weight 309 lbs. ? ?Today he returns for HF follow up with wife. ECHO today 04/25/22 EF <20% moderate RV dysfunction large LV clot.Buddy Duty CR at [facility] on Monday. Walked on the track for some time. Denied SOB. Otherwise not very active. Gets tired easy. No edema, orthopnea or PND. Compliant with meds. Taking eliquis no bleeding. Weight was going up 304 -> 320. Lasix increased from 40 to 80 daily. Weight back down to 305.  ? ?Cardiac Studies: ?- cMRI (3/23): with LVEF 11%, RVEF 19%, large LV thrombus, criteria for LV non-compaction met, subendocardial LGE which can be seen in CAD and noncompaction, moderate pericardial effusion, moderate TR, mild MR.  ?- Echo (3/23): EF <20% severe RV dysfunction large LV clot. ?- TEE (11/22): EF 20-25%, global HK LV, severe RV dysfunction, no clot, bubble study negative. ?- Echo (6/22): EF < 20% with global hypokinesis and severely reduced RV function. ? ?Past Medical History:  ?Diagnosis Date  ? 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 a. 05/2021 noted during periods of sleep @ time of hospitalization for PE.  ? Cardiomyopathy ([place])   ? a. 05/2021 Echo: EF <20%, glob HK. [place]:C ratio of 2.1:1 suggesting non-compaction. Gr2 DD. Sev reduced RV fxn. Nl PASP. Mildly dil RA. Mild MR; b. 10/2021 Echo: EF <20%, GrII DD; c. 10/2021 TEE: EF 20-25%, glob HK, sev red RV fxn.  ? Diabetes mellitus without complication ([place])   ? HFrEF (heart failure with reduced ejection fraction) ([place])   ? a. 05/2021 Echo: EF <20%. ? non-compaction.  ?  Hypertension   ? Pulmonary embolism ([place])   ? a. 05/2021 CTA Chest: PE extending from lobar arteris of RUL and RLL w/ concern for pulml infarct-->s/p thrombolysis and thrombectomy of RUL/RML/RLL.  ? Stroke [facility])   ? a. 10/2021 CT Head: Bilat cerebral infarcts w/ mod-sided acute to subacute R temporoparietal infarct; b. 10/2021 MRI: R post PCA territory infarcts  w/o hemorrhage; c. 10/2021 TEE: Neg bubble study. No LA/LAA thrombus. EF 20-25%, glob HK.  ? ?Current Outpatient Medications  ?Medication Sig Dispense Refill  ? apixaban (ELIQUIS) 5 MG TABS tablet Take 1 tablet (5 mg total) by mouth 2 (two) times daily. 60 tablet 0  ? Blood Glucose Monitoring Suppl (BLOOD GLUCOSE MONITOR SYSTEM) w/Device KIT Use up to four times daily as directed. 1 kit 3  ? Blood Glucose Monitoring Suppl (CONTOUR NEXT ONE) DEVI Use up to four times daily as directed 1 each 0  ? furosemide (LASIX) 40 MG tablet Patient takes 2 tablets by mouth in the morning.    ? glucose blood (CONTOUR NEXT TEST) test strip Use as directed up to 4 times daily. 100 each 0  ? metFORMIN (GLUCOPHAGE) 500 MG tablet Take 1 tablet (500 mg total) by mouth 2 (two) times daily with a meal. 60 tablet 0  ? potassium chloride (KLOR-CON) 20 MEQ packet Take 40 mEq by mouth daily as needed. Take on days you take Lasix. 100 packet 3  ? rosuvastatin (CRESTOR) 40 MG tablet Take 1 tablet (40 mg total) by mouth daily. 90 tablet 3  ? sacubitril-valsartan (ENTRESTO) 49-51 MG Take 1 tablet by mouth 2 (two) times daily. 60 tablet 6  ? Semaglutide,0.25 or 0.5MG/DOS, (OZEMPIC, 0.25 OR 0.5 MG/DOSE,) 2 MG/1.5ML SOPN Inject 0.25 mg into the skin once a week. 1.5 mL 0  ? spironolactone (ALDACTONE) 25 MG tablet Take 1 tablet (25 mg total) by mouth daily. 30 tablet 0  ? digoxin (LANOXIN) 0.125 MG tablet Take 1 tablet (0.125 mg total) by mouth daily. (Patient not taking: Reported on 04/25/2022) 30 tablet 0  ? ?No current facility-administered medications for this encounter.  ? ?Facility-Administered Medications Ordered in Other Encounters  ?Medication Dose Route Frequency Provider Last Rate Last Admin  ? perflutren lipid microspheres (DEFINITY) IV suspension  1-10 mL Intravenous PRN Garnie Borchardt, Shaune Pascal, MD   3 mL at 04/25/22 1134  ? ?No Known Allergies ? ?Social History  ? ?Socioeconomic History  ? Marital status: Single  ?  Spouse name: Not on file   ? Number of children: Not on file  ? Years of education: Not on file  ? Highest education level: Not on file  ?Occupational History  ? Not on file  ?Tobacco Use  ? Smoking status: Never  ? Smokeless tobacco: Never  ?Vaping Use  ? Vaping Use: Never used  ?Substance and Sexual Activity  ? Alcohol use: Not Currently  ?  Comment: special occasions  ? Drug use: Not Currently  ?  Types: Marijuana  ?  Comment: last month  ? Sexual activity: Yes  ?  Partners: Female  ?Other Topics Concern  ? Not on file  ?Social History Narrative  ? Not on file  ? ?Social Determinants of Health  ? ?Financial Resource Strain: Not on file  ?Food Insecurity: Not on file  ?Transportation Needs: Not on file  ?Physical Activity: Not on file  ?Stress: Not on file  ?Social Connections: Not on file  ?Intimate Partner Violence: Not on file  ? ?Family History  ?Problem Relation  Age of Onset  ? Heart failure Mother   ? Cerebrovascular Accident Mother   ? Diabetes Father   ? Hypertension Father   ? ?BP 110/70   Pulse 100   Wt (!) 142 kg (313 lb)   SpO2 99%   BMI 43.65 kg/m?  ? ?Wt Readings from Last 3 Encounters:  ?04/25/22 (!) 142 kg (313 lb)  ?04/19/22 (!) 145.7 kg (321 lb 3.2 oz)  ?03/29/22 (!) 143.3 kg (316 lb)  ? ?PHYSICAL EXAM: ?General:  Well appearing. No resp difficulty ?HEENT: normal ?Neck: supple. no JVD. Carotids 2+ bilat; no bruits. No lymphadenopathy or thryomegaly appreciated. ?Cor:  Regular rate & rhythm. No rubs, gallops or murmurs. ?Lungs: clear ?Abdomen: obese soft, nontender, nondistended. No hepatosplenomegaly. No bruits or masses. Good bowel sounds. ?Extremities: no cyanosis, clubbing, rash, edema ?Neuro: alert & orientedx3, cranial nerves grossly intact. moves all 4 extremities w/o difficulty. Affect pleasant ? ? ?ASSESSMENT & PLAN: ? ?1. 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 ?- Stable

## 2022-04-25 NOTE — Patient Instructions (Signed)
Medication Changes: ? ?Increase Entresto to 97/103 mg Twice daily  ? ?Lab Work: ? ?Labs done today, your results will be available in MyChart, we will contact you for abnormal readings. ? ?Genetic test has been done, this has to be sent to [place] to be processed and can take 1-2 weeks to get results back.  We will let you know the results. ? ? ?Testing/Procedures: ? ?None ? ?Referrals: ? ?None ? ?Special Instructions // Education: ? ?Do the following things EVERYDAY: ?Weigh yourself in the morning before breakfast. Write it down and keep it in a log. ?Take your medicines as prescribed ?Eat low salt foods--Limit salt (sodium) to 2000 mg per day.  ?Stay as active as you can everyday ?Limit all fluids for the day to less than 2 liters ? ? ?Follow-Up in: 2 months ? ?At the [facility], you and your health needs are our priority. We have a designated team specialized in the treatment of Heart Failure. This Care Team includes your primary Heart Failure Specialized Cardiologist (physician), Advanced Practice Providers (APPs- Physician Assistants and Nurse Practitioners), and Pharmacist who all work together to provide you with the care you need, when you need it.  ? ?You may see any of the following providers on your designated Care Team at your next follow up: ? ?Dr Arvilla Meres ?Dr Marca Ancona ?Tonye Becket, NP ?Robbie Lis, PA ?Jessica [place]ford,NP ?Anna Genre, PA ?Karle Plumber, PharmD ? ? ?Please be sure to bring in all your medications bottles to every appointment.  ? ?Need to Contact us: ? ?If you have any questions or concerns before your next appointment please send [place] a message through [place] or call our office at [phone].   ? ?TO LEAVE A MESSAGE FOR THE NURSE SELECT OPTION 2, PLEASE LEAVE A MESSAGE INCLUDING: ?YOUR NAME ?DATE OF BIRTH ?CALL BACK NUMBER ?REASON FOR CALL**this is important as we prioritize the call backs ? ?YOU WILL RECEIVE A CALL BACK THE SAME DAY AS LONG AS  YOU CALL BEFORE 4:00 PM ? ? ? ?

## 2022-04-27 ENCOUNTER — Telehealth: Payer: Self-pay | Admitting: Pharmacist

## 2022-04-27 ENCOUNTER — Encounter: Payer: 59 | Admitting: *Deleted

## 2022-04-27 DIAGNOSIS — I5022 Chronic systolic (congestive) heart failure: Secondary | ICD-10-CM

## 2022-04-27 DIAGNOSIS — I5043 Acute on chronic combined systolic (congestive) and diastolic (congestive) heart failure: Secondary | ICD-10-CM | POA: Diagnosis not present

## 2022-04-27 LAB — GLUCOSE, CAPILLARY
Glucose-Capillary: 164 mg/dL — ABNORMAL HIGH (ref 70–99)
Glucose-Capillary: 148 mg/dL — ABNORMAL HIGH (ref 70–99)

## 2022-04-27 MED ORDER — OZEMPIC (0.25 OR 0.5 MG/DOSE) 2 MG/1.5ML ~~LOC~~ SOPN: PEN_INJECTOR | SUBCUTANEOUS | 0 refills | Status: DC

## 2022-04-27 NOTE — Progress Notes (Signed)
Daily Session Note ? ?Patient Details  ?Name: Robert Mccann ?MRN: [id number] ?Date of Birth: 07-Aug-1996 ?Referring Provider:   ?Flowsheet Row Cardiac Rehab from 04/19/2022 in [facility] Cardiac and Pulmonary Rehab  ?Referring Provider Glori Bickers MD  ? ?  ? ? ?Encounter Date: 04/27/2022 ? ?Check In: ? Session Check In - 04/27/22 1113   ? ?  ? Check-In  ? Supervising physician immediately available to respond to emergencies See telemetry face sheet for immediately available ER MD   ? Location [facility]-Cardiac & Pulmonary Rehab   ? Staff Present Heath Lark, RN, BSN, CCRP;Jessica [place], MA, RCEP, CCRP, CCET;Joseph [place], [place]   ? Virtual Visit No   ? Medication changes reported     Yes   ? Comments Entresto dose increase on Monday   ? Fall or balance concerns reported    No   ? Warm-up and Cool-down Performed on first and last piece of equipment   ? Resistance Training Performed Yes   ? VAD Patient? No   ? PAD/SET Patient? No   ?  ? Pain Assessment  ? Currently in Pain? No/denies   ? ?  ?  ? ?  ? ? ? ? ? ?Social History  ? ?Tobacco Use  ?Smoking Status Never  ?Smokeless Tobacco Never  ? ? ?Goals Met:  ?Exercise tolerated well ?No report of concerns or symptoms today ? ?Goals Unmet:  ?Not Applicable ? ?Comments: Pt able to follow exercise prescription today without complaint.  Will continue to monitor for progression. ? ? ? ?Dr. Emily Filbert is Medical Director for [place].  ?Dr. Ottie Glazier is Medical Director for [facility] Pulmonary Rehabilitation. ?

## 2022-04-27 NOTE — Telephone Encounter (Signed)
Called pt to follow up with Ozempic. Pt reports tolerating med well. Will increase to 0.5mg  weekly dosing for month 2. Will call pt in another month for subsequent dose titration. ?

## 2022-04-30 ENCOUNTER — Other Ambulatory Visit: Payer: 59 | Admitting: *Deleted

## 2022-04-30 ENCOUNTER — Other Ambulatory Visit: Payer: Self-pay | Admitting: Pharmacist

## 2022-04-30 DIAGNOSIS — E782 Mixed hyperlipidemia: Secondary | ICD-10-CM

## 2022-04-30 MED ORDER — OZEMPIC (0.25 OR 0.5 MG/DOSE) 2 MG/3ML ~~LOC~~ SOPN: 0.5000 mg | PEN_INJECTOR | SUBCUTANEOUS | 0 refills | Status: DC

## 2022-05-01 ENCOUNTER — Telehealth: Payer: Self-pay | Admitting: Pharmacist

## 2022-05-01 DIAGNOSIS — I5023 Acute on chronic systolic (congestive) heart failure: Secondary | ICD-10-CM

## 2022-05-01 LAB — HEPATIC FUNCTION PANEL
ALT: 15 IU/L (ref 0–44)
AST: 23 IU/L (ref 0–40)
Albumin: 3.9 g/dL — ABNORMAL LOW (ref 4.1–5.2)
Alkaline Phosphatase: 113 IU/L (ref 44–121)
Bilirubin Total: 0.7 mg/dL (ref 0.0–1.2)
Bilirubin, Direct: 0.2 mg/dL (ref 0.00–0.40)
Total Protein: 7.2 g/dL (ref 6.0–8.5)

## 2022-05-01 LAB — LIPID PANEL
Chol/HDL Ratio: 3.9 ratio (ref 0.0–5.0)
Cholesterol, Total: 154 mg/dL (ref 100–199)
HDL: 40 mg/dL (ref >39)
LDL Chol Calc (NIH): 80 mg/dL (ref 0–99)
Triglycerides: 199 mg/dL — ABNORMAL HIGH (ref 0–149)
VLDL Cholesterol Cal: 34 mg/dL (ref 5–40)

## 2022-05-01 MED ORDER — NEXLIZET 180-10 MG PO TABS: 1.0000 | ORAL_TABLET | Freq: Every day | ORAL | 3 refills | Status: DC

## 2022-05-01 NOTE — Telephone Encounter (Addendum)
Left message for pt to discuss lipid panel results. ? ?LFTs stable, LDL improved on rosuvastatin 40mg  daily but remains elevated above goal < 55 given extensive and premature ASCVD. Will add Nexlizet 180-10mg  daily (prior auth approved through 05/02/23). Rx sent to pharmacy along with copay card, confirmed copay is $0. Will need f/u labs rechecked in 6 weeks. ? ?Copay card info: ?BIN: 07/02/23 ?PCN: [id number] ?GRP: [id number] ?ID: [id number] ?

## 2022-05-01 NOTE — Telephone Encounter (Signed)
Spoke with pt, he is aware to start Nexlizet and continue on rosuvastatin. Scheduled f/u labs mid June to assess efficacy.  ?

## 2022-05-02 ENCOUNTER — Encounter: Payer: 59 | Attending: Internal Medicine

## 2022-05-02 DIAGNOSIS — I5022 Chronic systolic (congestive) heart failure: Secondary | ICD-10-CM

## 2022-05-02 DIAGNOSIS — I5023 Acute on chronic systolic (congestive) heart failure: Secondary | ICD-10-CM | POA: Insufficient documentation

## 2022-05-02 LAB — GLUCOSE, CAPILLARY
Glucose-Capillary: 223 mg/dL — ABNORMAL HIGH (ref 70–99)
Glucose-Capillary: 143 mg/dL — ABNORMAL HIGH (ref 70–99)

## 2022-05-02 NOTE — Progress Notes (Signed)
Daily Session Note ? ?Patient Details  ?Name: Ashir Kunz ?MRN: [id number] ?Date of Birth: 20-Apr-1996 ?Referring Provider:   ?Flowsheet Row Cardiac Rehab from 04/19/2022 in [facility] Cardiac and Pulmonary Rehab  ?Referring Provider Glori Bickers MD  ? ?  ? ? ?Encounter Date: 05/02/2022 ? ?Check In: ? Session Check In - 05/02/22 1101   ? ?  ? Check-In  ? Supervising physician immediately available to respond to emergencies See telemetry face sheet for immediately available ER MD   ? Location [facility]-Cardiac & Pulmonary Rehab   ? Staff Present Birdie Sons, MPA, RN;Joseph [place], RCP,RRT,BSRT;Melissa [place], RDN, LDN   ? Virtual Visit No   ? Medication changes reported     Yes   ? Comments added nexitzet 180-10   ? Fall or balance concerns reported    No   ? Warm-up and Cool-down Performed on first and last piece of equipment   ? Resistance Training Performed Yes   ? VAD Patient? No   ? PAD/SET Patient? No   ?  ? Pain Assessment  ? Currently in Pain? No/denies   ? ?  ?  ? ?  ? ? ? ? ? ?Social History  ? ?Tobacco Use  ?Smoking Status Never  ?Smokeless Tobacco Never  ? ? ?Goals Met:  ?Independence with exercise equipment ?Exercise tolerated well ?No report of concerns or symptoms today ?Strength training completed today ? ?Goals Unmet:  ?Not Applicable ? ?Comments: Pt able to follow exercise prescription today without complaint.  Will continue to monitor for progression. ? ? ? ?Dr. Emily Filbert is Medical Director for [place].  ?Dr. Ottie Glazier is Medical Director for [facility] Pulmonary Rehabilitation. ?

## 2022-05-04 ENCOUNTER — Encounter: Payer: 59 | Admitting: *Deleted

## 2022-05-04 DIAGNOSIS — I5022 Chronic systolic (congestive) heart failure: Secondary | ICD-10-CM

## 2022-05-04 DIAGNOSIS — I5023 Acute on chronic systolic (congestive) heart failure: Secondary | ICD-10-CM | POA: Diagnosis not present

## 2022-05-04 NOTE — Progress Notes (Signed)
Daily Session Note ? ?Patient Details  ?Name: Robert Mccann ?MRN: [id number] ?Date of Birth: 04/13/1996 ?Referring Provider:   ?Flowsheet Row Cardiac Rehab from 04/19/2022 in [facility] Cardiac and Pulmonary Rehab  ?Referring Provider Glori Bickers MD  ? ?  ? ? ?Encounter Date: 05/04/2022 ? ?Check In: ? Session Check In - 05/04/22 1133   ? ?  ? Check-In  ? Supervising physician immediately available to respond to emergencies See telemetry face sheet for immediately available ER MD   ? Location [facility]-Cardiac & Pulmonary Rehab   ? Staff Present Heath Lark, RN, BSN, CCRP;Joseph [place], [place], [place], [place], [place], CCET   ? Virtual Visit No   ? Medication changes reported     No   ? Fall or balance concerns reported    No   ? Warm-up and Cool-down Performed on first and last piece of equipment   ? Resistance Training Performed Yes   ? VAD Patient? No   ? PAD/SET Patient? No   ?  ? Pain Assessment  ? Currently in Pain? No/denies   ? ?  ?  ? ?  ? ? ? ? ? ?Social History  ? ?Tobacco Use  ?Smoking Status Never  ?Smokeless Tobacco Never  ? ? ?Goals Met:  ?Independence with exercise equipment ?Exercise tolerated well ?No report of concerns or symptoms today ? ?Goals Unmet:  ?Not Applicable ? ?Comments: Reviewed RPE and dyspnea scales, THR and program prescription with pt today.  Pt voiced understanding and was given a copy of goals to take home.  ? ? ?Short: Use RPE daily to regulate intensity. Long: Follow program prescription in THR. ? ? ? ?Dr. Emily Filbert is Medical Director for [place].  ?Dr. Ottie Glazier is Medical Director for [facility] Pulmonary Rehabilitation. ?

## 2022-05-07 DIAGNOSIS — I5023 Acute on chronic systolic (congestive) heart failure: Secondary | ICD-10-CM | POA: Diagnosis not present

## 2022-05-07 DIAGNOSIS — I5022 Chronic systolic (congestive) heart failure: Secondary | ICD-10-CM

## 2022-05-07 NOTE — Progress Notes (Signed)
Daily Session Note ? ?Patient Details  ?Name: Robert Mccann ?MRN: [id number] ?Date of Birth: 09/22/1996 ?Referring Provider:   ?Flowsheet Row Cardiac Rehab from 04/19/2022 in [facility] Cardiac and Pulmonary Rehab  ?Referring Provider Glori Bickers MD  ? ?  ? ? ?Encounter Date: 05/07/2022 ? ?Check In: ? Session Check In - 05/07/22 1109   ? ?  ? Check-In  ? Supervising physician immediately available to respond to emergencies See telemetry face sheet for immediately available ER MD   ? Location [facility]-Cardiac & Pulmonary Rehab   ? Staff Present Birdie Sons, MPA, RN;Melissa [place], RDN, LDN;Jessica [place], MA, RCEP, CCRP, [place], BS, ACSM CEP, Exercise Physiologist;Kara [place], MS, ASCM CEP, Exercise Physiologist   ? Virtual Visit No   ? Medication changes reported     No   ? Fall or balance concerns reported    No   ? Warm-up and Cool-down Performed on first and last piece of equipment   ? Resistance Training Performed Yes   ? VAD Patient? No   ? PAD/SET Patient? No   ?  ? Pain Assessment  ? Currently in Pain? No/denies   ? ?  ?  ? ?  ? ? ? ? ? ?Social History  ? ?Tobacco Use  ?Smoking Status Never  ?Smokeless Tobacco Never  ? ? ?Goals Met:  ?Independence with exercise equipment ?Exercise tolerated well ?No report of concerns or symptoms today ?Strength training completed today ? ?Goals Unmet:  ?Not Applicable ? ?Comments: Pt able to follow exercise prescription today without complaint.  Will continue to monitor for progression. ? ? ? ?Dr. Emily Filbert is Medical Director for [place].  ?Dr. Ottie Glazier is Medical Director for [facility] Pulmonary Rehabilitation. ?

## 2022-05-09 DIAGNOSIS — I5022 Chronic systolic (congestive) heart failure: Secondary | ICD-10-CM

## 2022-05-09 DIAGNOSIS — I5023 Acute on chronic systolic (congestive) heart failure: Secondary | ICD-10-CM | POA: Diagnosis not present

## 2022-05-09 NOTE — Telephone Encounter (Signed)
LMOV  

## 2022-05-09 NOTE — Progress Notes (Signed)
Daily Session Note ? ?Patient Details  ?Name: Harel Repetto ?MRN: [id number] ?Date of Birth: [birth year] ?Referring Provider:   ?Flowsheet Row Cardiac Rehab from 04/19/2022 in [facility] Cardiac and Pulmonary Rehab  ?Referring Provider Glori Bickers MD  ? ?  ? ? ?Encounter Date: 05/09/2022 ? ?Check In: ? Session Check In - 05/09/22 1025   ? ?  ? Check-In  ? Supervising physician immediately available to respond to emergencies See telemetry face sheet for immediately available ER MD   ? Location [facility]-Cardiac & Pulmonary Rehab   ? Staff Present Birdie Sons, MPA, RN;Melissa [place], RDN, LDN;Joseph [place], RCP,RRT,BSRT   ? Virtual Visit No   ? Medication changes reported     No   ? Fall or balance concerns reported    No   ? Warm-up and Cool-down Performed on first and last piece of equipment   ? Resistance Training Performed Yes   ? VAD Patient? No   ? PAD/SET Patient? No   ?  ? Pain Assessment  ? Currently in Pain? No/denies   ? ?  ?  ? ?  ? ? ? ? ? ?Social History  ? ?Tobacco Use  ?Smoking Status Never  ?Smokeless Tobacco Never  ? ? ?Goals Met:  ?Independence with exercise equipment ?Exercise tolerated well ?No report of concerns or symptoms today ?Strength training completed today ? ?Goals Unmet:  ?Not Applicable ? ?Comments: Pt able to follow exercise prescription today without complaint.  Will continue to monitor for progression. ? ? ? ?Dr. Emily Filbert is Medical Director for [place].  ?Dr. Ottie Glazier is Medical Director for [facility] Pulmonary Rehabilitation. ?

## 2022-05-14 ENCOUNTER — Encounter: Payer: 59 | Admitting: *Deleted

## 2022-05-14 DIAGNOSIS — I5023 Acute on chronic systolic (congestive) heart failure: Secondary | ICD-10-CM | POA: Diagnosis not present

## 2022-05-14 DIAGNOSIS — I5022 Chronic systolic (congestive) heart failure: Secondary | ICD-10-CM

## 2022-05-14 NOTE — Progress Notes (Signed)
Daily Session Note ? ?Patient Details  ?Name: Robert Mccann ?MRN: [id number] ?Date of Birth: 1996/03/17 ?Referring Provider:   ?Flowsheet Row Cardiac Rehab from 04/19/2022 in [facility] Cardiac and Pulmonary Rehab  ?Referring Provider Glori Bickers MD  ? ?  ? ? ?Encounter Date: 05/14/2022 ? ?Check In: ? Session Check In - 05/14/22 1138   ? ?  ? Check-In  ? Supervising physician immediately available to respond to emergencies See telemetry face sheet for immediately available ER MD   ? Location [facility]-Cardiac & Pulmonary Rehab   ? Staff Present Heath Lark, RN, BSN, [employer], MPA, Nino Glow, MS, ASCM CEP, Exercise Physiologist;Kelly Amedeo Plenty, BS, ACSM CEP, Exercise Physiologist;Jessica [place], MA, RCEP, CCRP, CCET   ? Virtual Visit No   ? Medication changes reported     No   ? Fall or balance concerns reported    No   ? Warm-up and Cool-down Performed on first and last piece of equipment   ? Resistance Training Performed Yes   ? VAD Patient? No   ? PAD/SET Patient? No   ?  ? Pain Assessment  ? Currently in Pain? No/denies   ? ?  ?  ? ?  ? ? ? ? Exercise Prescription Changes - 05/14/22 1100   ? ?  ? Home Exercise Plan  ? Plans to continue exercise at Home (comment)   walking and bike  ? Frequency Add 2 additional days to program exercise sessions.   ? Initial Home Exercises Provided 05/14/22   ? ?  ?  ? ?  ? ? ?Social History  ? ?Tobacco Use  ?Smoking Status Never  ?Smokeless Tobacco Never  ? ? ?Goals Met:  ?Independence with exercise equipment ?Exercise tolerated well ?Personal goals reviewed ?No report of concerns or symptoms today ?Strength training completed today ? ?Goals Unmet:  ?Not Applicable ? ?Comments: Pt able to follow exercise prescription today without complaint.  Will continue to monitor for progression. ? ? ? ?Dr. Emily Filbert is Medical Director for [place].  ?Dr. Ottie Glazier is Medical Director for [facility] Pulmonary Rehabilitation. ?

## 2022-05-16 ENCOUNTER — Encounter: Payer: Self-pay | Admitting: *Deleted

## 2022-05-16 DIAGNOSIS — I5022 Chronic systolic (congestive) heart failure: Secondary | ICD-10-CM

## 2022-05-16 DIAGNOSIS — I5023 Acute on chronic systolic (congestive) heart failure: Secondary | ICD-10-CM | POA: Diagnosis not present

## 2022-05-16 NOTE — Progress Notes (Signed)
Cardiac Individual Treatment Plan ? ?Patient Details  ?Name: Robert Mccann ?MRN: [id number] ?Date of Birth: 1996/10/17 ?Referring Provider:   ?Flowsheet Row Cardiac Rehab from 04/19/2022 in [facility] Cardiac and Pulmonary Rehab  ?Referring Provider Glori Bickers MD  ? ?  ? ? ?Initial Encounter Date:  ?Flowsheet Row Cardiac Rehab from 04/19/2022 in [facility] Cardiac and Pulmonary Rehab  ?Date 04/19/22  ? ?  ? ? ?Visit Diagnosis: Heart failure, chronic systolic (HCC) ? ?Patient's Home Medications on Admission: ? ?Current Outpatient Medications:  ?  apixaban (ELIQUIS) 5 MG TABS tablet, Take 1 tablet (5 mg total) by mouth 2 (two) times daily., Disp: 60 tablet, Rfl: 6 ?  Bempedoic Acid-Ezetimibe (NEXLIZET) 180-10 MG TABS, Take 1 tablet by mouth daily., Disp: 90 tablet, Rfl: 3 ?  Blood Glucose Monitoring Suppl (BLOOD GLUCOSE MONITOR SYSTEM) w/Device KIT, Use up to four times daily as directed., Disp: 1 kit, Rfl: 3 ?  Blood Glucose Monitoring Suppl (CONTOUR NEXT ONE) DEVI, Use up to four times daily as directed, Disp: 1 each, Rfl: 0 ?  digoxin (LANOXIN) 0.125 MG tablet, Take 1 tablet (0.125 mg total) by mouth daily., Disp: 30 tablet, Rfl: 6 ?  furosemide (LASIX) 40 MG tablet, Take 2 tablets (80 mg total) by mouth daily., Disp: 60 tablet, Rfl: 6 ?  glucose blood (CONTOUR NEXT TEST) test strip, Use as directed up to 4 times daily., Disp: 100 each, Rfl: 0 ?  metFORMIN (GLUCOPHAGE) 500 MG tablet, Take 1 tablet (500 mg total) by mouth 2 (two) times daily with a meal., Disp: 60 tablet, Rfl: 0 ?  potassium chloride (KLOR-CON) 20 MEQ packet, Take 40 mEq by mouth daily. Take on days you take Lasix., Disp: 100 packet, Rfl: 6 ?  rosuvastatin (CRESTOR) 40 MG tablet, Take 1 tablet (40 mg total) by mouth daily., Disp: 90 tablet, Rfl: 3 ?  sacubitril-valsartan (ENTRESTO) 97-103 MG, Take 1 tablet by mouth 2 (two) times daily., Disp: 60 tablet, Rfl: 6 ?  Semaglutide,0.25 or 0.5MG/DOS, (OZEMPIC, 0.25 OR 0.5 MG/DOSE,) 2 MG/3ML SOPN, Inject 0.5 mg  into the skin once a week., Disp: 3 mL, Rfl: 0 ?  spironolactone (ALDACTONE) 25 MG tablet, Take 1 tablet (25 mg total) by mouth daily., Disp: 30 tablet, Rfl: 6 ? ?Past Medical History: ?Past Medical History:  ?Diagnosis Date  ? 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 a. 05/2021 noted during periods of sleep @ time of hospitalization for PE.  ? Cardiomyopathy ([place])   ? a. 05/2021 Echo: EF <20%, glob HK. :C ratio of 2.1:1 suggesting non-compaction. Gr2 DD. Sev reduced RV fxn. Nl PASP. Mildly dil RA. Mild MR; b. 10/2021 Echo: EF <20%, GrII DD; c. 10/2021 TEE: EF 20-25%, glob HK, sev red RV fxn.  ? Diabetes mellitus without complication ([place])   ? HFrEF (heart failure with reduced ejection fraction) ([place])   ? a. 05/2021 Echo: EF <20%. ? non-compaction.  ? Hypertension   ? Pulmonary embolism ([place])   ? a. 05/2021 CTA Chest: PE extending from lobar arteris of RUL and RLL w/ concern for pulml infarct-->s/p thrombolysis and thrombectomy of RUL/RML/RLL.  ? Stroke [facility])   ? a. 10/2021 CT Head: Bilat cerebral infarcts w/ mod-sided acute to subacute R temporoparietal infarct; b. 10/2021 MRI: R post PCA territory infarcts w/o hemorrhage; c. 10/2021 TEE: Neg bubble study. No LA/LAA thrombus. EF 20-25%, glob HK.  ? ? ?Tobacco Use: ?Social History  ? ?Tobacco Use  ?Smoking Status Never  ?Smokeless Tobacco Never  ? ? ?Labs: ?  Review Flowsheet   ? ?  ?  Latest Ref Rng & Units 03/18/2022 03/19/2022 03/20/2022 03/21/2022  ?Labs for ITP Cardiac and Pulmonary Rehab  ?Cholestrol 100 - 199 mg/dL      ?LDL (calc) 0 - 99 mg/dL      ?HDL-C >39 mg/dL      ?Trlycerides 0 - 149 mg/dL      ?O2 Saturation % 70.4   71.7   86.7   68.4    ? ?  04/30/2022  ?Labs for ITP Cardiac and Pulmonary Rehab  ?Cholestrol 154    ?LDL (calc) 80    ?HDL-C 40    ?Trlycerides 199    ?O2 Saturation   ?  ?  ?  ? ? ? ?Exercise Target Goals: ?Exercise Program Goal: ?Individual exercise prescription set using results from initial 6 min walk test and THRR while considering  patient?s activity  barriers and safety.  ? ?Exercise Prescription Goal: ?Initial exercise prescription builds to 30-45 minutes a day of aerobic activity, 2-3 days per week.  Home exercise guidelines will be given to patient during program as part of exercise prescription that the participant will acknowledge. ? ? ?Education: Aerobic Exercise: ?- Group verbal and visual presentation on the components of exercise prescription. Introduces F.I.T.T principle from ACSM for exercise prescriptions.  Reviews F.I.T.T. principles of aerobic exercise including progression. Written material given at graduation. ?Flowsheet Row Cardiac Rehab from 05/09/2022 in [facility] Cardiac and Pulmonary Rehab  ?Education need identified 04/19/22  ?Date 05/02/22  ?Educator Jh  ?Instruction Review Code 1- Verbalizes Understanding  ? ?  ? ? ?Education: Resistance Exercise: ?- Group verbal and visual presentation on the components of exercise prescription. Introduces F.I.T.T principle from ACSM for exercise prescriptions  Reviews F.I.T.T. principles of resistance exercise including progression. Written material given at graduation. ?Flowsheet Row Cardiac Rehab from 05/09/2022 in [facility] Cardiac and Pulmonary Rehab  ?Date 05/09/22  ?Educator [facility]  ?Instruction Review Code 1- Verbalizes Understanding  ? ?  ? ?  ?Education: Exercise & Equipment Safety: ?- Individual verbal instruction and demonstration of equipment use and safety with use of the equipment. ?Flowsheet Row Cardiac Rehab from 05/09/2022 in [facility] Cardiac and Pulmonary Rehab  ?Education need identified 04/19/22  ?Date 04/19/22  ?Educator [place]  ?Instruction Review Code 1- Verbalizes Understanding  ? ?  ? ? ?Education: Exercise Physiology & General Exercise Guidelines: ?- Group verbal and written instruction with models to review the exercise physiology of the cardiovascular system and associated critical values. Provides general exercise guidelines with specific guidelines to those with heart or lung disease.  ?Flowsheet  Row Cardiac Rehab from 05/09/2022 in [facility] Cardiac and Pulmonary Rehab  ?Education need identified 04/19/22  ? ?  ? ? ?Education: Flexibility, Balance, Mind/Body Relaxation: ?- Group verbal and visual presentation with interactive activity on the components of exercise prescription. Introduces F.I.T.T principle from ACSM for exercise prescriptions. Reviews F.I.T.T. principles of flexibility and balance exercise training including progression. Also discusses the mind body connection.  Reviews various relaxation techniques to help reduce and manage stress (i.e. Deep breathing, progressive muscle relaxation, and visualization). Balance handout provided to take home. Written material given at graduation. ? ? ?Activity Barriers & Risk Stratification: ? Activity Barriers & Cardiac Risk Stratification - 04/19/22 1617   ? ?  ? Activity Barriers & Cardiac Risk Stratification  ? Activity Barriers Decreased Ventricular Function;Deconditioning;Muscular Weakness   ? Comments calf muscle left leg (MD aware)   ? Cardiac Risk Stratification High   ? ?  ?  ? ?  ? ? ?  6 Minute Walk: ? 6 Minute Walk   ? ? [place] Name 04/19/22 1617  ?  ?  ?  ? 6 Minute Walk  ? Phase Initial    ? Distance 1020 feet    ? Walk Time 6 minutes    ? # of Rest Breaks 0    ? MPH 1.93    ? METS 3.88    ? RPE 9    ? Perceived Dyspnea  0    ? VO2 Peak 13.6    ? Symptoms Yes (comment)    ? Comments Left calf pain 3/10    ? Resting HR 92 bpm    ? Resting BP 104/70    ? Resting Oxygen Saturation  98 %    ? Exercise Oxygen Saturation  during 6 min walk 98 %    ? Max Ex. HR 119 bpm    ? Max Ex. BP 132/74    ? 2 Minute Post BP 108/78    ? ?  ?  ? ?  ? ? ?Oxygen Initial Assessment: ? ? ?Oxygen Re-Evaluation: ? ? ?Oxygen Discharge (Final Oxygen Re-Evaluation): ? ? ?Initial Exercise Prescription: ? Initial Exercise Prescription - 04/19/22 1600   ? ?  ? Date of Initial Exercise RX and Referring Provider  ? Date 04/19/22   ? Referring Provider Glori Bickers MD   ?  ? Oxygen   ? Maintain Oxygen Saturation 88% or higher   ?  ? NuStep  ? Level 1   ? SPM 80   ? Minutes 15   ? METs 3.8   ?  ? REL-XR  ? Level 1   ? Speed 50   ? Minutes 15   ? METs 3.8   ?  ? T5 Nustep  ? Level 1   ? SPM

## 2022-05-16 NOTE — Progress Notes (Signed)
Daily Session Note ? ?Patient Details  ?Name: Robert Mccann ?MRN: [id number] ?Date of Birth: 1996-02-18 ?Referring Provider:   ?Flowsheet Row Cardiac Rehab from 04/19/2022 in [facility] Cardiac and Pulmonary Rehab  ?Referring Provider Glori Bickers MD  ? ?  ? ? ?Encounter Date: 05/16/2022 ? ?Check In: ? Session Check In - 05/16/22 1029   ? ?  ? Check-In  ? Supervising physician immediately available to respond to emergencies See telemetry face sheet for immediately available ER MD   ? Location [facility]-Cardiac & Pulmonary Rehab   ? Staff Present Birdie Sons, MPA, RN;Melissa [place], RDN, LDN;Joseph Joyce, RCP,RRT,BSRT   ? Virtual Visit No   ? Medication changes reported     No   ? Fall or balance concerns reported    No   ? Warm-up and Cool-down Performed on first and last piece of equipment   ? Resistance Training Performed Yes   ? VAD Patient? No   ? PAD/SET Patient? No   ?  ? Pain Assessment  ? Currently in Pain? No/denies   ? ?  ?  ? ?  ? ? ? ? ? ?Social History  ? ?Tobacco Use  ?Smoking Status Never  ?Smokeless Tobacco Never  ? ? ?Goals Met:  ?Independence with exercise equipment ?Exercise tolerated well ?No report of concerns or symptoms today ?Strength training completed today ? ?Goals Unmet:  ?Not Applicable ? ?Comments: Pt able to follow exercise prescription today without complaint.  Will continue to monitor for progression. ? ? ? ?Dr. Emily Filbert is Medical Director for [facility].  ?Dr. Ottie Glazier is Medical Director for [facility] Pulmonary Rehabilitation. ?

## 2022-05-17 ENCOUNTER — Observation Stay (HOSPITAL_COMMUNITY)
Admission: EM | Admit: 2022-05-17 | Discharge: 2022-05-18 | Disposition: A | Discharge status: Home / Self Care | Payer: 59 | Attending: Internal Medicine | Admitting: Internal Medicine

## 2022-05-17 ENCOUNTER — Emergency Department (HOSPITAL_COMMUNITY): Payer: 59

## 2022-05-17 ENCOUNTER — Other Ambulatory Visit: Payer: Self-pay

## 2022-05-17 ENCOUNTER — Encounter (HOSPITAL_COMMUNITY): Payer: Self-pay | Admitting: Emergency Medicine

## 2022-05-17 DIAGNOSIS — Z6841 Body Mass Index (BMI) 40.0 and over, adult: Secondary | ICD-10-CM | POA: Insufficient documentation

## 2022-05-17 DIAGNOSIS — D751 Secondary polycythemia: Secondary | ICD-10-CM | POA: Diagnosis not present

## 2022-05-17 DIAGNOSIS — Z7984 Long term (current) use of oral hypoglycemic drugs: Secondary | ICD-10-CM | POA: Diagnosis not present

## 2022-05-17 DIAGNOSIS — E669 Obesity, unspecified: Secondary | ICD-10-CM | POA: Diagnosis not present

## 2022-05-17 DIAGNOSIS — Z8673 Personal history of transient ischemic attack (TIA), and cerebral infarction without residual deficits: Secondary | ICD-10-CM | POA: Diagnosis not present

## 2022-05-17 DIAGNOSIS — K602 Anal fissure, unspecified: Secondary | ICD-10-CM | POA: Diagnosis not present

## 2022-05-17 DIAGNOSIS — Z7901 Long term (current) use of anticoagulants: Secondary | ICD-10-CM | POA: Insufficient documentation

## 2022-05-17 DIAGNOSIS — K5730 Diverticulosis of large intestine without perforation or abscess without bleeding: Secondary | ICD-10-CM | POA: Insufficient documentation

## 2022-05-17 DIAGNOSIS — K922 Gastrointestinal hemorrhage, unspecified: Secondary | ICD-10-CM | POA: Diagnosis not present

## 2022-05-17 DIAGNOSIS — K648 Other hemorrhoids: Secondary | ICD-10-CM | POA: Insufficient documentation

## 2022-05-17 DIAGNOSIS — I513 Intracardiac thrombosis, not elsewhere classified: Secondary | ICD-10-CM | POA: Diagnosis present

## 2022-05-17 DIAGNOSIS — Z86711 Personal history of pulmonary embolism: Secondary | ICD-10-CM | POA: Diagnosis present

## 2022-05-17 DIAGNOSIS — E119 Type 2 diabetes mellitus without complications: Secondary | ICD-10-CM | POA: Diagnosis not present

## 2022-05-17 DIAGNOSIS — I2699 Other pulmonary embolism without acute cor pulmonale: Secondary | ICD-10-CM | POA: Diagnosis present

## 2022-05-17 DIAGNOSIS — K625 Hemorrhage of anus and rectum: Principal | ICD-10-CM | POA: Insufficient documentation

## 2022-05-17 DIAGNOSIS — Z79899 Other long term (current) drug therapy: Secondary | ICD-10-CM | POA: Diagnosis not present

## 2022-05-17 DIAGNOSIS — I428 Other cardiomyopathies: Secondary | ICD-10-CM | POA: Insufficient documentation

## 2022-05-17 DIAGNOSIS — I110 Hypertensive heart disease with heart failure: Secondary | ICD-10-CM | POA: Diagnosis not present

## 2022-05-17 DIAGNOSIS — I5042 Chronic combined systolic (congestive) and diastolic (congestive) heart failure: Secondary | ICD-10-CM | POA: Insufficient documentation

## 2022-05-17 DIAGNOSIS — I429 Cardiomyopathy, unspecified: Secondary | ICD-10-CM

## 2022-05-17 DIAGNOSIS — I5022 Chronic systolic (congestive) heart failure: Secondary | ICD-10-CM | POA: Insufficient documentation

## 2022-05-17 DIAGNOSIS — K635 Polyp of colon: Secondary | ICD-10-CM | POA: Diagnosis not present

## 2022-05-17 DIAGNOSIS — I509 Heart failure, unspecified: Secondary | ICD-10-CM

## 2022-05-17 DIAGNOSIS — I10 Essential (primary) hypertension: Secondary | ICD-10-CM | POA: Diagnosis present

## 2022-05-17 LAB — CBC
HCT: 53.5 % — ABNORMAL HIGH (ref 39.0–52.0)
Hemoglobin: 17.8 g/dL — ABNORMAL HIGH (ref 13.0–17.0)
MCH: 28.3 pg (ref 26.0–34.0)
MCHC: 33.3 g/dL (ref 30.0–36.0)
MCV: 85.1 fL (ref 80.0–100.0)
Platelets: 305 10*3/uL (ref 150–400)
RBC: 6.29 MIL/uL — ABNORMAL HIGH (ref 4.22–5.81)
RDW: 14.8 % (ref 11.5–15.5)
WBC: 6.3 10*3/uL (ref 4.0–10.5)
nRBC: 0 % (ref 0.0–0.2)

## 2022-05-17 LAB — COMPREHENSIVE METABOLIC PANEL
ALT: 23 U/L (ref 0–44)
AST: 31 U/L (ref 15–41)
Albumin: 3.7 g/dL (ref 3.5–5.0)
Alkaline Phosphatase: 72 U/L (ref 38–126)
Anion gap: 11 (ref 5–15)
BUN: 13 mg/dL (ref 6–20)
CO2: 25 mmol/L (ref 22–32)
Calcium: 9.6 mg/dL (ref 8.9–10.3)
Chloride: 100 mmol/L (ref 98–111)
Creatinine, Ser: 1.2 mg/dL (ref 0.61–1.24)
GFR, Estimated: >60 mL/min (ref >60)
Glucose, Bld: 127 mg/dL — ABNORMAL HIGH (ref 70–99)
Potassium: 4.1 mmol/L (ref 3.5–5.1)
Sodium: 136 mmol/L (ref 135–145)
Total Bilirubin: 0.9 mg/dL (ref 0.3–1.2)
Total Protein: 7.5 g/dL (ref 6.5–8.1)

## 2022-05-17 LAB — APTT: aPTT: 39 seconds — ABNORMAL HIGH (ref 24–36)

## 2022-05-17 LAB — TYPE AND SCREEN
ABO/RH(D): O POS
Antibody Screen: NEGATIVE

## 2022-05-17 LAB — PROTIME-INR
INR: 1.2 (ref 0.8–1.2)
Prothrombin Time: 15 seconds (ref 11.4–15.2)

## 2022-05-17 LAB — TROPONIN I (HIGH SENSITIVITY)
Troponin I (High Sensitivity): 12 ng/L (ref <18)
Troponin I (High Sensitivity): 14 ng/L (ref <18)

## 2022-05-17 LAB — GLUCOSE, CAPILLARY: Glucose-Capillary: 178 mg/dL — ABNORMAL HIGH (ref 70–99)

## 2022-05-17 LAB — HEPARIN LEVEL (UNFRACTIONATED): Heparin Unfractionated: >1.1 IU/mL — ABNORMAL HIGH (ref 0.30–0.70)

## 2022-05-17 LAB — CBG MONITORING, ED
Glucose-Capillary: 130 mg/dL — ABNORMAL HIGH (ref 70–99)
Glucose-Capillary: 134 mg/dL — ABNORMAL HIGH (ref 70–99)

## 2022-05-17 LAB — POC OCCULT BLOOD, ED: Fecal Occult Bld: POSITIVE — AB

## 2022-05-17 LAB — ABO/RH: ABO/RH(D): O POS

## 2022-05-17 MED ORDER — FUROSEMIDE 20 MG PO TABS: 80.0000 mg | ORAL_TABLET | Freq: Every day | ORAL | Status: DC

## 2022-05-17 MED ORDER — HYDRALAZINE HCL 25 MG PO TABS
25.0000 mg | ORAL_TABLET | Freq: Four times a day (QID) | ORAL | Status: DC | PRN
  Filled 2022-05-17: qty 1

## 2022-05-17 MED ORDER — HYDROCORTISONE ACETATE 25 MG RE SUPP
25.0000 mg | Freq: Two times a day (BID) | RECTAL | Status: DC
  Administered 2022-05-17: 25 mg via RECTAL
  Filled 2022-05-17 (×3): qty 1

## 2022-05-17 MED ORDER — PEG-KCL-NACL-NASULF-NA ASC-C 100 G PO SOLR: 1.0000 | Freq: Once | ORAL | Status: DC

## 2022-05-17 MED ORDER — BEMPEDOIC ACID-EZETIMIBE 180-10 MG PO TABS: 1.0000 | ORAL_TABLET | Freq: Every day | ORAL | Status: DC

## 2022-05-17 MED ORDER — DIGOXIN 125 MCG PO TABS
0.1250 mg | ORAL_TABLET | Freq: Every day | ORAL | Status: DC
  Administered 2022-05-18: 0.125 mg via ORAL
  Filled 2022-05-17: qty 1

## 2022-05-17 MED ORDER — POTASSIUM CHLORIDE 20 MEQ PO PACK
40.0000 meq | PACK | Freq: Every day | ORAL | Status: DC
  Administered 2022-05-17 – 2022-05-18 (×2): 40 meq via ORAL
  Filled 2022-05-17 (×2): qty 2

## 2022-05-17 MED ORDER — SACUBITRIL-VALSARTAN 97-103 MG PO TABS: 1.0000 | ORAL_TABLET | Freq: Two times a day (BID) | ORAL | Status: DC

## 2022-05-17 MED ORDER — INSULIN ASPART 100 UNIT/ML IJ SOLN
0.0000 [IU] | Freq: Three times a day (TID) | INTRAMUSCULAR | Status: DC
  Administered 2022-05-17 – 2022-05-18 (×2): 2 [IU] via SUBCUTANEOUS

## 2022-05-17 MED ORDER — CARVEDILOL 3.125 MG PO TABS
3.1250 mg | ORAL_TABLET | Freq: Two times a day (BID) | ORAL | Status: DC
Start: 2022-05-17 — End: 2022-05-17

## 2022-05-17 MED ORDER — HEPARIN (PORCINE) 25000 UT/250ML-% IV SOLN: 1800.0000 [IU]/h | INTRAVENOUS | Status: DC

## 2022-05-17 MED ORDER — FUROSEMIDE 40 MG PO TABS
80.0000 mg | ORAL_TABLET | Freq: Every day | ORAL | Status: DC
  Administered 2022-05-18: 80 mg via ORAL
  Filled 2022-05-17: qty 2

## 2022-05-17 MED ORDER — PEG-KCL-NACL-NASULF-NA ASC-C 100 G PO SOLR
0.5000 | Freq: Once | ORAL | Status: AC
  Administered 2022-05-17: 100 g via ORAL
  Filled 2022-05-17: qty 1

## 2022-05-17 MED ORDER — LINAGLIPTIN 5 MG PO TABS
5.0000 mg | ORAL_TABLET | Freq: Every day | ORAL | Status: DC
  Administered 2022-05-17 – 2022-05-18 (×2): 5 mg via ORAL
  Filled 2022-05-17 (×2): qty 1

## 2022-05-17 MED ORDER — SPIRONOLACTONE 25 MG PO TABS: 25.0000 mg | ORAL_TABLET | Freq: Every day | ORAL | Status: DC

## 2022-05-17 MED ORDER — HEPARIN (PORCINE) 25000 UT/250ML-% IV SOLN
1750.0000 [IU]/h | INTRAVENOUS | Status: AC
  Administered 2022-05-17: 1750 [IU]/h via INTRAVENOUS

## 2022-05-17 MED ORDER — DAPAGLIFLOZIN PROPANEDIOL 10 MG PO TABS
10.0000 mg | ORAL_TABLET | Freq: Every day | ORAL | Status: DC
  Administered 2022-05-18: 10 mg via ORAL
  Filled 2022-05-17: qty 1

## 2022-05-17 MED ORDER — HEPARIN (PORCINE) 25000 UT/250ML-% IV SOLN: 1750.0000 [IU]/h | INTRAVENOUS | Status: DC

## 2022-05-17 MED ORDER — HEPARIN (PORCINE) 25000 UT/250ML-% IV SOLN
1750.0000 [IU]/h | INTRAVENOUS | Status: DC
  Filled 2022-05-17: qty 250

## 2022-05-17 MED ORDER — METFORMIN HCL 500 MG PO TABS: 500.0000 mg | ORAL_TABLET | Freq: Two times a day (BID) | ORAL | Status: DC

## 2022-05-17 MED ORDER — ROSUVASTATIN CALCIUM 20 MG PO TABS
40.0000 mg | ORAL_TABLET | Freq: Every day | ORAL | Status: DC
  Administered 2022-05-18: 40 mg via ORAL
  Filled 2022-05-17: qty 2

## 2022-05-17 NOTE — ED Notes (Signed)
Charge nurse Ryan aware of the reason for the delay in the patient's transfer to the unit.

## 2022-05-17 NOTE — ED Notes (Signed)
Provider at bedside at this time and reports that the benefits of providing heparin outweigh the potential risks.

## 2022-05-17 NOTE — Consult Note (Addendum)
Attending physician's note   I have taken a history, reviewed the chart, and examined the patient. I performed a substantive portion of this encounter, including complete performance of at least one of the key components, in conjunction with the APP. I agree with the APP's note, impression, and recommendations with my edits.   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  No associated abdominal pain, nausea/vomiting.  No prior similar symptoms.  Has had a couple episodes of BRBPR.  Exam in ER without hemorrhoids.  Admission evaluation notable for the following: - H/H 17.8/53.5 (at baseline), PLT 305 - BG 127, otherwise normal CMP - FOBT positive - INR 1.2  1) Hematochezia 2) Heme positive stool 3) CHF with reduced EF at 20% 4) Cardiomyopathy 5) History of PE 6) Chronic anticoagulation 7) LV mural thrombus  Discussed potential etiologies for acute onset BRBPR with patient and family members today.  Thankfully no associated anemia at this point, and otherwise hemodynamically stable.  Discussed the role/utility of colonoscopy to evaluate for proximal etiology.  He has significant concerns about his hematochezia, particularly in the setting of need for anticoagulation.  Given elevated patient concerns, significant comorbidities that would require any endoscopic procedures to be performed in the hospital setting, current inability to hold anticoagulation, out of an abundance of caution will elect to perform inpatient colonoscopy rather than deferring to outpatient.  - Clear liquid diet with n.p.o. at midnight for procedure tomorrow - Holding Eliquis - We will defer decision for heparin GGT to primary service.  He is technically currently anticoagulated as he took his Eliquis earlier today.  If  planning to proceed with heparin gtt, please hold 5 hours prior to colonoscopy (currently scheduled for 9 AM tomorrow) - Starting bowel prep - Colonoscopy tomorrow at 9:00 with Dr. Lorenso Courier who will assume his ongoing inpatient care starting tomorrow morning - Serial H/H checks  Gerrit Heck, DO, FACG [phone] office           Consultation  Referring Provider:   Dr. Roosevelt Locks   Primary Care Physician:  Ellene Route Primary Gastroenterologist:  Althia Forts       Reason for Consultation: Rectal bleeding             HPI:   Handsome Anglin is a 26 y.o. 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    Today, the patient describes that his symptoms started yesterday and he noticed some bright red blood when wiping after a bowel movement as well as some at the bottom of the toilet.  This morning he had a recurrence of the same, again with a bowel movement.  He was always told that if he ever sees bleeding since he is on a blood thinner that he needs to come to the hospital.  He explains that he has been having some diarrhea off and on related to Metformin which was just decreased to 500 mg twice a day from 1000 mg.  Does describe his last 2 bowel movements as loose.  Denies abdominal or rectal pain.  Does tell me he used to have  trouble with rectal bleeding when he was younger, but this was never fully evaluated.      His last dose of Eliquis was this morning, he has not had much to eat today just a few bites off of everyone else's plates.      Denies fever, chills, weight loss, abdominal pain, nausea, vomiting, heartburn or reflux.  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 05/2021 noted during periods of sleep @ time of hospitalization for PE.   Cardiomyopathy ([place])    a. 05/2021 Echo: EF <20%, glob HK. [place]:C ratio of 2.1:1 suggesting non-compaction. Gr2 DD. Sev reduced RV fxn. Nl PASP. Mildly dil RA. Mild MR; b. 10/2021 Echo: EF <20%, GrII DD; c. 10/2021 TEE: EF 20-25%, glob HK, sev red RV fxn.   Diabetes mellitus without complication ([place])    HFrEF (heart failure with reduced ejection fraction) ([place])    a. 05/2021 Echo: EF <20%. ? non-compaction.   Hypertension    Pulmonary embolism ([place])    a. 05/2021 CTA Chest: PE extending from lobar arteris of RUL and RLL w/ concern for pulml infarct-->s/p thrombolysis and thrombectomy of RUL/RML/RLL.   Stroke [facility])    a. 10/2021 CT Head: Bilat cerebral infarcts w/ mod-sided acute to subacute R temporoparietal infarct; b. 10/2021 MRI: R post PCA territory infarcts w/o hemorrhage; c. 10/2021 TEE: Neg bubble study. No LA/LAA thrombus. EF 20-25%, glob HK.    Past Surgical History:  Procedure Laterality Date   HAND SURGERY Left age 58   HAND SURGERY     PULMONARY THROMBECTOMY N/A 06/28/2021   Procedure: PULMONARY THROMBECTOMY;  Surgeon: Algernon Huxley, MD;  Location: [facility];  Service: Cardiovascular;  Laterality: N/A;   TEE WITHOUT CARDIOVERSION N/A 11/08/2021   Procedure: TRANSESOPHAGEAL ECHOCARDIOGRAM (TEE);  Surgeon: Kate Sable, MD;  Location: [facility] ORS;  Service: Cardiovascular;  Laterality: N/A;    Family History  Problem Relation Age of Onset   Heart failure Mother    Cerebrovascular Accident Mother    Diabetes Father    Hypertension Father     Social History   Tobacco Use   Smoking status: Never   Smokeless tobacco: Never  Vaping Use   Vaping Use: Never used  Substance Use Topics   Alcohol use: Not Currently    Comment: special occasions   Drug use: Not  Currently    Types: Marijuana    Comment: last month    Prior to Admission medications   Medication Sig Start Date End Date Taking? Authorizing Provider  apixaban (ELIQUIS) 5 MG TABS tablet Take 1 tablet (5 mg total) by mouth 2 (two) times daily. 04/25/22  Yes Bensimhon, Shaune Pascal, MD  Bempedoic Acid-Ezetimibe (NEXLIZET) 180-10 MG TABS Take 1 tablet by mouth daily. 05/01/22  Yes Bensimhon, Shaune Pascal, MD  BIOTIN PO Take 1 capsule by mouth daily.   Yes [provider]  digoxin (LANOXIN) 0.125 MG tablet Take 1 tablet (0.125 mg total) by mouth daily. 04/25/22  Yes Bensimhon, Shaune Pascal, MD  FARXIGA 10 MG TABS tablet Take 10 mg by mouth daily. 04/21/22  Yes [provider]  furosemide (LASIX) 40 MG tablet Take 2 tablets (80 mg total) by mouth daily. 04/25/22  Yes Bensimhon, Shaune Pascal, MD  metFORMIN (GLUCOPHAGE) 500 MG tablet Take 1 tablet (  500 mg total) by mouth 2 (two) times daily with a meal. 03/22/22 04/26/23 Yes Arrien, Jimmy Picket, MD  Multiple Vitamin (MULTIVITAMIN WITH MINERALS) TABS tablet Take 1 tablet by mouth daily.   Yes [provider]  potassium chloride (KLOR-CON) 20 MEQ packet Take 40 mEq by mouth daily. Take on days you take Lasix. 04/25/22  Yes Bensimhon, Shaune Pascal, MD  rosuvastatin (CRESTOR) 40 MG tablet Take 1 tablet (40 mg total) by mouth daily. 03/29/22  Yes Bensimhon, Shaune Pascal, MD  sacubitril-valsartan (ENTRESTO) 97-103 MG Take 1 tablet by mouth 2 (two) times daily. 04/25/22  Yes Bensimhon, Shaune Pascal, MD  Semaglutide,0.25 or 0.5MG/DOS, (OZEMPIC, 0.25 OR 0.5 MG/DOSE,) 2 MG/3ML SOPN Inject 0.5 mg into the skin once a week. Patient taking differently: Inject 0.5 mg into the skin every Thursday. 04/30/22  Yes Bensimhon, Shaune Pascal, MD  spironolactone (ALDACTONE) 25 MG tablet Take 1 tablet (25 mg total) by mouth daily. 04/25/22  Yes Bensimhon, Shaune Pascal, MD  Blood Glucose Monitoring Suppl (BLOOD GLUCOSE MONITOR SYSTEM) w/Device KIT Use up to four times daily as directed.  03/21/22   Arrien, Jimmy Picket, MD  carvedilol (COREG) 3.125 MG tablet Take 3.125 mg by mouth 2 (two) times daily. Patient not taking: Reported on 05/17/2022 04/06/22   [provider]    Current Facility-Administered Medications  Medication Dose Route Frequency Provider Last Rate Last Admin   Bempedoic Acid-Ezetimibe 180-10 MG TABS 1 tablet  1 tablet Oral Daily Wynetta Fines T, MD       dapagliflozin propanediol (FARXIGA) tablet 10 mg  10 mg Oral Daily Wynetta Fines T, MD       [START ON 05/18/2022] digoxin (LANOXIN) tablet 0.125 mg  0.125 mg Oral Daily Lequita Halt, MD       [START ON 05/18/2022] furosemide (LASIX) tablet 80 mg  80 mg Oral Daily Wynetta Fines T, MD       heparin ADULT infusion 100 units/mL (25000 units/249m)  1,750 Units/hr Intravenous Continuous ZWynetta FinesT, MD       hydrALAZINE (APRESOLINE) tablet 25 mg  25 mg Oral Q6H PRN ZLequita Halt MD       hydrocortisone (ANUSOL-HC) suppository 25 mg  25 mg Rectal BID ZWynetta FinesT, MD       insulin aspart (novoLOG) injection 0-15 Units  0-15 Units Subcutaneous TID WC ZLequita Halt MD       linagliptin (TRADJENTA) tablet 5 mg  5 mg Oral Daily ZWynetta FinesT, MD       potassium chloride (KLOR-CON) packet 40 mEq  40 mEq Oral Daily ZLequita Halt MD       [START ON 05/18/2022] rosuvastatin (CRESTOR) tablet 40 mg  40 mg Oral Daily ZLequita Halt MD       Current Outpatient Medications  Medication Sig Dispense Refill   apixaban (ELIQUIS) 5 MG TABS tablet Take 1 tablet (5 mg total) by mouth 2 (two) times daily. 60 tablet 6   Bempedoic Acid-Ezetimibe (NEXLIZET) 180-10 MG TABS Take 1 tablet by mouth daily. 90 tablet 3   BIOTIN PO Take 1 capsule by mouth daily.     digoxin (LANOXIN) 0.125 MG tablet Take 1 tablet (0.125 mg total) by mouth daily. 30 tablet 6   FARXIGA 10 MG TABS tablet Take 10 mg by mouth daily.     furosemide (LASIX) 40 MG tablet Take 2 tablets (80 mg total) by mouth daily. 60 tablet 6   metFORMIN (GLUCOPHAGE) 500 MG  tablet  Take 1 tablet (500 mg total) by mouth 2 (two) times daily with a meal. 60 tablet 0   Multiple Vitamin (MULTIVITAMIN WITH MINERALS) TABS tablet Take 1 tablet by mouth daily.     potassium chloride (KLOR-CON) 20 MEQ packet Take 40 mEq by mouth daily. Take on days you take Lasix. 100 packet 6   rosuvastatin (CRESTOR) 40 MG tablet Take 1 tablet (40 mg total) by mouth daily. 90 tablet 3   sacubitril-valsartan (ENTRESTO) 97-103 MG Take 1 tablet by mouth 2 (two) times daily. 60 tablet 6   Semaglutide,0.25 or 0.5MG/DOS, (OZEMPIC, 0.25 OR 0.5 MG/DOSE,) 2 MG/3ML SOPN Inject 0.5 mg into the skin once a week. (Patient taking differently: Inject 0.5 mg into the skin every Thursday.) 3 mL 0   spironolactone (ALDACTONE) 25 MG tablet Take 1 tablet (25 mg total) by mouth daily. 30 tablet 6   Blood Glucose Monitoring Suppl (BLOOD GLUCOSE MONITOR SYSTEM) w/Device KIT Use up to four times daily as directed. 1 kit 3   carvedilol (COREG) 3.125 MG tablet Take 3.125 mg by mouth 2 (two) times daily. (Patient not taking: Reported on 05/17/2022)      Allergies as of 05/17/2022   (No Known Allergies)     Review of Systems:    Constitutional: No weight loss, fever or chills Skin: No rash  Cardiovascular: No chest pain   Respiratory: No SOB  Gastrointestinal: See HPI and otherwise negative Genitourinary: No dysuria  Neurological: No headache, dizziness or syncope Musculoskeletal: No new muscle or joint pain Hematologic: No bruising Psychiatric: No history of depression or anxiety    Physical Exam:  Vital signs in last 24 hours: Temp:  [98.7 F (37.1 C)] 98.7 F (37.1 C) (05/18 1228) Pulse Rate:  [57-82] 82 (05/18 1555) Resp:  [16-20] 20 (05/18 1526) BP: (104-146)/(70-120) 146/120 (05/18 1550) SpO2:  [97 %-100 %] 97 % (05/18 1555) Weight:  [143.8 kg] 143.8 kg (05/18 1304)   General:   Pleasant obese AA appears to be in NAD, Well developed, Well nourished, alert and cooperative Head:  Normocephalic and  atraumatic. Eyes:   PEERL, EOMI. No icterus. Conjunctiva pink. Ears:  Normal auditory acuity. Neck:  Supple Throat: Oral cavity and pharynx without inflammation, swelling or lesion. Teeth in good condition. Lungs: Respirations even and unlabored. Lungs clear to auscultation bilaterally.   No wheezes, crackles, or rhonchi.  Heart: Normal S1, S2. No MRG. Regular rate and rhythm. No peripheral edema, cyanosis or pallor.  Abdomen:  Soft, nondistended, nontender. No rebound or guarding. Normal bowel sounds. No appreciable masses or hepatomegaly. Rectal:  By ED physician no hemorrhoids Msk:  Symmetrical without gross deformities. Peripheral pulses intact.  Extremities:  Without edema, no deformity or joint abnormality. Normal ROM, normal sensation. Neurologic:  Alert and  oriented x4;  grossly normal neurologically.  Skin:   Dry and intact without significant lesions or rashes. Psychiatric: Demonstrates good judgement and reason without abnormal affect or behaviors.  LAB RESULTS: Recent Labs    05/17/22 1239  WBC 6.3  HGB 17.8*  HCT 53.5*  PLT 305   BMET Recent Labs    05/17/22 1239  NA 136  K 4.1  CL 100  CO2 25  GLUCOSE 127*  BUN 13  CREATININE 1.20  CALCIUM 9.6   LFT Recent Labs    05/17/22 1239  PROT 7.5  ALBUMIN 3.7  AST 31  ALT 23  ALKPHOS 72  BILITOT 0.9   PT/INR Recent Labs    05/17/22 1323  LABPROT 15.0  INR 1.2    STUDIES: DG Chest 2 View  Result Date: 05/17/2022 CLINICAL DATA:  Provided history: Chest pain. EXAM: CHEST - 2 VIEW COMPARISON:  Chest radiographs 03/13/2022 and earlier. Chest CT 03/13/2022. FINDINGS: Cardiomegaly. No appreciable airspace consolidation or pulmonary edema. No evidence of pleural effusion or pneumothorax. No acute bony abnormality identified. IMPRESSION: Cardiomegaly. No radiographic evidence of acute cardiopulmonary abnormality. Please refer to the recent prior chest CT of 03/13/2022 for a description of pulmonary emboli.  Electronically Signed   By: Kellie Simmering D.O.   On: 05/17/2022 13:00     Impression / Plan:   Impression: 1.  Rectal bleeding: 2 episodes of rectal bleeding on the toilet paper as well as in the bottom of the toilet, last episode was this morning, looser stool than normal, no rectal or abdominal pain, last dose of Eliquis this morning; likely internal hemorrhoids but need to consider other causes given patient's need to stay on anticoagulation 2.  History of PE: On Eliquis 3.  LV mural thrombosis: History of stroke 4.  Chronic combined systolic and diastolic CHF  Plan: 1.  Agree with holding Eliquis, per hospitalist he will be started on heparin drip given history of significant thrombosis burden with acute cor pulmonale and emergency thrombectomy.  Discussed with hospitalist this will need to be held starting at 4 AM tomorrow which is 5 hours before scheduled colonoscopy. 2.  Scheduled patient for colonoscopy in the [facility] with Dr. Lorenso Courier tomorrow at 9 am.  We discussed risks, benefits, limitations and alternatives and patient agrees to proceed. 3.  Patient to be on a clear diet today and n.p.o. at midnight 4.  Start the patient on MoviPrep in split dose fashion 5.  Continue to monitor hemoglobin with transfusion as needed less than 7  Thank you for your kind consultation, we will continue to follow.  Lavone Nian Lemmon  05/17/2022, 4:00 PM

## 2022-05-17 NOTE — H&P (View-Only) (Signed)
  Attending physician's note   I have taken a history, reviewed the chart, and examined the patient. I performed a substantive portion of this encounter, including complete performance of at least one of the key components, in conjunction with the APP. I agree with the APP's note, impression, and recommendations with my edits.   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  No associated abdominal pain, nausea/vomiting.  No prior similar symptoms.  Has had a couple episodes of BRBPR.  Exam in ER without hemorrhoids.  Admission evaluation notable for the following: - H/H 17.8/53.5 (at baseline), PLT 305 - BG 127, otherwise normal CMP - FOBT positive - INR 1.2  1) Hematochezia 2) Heme positive stool 3) CHF with reduced EF at 20% 4) Cardiomyopathy 5) History of PE 6) Chronic anticoagulation 7) LV mural thrombus  Discussed potential etiologies for acute onset BRBPR with patient and family members today.  Thankfully no associated anemia at this point, and otherwise hemodynamically stable.  Discussed the role/utility of colonoscopy to evaluate for proximal etiology.  He has significant concerns about his hematochezia, particularly in the setting of need for anticoagulation.  Given elevated patient concerns, significant comorbidities that would require any endoscopic procedures to be performed in the hospital setting, current inability to hold anticoagulation, out of an abundance of caution will elect to perform inpatient colonoscopy rather than deferring to outpatient.  - Clear liquid diet with n.p.o. at midnight for procedure tomorrow - Holding Eliquis - We will defer decision for heparin GGT to primary service.  He is technically currently anticoagulated as he took his Eliquis earlier today.  If  planning to proceed with heparin gtt, please hold 5 hours prior to colonoscopy (currently scheduled for 9 AM tomorrow) - Starting bowel prep - Colonoscopy tomorrow at 9:00 with Dr. Dorsey who will assume his ongoing inpatient care starting tomorrow morning - Serial H/H checks  Robert Belay, DO, FACG [phone] office           Consultation  Referring Provider:   Dr. Zhang   Primary Care Physician:  [facility] Primary Gastroenterologist:  Unassigned       Reason for Consultation: Rectal bleeding             HPI:   Robert Mccann is a 25 y.o. 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    Today, the patient describes that his symptoms started yesterday and he noticed some bright red blood when wiping after a bowel movement as well as some at the bottom of the toilet.  This morning he had a recurrence of the same, again with a bowel movement.  He was always told that if he ever sees bleeding since he is on a blood thinner that he needs to come to the hospital.  He explains that he has been having some diarrhea off and on related to Metformin which was just decreased to 500 mg twice a day from 1000 mg.  Does describe his last 2 bowel movements as loose.  Denies abdominal or rectal pain.  Does tell me he used to have   trouble with rectal bleeding when he was younger, but this was never fully evaluated.      His last dose of Eliquis was this morning, he has not had much to eat today just a few bites off of everyone else's plates.      Denies fever, chills, weight loss, abdominal pain, nausea, vomiting, heartburn or reflux.  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 05/2021 noted during periods of sleep @ time of hospitalization for PE.   Cardiomyopathy ([place])    a. 05/2021 Echo: EF <20%, glob HK. [place]:C ratio of 2.1:1 suggesting non-compaction. Gr2 DD. Sev reduced RV fxn. Nl PASP. Mildly dil RA. Mild MR; b. 10/2021 Echo: EF <20%, GrII DD; c. 10/2021 TEE: EF 20-25%, glob HK, sev red RV fxn.   Diabetes mellitus without complication ([place])    HFrEF (heart failure with reduced ejection fraction) ([place])    a. 05/2021 Echo: EF <20%. ? non-compaction.   Hypertension    Pulmonary embolism ([place])    a. 05/2021 CTA Chest: PE extending from lobar arteris of RUL and RLL w/ concern for pulml infarct-->s/p thrombolysis and thrombectomy of RUL/RML/RLL.   Stroke [facility])    a. 10/2021 CT Head: Bilat cerebral infarcts w/ mod-sided acute to subacute R temporoparietal infarct; b. 10/2021 MRI: R post PCA territory infarcts w/o hemorrhage; c. 10/2021 TEE: Neg bubble study. No LA/LAA thrombus. EF 20-25%, glob HK.    Past Surgical History:  Procedure Laterality Date   HAND SURGERY Left age 58   HAND SURGERY     PULMONARY THROMBECTOMY N/A 06/28/2021   Procedure: PULMONARY THROMBECTOMY;  Surgeon: Algernon Huxley, MD;  Location: [facility];  Service: Cardiovascular;  Laterality: N/A;   TEE WITHOUT CARDIOVERSION N/A 11/08/2021   Procedure: TRANSESOPHAGEAL ECHOCARDIOGRAM (TEE);  Surgeon: Kate Sable, MD;  Location: [facility] ORS;  Service: Cardiovascular;  Laterality: N/A;    Family History  Problem Relation Age of Onset   Heart failure Mother    Cerebrovascular Accident Mother    Diabetes Father    Hypertension Father     Social History   Tobacco Use   Smoking status: Never   Smokeless tobacco: Never  Vaping Use   Vaping Use: Never used  Substance Use Topics   Alcohol use: Not Currently    Comment: special occasions   Drug use: Not  Currently    Types: Marijuana    Comment: last month    Prior to Admission medications   Medication Sig Start Date End Date Taking? Authorizing Provider  apixaban (ELIQUIS) 5 MG TABS tablet Take 1 tablet (5 mg total) by mouth 2 (two) times daily. 04/25/22  Yes Bensimhon, Shaune Pascal, MD  Bempedoic Acid-Ezetimibe (NEXLIZET) 180-10 MG TABS Take 1 tablet by mouth daily. 05/01/22  Yes Bensimhon, Shaune Pascal, MD  BIOTIN PO Take 1 capsule by mouth daily.   Yes [provider]  digoxin (LANOXIN) 0.125 MG tablet Take 1 tablet (0.125 mg total) by mouth daily. 04/25/22  Yes Bensimhon, Shaune Pascal, MD  FARXIGA 10 MG TABS tablet Take 10 mg by mouth daily. 04/21/22  Yes [provider]  furosemide (LASIX) 40 MG tablet Take 2 tablets (80 mg total) by mouth daily. 04/25/22  Yes Bensimhon, Shaune Pascal, MD  metFORMIN (GLUCOPHAGE) 500 MG tablet Take 1 tablet (  500 mg total) by mouth 2 (two) times daily with a meal. 03/22/22 04/26/23 Yes Arrien, Jimmy Picket, MD  Multiple Vitamin (MULTIVITAMIN WITH MINERALS) TABS tablet Take 1 tablet by mouth daily.   Yes [provider]  potassium chloride (KLOR-CON) 20 MEQ packet Take 40 mEq by mouth daily. Take on days you take Lasix. 04/25/22  Yes Bensimhon, Shaune Pascal, MD  rosuvastatin (CRESTOR) 40 MG tablet Take 1 tablet (40 mg total) by mouth daily. 03/29/22  Yes Bensimhon, Shaune Pascal, MD  sacubitril-valsartan (ENTRESTO) 97-103 MG Take 1 tablet by mouth 2 (two) times daily. 04/25/22  Yes Bensimhon, Shaune Pascal, MD  Semaglutide,0.25 or 0.5MG/DOS, (OZEMPIC, 0.25 OR 0.5 MG/DOSE,) 2 MG/3ML SOPN Inject 0.5 mg into the skin once a week. Patient taking differently: Inject 0.5 mg into the skin every Thursday. 04/30/22  Yes Bensimhon, Shaune Pascal, MD  spironolactone (ALDACTONE) 25 MG tablet Take 1 tablet (25 mg total) by mouth daily. 04/25/22  Yes Bensimhon, Shaune Pascal, MD  Blood Glucose Monitoring Suppl (BLOOD GLUCOSE MONITOR SYSTEM) w/Device KIT Use up to four times daily as directed.  03/21/22   Arrien, Jimmy Picket, MD  carvedilol (COREG) 3.125 MG tablet Take 3.125 mg by mouth 2 (two) times daily. Patient not taking: Reported on 05/17/2022 04/06/22   [provider]    Current Facility-Administered Medications  Medication Dose Route Frequency Provider Last Rate Last Admin   Bempedoic Acid-Ezetimibe 180-10 MG TABS 1 tablet  1 tablet Oral Daily Wynetta Fines T, MD       dapagliflozin propanediol (FARXIGA) tablet 10 mg  10 mg Oral Daily Wynetta Fines T, MD       [START ON 05/18/2022] digoxin (LANOXIN) tablet 0.125 mg  0.125 mg Oral Daily Lequita Halt, MD       [START ON 05/18/2022] furosemide (LASIX) tablet 80 mg  80 mg Oral Daily Wynetta Fines T, MD       heparin ADULT infusion 100 units/mL (25000 units/285m)  1,750 Units/hr Intravenous Continuous ZWynetta FinesT, MD       hydrALAZINE (APRESOLINE) tablet 25 mg  25 mg Oral Q6H PRN ZLequita Halt MD       hydrocortisone (ANUSOL-HC) suppository 25 mg  25 mg Rectal BID ZWynetta FinesT, MD       insulin aspart (novoLOG) injection 0-15 Units  0-15 Units Subcutaneous TID WC ZLequita Halt MD       linagliptin (TRADJENTA) tablet 5 mg  5 mg Oral Daily ZWynetta FinesT, MD       potassium chloride (KLOR-CON) packet 40 mEq  40 mEq Oral Daily ZLequita Halt MD       [START ON 05/18/2022] rosuvastatin (CRESTOR) tablet 40 mg  40 mg Oral Daily ZLequita Halt MD       Current Outpatient Medications  Medication Sig Dispense Refill   apixaban (ELIQUIS) 5 MG TABS tablet Take 1 tablet (5 mg total) by mouth 2 (two) times daily. 60 tablet 6   Bempedoic Acid-Ezetimibe (NEXLIZET) 180-10 MG TABS Take 1 tablet by mouth daily. 90 tablet 3   BIOTIN PO Take 1 capsule by mouth daily.     digoxin (LANOXIN) 0.125 MG tablet Take 1 tablet (0.125 mg total) by mouth daily. 30 tablet 6   FARXIGA 10 MG TABS tablet Take 10 mg by mouth daily.     furosemide (LASIX) 40 MG tablet Take 2 tablets (80 mg total) by mouth daily. 60 tablet 6   metFORMIN (GLUCOPHAGE) 500 MG  tablet  Take 1 tablet (500 mg total) by mouth 2 (two) times daily with a meal. 60 tablet 0   Multiple Vitamin (MULTIVITAMIN WITH MINERALS) TABS tablet Take 1 tablet by mouth daily.     potassium chloride (KLOR-CON) 20 MEQ packet Take 40 mEq by mouth daily. Take on days you take Lasix. 100 packet 6   rosuvastatin (CRESTOR) 40 MG tablet Take 1 tablet (40 mg total) by mouth daily. 90 tablet 3   sacubitril-valsartan (ENTRESTO) 97-103 MG Take 1 tablet by mouth 2 (two) times daily. 60 tablet 6   Semaglutide,0.25 or 0.5MG/DOS, (OZEMPIC, 0.25 OR 0.5 MG/DOSE,) 2 MG/3ML SOPN Inject 0.5 mg into the skin once a week. (Patient taking differently: Inject 0.5 mg into the skin every Thursday.) 3 mL 0   spironolactone (ALDACTONE) 25 MG tablet Take 1 tablet (25 mg total) by mouth daily. 30 tablet 6   Blood Glucose Monitoring Suppl (BLOOD GLUCOSE MONITOR SYSTEM) w/Device KIT Use up to four times daily as directed. 1 kit 3   carvedilol (COREG) 3.125 MG tablet Take 3.125 mg by mouth 2 (two) times daily. (Patient not taking: Reported on 05/17/2022)      Allergies as of 05/17/2022   (No Known Allergies)     Review of Systems:    Constitutional: No weight loss, fever or chills Skin: No rash  Cardiovascular: No chest pain   Respiratory: No SOB  Gastrointestinal: See HPI and otherwise negative Genitourinary: No dysuria  Neurological: No headache, dizziness or syncope Musculoskeletal: No new muscle or joint pain Hematologic: No bruising Psychiatric: No history of depression or anxiety    Physical Exam:  Vital signs in last 24 hours: Temp:  [98.7 F (37.1 C)] 98.7 F (37.1 C) (05/18 1228) Pulse Rate:  [57-82] 82 (05/18 1555) Resp:  [16-20] 20 (05/18 1526) BP: (104-146)/(70-120) 146/120 (05/18 1550) SpO2:  [97 %-100 %] 97 % (05/18 1555) Weight:  [143.8 kg] 143.8 kg (05/18 1304)   General:   Pleasant obese AA appears to be in NAD, Well developed, Well nourished, alert and cooperative Head:  Normocephalic and  atraumatic. Eyes:   PEERL, EOMI. No icterus. Conjunctiva pink. Ears:  Normal auditory acuity. Neck:  Supple Throat: Oral cavity and pharynx without inflammation, swelling or lesion. Teeth in good condition. Lungs: Respirations even and unlabored. Lungs clear to auscultation bilaterally.   No wheezes, crackles, or rhonchi.  Heart: Normal S1, S2. No MRG. Regular rate and rhythm. No peripheral edema, cyanosis or pallor.  Abdomen:  Soft, nondistended, nontender. No rebound or guarding. Normal bowel sounds. No appreciable masses or hepatomegaly. Rectal:  By ED physician no hemorrhoids Msk:  Symmetrical without gross deformities. Peripheral pulses intact.  Extremities:  Without edema, no deformity or joint abnormality. Normal ROM, normal sensation. Neurologic:  Alert and  oriented x4;  grossly normal neurologically.  Skin:   Dry and intact without significant lesions or rashes. Psychiatric: Demonstrates good judgement and reason without abnormal affect or behaviors.  LAB RESULTS: Recent Labs    05/17/22 1239  WBC 6.3  HGB 17.8*  HCT 53.5*  PLT 305   BMET Recent Labs    05/17/22 1239  NA 136  K 4.1  CL 100  CO2 25  GLUCOSE 127*  BUN 13  CREATININE 1.20  CALCIUM 9.6   LFT Recent Labs    05/17/22 1239  PROT 7.5  ALBUMIN 3.7  AST 31  ALT 23  ALKPHOS 72  BILITOT 0.9   PT/INR Recent Labs    05/17/22 1323    LABPROT 15.0  INR 1.2    STUDIES: DG Chest 2 View  Result Date: 05/17/2022 CLINICAL DATA:  Provided history: Chest pain. EXAM: CHEST - 2 VIEW COMPARISON:  Chest radiographs 03/13/2022 and earlier. Chest CT 03/13/2022. FINDINGS: Cardiomegaly. No appreciable airspace consolidation or pulmonary edema. No evidence of pleural effusion or pneumothorax. No acute bony abnormality identified. IMPRESSION: Cardiomegaly. No radiographic evidence of acute cardiopulmonary abnormality. Please refer to the recent prior chest CT of 03/13/2022 for a description of pulmonary emboli.  Electronically Signed   By: Kyle  Golden D.O.   On: 05/17/2022 13:00     Impression / Plan:   Impression: 1.  Rectal bleeding: 2 episodes of rectal bleeding on the toilet paper as well as in the bottom of the toilet, last episode was this morning, looser stool than normal, no rectal or abdominal pain, last dose of Eliquis this morning; likely internal hemorrhoids but need to consider other causes given patient's need to stay on anticoagulation 2.  History of PE: On Eliquis 3.  LV mural thrombosis: History of stroke 4.  Chronic combined systolic and diastolic CHF  Plan: 1.  Agree with holding Eliquis, per hospitalist he will be started on heparin drip given history of significant thrombosis burden with acute cor pulmonale and emergency thrombectomy.  Discussed with hospitalist this will need to be held starting at 4 AM tomorrow which is 5 hours before scheduled colonoscopy. 2.  Scheduled patient for colonoscopy in the [facility] with Dr. Dorsey tomorrow at 9 am.  We discussed risks, benefits, limitations and alternatives and patient agrees to proceed. 3.  Patient to be on a clear diet today and n.p.o. at midnight 4.  Start the patient on MoviPrep in split dose fashion 5.  Continue to monitor hemoglobin with transfusion as needed less than 7  Thank you for your kind consultation, we will continue to follow.  Jennifer Lynne Lemmon  05/17/2022, 4:00 PM    

## 2022-05-17 NOTE — ED Provider Notes (Signed)
[facility] EMERGENCY DEPARTMENT Provider Note   CSN: [id number] Arrival date & time: 05/17/22  1217     History  Chief Complaint  Patient presents with   Rectal Bleeding    Robert Mccann is a 26 y.o. male.  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  He states that he has had 2 episodes of bright red blood per rectum over the past 2 days.  He describes as having a bowel movement and then having bright red blood pooling in the bottom of the toilet.  He states that he also had bright red blood when he wiped.  He states that yesterday he also felt lightheaded.  He also endorses chest pain since the beginning of the week.  He denies any shortness of breath, syncope, abdominal pain, nausea or vomiting.  Has been taking his Eliquis as prescribed.  Denies previous abdominal surgeries.   Rectal Bleeding Associated symptoms: light-headedness   Associated symptoms: no abdominal pain and no vomiting       Home Medications Prior to Admission medications   Medication Sig Start Date End Date Taking? Authorizing Provider  apixaban (ELIQUIS) 5 MG TABS tablet Take 1 tablet (5 mg total) by mouth 2 (two) times daily. 04/25/22   Bensimhon, Shaune Pascal, MD  Bempedoic Acid-Ezetimibe (NEXLIZET) 180-10 MG TABS Take 1 tablet by mouth daily. 05/01/22   Bensimhon, Shaune Pascal, MD  Blood Glucose Monitoring Suppl (BLOOD GLUCOSE MONITOR SYSTEM) w/Device KIT Use up to four times daily as directed. 03/21/22   Arrien, Jimmy Picket, MD  Blood Glucose Monitoring Suppl (CONTOUR NEXT ONE) DEVI Use up to four times daily as directed 03/21/22   Arrien, Jimmy Picket, MD  digoxin (LANOXIN) 0.125 MG tablet Take 1 tablet (0.125 mg total) by mouth daily. 04/25/22   Bensimhon, Shaune Pascal, MD  furosemide (LASIX) 40 MG tablet Take 2 tablets (80 mg total) by mouth daily. 04/25/22   Bensimhon, Shaune Pascal, MD  glucose blood (CONTOUR NEXT TEST) test strip Use as directed up to 4 times daily. 03/21/22   Arrien, Jimmy Picket, MD  metFORMIN (GLUCOPHAGE) 500 MG tablet Take 1 tablet (500 mg total) by mouth 2 (two) times daily with a meal. 03/22/22 04/26/23  Arrien, Jimmy Picket, MD  potassium chloride (KLOR-CON) 20 MEQ packet Take 40 mEq by mouth daily. Take on days you take Lasix. 04/25/22   Bensimhon, Shaune Pascal, MD  rosuvastatin (CRESTOR) 40 MG tablet Take 1 tablet (40 mg total) by mouth daily. 03/29/22   Bensimhon, Shaune Pascal, MD  sacubitril-valsartan (ENTRESTO) 97-103 MG Take 1 tablet by mouth 2 (two) times daily. 04/25/22   Bensimhon, Shaune Pascal, MD  Semaglutide,0.25 or 0.5MG/DOS, (OZEMPIC, 0.25 OR 0.5 MG/DOSE,) 2 MG/3ML SOPN Inject 0.5 mg into the skin once a week. 04/30/22   Bensimhon, Shaune Pascal, MD  spironolactone (ALDACTONE) 25 MG tablet Take 1 tablet (25 mg total) by mouth daily. 04/25/22   Bensimhon, Shaune Pascal, MD      Allergies    Patient has no known allergies.    Review of Systems   Review of Systems  Respiratory:  Negative for shortness of breath.   Cardiovascular:  Positive for chest pain.  Gastrointestinal:  Positive for blood in stool, diarrhea and hematochezia. Negative for abdominal pain, nausea and vomiting.  Genitourinary:  Negative for dysuria and hematuria.  Neurological:  Positive for light-headedness. Negative for syncope.  All other systems reviewed and are negative.  Physical Exam Updated Vital Signs BP 112/77 (BP Location: Right Arm)   Pulse (!) 57   Temp 98.7 F (37.1 C) (Oral)   Resp 16   Ht _0  (1.803 m)   Wt (!) 143.8 kg   SpO2 100%   BMI 44.21 kg/m  Physical Exam Vitals and nursing note reviewed. Exam conducted with a chaperone present.  Constitutional:      General: He is not in acute distress.    Appearance: Normal appearance. He is obese. He  is not ill-appearing or toxic-appearing.  HENT:     Head: Normocephalic.     Mouth/Throat:     Mouth: Mucous membranes are moist.     Pharynx: Oropharynx is clear.  Eyes:     General: No scleral icterus.    Extraocular Movements: Extraocular movements intact.     Conjunctiva/sclera: Conjunctivae normal.     Pupils: Pupils are equal, round, and reactive to light.  Cardiovascular:     Rate and Rhythm: Normal rate and regular rhythm.     Pulses: Normal pulses.     Heart sounds: No murmur heard. Pulmonary:     Effort: Pulmonary effort is normal. No respiratory distress.     Breath sounds: Normal breath sounds.  Abdominal:     General: Bowel sounds are normal. There is no distension.     Palpations: Abdomen is soft.     Tenderness: There is no abdominal tenderness.  Genitourinary:    Rectum: Normal. Guaiac result positive. No anal fissure, external hemorrhoid or internal hemorrhoid.  Musculoskeletal:        General: Normal range of motion.     Cervical back: Normal range of motion.  Skin:    General: Skin is warm and dry.     Capillary Refill: Capillary refill takes less than 2 seconds.  Neurological:     General: No focal deficit present.     Mental Status: He is alert and oriented to person, place, and time. Mental status is at baseline.  Psychiatric:        Mood and Affect: Mood normal.        Behavior: Behavior normal.        Thought Content: Thought content normal.        Judgment: Judgment normal.    ED Results / Procedures / Treatments   Labs (all labs ordered are listed, but only abnormal results are displayed) Labs Reviewed  COMPREHENSIVE METABOLIC PANEL - Abnormal; Notable for the following components:      Result Value   Glucose, Bld 127 (*)    All other components within normal limits  CBC - Abnormal; Notable for the following components:   RBC 6.29 (*)    Hemoglobin 17.8 (*)    HCT 53.5 (*)    All other components within normal limits  POC OCCULT BLOOD, ED  - Abnormal; Notable for the following components:   Fecal Occult Bld POSITIVE (*)    All other components within normal limits  CBG MONITORING, ED - Abnormal; Notable for the following components:   Glucose-Capillary 130 (*)    All other components within normal limits  PROTIME-INR  TYPE AND SCREEN  ABO/RH  TROPONIN I (HIGH SENSITIVITY)  TROPONIN I (HIGH SENSITIVITY)   EKG EKG Interpretation  Date/Time:  Thursday May 17 2022 12:44:32 EDT Ventricular Rate:  88 PR Interval:  180 QRS Duration: 94 QT Interval:  376 QTC Calculation: 683 R Axis:   73 Text Interpretation: Normal sinus rhythm Biatrial enlargement Anterior infarct , age undetermined Abnormal ECG No significant change since last tracing Confirmed by Deno Etienne [phone]) on 05/17/2022 2:30:02 PM  Radiology DG Chest 2 View  Result Date: 05/17/2022 CLINICAL DATA:  Provided history: Chest pain. EXAM: CHEST - 2 VIEW COMPARISON:  Chest radiographs 03/13/2022 and earlier. Chest CT 03/13/2022. FINDINGS: Cardiomegaly. No appreciable airspace consolidation or pulmonary edema. No evidence of pleural effusion or pneumothorax. No acute bony abnormality identified. IMPRESSION: Cardiomegaly. No radiographic evidence of acute cardiopulmonary abnormality. Please refer to the recent prior chest CT of 03/13/2022 for a description of pulmonary emboli. Electronically Signed   By: Kellie Simmering D.O.   On: 05/17/2022 13:00    Procedures Procedures   Medications Ordered in ED Medications - No data to display  ED Course/ Medical Decision Making/ A&P                           Medical Decision Making Amount and/or Complexity of Data Reviewed Labs: ordered.  Risk Decision regarding hospitalization.  This patient presents to the ED for concern of rectal bleeding, this involves an extensive number of treatment options, and is a complaint that carries with it a high risk of complications and morbidity.  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   Cardiac Monitoring: The patient was maintained on a cardiac monitor.  I personally viewed and interpreted the cardiac monitored which showed an underlying rhythm of: Sinus rhythm  Lab Results: I personally ordered, reviewed, and interpreted labs. Pertinent results include: CMP within normal limits CBC with hemoglobin of 17.8, platelets 308 PT/INR within normal limits Fecal occult positive Initial troponin 12  Imaging Studies ordered:  I ordered imaging studies which included x-ray.  I independently reviewed & interpreted imaging & am in agreement with radiology impression. Imaging shows: Chest x-ray within normal limits  Medications  -I reviewed the patient's home medications and did not make adjustments. -I did not prescribe new home medications.  Tests Considered: CT abdomen pelvis with contrast, will defer for now as he is hemodynamically stable with no abdominal tenderness  Critical Interventions: Type and screen obtained  Consultations: I requested consultation with the hospitalist, Dr. Roosevelt Locks,  and discussed lab and imaging findings as well as pertinent plan - they recommend: admission  SDH None identified   ED Course:  26 year old male who presents to the emergency department for rectal bleeding. Physical exam notable for gross blood on digital rectal exam.  There were no internal or external hemorrhoids or anal fissures.  No abdominal tenderness Labs with stable hemoglobin and platelets as well as coags. Type and screen  was obtained in case he needs transfusion EKG without tachycardia, he is not orthostatic on orthostatic vital signs  Deferred CT abdomen pelvis in the setting of his rectal bleeding because he is nontender, hemodynamically stable and not orthostatic.  He is also afebrile.  Feel that we would rather observe him at this time rather than scan him.  Anticoagulated  on Eliquis.  This is likely the cause of his rectal bleeding.  Likely a lower GI source.  Do not think it is from a hemorrhoid or fissure as did not see or palpate this on exam.  Would be unlikely to be upper GI bleed as he is hemodynamically stable and this would require brisk bleeding to continue to be bright red.  Unclear exact etiology of his lower GI bleed, however feel that he is more appropriate for admission for observation given his significant medical history.  He is in agreement with this plan.  Will not stop his Eliquis at this time due to his recent PE with cor pulmonale as well as LV apical clot.  He additionally stated that he had chest pain earlier this week, we obtained a troponin which has been negative, EKG without ischemia or infarction and his chest x-ray is within normal limits.  I doubt ACS at this time but will continue to monitor closely.  Talked with Dr. Roosevelt Locks, hospitalist who agrees to admit patient for observation at this time  After consideration of the diagnostic results and the patients response to treatment, I feel that the patent would benefit from admission. The patient has been appropriately medically screened and/or stabilized in the ED.  Final Clinical Impression(s) / ED Diagnoses Final diagnoses:  Rectal bleeding    Rx / DC Orders ED Discharge Orders     None         Mickie Hillier, PA-C 05/17/22 [place], DO 05/17/22 1536

## 2022-05-17 NOTE — ED Triage Notes (Signed)
Patient complains rectal bleeding and dizziness for the last few days, patient is on eliquis. Reports bright red blood in the toilet bowl and on tissue. Patient is alert, oriented, and in no apparent distress at this time.

## 2022-05-17 NOTE — H&P (Signed)
History and Physical    Cleland Simkins OVF:[id number] DOB: [birth year] DOA: 05/17/2022  PCP: Ellene Route (Confirm with patient/family/NH records and if not entered, this has to be entered at [facility] point of entry) Patient coming from: Home  I have personally briefly reviewed patient's old medical records in [place]  Chief Complaint: rectal bleed  HPI: Taiwo Fish is a 26 y.o. 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  Symptoms started yesterday, patient noticed bright red blood when wiping, denies any blood including stool dark-colored stool no abdominal pain or rectal pain.  Denies any lightheadedness shortness of breath or chest pains.  This morning, rectal bleeding happened again, same as air severity, bright red blood when wiping x1.  Again no abdominal pain or chest pain or shortness of breath or lightheadedness when standing. No Hx of EGD or colonoscopy.  Patient also reported that he recently has on and off diarrhea, which he irritability to metformin, and his cardiology cut down metformin from 1000 twice daily to 500 twice daily.  ED Course: Blood pressure borderline low, no tachycardia.  Hemoglobin 17.8 compared to 18 baseline.  BUN 13 creatinine 1.2, glucose 127.  ED did rectal exam, reportedly there was no hemorrhoids.  Review of Systems: As per HPI otherwise 14 point review of systems negative.    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 05/2021 noted during periods of sleep @ time of hospitalization for PE.   Cardiomyopathy ([place])    a. 05/2021 Echo: EF <20%, glob HK. Mathews:C ratio of 2.1:1 suggesting non-compaction. Gr2 DD. Sev reduced RV fxn. Nl PASP. Mildly dil RA. Mild MR; b. 10/2021 Echo: EF <20%, GrII DD; c. 10/2021 TEE: EF 20-25%, glob HK, sev red RV  fxn.   Diabetes mellitus without complication ([place])    HFrEF (heart failure with reduced ejection fraction) ([place])    a. 05/2021 Echo: EF <20%. ? non-compaction.   Hypertension    Pulmonary embolism ([place])    a. 05/2021 CTA Chest: PE extending from lobar arteris of RUL and RLL w/ concern for pulml infarct-->s/p thrombolysis and thrombectomy of RUL/RML/RLL.   Stroke [facility])    a. 10/2021 CT Head: Bilat cerebral infarcts w/ mod-sided acute to subacute R temporoparietal infarct; b. 10/2021 MRI: R post PCA territory infarcts w/o hemorrhage; c. 10/2021 TEE: Neg bubble study. No LA/LAA thrombus. EF 20-25%, glob HK.    Past Surgical History:  Procedure Laterality Date   HAND SURGERY Left age 87   HAND SURGERY     PULMONARY THROMBECTOMY N/A 06/28/2021   Procedure: PULMONARY THROMBECTOMY;  Surgeon: Algernon Huxley, MD;  Location: [facility];  Service: Cardiovascular;  Laterality: N/A;   TEE WITHOUT CARDIOVERSION N/A 11/08/2021   Procedure: TRANSESOPHAGEAL ECHOCARDIOGRAM (TEE);  Surgeon: Kate Sable, MD;  Location: [facility] ORS;  Service: Cardiovascular;  Laterality: N/A;     reports that he has never smoked. He has never used smokeless tobacco. He reports that he does not currently use alcohol. He reports that he does not currently use drugs after having used the following drugs: Marijuana.  No Known Allergies  Family History  Problem Relation Age of Onset   Heart failure Mother    Cerebrovascular Accident Mother    Diabetes Father    Hypertension Father  Prior to Admission medications   Medication Sig Start Date End Date Taking? Authorizing Provider  apixaban (ELIQUIS) 5 MG TABS tablet Take 1 tablet (5 mg total) by mouth 2 (two) times daily. 04/25/22   Bensimhon, Shaune Pascal, MD  Bempedoic Acid-Ezetimibe (NEXLIZET) 180-10 MG TABS Take 1 tablet by mouth daily. 05/01/22   Bensimhon, Shaune Pascal, MD  Blood Glucose Monitoring Suppl (BLOOD GLUCOSE MONITOR SYSTEM) w/Device KIT Use up to four  times daily as directed. 03/21/22   Arrien, Jimmy Picket, MD  Blood Glucose Monitoring Suppl (CONTOUR NEXT ONE) DEVI Use up to four times daily as directed 03/21/22   Arrien, Jimmy Picket, MD  carvedilol (COREG) 3.125 MG tablet Take 3.125 mg by mouth 2 (two) times daily. 04/06/22   [provider]  digoxin (LANOXIN) 0.125 MG tablet Take 1 tablet (0.125 mg total) by mouth daily. 04/25/22   Bensimhon, Shaune Pascal, MD  FARXIGA 10 MG TABS tablet Take 10 mg by mouth daily. 04/21/22   [provider]  furosemide (LASIX) 40 MG tablet Take 2 tablets (80 mg total) by mouth daily. 04/25/22   Bensimhon, Shaune Pascal, MD  glucose blood (CONTOUR NEXT TEST) test strip Use as directed up to 4 times daily. 03/21/22   Arrien, Jimmy Picket, MD  metFORMIN (GLUCOPHAGE) 500 MG tablet Take 1 tablet (500 mg total) by mouth 2 (two) times daily with a meal. 03/22/22 04/26/23  Arrien, Jimmy Picket, MD  potassium chloride (KLOR-CON) 20 MEQ packet Take 40 mEq by mouth daily. Take on days you take Lasix. 04/25/22   Bensimhon, Shaune Pascal, MD  rosuvastatin (CRESTOR) 40 MG tablet Take 1 tablet (40 mg total) by mouth daily. 03/29/22   Bensimhon, Shaune Pascal, MD  sacubitril-valsartan (ENTRESTO) 97-103 MG Take 1 tablet by mouth 2 (two) times daily. 04/25/22   Bensimhon, Shaune Pascal, MD  Semaglutide,0.25 or 0.5MG/DOS, (OZEMPIC, 0.25 OR 0.5 MG/DOSE,) 2 MG/3ML SOPN Inject 0.5 mg into the skin once a week. 04/30/22   Bensimhon, Shaune Pascal, MD  spironolactone (ALDACTONE) 25 MG tablet Take 1 tablet (25 mg total) by mouth daily. 04/25/22   Bensimhon, Shaune Pascal, MD    Physical Exam: Vitals:   05/17/22 1228 05/17/22 1304 05/17/22 1345  BP: 112/77  104/70  Pulse: (!) 57    Resp: 16  18  Temp: 98.7 F (37.1 C)    TempSrc: Oral    SpO2: 100%    Weight:  (!) 143.8 kg   Height:  5' 11"  (1.803 m)     Constitutional: NAD, calm, comfortable Vitals:   05/17/22 1228 05/17/22 1304 05/17/22 1345  BP: 112/77  104/70  Pulse: (!) 57    Resp: 16   18  Temp: 98.7 F (37.1 C)    TempSrc: Oral    SpO2: 100%    Weight:  (!) 143.8 kg   Height:  5' 11"  (1.803 m)    Eyes: PERRL, lids and conjunctivae normal ENMT: Mucous membranes are moist. Posterior pharynx clear of any exudate or lesions.Normal dentition.  Neck: normal, supple, no masses, no thyromegaly Respiratory: clear to auscultation bilaterally, no wheezing, no crackles. Normal respiratory effort. No accessory muscle use.  Cardiovascular: Regular rate and rhythm, no murmurs / rubs / gallops. No extremity edema. 2+ pedal pulses. No carotid bruits.  Abdomen: no tenderness, no masses palpated. No hepatosplenomegaly. Bowel sounds positive.  Musculoskeletal: no clubbing / cyanosis. No joint deformity upper and lower extremities. Good ROM, no contractures. Normal muscle tone.  Skin: no rashes, lesions, ulcers. No  induration Neurologic: CN 2-12 grossly intact. Sensation intact, DTR normal. Strength 5/5 in all 4.  Psychiatric: Normal judgment and insight. Alert and oriented x 3. Normal mood.     Labs on Admission: I have personally reviewed following labs and imaging studies  CBC: Recent Labs  Lab 05/17/22 1239  WBC 6.3  HGB 17.8*  HCT 53.5*  MCV 85.1  PLT 003   Basic Metabolic Panel: Recent Labs  Lab 05/17/22 1239  NA 136  K 4.1  CL 100  CO2 25  GLUCOSE 127*  BUN 13  CREATININE 1.20  CALCIUM 9.6   GFR: Estimated Creatinine Clearance: 134.3 mL/min (by C-G formula based on SCr of 1.2 mg/dL). Liver Function Tests: Recent Labs  Lab 05/17/22 1239  AST 31  ALT 23  ALKPHOS 72  BILITOT 0.9  PROT 7.5  ALBUMIN 3.7   No results for input(s): LIPASE, AMYLASE in the last 168 hours. No results for input(s): AMMONIA in the last 168 hours. Coagulation Profile: Recent Labs  Lab 05/17/22 1323  INR 1.2   Cardiac Enzymes: No results for input(s): CKTOTAL, CKMB, CKMBINDEX, TROPONINI in the last 168 hours. BNP (last 3 results) No results for input(s): PROBNP in the  last 8760 hours. HbA1C: No results for input(s): HGBA1C in the last 72 hours. CBG: Recent Labs  Lab 05/17/22 1311  GLUCAP 130*   Lipid Profile: No results for input(s): CHOL, HDL, LDLCALC, TRIG, CHOLHDL, LDLDIRECT in the last 72 hours. Thyroid Function Tests: No results for input(s): TSH, T4TOTAL, FREET4, T3FREE, THYROIDAB in the last 72 hours. Anemia Panel: No results for input(s): VITAMINB12, FOLATE, FERRITIN, TIBC, IRON, RETICCTPCT in the last 72 hours. Urine analysis:    Component Value Date/Time   COLORURINE YELLOW 03/20/2022 [place] 03/20/2022 0755   LABSPEC 1.020 03/20/2022 0755   PHURINE 7.0 03/20/2022 0755   GLUCOSEU >=500 (A) 03/20/2022 0755   HGBUR SMALL (A) 03/20/2022 0755   BILIRUBINUR NEGATIVE 03/20/2022 0755   KETONESUR NEGATIVE 03/20/2022 0755   PROTEINUR NEGATIVE 03/20/2022 0755   NITRITE NEGATIVE 03/20/2022 0755   LEUKOCYTESUR NEGATIVE 03/20/2022 0755    Radiological Exams on Admission: DG Chest 2 View  Result Date: 05/17/2022 CLINICAL DATA:  Provided history: Chest pain. EXAM: CHEST - 2 VIEW COMPARISON:  Chest radiographs 03/13/2022 and earlier. Chest CT 03/13/2022. FINDINGS: Cardiomegaly. No appreciable airspace consolidation or pulmonary edema. No evidence of pleural effusion or pneumothorax. No acute bony abnormality identified. IMPRESSION: Cardiomegaly. No radiographic evidence of acute cardiopulmonary abnormality. Please refer to the recent prior chest CT of 03/13/2022 for a description of pulmonary emboli. Electronically Signed   By: Kellie Simmering D.O.   On: 05/17/2022 13:00    EKG: Independently reviewed. Sinus, no PR or QTc intervals changes.  Assessment/Plan Principal Problem:   Lower GI bleed Active Problems:   LV (left ventricular) mural thrombus   Essential hypertension   PE (pulmonary thromboembolism) ([place])   Diabetes mellitus type 2, noninsulin dependent ([place])  (please populate well all problems here in Problem List. (For  example, if patient is on BP meds at home and you resume or decide to hold them, it is a problem that needs to be her. Same for CAD, COPD, HLD and so on)  Rectal bleeding -H&H stable, according to patient's on observation, no blood coating stool or blood clots indicating bleeding site likely low. Suspect internal hemorrhoid, will order hemorrhoid cream. -Recheck H&H tonight and tomorrow morning -No occasion for PRBC at this time. -[facility] consulted  Hx of PE -History of significant thrombosis burden with acute cor pulmonale and emergency thrombectomy indicating high risk PE, given there is no significant hemodynamic compromise, plan to remain patient on systemic anticoagulation, switch to heparin drip for easy control if bleeding becomes severe.  Explained to patient who expressed understanding agreed.  LV mural thrombosis -With Hx of stroke again decreasing high risk of thromboembolic risk, heparin drip as explained above.  Borderline hypotension -Asymptomatic, doubt this is related to GI bleed.  Hold off Coreg, hold off Entresto and spironolactone for now.  As needed hydralazine for SBP more than 150.  HTN -Above  Chronic combined systolic and diastolic CHF -Euvolemic -Resume Lasix tomorrow -Continue digoxin, hold off other CHF/BP meds as mentioned above.  Obesity -Recommend calorie control  IIDM -Patient complained about metformin causing diarrhea, change metformin to Januvia -Continue Ozempic and Tradjenta  DVT prophylaxis: Heparin drip Family Communication: Wife at bedside Disposition Plan: Expect less than 2 midnight hospital stay Consults called: [facility] Admission status: Tele obs   Lequita Halt MD [facility] Pager [phone]  05/17/2022, 3:28 PM

## 2022-05-17 NOTE — Progress Notes (Addendum)
ANTICOAGULATION CONSULT NOTE - Initial Consult  Pharmacy Consult for Heparin Indication: pulmonary embolus  No Known Allergies  Patient Measurements: Height: 5\' 11"  (180.3 cm) Weight: (!) 143.8 kg (317 lb) IBW/kg (Calculated) : 75.3 Heparin Dosing Weight: 109 kg  Vital Signs: Temp: 98.7 F (37.1 C) (05/18 1228) Temp Source: Oral (05/18 1228) BP: 104/70 (05/18 1345) Pulse Rate: 57 (05/18 1228)  Labs: Recent Labs    05/17/22 1239 05/17/22 1323  HGB 17.8*  --   HCT 53.5*  --   PLT 305  --   LABPROT  --  15.0  INR  --  1.2  CREATININE 1.20  --   TROPONINIHS 12  --     Estimated Creatinine Clearance: 134.3 mL/min (by C-G formula based on SCr of 1.2 mg/dL).   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 05/2021 noted during periods of sleep @ time of hospitalization for PE.   Cardiomyopathy (HCC)    a. 05/2021 Echo: EF <20%, glob HK. [place]:C ratio of 2.1:1 suggesting non-compaction. Gr2 DD. Sev reduced RV fxn. Nl PASP. Mildly dil RA. Mild MR; b. 10/2021 Echo: EF <20%, GrII DD; c. 10/2021 TEE: EF 20-25%, glob HK, sev red RV fxn.   Diabetes mellitus without complication (HCC)    HFrEF (heart failure with reduced ejection fraction) (HCC)    a. 05/2021 Echo: EF <20%. ? non-compaction.   Hypertension    Pulmonary embolism (HCC)    a. 05/2021 CTA Chest: PE extending from lobar arteris of RUL and RLL w/ concern for pulml infarct-->s/p thrombolysis and thrombectomy of RUL/RML/RLL.   Stroke [facility])    a. 10/2021 CT Head: Bilat cerebral infarcts w/ mod-sided acute to subacute R temporoparietal infarct; b. 10/2021 MRI: R post PCA territory infarcts w/o hemorrhage; c. 10/2021 TEE: Neg bubble study. No LA/LAA thrombus. EF 20-25%, glob HK.    Medications:  (Not in a hospital admission)  Scheduled:   Bempedoic Acid-Ezetimibe  1 tablet Oral Daily   dapagliflozin propanediol  10 mg Oral Daily   digoxin  0.125 mg Oral Daily   [START ON 05/18/2022]  furosemide  80 mg Oral Daily   hydrocortisone  25 mg Rectal BID   insulin aspart  0-15 Units Subcutaneous TID WC   linagliptin  5 mg Oral Daily   potassium chloride  40 mEq Oral Daily   rosuvastatin  40 mg Oral Daily   Infusions:   heparin     PRN: hydrALAZINE  Assessment: 25 yom with a history of HF. Cardiomyopathy, PE s/p thrombectomy, LV mural thrombosis, DM, HTN. Patient is presenting with rectal bleeding. Heparin per pharmacy consult placed for pulmonary embolus.  Patient is on apixaban prior to arrival. Last dose 5/18am. Will require aPTT monitoring due to likely falsely high anti-Xa level secondary to DOAC use.  Hgb 17.8; plt 305 PT/INR 15 / 1.2  Goal of Therapy:  Heparin level 0.3-0.7 units/ml aPTT 66-102 seconds Monitor platelets by anticoagulation protocol: Yes   Plan:  No initial heparin bolus Start heparin infusion at 1750 units/hr at hold in AM on 5/19 per GI request Check aPTT & anti-Xa level at 0000 and daily while on heparin Continue to monitor via aPTT until levels are correlated Continue to monitor H&H and platelets  6/19, PharmD, BCPS 05/17/2022 3:47 PM ED Clinical Pharmacist -  [phone]

## 2022-05-17 NOTE — ED Notes (Signed)
Per Dr Chipper Herb:  hold heparin drip at [facility] for the GI procedure

## 2022-05-17 NOTE — ED Notes (Signed)
Patient reports that he will not like another IV at this time.  Patient has been given education on why heparin needs a dedicated line and the risks vs benefits of obtaining a second line.

## 2022-05-17 NOTE — ED Provider Triage Note (Signed)
Emergency Medicine Provider Triage Evaluation Note  Robert Mccann , a 26 y.o. male  was evaluated in triage.  Pt complains of 2 episodes of rectal bleeding since last night.  Reports bright red blood and blood when he wipes the area as well.  He has been compliant with all of his medications including his Eliquis.  Reports that he is having generalized abdominal pain as well as intermittent chest pain yesterday and the day before yesterday.  He denies any shortness of breath.  Reports he feels like he is dehydrated.  Admits that he does not check his blood sugars.  Review of Systems  Positive: Rectal bleeding, abdominal pain, chest pain Negative: Shortness of breath  Physical Exam  BP 112/77 (BP Location: Right Arm)   Pulse (!) 57   Temp 98.7 F (37.1 C) (Oral)   Resp 16   SpO2 100%  Gen:   Awake, no distress   Resp:  Normal effort  MSK:   Moves extremities without difficulty  Other:  Abdomen generally tender without rebound or guarding  Medical Decision Making  Medically screening exam initiated at 12:39 PM.  Appropriate orders placed.  [name] was informed that the remainder of the evaluation will be completed by another provider, this initial triage assessment does not replace that evaluation, and the importance of remaining in the ED until their evaluation is complete.  Labs and EKG ordered   Dietrich Pates, [place] 05/17/22 1241

## 2022-05-18 ENCOUNTER — Encounter (HOSPITAL_COMMUNITY)
Admission: EM | Disposition: A | Discharge status: Home / Self Care | Payer: Self-pay | Source: Home / Self Care | Attending: Emergency Medicine

## 2022-05-18 ENCOUNTER — Encounter (HOSPITAL_COMMUNITY): Payer: Self-pay | Admitting: Internal Medicine

## 2022-05-18 ENCOUNTER — Observation Stay (HOSPITAL_BASED_OUTPATIENT_CLINIC_OR_DEPARTMENT_OTHER): Payer: 59 | Admitting: Anesthesiology

## 2022-05-18 ENCOUNTER — Observation Stay (HOSPITAL_COMMUNITY): Payer: 59 | Admitting: Anesthesiology

## 2022-05-18 ENCOUNTER — Other Ambulatory Visit: Payer: Self-pay

## 2022-05-18 DIAGNOSIS — K5730 Diverticulosis of large intestine without perforation or abscess without bleeding: Secondary | ICD-10-CM

## 2022-05-18 DIAGNOSIS — I513 Intracardiac thrombosis, not elsewhere classified: Secondary | ICD-10-CM

## 2022-05-18 DIAGNOSIS — K635 Polyp of colon: Secondary | ICD-10-CM

## 2022-05-18 DIAGNOSIS — K648 Other hemorrhoids: Secondary | ICD-10-CM

## 2022-05-18 DIAGNOSIS — D751 Secondary polycythemia: Secondary | ICD-10-CM

## 2022-05-18 DIAGNOSIS — K922 Gastrointestinal hemorrhage, unspecified: Secondary | ICD-10-CM | POA: Diagnosis not present

## 2022-05-18 DIAGNOSIS — K602 Anal fissure, unspecified: Secondary | ICD-10-CM | POA: Diagnosis not present

## 2022-05-18 DIAGNOSIS — I2699 Other pulmonary embolism without acute cor pulmonale: Secondary | ICD-10-CM | POA: Diagnosis not present

## 2022-05-18 DIAGNOSIS — K625 Hemorrhage of anus and rectum: Secondary | ICD-10-CM

## 2022-05-18 HISTORY — PX: COLONOSCOPY: SHX5424

## 2022-05-18 HISTORY — PX: POLYPECTOMY: SHX5525

## 2022-05-18 HISTORY — PX: HEMOSTASIS CLIP PLACEMENT: SHX6857

## 2022-05-18 LAB — CBC
HCT: 57.9 % — ABNORMAL HIGH (ref 39.0–52.0)
Hemoglobin: 19.6 g/dL — ABNORMAL HIGH (ref 13.0–17.0)
MCH: 28.7 pg (ref 26.0–34.0)
MCHC: 33.9 g/dL (ref 30.0–36.0)
MCV: 84.9 fL (ref 80.0–100.0)
Platelets: 258 10*3/uL (ref 150–400)
RBC: 6.82 MIL/uL — ABNORMAL HIGH (ref 4.22–5.81)
RDW: 15.8 % — ABNORMAL HIGH (ref 11.5–15.5)
WBC: 6.1 10*3/uL (ref 4.0–10.5)
nRBC: 0 % (ref 0.0–0.2)

## 2022-05-18 LAB — HEPARIN LEVEL (UNFRACTIONATED)
Heparin Unfractionated: >1.1 IU/mL — ABNORMAL HIGH (ref 0.30–0.70)
Heparin Unfractionated: 0.87 IU/mL — ABNORMAL HIGH (ref 0.30–0.70)

## 2022-05-18 LAB — GLUCOSE, CAPILLARY
Glucose-Capillary: 147 mg/dL — ABNORMAL HIGH (ref 70–99)
Glucose-Capillary: 129 mg/dL — ABNORMAL HIGH (ref 70–99)

## 2022-05-18 LAB — APTT: aPTT: 110 seconds — ABNORMAL HIGH (ref 24–36)

## 2022-05-18 SURGERY — COLONOSCOPY
Anesthesia: Monitor Anesthesia Care

## 2022-05-18 MED ORDER — HYDROCORTISONE ACETATE 25 MG RE SUPP: 25.0000 mg | Freq: Two times a day (BID) | RECTAL | Status: DC

## 2022-05-18 MED ORDER — PHENYLEPHRINE 80 MCG/ML (10ML) SYRINGE FOR IV PUSH (FOR BLOOD PRESSURE SUPPORT)
PREFILLED_SYRINGE | INTRAVENOUS | Status: DC | PRN
  Administered 2022-05-18 (×4): 80 ug via INTRAVENOUS
  Administered 2022-05-18: 160 ug via INTRAVENOUS
  Administered 2022-05-18: 80 ug via INTRAVENOUS

## 2022-05-18 MED ORDER — LACTATED RINGERS IV SOLN: INTRAVENOUS | Status: DC | PRN

## 2022-05-18 MED ORDER — LIDOCAINE 2% (20 MG/ML) 5 ML SYRINGE
INTRAMUSCULAR | Status: DC | PRN
  Administered 2022-05-18: 40 mg via INTRAVENOUS

## 2022-05-18 MED ORDER — HYDROCORTISONE ACETATE 25 MG RE SUPP: 25.0000 mg | Freq: Two times a day (BID) | RECTAL | 0 refills | Status: AC

## 2022-05-18 MED ORDER — APIXABAN 5 MG PO TABS
5.0000 mg | ORAL_TABLET | Freq: Two times a day (BID) | ORAL | Status: DC
  Administered 2022-05-18: 5 mg via ORAL
  Filled 2022-05-18: qty 1

## 2022-05-18 MED ORDER — PROPOFOL 500 MG/50ML IV EMUL
INTRAVENOUS | Status: DC | PRN
  Administered 2022-05-18: 75 ug/kg/min via INTRAVENOUS

## 2022-05-18 MED ORDER — AMBULATORY NON FORMULARY MEDICATION: 1 refills | Status: DC

## 2022-05-18 MED ORDER — KETAMINE HCL 50 MG/5ML IJ SOSY
PREFILLED_SYRINGE | INTRAMUSCULAR | Status: AC
  Filled 2022-05-18: qty 5

## 2022-05-18 MED ORDER — PROPOFOL 10 MG/ML IV BOLUS
INTRAVENOUS | Status: DC | PRN
  Administered 2022-05-18: 10 mg via INTRAVENOUS
  Administered 2022-05-18: 15 mg via INTRAVENOUS
  Administered 2022-05-18: 30 mg via INTRAVENOUS

## 2022-05-18 SURGICAL SUPPLY — 22 items

## 2022-05-18 NOTE — Interval H&P Note (Signed)
History and Physical Interval Note:  05/18/2022 9:04 AM  Robert Mccann  has presented today for surgery, with the diagnosis of Rectal Bleeding.  The various methods of treatment have been discussed with the patient and family. After consideration of risks, benefits and other options for treatment, the patient has consented to  Procedure(s): COLONOSCOPY WITH PROPOFOL (N/A) as a surgical intervention.  The patient's history has been reviewed, patient examined, no change in status, stable for surgery.  I have reviewed the patient's chart and labs.  Questions were answered to the patient's satisfaction.     [name]

## 2022-05-18 NOTE — Transfer of Care (Signed)
Immediate Anesthesia Transfer of Care Note  Patient: Buck Mam  Procedure(s) Performed: COLONOSCOPY POLYPECTOMY HEMOSTASIS CLIP PLACEMENT  Patient Location: PACU and Endoscopy Unit  Anesthesia Type:MAC  Level of Consciousness: drowsy and responds to stimulation  Airway & Oxygen Therapy: Patient Spontanous Breathing  Post-op Assessment: Report given to RN and Post -op Vital signs reviewed and stable  Post vital signs: Reviewed and stable  Last Vitals:  Vitals Value Taken Time  BP    Temp    Pulse 88 05/18/22 0940  Resp 15 05/18/22 0940  SpO2 98 % 05/18/22 0940  Vitals shown include unvalidated device data.  Last Pain:  Vitals:   05/18/22 0822  TempSrc: Temporal  PainSc: 0-No pain         Complications: No notable events documented.

## 2022-05-18 NOTE — Discharge Summary (Addendum)
Physician Discharge Summary  Robert Mccann TGG:[id number] DOB: 06-May-1996 DOA: 05/17/2022  PCP: Ellene Route  Admit date: 05/17/2022 Discharge date: 05/18/2022  Admitted From: Home Disposition:  Home  Recommendations for Outpatient Follow-up:  Follow up with PCP in 1-2 weeks Please obtain BMP/CBC in one week Referral has been made to hematology/oncology for polycythemia especially with this patient with history of recent LV/PE    Discharge Condition:Stable CODE STATUS:FULL Diet recommendation: Heart Healthy / High Fiber  Brief/Interim Summary:  Robert Mccann is a 26 y.o. 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  Rectal bleeding -Globin has been stable, no recurrence episodes, GI were consulted, had colonoscopy done this morning, significant for active bleed from anal fissure, as well he did have some hemorrhoids, diverticulosis, and 1 transverse colon polyp s/p resection, status post clip placement, have discussed with GI, okay to discharge today, on Anusol suppositories x7 days, GI already sent prescription for Cardizem/lidocaine ointment.   Hx of PE -On heparin drip overnight, resumed on Eliquis before discharge  LV mural thrombosis -Heparin drip overnight, resumed on Eliquis before discharge   Polycythemia -Patient hemoglobin significantly elevated at 19.6, it was noted he received some IV iron during recent hospital admission, platelet count, white blood cell counts within normal limit, I have sent ambulatory referral to hematology to see if further evaluation is warranted especially with history of recent PE.  Chronic combined systolic and diastolic CHF -Resume cardiac meds.   Obesity -Recommend calorie control   IIDM -resume  home medications  Discharge Diagnoses:   Principal Problem:   Lower GI bleed Active Problems:   LV (left ventricular) mural thrombus   Essential hypertension   PE (pulmonary thromboembolism) (HCC)   Diabetes mellitus type 2, noninsulin dependent [facility])    Discharge Instructions  Discharge Instructions     Ambulatory referral to Hematology / Oncology   Complete by: As directed    Referral been made for polycythemia, and history of PE/LV thrombus.   Diet - low sodium heart healthy   Complete by: As directed    Discharge instructions   Complete by: As directed    Follow with Primary MD [facility] in 7 days   Get CBC, CMP,  checked  by Primary MD next visit.    Activity: As tolerated with Full fall precautions use walker/cane & assistance as needed   Disposition Home    Diet: Heart Healthy /High Fiber Diet   On your next visit with your primary care physician please Get Medicines reviewed and adjusted.   Please request your Prim.MD to go over all Hospital Tests and Procedure/Radiological results at the follow up, please get all Hospital records sent to your Prim MD by signing hospital release before you go home.   If you experience worsening of your admission symptoms, develop shortness of breath, life threatening emergency, suicidal or homicidal thoughts you must seek medical attention immediately by calling 911 or calling your MD immediately  if symptoms less severe.  You Must read complete instructions/literature along with all the possible adverse reactions/side effects for all the Medicines you take and that have been prescribed to you. Take any new Medicines after you have completely understood and accpet all the possible adverse reactions/side effects.   Do not drive, operating heavy machinery, perform activities at heights, swimming or participation in water activities or provide baby sitting  services if your were admitted for syncope or siezures until you have seen by Primary MD or a Neurologist  and advised to do so again.  Do not drive when taking Pain medications.    Do not take more than prescribed Pain, Sleep and Anxiety Medications  Special Instructions: If you have smoked or chewed Tobacco  in the last 2 yrs please stop smoking, stop any regular Alcohol  and or any Recreational drug use.  Wear Seat belts while driving.   Please note  You were cared for by a hospitalist during your hospital stay. If you have any questions about your discharge medications or the care you received while you were in the hospital after you are discharged, you can call the unit and asked to speak with the hospitalist on call if the hospitalist that took care of you is not available. Once you are discharged, your primary care physician will handle any further medical issues. Please note that NO REFILLS for any discharge medications will be authorized once you are discharged, as it is imperative that you return to your primary care physician (or establish a relationship with a primary care physician if you do not have one) for your aftercare needs so that they can reassess your need for medications and monitor your lab values.   Increase activity slowly   Complete by: As directed       Allergies as of 05/18/2022   No Known Allergies      Medication List     TAKE these medications    AMBULATORY NON FORMULARY MEDICATION Diltiazem 2% compounded with lidocaine 5% ointment applied to the affected area 3 times daily x 8 weeks   apixaban 5 MG Tabs tablet Commonly known as: ELIQUIS Take 1 tablet (5 mg total) by mouth 2 (two) times daily.   BIOTIN PO Take 1 capsule by mouth daily.   Blood Glucose Monitor System w/Device Kit Use up to four times daily as directed.   carvedilol 3.125 MG tablet Commonly known as: COREG Take 3.125 mg by mouth 2 (two) times daily.   digoxin 0.125 MG tablet Commonly known as: LANOXIN Take 1 tablet (0.125 mg total) by mouth daily.   Entresto 97-103  MG Generic drug: sacubitril-valsartan Take 1 tablet by mouth 2 (two) times daily.   Farxiga 10 MG Tabs tablet Generic drug: dapagliflozin propanediol Take 10 mg by mouth daily.   furosemide 40 MG tablet Commonly known as: LASIX Take 2 tablets (80 mg total) by mouth daily.   metFORMIN 500 MG tablet Commonly known as: GLUCOPHAGE Take 1 tablet (500 mg total) by mouth 2 (two) times daily with a meal.   multivitamin with minerals Tabs tablet Take 1 tablet by mouth daily.   Nexlizet 180-10 MG Tabs Generic drug: Bempedoic Acid-Ezetimibe Take 1 tablet by mouth daily.   Ozempic (0.25 or 0.5 MG/DOSE) 2 MG/3ML Sopn Generic drug: Semaglutide(0.25 or 0.5MG/DOS) Inject 0.5 mg into the skin once a week. What changed: when to take this   potassium chloride 20 MEQ packet Commonly known as: KLOR-CON Take 40 mEq by mouth daily. Take on days you take Lasix.   rosuvastatin 40 MG tablet Commonly known as: CRESTOR Take 1 tablet (40 mg total) by mouth daily.   spironolactone 25 MG tablet Commonly known as: ALDACTONE Take 1 tablet (25 mg total) by mouth daily.        No Known Allergies  Consultations: GI   Procedures/Studies: DG Chest 2 View  Result Date: 05/17/2022 CLINICAL  DATA:  Provided history: Chest pain. EXAM: CHEST - 2 VIEW COMPARISON:  Chest radiographs 03/13/2022 and earlier. Chest CT 03/13/2022. FINDINGS: Cardiomegaly. No appreciable airspace consolidation or pulmonary edema. No evidence of pleural effusion or pneumothorax. No acute bony abnormality identified. IMPRESSION: Cardiomegaly. No radiographic evidence of acute cardiopulmonary abnormality. Please refer to the recent prior chest CT of 03/13/2022 for a description of pulmonary emboli. Electronically Signed   By: Kellie Simmering D.O.   On: 05/17/2022 13:00   ECHOCARDIOGRAM COMPLETE  Result Date: 04/25/2022    ECHOCARDIOGRAM REPORT   Patient Name:   ONOFRE GAINS Date of Exam: 04/25/2022 Medical Rec #:  [id number]         Height:       71.0 in Accession #:    [id number]       Weight:       321.2 lb Date of Birth:  1996-02-13       BSA:          2.579 m Patient Age:    25 years         BP:           136/98 mmHg Patient Gender: M                HR:           91 bpm. Exam Location:  Outpatient Procedure: 2D Echo, Cardiac Doppler, Color Doppler and Intracardiac            Opacification Agent Indications:    Congestive Heart Failure I50.9  History:        Patient has prior history of Echocardiogram examinations, most                 recent 03/14/2022. Risk Factors:Hypertension and Diabetes.  Sonographer:    Mikki Santee RDCS Referring Phys: [place]  1. Prominent LV apical clot 2.3 x 1.8 cm. Left ventricular ejection fraction, by estimation, is <20%. The left ventricle has severely decreased function. The left ventricle demonstrates global hypokinesis. The left ventricular internal cavity size was moderately to severely dilated. Left ventricular diastolic parameters are consistent with Grade III diastolic dysfunction (restrictive).  2. Right ventricular systolic function is moderately reduced. The right ventricular size is moderately enlarged. There is normal pulmonary artery systolic pressure. The estimated right ventricular systolic pressure is 70.7 mmHg.  3. Left atrial size was moderately dilated.  4. Right atrial size was moderately dilated.  5. The pericardial effusion is posterior to the left ventricle.  6. The mitral valve is normal in structure. Trivial mitral valve regurgitation. No evidence of mitral stenosis.  7. The aortic valve is normal in structure. Aortic valve regurgitation is not visualized. No aortic stenosis is present.  8. The inferior vena cava is normal in size with greater than 50% respiratory variability, suggesting right atrial pressure of 3 mmHg. FINDINGS  Left Ventricle: Prominent LV apical clot 2.3 x 1.8 cm. Left ventricular ejection fraction, by estimation, is <20%. The left  ventricle has severely decreased function. The left ventricle demonstrates global hypokinesis. Definity contrast agent was given IV to delineate the left ventricular endocardial borders. The left ventricular internal cavity size was moderately to severely dilated. There is no left ventricular hypertrophy. Left ventricular diastolic parameters are consistent with Grade III diastolic dysfunction (restrictive). Right Ventricle: The right ventricular size is moderately enlarged. No increase in right ventricular wall thickness. Right ventricular systolic function is moderately reduced. There is normal pulmonary artery systolic pressure. The  tricuspid regurgitant velocity is 2.40 m/s, and with an assumed right atrial pressure of 3 mmHg, the estimated right ventricular systolic pressure is 16.1 mmHg. Left Atrium: Left atrial size was moderately dilated. Right Atrium: Right atrial size was moderately dilated. Pericardium: Trivial pericardial effusion is present. The pericardial effusion is posterior to the left ventricle. Mitral Valve: The mitral valve is normal in structure. Trivial mitral valve regurgitation. No evidence of mitral valve stenosis. Tricuspid Valve: The tricuspid valve is normal in structure. Tricuspid valve regurgitation is mild . No evidence of tricuspid stenosis. Aortic Valve: The aortic valve is normal in structure. Aortic valve regurgitation is not visualized. No aortic stenosis is present. Pulmonic Valve: The pulmonic valve was normal in structure. Pulmonic valve regurgitation is trivial. No evidence of pulmonic stenosis. Aorta: The aortic root is normal in size and structure. Venous: The inferior vena cava is normal in size with greater than 50% respiratory variability, suggesting right atrial pressure of 3 mmHg. IAS/Shunts: No atrial level shunt detected by color flow Doppler.  LEFT VENTRICLE PLAX 2D LVIDd:         6.70 cm      Diastology LVIDs:         6.10 cm      LV e' medial:   3.00 cm/s LV PW:          1.30 cm      LV E/e' medial: 23.0 LV IVS:        0.90 cm LVOT diam:     2.20 cm LV SV:         24 LV SV Index:   9 LVOT Area:     3.80 cm  LV Volumes (MOD) LV vol d, MOD A4C: 332.0 ml LV vol s, MOD A4C: 243.0 ml LV SV MOD A4C:     332.0 ml RIGHT VENTRICLE RV Basal diam:  5.90 cm RV Mid diam:    4.30 cm RV S prime:     9.96 cm/s TAPSE (M-mode): 1.7 cm LEFT ATRIUM              Index        RIGHT ATRIUM           Index LA diam:        4.30 cm  1.67 cm/m   RA Area:     22.80 cm LA Vol (A2C):   170.0 ml 65.92 ml/m  RA Volume:   67.90 ml  26.33 ml/m LA Vol (A4C):   94.8 ml  36.76 ml/m LA Biplane Vol: 132.0 ml 51.19 ml/m  AORTIC VALVE LVOT Vmax:   42.10 cm/s LVOT Vmean:  31.200 cm/s LVOT VTI:    0.063 m  AORTA Ao Root diam: 2.90 cm MITRAL VALVE               TRICUSPID VALVE MV Area (PHT): 4.89 cm    TR Peak grad:   23.0 mmHg MV Decel Time: 155 msec    TR Vmax:        240.00 cm/s MV E velocity: 69.10 cm/s MV A velocity: 34.90 cm/s  SHUNTS MV E/A ratio:  1.98        Systemic VTI:  0.06 m                            Systemic Diam: 2.20 cm Glori Bickers MD Electronically signed by Glori Bickers MD Signature Date/Time: 04/25/2022/12:08:40 PM    Final  Subjective: No significant events overnight, he denies any complaints  Discharge Exam: Vitals:   05/18/22 1010 05/18/22 1127  BP: 118/81 110/78  Pulse: 88 78  Resp: 16 19  Temp:  97.6 F (36.4 C)  SpO2: 99% 100%   Vitals:   05/18/22 1005 05/18/22 1007 05/18/22 1010 05/18/22 1127  BP: 99/64 118/81 118/81 110/78  Pulse: 85 81 88 78  Resp: 14 (!) 21 16 19   Temp:    97.6 F (36.4 C)  TempSrc:    Oral  SpO2: 95% 99% 99% 100%  Weight:      Height:        General: Pt is alert, awake, not in acute distress Cardiovascular: RRR, S1/S2 +, no rubs, no gallops Respiratory: CTA bilaterally, no wheezing, no rhonchi Abdominal: Soft, NT, ND, bowel sounds + Extremities: no edema, no cyanosis    The results of significant diagnostics from  this hospitalization (including imaging, microbiology, ancillary and laboratory) are listed below for reference.     Microbiology: No results found for this or any previous visit (from the past 240 hour(s)).   Labs: BNP (last 3 results) Recent Labs    09/06/21 1157 03/13/22 1530 04/25/22 1241  BNP 367.6* 1,083.1* 979.8*   Basic Metabolic Panel: Recent Labs  Lab 05/17/22 1239  NA 136  K 4.1  CL 100  CO2 25  GLUCOSE 127*  BUN 13  CREATININE 1.20  CALCIUM 9.6   Liver Function Tests: Recent Labs  Lab 05/17/22 1239  AST 31  ALT 23  ALKPHOS 72  BILITOT 0.9  PROT 7.5  ALBUMIN 3.7   No results for input(s): LIPASE, AMYLASE in the last 168 hours. No results for input(s): AMMONIA in the last 168 hours. CBC: Recent Labs  Lab 05/17/22 1239 05/18/22 0037  WBC 6.3 6.1  HGB 17.8* 19.6*  HCT 53.5* 57.9*  MCV 85.1 84.9  PLT 305 258   Cardiac Enzymes: No results for input(s): CKTOTAL, CKMB, CKMBINDEX, TROPONINI in the last 168 hours. BNP: Invalid input(s): POCBNP CBG: Recent Labs  Lab 05/17/22 1311 05/17/22 1633 05/17/22 2054 05/18/22 0731 05/18/22 1129  GLUCAP 130* 134* 178* 147* 129*   D-Dimer No results for input(s): DDIMER in the last 72 hours. Hgb A1c No results for input(s): HGBA1C in the last 72 hours. Lipid Profile No results for input(s): CHOL, HDL, LDLCALC, TRIG, CHOLHDL, LDLDIRECT in the last 72 hours. Thyroid function studies No results for input(s): TSH, T4TOTAL, T3FREE, THYROIDAB in the last 72 hours.  Invalid input(s): FREET3 Anemia work up No results for input(s): VITAMINB12, FOLATE, FERRITIN, TIBC, IRON, RETICCTPCT in the last 72 hours. Urinalysis    Component Value Date/Time   COLORURINE YELLOW 03/20/2022 [place] 03/20/2022 0755   LABSPEC 1.020 03/20/2022 0755   PHURINE 7.0 03/20/2022 0755   GLUCOSEU >=500 (A) 03/20/2022 0755   HGBUR SMALL (A) 03/20/2022 0755   BILIRUBINUR NEGATIVE 03/20/2022 0755   KETONESUR  NEGATIVE 03/20/2022 0755   PROTEINUR NEGATIVE 03/20/2022 0755   NITRITE NEGATIVE 03/20/2022 0755   LEUKOCYTESUR NEGATIVE 03/20/2022 0755   Sepsis Labs Invalid input(s): PROCALCITONIN,  WBC,  LACTICIDVEN Microbiology No results found for this or any previous visit (from the past 240 hour(s)).   Time coordinating discharge: 30 minutes  SIGNED:   Phillips Climes, MD  [facility] 05/18/2022, 12:15 PM Pager   If 7PM-7AM, please contact night-coverage [email or web address] Password [email or web address]

## 2022-05-18 NOTE — Anesthesia Postprocedure Evaluation (Signed)
Anesthesia Post Note  Patient: Robert Mccann  Procedure(s) Performed: COLONOSCOPY POLYPECTOMY HEMOSTASIS CLIP PLACEMENT     Patient location during evaluation: Endoscopy Anesthesia Type: MAC Level of consciousness: awake Pain management: pain level controlled Vital Signs Assessment: post-procedure vital signs reviewed and stable Cardiovascular status: stable Postop Assessment: no apparent nausea or vomiting Anesthetic complications: no   No notable events documented.  Last Vitals:  Vitals:   05/18/22 1010 05/18/22 1127  BP: 118/81 110/78  Pulse: 88 78  Resp: 16 19  Temp:  36.4 C  SpO2: 99% 100%    Last Pain:  Vitals:   05/18/22 1127  TempSrc: Oral  PainSc:                  Jermar Colter

## 2022-05-18 NOTE — Op Note (Addendum)
[name] Procedure Date : 05/18/2022 MRN: [id number] Attending MD: Georgian Co ,  Date of Birth: June 01, 1996 CSN: [id number] Age: 26 Admit Type: Inpatient Procedure:                Colonoscopy Indications:              Rectal bleeding Providers:                Adline Mango" Lady Deutscher, RN, Cherylynn Ridges, Technician, Mora Appl, CRNA Referring MD:             Hospitalist team Medicines:                Monitored Anesthesia Care Complications:            No immediate complications. Estimated Blood Loss:     Estimated blood loss was minimal. Procedure:                Pre-Anesthesia Assessment:                           - Prior to the procedure, a History and Physical                            was performed, and patient medications and                            allergies were reviewed. The patient's tolerance of                            previous anesthesia was also reviewed. The risks                            and benefits of the procedure and the sedation                            options and risks were discussed with the patient.                            All questions were answered, and informed consent                            was obtained. Prior Anticoagulants: The patient has                            taken no previous anticoagulant or antiplatelet                            agents. ASA Grade Assessment: III - A patient with                            severe systemic disease. After reviewing the risks  and benefits, the patient was deemed in                            satisfactory condition to undergo the procedure.                           After obtaining informed consent, the colonoscope                            was passed under direct vision. Throughout the                            procedure, the patient's blood pressure, pulse, and                             oxygen saturations were monitored continuously. The                            [id number]) Olympus coloscope was                            introduced through the anus and advanced to the the                            terminal ileum. The colonoscopy was performed                            without difficulty. The patient tolerated the                            procedure well. The quality of the bowel                            preparation was good. The terminal ileum, ileocecal                            valve, appendiceal orifice, and rectum were                            photographed. Scope In: 9:13:13 AM Scope Out: 9:35:21 AM Scope Withdrawal Time: 0 hours 16 minutes 37 seconds  Total Procedure Duration: 0 hours 22 minutes 8 seconds  Findings:      The terminal ileum appeared normal.      Multiple small-mouthed diverticula were found in the sigmoid colon,       descending colon, transverse colon and ascending colon.      A 3 mm polyp was found in the transverse colon. The polyp was sessile.       The polyp was removed with a cold snare. Resection and retrieval were       complete. To prevent bleeding after the polypectomy, one hemostatic clip       was successfully placed. There was no bleeding at the end of the       procedure.      Non-bleeding internal hemorrhoids were found during retroflexion.      An anal  fissure was found in the anal canal, which was actively bleeding. Impression:               - The examined portion of the ileum was normal.                           - Diverticulosis in the sigmoid colon, in the                            descending colon, in the transverse colon and in                            the ascending colon.                           - One 3 mm polyp in the transverse colon, removed                            with a cold snare. Resected and retrieved. Clip was                            placed.                           -  Non-bleeding internal hemorrhoids.                           - Anal fissure, active bleeding. Recommendation:           - Return patient to hospital ward for ongoing care.                           - It is suspected that the patient is having                            bleeding due to his anal fissure.                           - Patient will need diltiazem 2% and lidocaine 5%                            cream TID as an outpatient for 8 weeks.                           - Anusol HC suppository BID for 7 days.                           - Await pathology results.                           - Okay to restart anticoagulation.                           - The findings and recommendations were discussed  with the patient. Procedure Code(s):        --- Professional ---                           [phone], Colonoscopy, flexible; with removal of                            tumor(s), polyp(s), or other lesion(s) by snare                            technique Diagnosis Code(s):        --- Professional ---                           K64.8, Other hemorrhoids                           K63.5, Polyp of colon                           K60.2, Anal fissure, unspecified                           K62.5, Hemorrhage of anus and rectum                           K57.30, Diverticulosis of large intestine without                            perforation or abscess without bleeding CPT copyright 2019 American Medical Association. All rights reserved. The codes documented in this report are preliminary and upon coder review may  be revised to meet current compliance requirements. Sonny Masters "Christia Reading,  05/18/2022 10:06:02 AM Number of Addenda: 0

## 2022-05-18 NOTE — TOC Initial Note (Signed)
Transition of Care [facility]) - Initial/Assessment Note    Patient Details  Name: Robert Mccann MRN: [id number] Date of Birth: 1996/04/06  [facility] CM/SW Contact:    Carles Collet, RN Phone Number: 05/18/2022, 9:53 AM  Clinical Narrative:          Patient from home Active at [facility] for cardio/pulm rehab. No HH needs identified.  PCP- [place] Coverage- [facility]      Pharmacy- [facility] [place]    TOC will continue to follow. No needs identified at this time  Expected Discharge Plan: Home/Self Care Barriers to Discharge: Continued Medical Work up   Patient Goals and CMS Choice        Expected Discharge Plan and Services Expected Discharge Plan: Home/Self Care                                              Prior Living Arrangements/Services                       Activities of Daily Living Home Assistive Devices/Equipment: None ADL Screening (condition at time of admission) Patient's cognitive ability adequate to safely complete daily activities?: Yes Is the patient deaf or have difficulty hearing?: No Does the patient have difficulty seeing, even when wearing glasses/contacts?: No Does the patient have difficulty concentrating, remembering, or making decisions?: No Patient able to express need for assistance with ADLs?: Yes Does the patient have difficulty dressing or bathing?: No Independently performs ADLs?: Yes (appropriate for developmental age) Does the patient have difficulty walking or climbing stairs?: No Weakness of Legs: None Weakness of Arms/Hands: None  Permission Sought/Granted                  Emotional Assessment              Admission diagnosis:  Rectal bleeding [K62.5] Lower GI bleed [K92.2] Patient Active Problem List   Diagnosis Date Noted   Lower GI bleed 05/17/2022   Rectal bleeding    Cardiomyopathy ([place])    Chronic congestive heart failure ([place])    Essential hypertension  03/21/2022   AKI (acute kidney injury) ([place]) 03/21/2022   Type 2 diabetes mellitus with hyperlipidemia ([place]) 03/21/2022   Shock liver 03/21/2022   Class 3 obesity ([place]) 03/21/2022   Iron deficiency 03/21/2022   Acute on chronic systolic (congestive) heart failure ([place]) 03/16/2022   Acute on chronic HFrEF (heart failure with reduced ejection fraction) ([place])    LV (left ventricular) mural thrombus    NSTEMI (non-ST elevated myocardial infarction) ([place]) 03/13/2022   Diabetes mellitus type 2, noninsulin dependent ([place]) 03/13/2022   Hyperlipidemia 03/13/2022   Acute pulmonary embolism ([place]) 03/13/2022   Cerebrovascular accident (CVA) ([place]) 11/05/2021   PE (pulmonary thromboembolism) ([place]) 06/28/2021   Acute pulmonary embolism with acute cor pulmonale (HCC)    Obesity, Class III, BMI 40-49.9 (morbid obesity) ([place])    DM II (diabetes mellitus, type II), controlled ([place]) 06/27/2021   Chest pain 06/27/2021   HTN (hypertension) 06/27/2021   Malaise 05/25/2014   Routine history and physical examination of adult 05/25/2014   Obesity 05/25/2014   PCP:  Ellene Route Pharmacy:   [facility] [address], [place] [address] Phone: [phone] Fax: [phone]     Social Determinants of Health (SDOH)  Interventions    Readmission Risk Interventions     View : No data to display.

## 2022-05-18 NOTE — Progress Notes (Signed)
Heart Failure Navigator Progress Note  Assessed for Heart & Vascular TOC clinic readiness.  Patient does not meet criteria due to patient of Advance Heart Failure Team. consult.     Rhae Hammock, BSN, Scientist, clinical (histocompatibility and immunogenetics) Only

## 2022-05-18 NOTE — Discharge Instructions (Signed)
Follow with Primary MD [facility] in 7 days   Get CBC, CMP,  checked  by Primary MD next visit.    Activity: As tolerated with Full fall precautions use walker/cane & assistance as needed   Disposition Home    Diet: Heart Healthy /High Fiber Diet   On your next visit with your primary care physician please Get Medicines reviewed and adjusted.   Please request your Prim.MD to go over all Hospital Tests and Procedure/Radiological results at the follow up, please get all Hospital records sent to your Prim MD by signing hospital release before you go home.   If you experience worsening of your admission symptoms, develop shortness of breath, life threatening emergency, suicidal or homicidal thoughts you must seek medical attention immediately by calling 911 or calling your MD immediately  if symptoms less severe.  You Must read complete instructions/literature along with all the possible adverse reactions/side effects for all the Medicines you take and that have been prescribed to you. Take any new Medicines after you have completely understood and accpet all the possible adverse reactions/side effects.   Do not drive, operating heavy machinery, perform activities at heights, swimming or participation in water activities or provide baby sitting services if your were admitted for syncope or siezures until you have seen by Primary MD or a Neurologist and advised to do so again.  Do not drive when taking Pain medications.    Do not take more than prescribed Pain, Sleep and Anxiety Medications  Special Instructions: If you have smoked or chewed Tobacco  in the last 2 yrs please stop smoking, stop any regular Alcohol  and or any Recreational drug use.  Wear Seat belts while driving.   Please note  You were cared for by a hospitalist during your hospital stay. If you have any questions about your discharge medications or the care you received while you were in the hospital  after you are discharged, you can call the unit and asked to speak with the hospitalist on call if the hospitalist that took care of you is not available. Once you are discharged, your primary care physician will handle any further medical issues. Please note that NO REFILLS for any discharge medications will be authorized once you are discharged, as it is imperative that you return to your primary care physician (or establish a relationship with a primary care physician if you do not have one) for your aftercare needs so that they can reassess your need for medications and monitor your lab values.

## 2022-05-18 NOTE — Anesthesia Preprocedure Evaluation (Addendum)
Anesthesia Evaluation  Patient identified by MRN, date of birth, ID band Patient awake    Reviewed: Allergy & Precautions, NPO status , Patient's Chart, lab work & pertinent test results  Airway Mallampati: II  TM Distance: >3 FB     Dental   Pulmonary neg pulmonary ROS,    breath sounds clear to auscultation       Cardiovascular hypertension, + Past MI and +CHF  + dysrhythmias  Rhythm:Regular Rate:Normal     Neuro/Psych CVA    GI/Hepatic (+) Hepatitis -History noted Dr. Nyoka Cowden   Endo/Other  diabetes  Renal/GU Renal disease     Musculoskeletal   Abdominal   Peds  Hematology   Anesthesia Other Findings   Reproductive/Obstetrics                             Anesthesia Physical Anesthesia Plan  ASA: 3  Anesthesia Plan: MAC   Post-op Pain Management:    Induction: Intravenous  PONV Risk Score and Plan: 1 and Treatment may vary due to age or medical condition and Propofol infusion  Airway Management Planned: Nasal Cannula and Simple Face Mask  Additional Equipment:   Intra-op Plan:   Post-operative Plan:   Informed Consent:     Dental advisory given  Plan Discussed with: CRNA and Anesthesiologist  Anesthesia Plan Comments:         Anesthesia Quick Evaluation

## 2022-05-18 NOTE — Anesthesia Procedure Notes (Signed)
Procedure Name: MAC Date/Time: 05/18/2022 9:07 AM Performed by: Janace Litten, CRNA Pre-anesthesia Checklist: Patient identified, Emergency Drugs available, Suction available and Patient being monitored Patient Re-evaluated:Patient Re-evaluated prior to induction Oxygen Delivery Method: Simple face mask

## 2022-05-21 ENCOUNTER — Encounter: Payer: 59 | Admitting: *Deleted

## 2022-05-21 DIAGNOSIS — I5023 Acute on chronic systolic (congestive) heart failure: Secondary | ICD-10-CM | POA: Diagnosis not present

## 2022-05-21 DIAGNOSIS — I5022 Chronic systolic (congestive) heart failure: Secondary | ICD-10-CM

## 2022-05-21 LAB — SURGICAL PATHOLOGY

## 2022-05-21 NOTE — Progress Notes (Signed)
Daily Session Note  Patient Details  Name: Robert Mccann MRN: [id number] Date of Birth: 24-Jan-1996 Referring Provider:   Flowsheet Row Cardiac Rehab from 04/19/2022 in [facility] Cardiac and Pulmonary Rehab  Referring Provider Glori Bickers MD       Encounter Date: 05/21/2022  Check In:  Session Check In - 05/21/22 1144       Check-In   Supervising physician immediately available to respond to emergencies See telemetry face sheet for immediately available ER MD    Location [facility]-Cardiac & Pulmonary Rehab    Staff Present Heath Lark, RN, BSN, CCRP;Kelly Bollinger, MPA, [place], BS, ACSM CEP, Exercise Physiologist    Virtual Visit No    Medication changes reported     No    Fall or balance concerns reported    No    Warm-up and Cool-down Performed on first and last piece of equipment    Resistance Training Performed Yes    VAD Patient? No    PAD/SET Patient? No      Pain Assessment   Currently in Pain? No/denies                Social History   Tobacco Use  Smoking Status Never  Smokeless Tobacco Never    Goals Met:  Independence with exercise equipment Exercise tolerated well No report of concerns or symptoms today  Goals Unmet:  Not Applicable  Comments: Pt able to follow exercise prescription today without complaint.  Will continue to monitor for progression.    Dr. Emily Filbert is Medical Director for [place].  Dr. Ottie Glazier is Medical Director for [facility] Pulmonary Rehabilitation.

## 2022-05-23 DIAGNOSIS — I5023 Acute on chronic systolic (congestive) heart failure: Secondary | ICD-10-CM | POA: Diagnosis not present

## 2022-05-23 DIAGNOSIS — I5022 Chronic systolic (congestive) heart failure: Secondary | ICD-10-CM

## 2022-05-23 NOTE — Progress Notes (Signed)
Daily Session Note  Patient Details  Name: Robert Mccann MRN: [id number] Date of Birth: [birth year] Referring Provider:   Flowsheet Row Cardiac Rehab from 04/19/2022 in [facility] Cardiac and Pulmonary Rehab  Referring Provider Glori Bickers MD       Encounter Date: 05/23/2022  Check In:  Session Check In - 05/23/22 1032       Check-In   Supervising physician immediately available to respond to emergencies See telemetry face sheet for immediately available ER MD    Location [facility]-Cardiac & Pulmonary Rehab    Staff Present Birdie Sons, MPA, RN;Jessica Crosby, MA, RCEP, CCRP, CCET;Joseph [place], [place]    Virtual Visit No    Medication changes reported     No    Fall or balance concerns reported    No    Warm-up and Cool-down Performed on first and last piece of equipment    Resistance Training Performed Yes    VAD Patient? No    PAD/SET Patient? No      Pain Assessment   Currently in Pain? No/denies                Social History   Tobacco Use  Smoking Status Never  Smokeless Tobacco Never    Goals Met:  Independence with exercise equipment Exercise tolerated well No report of concerns or symptoms today Strength training completed today  Goals Unmet:  Not Applicable  Comments: Pt able to follow exercise prescription today without complaint.  Will continue to monitor for progression.    Dr. Emily Filbert is Medical Director for [place] [facility].  Dr. Ottie Glazier is Medical Director for [facility] Pulmonary Rehabilitation.

## 2022-05-25 ENCOUNTER — Encounter: Payer: 59 | Admitting: *Deleted

## 2022-05-25 DIAGNOSIS — I5023 Acute on chronic systolic (congestive) heart failure: Secondary | ICD-10-CM | POA: Diagnosis not present

## 2022-05-25 DIAGNOSIS — I5022 Chronic systolic (congestive) heart failure: Secondary | ICD-10-CM

## 2022-05-25 NOTE — Progress Notes (Signed)
Daily Session Note  Patient Details  Name: Robert Mccann MRN: [id number] Date of Birth: July 27, 1996 Referring Provider:   Flowsheet Row Cardiac Rehab from 04/19/2022 in [facility] Cardiac and Pulmonary Rehab  Referring Provider Glori Bickers MD       Encounter Date: 05/25/2022  Check In:  Session Check In - 05/25/22 1130       Check-In   Supervising physician immediately available to respond to emergencies See telemetry face sheet for immediately available ER MD    Location [facility]-Cardiac & Pulmonary Rehab    Staff Present Heath Lark, RN, BSN, CCRP;Joseph [place], RCP,RRT,BSRT;Jessica [place], [place], [place], CCRP, CCET    Virtual Visit No    Medication changes reported     No    Fall or balance concerns reported    No    Warm-up and Cool-down Performed on first and last piece of equipment    Resistance Training Performed Yes    VAD Patient? No    PAD/SET Patient? No      Pain Assessment   Currently in Pain? No/denies                Social History   Tobacco Use  Smoking Status Never  Smokeless Tobacco Never    Goals Met:  Independence with exercise equipment Exercise tolerated well No report of concerns or symptoms today  Goals Unmet:  Not Applicable  Comments: Pt able to follow exercise prescription today without complaint.  Will continue to monitor for progression.    Dr. Emily Filbert is Medical Director for [place].  Dr. Ottie Glazier is Medical Director for [facility] Pulmonary Rehabilitation.

## 2022-05-30 ENCOUNTER — Encounter: Payer: Self-pay | Admitting: Internal Medicine

## 2022-05-30 ENCOUNTER — Inpatient Hospital Stay: Payer: 59 | Attending: Internal Medicine | Admitting: Internal Medicine

## 2022-05-30 ENCOUNTER — Inpatient Hospital Stay: Payer: 59

## 2022-05-30 DIAGNOSIS — I509 Heart failure, unspecified: Secondary | ICD-10-CM | POA: Insufficient documentation

## 2022-05-30 DIAGNOSIS — Z7901 Long term (current) use of anticoagulants: Secondary | ICD-10-CM | POA: Diagnosis not present

## 2022-05-30 DIAGNOSIS — E669 Obesity, unspecified: Secondary | ICD-10-CM | POA: Insufficient documentation

## 2022-05-30 DIAGNOSIS — D751 Secondary polycythemia: Secondary | ICD-10-CM

## 2022-05-30 DIAGNOSIS — Z8673 Personal history of transient ischemic attack (TIA), and cerebral infarction without residual deficits: Secondary | ICD-10-CM | POA: Diagnosis not present

## 2022-05-30 DIAGNOSIS — Z86711 Personal history of pulmonary embolism: Secondary | ICD-10-CM | POA: Insufficient documentation

## 2022-05-30 HISTORY — DX: Secondary polycythemia: D75.1

## 2022-05-30 LAB — COMPREHENSIVE METABOLIC PANEL
ALT: 24 U/L (ref 0–44)
AST: 33 U/L (ref 15–41)
Albumin: 3.7 g/dL (ref 3.5–5.0)
Alkaline Phosphatase: 83 U/L (ref 38–126)
Anion gap: 8 (ref 5–15)
BUN: 11 mg/dL (ref 6–20)
CO2: 26 mmol/L (ref 22–32)
Calcium: 8.7 mg/dL — ABNORMAL LOW (ref 8.9–10.3)
Chloride: 101 mmol/L (ref 98–111)
Creatinine, Ser: 0.94 mg/dL (ref 0.61–1.24)
GFR, Estimated: >60 mL/min (ref >60)
Glucose, Bld: 135 mg/dL — ABNORMAL HIGH (ref 70–99)
Potassium: 3.8 mmol/L (ref 3.5–5.1)
Sodium: 135 mmol/L (ref 135–145)
Total Bilirubin: 0.5 mg/dL (ref 0.3–1.2)
Total Protein: 7.9 g/dL (ref 6.5–8.1)

## 2022-05-30 LAB — CBC WITH DIFFERENTIAL/PLATELET
Abs Immature Granulocytes: 0.01 10*3/uL (ref 0.00–0.07)
Basophils Absolute: 0 10*3/uL (ref 0.0–0.1)
Basophils Relative: 0 %
Eosinophils Absolute: 0.1 10*3/uL (ref 0.0–0.5)
Eosinophils Relative: 1 %
HCT: 49.8 % (ref 39.0–52.0)
Hemoglobin: 16.6 g/dL (ref 13.0–17.0)
Immature Granulocytes: 0 %
Lymphocytes Relative: 37 %
Lymphs Abs: 2.2 10*3/uL (ref 0.7–4.0)
MCH: 28.2 pg (ref 26.0–34.0)
MCHC: 33.3 g/dL (ref 30.0–36.0)
MCV: 84.6 fL (ref 80.0–100.0)
Monocytes Absolute: 0.5 10*3/uL (ref 0.1–1.0)
Monocytes Relative: 8 %
Neutro Abs: 3.1 10*3/uL (ref 1.7–7.7)
Neutrophils Relative %: 54 %
Platelets: 333 10*3/uL (ref 150–400)
RBC: 5.89 MIL/uL — ABNORMAL HIGH (ref 4.22–5.81)
RDW: 14.6 % (ref 11.5–15.5)
WBC: 5.9 10*3/uL (ref 4.0–10.5)
nRBC: 0 % (ref 0.0–0.2)

## 2022-05-30 NOTE — Progress Notes (Signed)
New patient evaluation.    Bp in office today 115/75, HR 42.  Patient reports increased fatigue.  Advised he should call his cardiologist to update them on his HR and fatigue.

## 2022-05-30 NOTE — Progress Notes (Signed)
Robert Mccann NOTE  Patient Care Team: Robert Mccann as PCP - General Robert Sable, MD as PCP - Cardiology (Cardiology) Robert Sickle, MD as Consulting Physician (Oncology)  CHIEF COMPLAINTS/PURPOSE OF CONSULTATION: ERYTHROCYTOSIS   HEMATOLOGY HISTORY  # ERYTHROCYTOSIS: # MAY 2023- JG81; HCT- 57.8  # June 2023- PE s/p clot buster [[facility];]- MARCH 2023- acute PE [iwhile not on eliquis inurance/ on in]; MARCH 2023- LV thrombus [Dr.Agbar/ Dr.benihem]; Dilated CMP [awaiting heart transplant list]  #Diabetes  HISTORY OF PRESENTING ILLNESS: Obese patient.  Ambulating independently.  Accompanied by his girlfriend Robert Mccann 26 y.o.  male pleasant patient with a complicated cardiac history; and also history of PE x2 has been referred to [place] for further evaluation of erythrocytosis incidentally noted on routine blood work.  Patient has a prior history of PE in 2022.  However patient stopped anticoagulation after few months because of financial reasons.  In March 2023 patient had a repeat blood clot in his longstanding thrombolysis.  Patient also had a complicated history of dilated cardiomyopathy/CHF.  Patient also noted to have an LV thrombus in 2023.  Patient admits to being on Eliquis/admits compliance.  Smoking: none Lung Disease/COPD: none OSA/CPAP: snoring; but no formal sleep study. 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    Review of Systems  Constitutional:  Positive for malaise/fatigue. Negative for chills, diaphoresis, fever and weight loss.  HENT:  Negative for nosebleeds and sore throat.   Eyes:  Negative for double vision.  Respiratory:  Negative for cough, hemoptysis, sputum production, shortness of breath and wheezing.   Cardiovascular:  Negative for chest pain, palpitations, orthopnea and leg swelling.  Gastrointestinal:  Negative for abdominal pain, blood in stool, constipation, diarrhea, heartburn, melena, nausea and vomiting.   Genitourinary:  Negative for dysuria, frequency and urgency.  Musculoskeletal:  Negative for back pain and joint pain.  Skin: Negative.  Negative for itching and rash.  Neurological:  Negative for dizziness, tingling, focal weakness, weakness and headaches.  Endo/Heme/Allergies:  Does not bruise/bleed easily.  Psychiatric/Behavioral:  Negative for depression. The patient is not nervous/anxious and does not have insomnia.     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 05/2021 noted during periods of sleep @ time of hospitalization for PE.   Cardiomyopathy ([place])    a. 05/2021 Echo: EF <20%, glob HK. [place]:C ratio of 2.1:1 suggesting non-compaction. Gr2 DD. Sev reduced RV fxn. Nl PASP. Mildly dil RA. Mild MR; b. 10/2021 Echo: EF <20%, GrII DD; c. 10/2021 TEE: EF 20-25%, glob HK, sev red RV fxn.   Diabetes mellitus without complication ([place])    HFrEF (heart failure with reduced ejection fraction) ([place])    a. 05/2021 Echo: EF <20%. ? non-compaction.   Hypertension    Pulmonary embolism ([place])    a. 05/2021 CTA Chest: PE extending from lobar arteris of RUL and RLL w/ concern for pulml infarct-->s/p thrombolysis and thrombectomy of RUL/RML/RLL.   Stroke [facility])    a. 10/2021 CT Head: Bilat cerebral infarcts w/ mod-sided acute to subacute R temporoparietal infarct; b. 10/2021 MRI: R post PCA territory infarcts w/o hemorrhage; c. 10/2021 TEE: Neg bubble study. No LA/LAA thrombus. EF 20-25%, glob HK.    SURGICAL HISTORY: Past Surgical History:  Procedure Laterality Date   COLONOSCOPY N/A 05/18/2022   Procedure: COLONOSCOPY;  Surgeon: Sharyn Creamer, MD;  Location: [place];  Service: Gastroenterology;  Laterality: N/A;   HAND SURGERY Left  age 31   HAND SURGERY     HEMOSTASIS CLIP PLACEMENT  05/18/2022   Procedure: HEMOSTASIS CLIP PLACEMENT;  Surgeon: Sharyn Creamer, MD;  Location: [place];  Service: Gastroenterology;;   POLYPECTOMY  05/18/2022    Procedure: POLYPECTOMY;  Surgeon: Sharyn Creamer, MD;  Location: [place];  Service: Gastroenterology;;   PULMONARY THROMBECTOMY N/A 06/28/2021   Procedure: PULMONARY THROMBECTOMY;  Surgeon: Algernon Huxley, MD;  Location: [place] [facility];  Service: Cardiovascular;  Laterality: N/A;   TEE WITHOUT CARDIOVERSION N/A 11/08/2021   Procedure: TRANSESOPHAGEAL ECHOCARDIOGRAM (TEE);  Surgeon: Robert Sable, MD;  Location: [facility];  Service: Cardiovascular;  Laterality: N/A;    SOCIAL HISTORY: Social History   Socioeconomic History   Marital status: Single    Spouse name: Not on file   Number of children: Not on file   Years of education: Not on file   Highest education level: Not on file  Occupational History   Not on file  Tobacco Use   Smoking status: Never   Smokeless tobacco: Never  Vaping Use   Vaping Use: Never used  Substance and Sexual Activity   Alcohol use: Not Currently    Comment: special occasions   Drug use: Not Currently    Types: Marijuana    Comment: last month   Sexual activity: Yes    Partners: Female  Other Topics Concern   Not on file  Social History Narrative   Smokes weed; smoked last feb, 2023. No smoking cigarettes; no alcohol. 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 Lives in [place].  NO children.    Social Determinants of Health   Financial Resource Strain: Not on file  Food Insecurity: Not on file  Transportation Needs: Not on file  Physical Activity: Not on file  Stress: Not on file  Social Connections: Not on file  Intimate Partner Violence: Not on file    FAMILY HISTORY: Family History  Problem Relation Age of Onset   Heart failure Mother    Cerebrovascular Accident Mother    Diabetes Father    Hypertension Father    Cancer Cousin        unknown origin   Cancer Cousin        unknown origing    ALLERGIES:  has No Known Allergies.  MEDICATIONS:  Current Outpatient Medications  Medication Sig Dispense Refill    AMBULATORY NON FORMULARY MEDICATION Diltiazem 2% compounded with lidocaine 5% ointment applied to the affected area 3 times daily x 8 weeks 1 each 1   apixaban (ELIQUIS) 5 MG TABS tablet Take 1 tablet (5 mg total) by mouth 2 (two) times daily. 60 tablet 6   Bempedoic Acid-Ezetimibe (NEXLIZET) 180-10 MG TABS Take 1 tablet by mouth daily. 90 tablet 3   BIOTIN PO Take 1 capsule by mouth daily.     Blood Glucose Monitoring Suppl (BLOOD GLUCOSE MONITOR SYSTEM) w/Device KIT Use up to four times daily as directed. 1 kit 3   digoxin (LANOXIN) 0.125 MG tablet Take 1 tablet (0.125 mg total) by mouth daily. 30 tablet 6   FARXIGA 10 MG TABS tablet Take 10 mg by mouth daily.     furosemide (LASIX) 40 MG tablet Take 2 tablets (80 mg total) by mouth daily. 60 tablet 6   metFORMIN (GLUCOPHAGE) 500 MG tablet Take 1 tablet (500 mg total) by mouth 2 (two) times daily with a meal. 60 tablet 0   Multiple Vitamin (MULTIVITAMIN WITH MINERALS) TABS tablet Take 1 tablet  by mouth daily.     potassium chloride (KLOR-CON) 20 MEQ packet Take 40 mEq by mouth daily. Take on days you take Lasix. 100 packet 6   rosuvastatin (CRESTOR) 40 MG tablet Take 1 tablet (40 mg total) by mouth daily. 90 tablet 3   sacubitril-valsartan (ENTRESTO) 97-103 MG Take 1 tablet by mouth 2 (two) times daily. 60 tablet 6   spironolactone (ALDACTONE) 25 MG tablet Take 1 tablet (25 mg total) by mouth daily. 30 tablet 6   carvedilol (COREG) 3.125 MG tablet Take 3.125 mg by mouth 2 (two) times daily. (Patient not taking: Reported on 05/17/2022)     Semaglutide, 1 MG/DOSE, 4 MG/3ML SOPN Inject 1 mg into the skin once a week. 3 mL 0   No current facility-administered medications for this visit.     Marland Kitchen  PHYSICAL EXAMINATION:   Vitals:   05/30/22 1123  BP: 115/75  Pulse: (!) 42  Resp: 18  Temp: 98 F (36.7 C)   Filed Weights   05/30/22 1123  Weight: (!) 323 lb 9.6 oz (146.8 kg)    Physical Exam Vitals and nursing note reviewed.   Constitutional:      Comments:     HENT:     Head: Normocephalic and atraumatic.     Mouth/Throat:     Mouth: Mucous membranes are moist.     Pharynx: No oropharyngeal exudate.  Eyes:     Pupils: Pupils are equal, round, and reactive to light.  Cardiovascular:     Rate and Rhythm: Normal rate and regular rhythm.  Pulmonary:     Effort: No respiratory distress.     Breath sounds: No wheezing.     Comments: Decreased breath sounds bilaterally at bases.  No wheeze or crackles Abdominal:     General: Bowel sounds are normal. There is no distension.     Palpations: Abdomen is soft. There is no mass.     Tenderness: There is no abdominal tenderness. There is no guarding or rebound.  Musculoskeletal:        General: No tenderness. Normal range of motion.     Cervical back: Normal range of motion and neck supple.  Skin:    General: Skin is warm.  Neurological:     Mental Status: He is alert and oriented to person, place, and time.  Psychiatric:        Mood and Affect: Affect normal.        Judgment: Judgment normal.     LABORATORY DATA:  I have reviewed the data as listed Lab Results  Component Value Date   WBC 5.9 05/30/2022   HGB 16.6 05/30/2022   HCT 49.8 05/30/2022   MCV 84.6 05/30/2022   PLT 333 05/30/2022   Recent Labs    03/13/22 1530 03/14/22 0503 03/21/22 0400 03/28/22 1223 04/25/22 1241 04/30/22 1414 05/17/22 1239 05/30/22 1224  NA 130*   < > 130*   < > 136  --  136 135  K 3.8   < > 4.9   < > 4.3  --  4.1 3.8  CL 96*   < > 98   < > 98  --  100 101  CO2 27   < > 22   < > 29  --  25 26  GLUCOSE 282*   < > 148*   < > 123*  --  127* 135*  BUN 15   < > 10   < > 11  --  13 11  CREATININE 1.21   < > 1.01   < > 1.12  --  1.20 0.94  CALCIUM 8.6*   < > 9.0   < > 9.5  --  9.6 8.7*  GFRNONAA >60   < > >60   < > >60  --  >60 >60  PROT 7.3   < > 6.8   < >  --  7.2 7.5 7.9  ALBUMIN 2.9*   < > 2.6*   < >  --  3.9* 3.7 3.7  AST 118*   < > 151*   < >  --  23 31 33   ALT 129*   < > 441*   < >  --  15 23 24   ALKPHOS 119   < > 163*   < >  --  113 72 83  BILITOT 1.1   < > 1.1   < >  --  0.7 0.9 0.5  BILIDIR 0.4*  --  0.3*  --   --  0.20  --   --   IBILI 0.7  --  0.8  --   --   --   --   --    < > = values in this interval not displayed.     DG Chest 2 View  Result Date: 05/17/2022 CLINICAL DATA:  Provided history: Chest pain. EXAM: CHEST - 2 VIEW COMPARISON:  Chest radiographs 03/13/2022 and earlier. Chest CT 03/13/2022. FINDINGS: Cardiomegaly. No appreciable airspace consolidation or pulmonary edema. No evidence of pleural effusion or pneumothorax. No acute bony abnormality identified. IMPRESSION: Cardiomegaly. No radiographic evidence of acute cardiopulmonary abnormality. Please refer to the recent prior chest CT of 03/13/2022 for a description of pulmonary emboli. Electronically Signed   By: Kellie Simmering D.O.   On: 05/17/2022 13:00    ASSESSMENT & PLAN:   Erythrocytosis #Erythrocytosis-question symptoms. at a long discussion the patient regarding the potential causes of her abnormal blood counts-both primary bone marrow problem versus reactive/secondary causes.  Clinically suspect patient's erythrocytosis-easy secondary [morbid obesity; suspected OSA; use of diuretics].   #   For now I  recommend checking CBC;CMP jak 2; LDHreview of peripheral smear. Check peripheral smear. Also discussed regarding bone marrow biopsy with the above workup is inconclusive; however I would prefer not to do a bone marrow unless absolutely needed.  #I discussed the role of phlebotomy in bringing the hematocrit/hemoglobin down-avoid any thromboembolic events.  The role of phlebotomy in secondary erythrocytosis is unclear/controversial.  However, phlebotomy is a standard of care [goal hematocrit less than 45] for polycythemia vera.   # Hx of PE-x2 patient will be on indefinite anticoagulation.  Currently on Eliquis.  No further blood clots.  #Complicated cardiac history/CHF  clinically stable.  Recommend continued follow-up with cardiology.  # I discussed the potential concerns for stroke with elevated platelets. Patient is on aspirin.   # DISPOSITION: # labs today- ordered # follow up in 2-3 weeks; MD: no labs-- Dr.B  Thank you Dr.Elgerway  for allowing me to participate in the care of your pleasant patient. Please do not hesitate to contact me with questions or concerns in the interim.    All questions were answered. The patient knows to call the clinic with any problems, questions or concerns.  Thank you Dr. for allowing me to participate in the care of your pleasant patient. Please do not hesitate to contact me with questions or concerns in the interim.   Robert Sickle, MD 06/03/2022 10:19 PM   #  45 minutes face-to-face with the patient discussing the above plan of care; more than 50% of time spent on counseling and coordination.

## 2022-05-30 NOTE — Assessment & Plan Note (Addendum)
#  Erythrocytosis-question symptoms. at a long discussion the patient regarding the potential causes of her abnormal blood counts-both primary bone marrow problem versus reactive/secondary causes.  Clinically suspect patient's erythrocytosis-easy secondary [morbid obesity; suspected OSA; use of diuretics].   #   For now I  recommend checking CBC;CMP jak 2; LDHreview of peripheral smear. Check peripheral smear. Also discussed regarding bone marrow biopsy with the above workup is inconclusive; however I would prefer not to do a bone marrow unless absolutely needed.  #I discussed the role of phlebotomy in bringing the hematocrit/hemoglobin down-avoid any thromboembolic events.  The role of phlebotomy in secondary erythrocytosis is unclear/controversial.  However, phlebotomy is a standard of care [goal hematocrit less than 45] for polycythemia vera.   # Hx of PE-x2 patient will be on indefinite anticoagulation.  Currently on Eliquis.  No further blood clots.  #Complicated cardiac history/CHF clinically stable.  Recommend continued follow-up with cardiology.  # I discussed the potential concerns for stroke with elevated platelets. Patient is on aspirin.   # DISPOSITION: # labs today- ordered # follow up in 2-3 weeks; MD: no labs-- Dr.B  Thank you Dr.Elgerway  for allowing me to participate in the care of your pleasant patient. Please do not hesitate to contact me with questions or concerns in the interim.

## 2022-05-31 LAB — BETA-2-GLYCOPROTEIN I ABS, IGG/M/A
Beta-2 Glyco I IgG: 11 GPI IgG units (ref 0–20)
Beta-2-Glycoprotein I IgA: 46 GPI IgA units — ABNORMAL HIGH (ref 0–25)
Beta-2-Glycoprotein I IgM: <9 GPI IgM units (ref 0–32)

## 2022-05-31 LAB — ERYTHROPOIETIN: Erythropoietin: 5.6 m[IU]/mL (ref 2.6–18.5)

## 2022-05-31 LAB — TESTOSTERONE: Testosterone: 456 ng/dL (ref 264–916)

## 2022-06-01 ENCOUNTER — Encounter: Payer: 59 | Attending: Internal Medicine

## 2022-06-01 ENCOUNTER — Telehealth: Payer: Self-pay | Admitting: Pharmacist

## 2022-06-01 DIAGNOSIS — I5022 Chronic systolic (congestive) heart failure: Secondary | ICD-10-CM | POA: Insufficient documentation

## 2022-06-01 LAB — DRVVT CONFIRM: dRVVT Confirm: 1 ratio (ref 0.8–1.2)

## 2022-06-01 LAB — ANTIPHOSPHOLIPID SYNDROME PROF
Anticardiolipin IgG: <9 GPL U/mL (ref 0–14)
Anticardiolipin IgM: <9 MPL U/mL (ref 0–12)
DRVVT: 52.9 s — ABNORMAL HIGH (ref 0.0–47.0)
PTT Lupus Anticoagulant: 46.1 s — ABNORMAL HIGH (ref 0.0–43.5)

## 2022-06-01 LAB — DRVVT MIX: dRVVT Mix: 42.4 s — ABNORMAL HIGH (ref 0.0–40.4)

## 2022-06-01 LAB — PTT-LA MIX: PTT-LA Mix: 38.8 s (ref 0.0–40.5)

## 2022-06-01 LAB — HEXAGONAL PHASE PHOSPHOLIPID: Hexagonal Phase Phospholipid: 6 s (ref 0–11)

## 2022-06-01 LAB — PTT-LA INCUB MIX: PTT-LA Incub Mix: 41.4 s — ABNORMAL HIGH (ref 0.0–40.5)

## 2022-06-01 MED ORDER — SEMAGLUTIDE (1 MG/DOSE) 4 MG/3ML ~~LOC~~ SOPN: 1.0000 mg | PEN_INJECTOR | SUBCUTANEOUS | 0 refills | Status: DC

## 2022-06-01 NOTE — Telephone Encounter (Signed)
Pt returned call to clinic. Reports tolerating Ozempic well, no GI side effects. Reports he has not noticed a change in his weight or with his appetite. Will increase to 1mg  dose for next month, rx sent to pharmacy.

## 2022-06-01 NOTE — Telephone Encounter (Signed)
Called pt and left message to follow up with Ozempic tolerability. Will plan to increase to 1mg  weekly dose if tolerating well.

## 2022-06-01 NOTE — Addendum Note (Signed)
Addended by: Leaann Nevils E on: 06/01/2022 12:44 PM   Modules accepted: Orders

## 2022-06-04 ENCOUNTER — Encounter: Payer: 59 | Admitting: *Deleted

## 2022-06-04 DIAGNOSIS — I5022 Chronic systolic (congestive) heart failure: Secondary | ICD-10-CM

## 2022-06-04 NOTE — Progress Notes (Signed)
Daily Session Note  Patient Details  Name: Robert Mccann MRN: [id number] Date of Birth: 06/16/1996 Referring Provider:   Flowsheet Row Cardiac Rehab from 04/19/2022 in [facility]iac [facility] Rehab  Referring Provider Glori Bickers MD       Encounter Date: 06/04/2022  Check In:  Session Check In - 06/04/22 1138       Check-In   Supervising physician immediately available to respond to emergencies See telemetry face sheet for immediately available ER MD    Location [facility]-Cardiac & Pulmonary Rehab    Staff Present Heath Lark, RN, BSN, CCRP;Kelly Rockwall, MPA, Mauricia Area, BS, ACSM CEP, Exercise Physiologist;Kara Eliezer Bottom, MS, ASCM CEP, Exercise Physiologist    Virtual Visit No    Medication changes reported     No    Fall or balance concerns reported    No    Warm-up and Cool-down Performed on first and last piece of equipment    Resistance Training Performed Yes    VAD Patient? No    PAD/SET Patient? No      Pain Assessment   Currently in Pain? No/denies                Social History   Tobacco Use  Smoking Status Never  Smokeless Tobacco Never    Goals Met:  Independence with exercise equipment Exercise tolerated well No report of concerns or symptoms today  Goals Unmet:  Not Applicable  Comments: Pt able to follow exercise prescription today without complaint.  Will continue to monitor for progression.    Dr. Emily Filbert is Medical Director for [place].  Dr. Ottie Glazier is Medical Director for [facility] Pulmonary Rehabilitation.

## 2022-06-06 ENCOUNTER — Other Ambulatory Visit: Payer: Self-pay | Admitting: Lab

## 2022-06-06 DIAGNOSIS — I5022 Chronic systolic (congestive) heart failure: Secondary | ICD-10-CM

## 2022-06-06 DIAGNOSIS — I5023 Acute on chronic systolic (congestive) heart failure: Secondary | ICD-10-CM

## 2022-06-06 NOTE — Progress Notes (Signed)
Daily Session Note  Patient Details  Name: Robert Mccann MRN: [id number] Date of Birth: 02/07/1996 Referring Provider:   Flowsheet Row Cardiac Rehab from 04/19/2022 in [facility] Cardiac and Pulmonary Rehab  Referring Provider Glori Bickers MD       Encounter Date: 06/06/2022  Check In:  Session Check In - 06/06/22 1047       Check-In   Supervising physician immediately available to respond to emergencies See telemetry face sheet for immediately available ER MD    Location [facility] Re[facility]    Staff Present Carson Myrtle, BS, RRT, CPFT;Joseph Karie Fetch, MPA, RN    Virtual Visit No    Medication changes reported     No    Fall or balance concerns reported    No    Warm-up and Cool-down Performed on first and last piece of equipment    Resistance Training Performed Yes    VAD Patient? No    PAD/SET Patient? No      Pain Assessment   Currently in Pain? No/denies                Social History   Tobacco Use  Smoking Status Never  Smokeless Tobacco Never    Goals Met:  Independence with exercise equipment Exercise tolerated well No report of concerns or symptoms today Strength training completed today  Goals Unmet:  Not Applicable  Comments: Pt able to follow exercise prescription today without complaint.  Will continue to monitor for progression.    Dr. Emily Filbert is Medical Director for [place].  Dr. Ottie Glazier is Medical Director for [facility] Pulmonary Rehabilitation.

## 2022-06-07 LAB — JAK2 GENOTYPR

## 2022-06-08 ENCOUNTER — Encounter: Payer: 59 | Admitting: *Deleted

## 2022-06-08 DIAGNOSIS — I5022 Chronic systolic (congestive) heart failure: Secondary | ICD-10-CM

## 2022-06-08 NOTE — Progress Notes (Signed)
Daily Session Note  Patient Details  Name: Robert Mccann MRN: [id number] Date of Birth: 25-Jan-1996 Referring Provider:   Flowsheet Row Cardiac Rehab from 04/19/2022 in [facility] Cardiac and Pulmonary Rehab  Referring Provider Glori Bickers MD       Encounter Date: 06/08/2022  Check In:  Session Check In - 06/08/22 1144       Check-In   Supervising physician immediately available to respond to emergencies See telemetry face sheet for immediately available ER MD    Location [facility]Card[facility] & [facility]monary Rehab    Staff Present Heath Lark, RN, BSN, CCRP;Joseph Leary, [place]    Virtual Visit No    Medication changes reported     No    Fall or balance concerns reported    No    Warm-up and Cool-down Performed on first and last piece of equipment    Resistance Training Performed Yes    VAD Patient? No    PAD/SET Patient? No      Pain Assessment   Currently in Pain? No/denies                Social History   Tobacco Use  Smoking Status Never  Smokeless Tobacco Never    Goals Met:  Independence with exercise equipment Exercise tolerated well No report of concerns or symptoms today  Goals Unmet:  Not Applicable  Comments: Pt able to follow exercise prescription today without complaint.  Will continue to monitor for progression.    Dr. Emily Filbert is Medical Director for [place].  Dr. Ottie Glazier is Medical Director for [facility] Pulmonary Rehabilitation.

## 2022-06-11 ENCOUNTER — Encounter: Payer: 59 | Admitting: *Deleted

## 2022-06-11 DIAGNOSIS — I5022 Chronic systolic (congestive) heart failure: Secondary | ICD-10-CM

## 2022-06-11 NOTE — Progress Notes (Signed)
Daily Session Note  Patient Details  Name: Robert Mccann MRN: [id number] Date of Birth: Sep 30, 1996 Referring Provider:   Flowsheet Row Cardiac Rehab from 04/19/2022 in [facility] Cardiac and Pulmonary Rehab  Referring Provider Glori Bickers MD       Encounter Date: 06/11/2022  Check In:  Session Check In - 06/11/22 1127       Check-In   Supervising physician immediately available to respond to emergencies See telemetry face sheet for immediately available ER MD    Location [facility]-Cardiac & Pulmonary Rehab    Staff Present Heath Lark, RN, BSN, Laveda Norman, BS, ACSM CEP, Exercise Physiologist;Joseph Blasdell, [place]    Virtual Visit No    Medication changes reported     No    Fall or balance concerns reported    No    Warm-up and Cool-down Performed on first and last piece of equipment    Resistance Training Performed Yes    VAD Patient? No    PAD/SET Patient? No      Pain Assessment   Currently in Pain? No/denies                Social History   Tobacco Use  Smoking Status Never  Smokeless Tobacco Never    Goals Met:  Independence with exercise equipment Exercise tolerated well No report of concerns or symptoms today  Goals Unmet:  Not Applicable  Comments: Pt able to follow exercise prescription today without complaint.  Will continue to monitor for progression.    Dr. Emily Filbert is Medical Director for [place].  Dr. Ottie Glazier is Medical Director for [facility] Pulmonary Rehabilitation.

## 2022-06-12 ENCOUNTER — Other Ambulatory Visit: Payer: Self-pay | Admitting: *Deleted

## 2022-06-12 ENCOUNTER — Other Ambulatory Visit: Payer: 59

## 2022-06-12 DIAGNOSIS — E782 Mixed hyperlipidemia: Secondary | ICD-10-CM

## 2022-06-12 DIAGNOSIS — I5023 Acute on chronic systolic (congestive) heart failure: Secondary | ICD-10-CM

## 2022-06-12 LAB — LIPID PANEL
Chol/HDL Ratio: 2.6 ratio (ref 0.0–5.0)
Cholesterol, Total: 98 mg/dL — ABNORMAL LOW (ref 100–199)
HDL: 38 mg/dL — ABNORMAL LOW (ref >39)
LDL Chol Calc (NIH): 39 mg/dL (ref 0–99)
Triglycerides: 116 mg/dL (ref 0–149)
VLDL Cholesterol Cal: 21 mg/dL (ref 5–40)

## 2022-06-12 LAB — HEPATIC FUNCTION PANEL
ALT: 38 IU/L (ref 0–44)
AST: 54 IU/L — ABNORMAL HIGH (ref 0–40)
Albumin: 4.5 g/dL (ref 4.1–5.2)
Alkaline Phosphatase: 102 IU/L (ref 44–121)
Bilirubin Total: 0.5 mg/dL (ref 0.0–1.2)
Bilirubin, Direct: 0.18 mg/dL (ref 0.00–0.40)
Total Protein: 7.7 g/dL (ref 6.0–8.5)

## 2022-06-13 ENCOUNTER — Encounter: Payer: Self-pay | Admitting: *Deleted

## 2022-06-13 ENCOUNTER — Encounter: Payer: 59 | Admitting: *Deleted

## 2022-06-13 DIAGNOSIS — I5022 Chronic systolic (congestive) heart failure: Secondary | ICD-10-CM | POA: Diagnosis not present

## 2022-06-13 NOTE — Progress Notes (Signed)
Daily Session Note  Patient Details  Name: Robert Mccann MRN: [id number] Date of Birth: Jul 15, 1996 Referring Provider:   Flowsheet Row Cardiac Rehab from 04/19/2022 in [facility] Cardiac and Pulmonary Rehab  Referring Provider Glori Bickers MD       Encounter Date: 06/13/2022  Check In:  Session Check In - 06/13/22 1146       Check-In   Supervising physician immediately available to respond to emergencies See telemetry face sheet for immediately available ER MD    Location [facility]-Cardiac & Pulmonary Rehab    Staff Present Heath Lark, RN, BSN, CCRP;Laureen Owens Shark, BS, RRT, CPFT;Melissa Fairbank, RDN, LDN    Virtual Visit No    Medication changes reported     No    Fall or balance concerns reported    No    Warm-up and Cool-down Performed on first and last piece of equipment    Resistance Training Performed Yes    VAD Patient? No    PAD/SET Patient? No      Pain Assessment   Currently in Pain? No/denies                Social History   Tobacco Use  Smoking Status Never  Smokeless Tobacco Never    Goals Met:  Independence with exercise equipment Exercise tolerated well No report of concerns or symptoms today  Goals Unmet:  Not Applicable  Comments: Pt able to follow exercise prescription today without complaint.  Will continue to monitor for progression.    Dr. Emily Filbert is Medical Director for [place].  Dr. Ottie Glazier is Medical Director for [facility] Pulmonary Rehabilitation.

## 2022-06-13 NOTE — Progress Notes (Signed)
Cardiac Individual Treatment Plan  Patient Details  Name: Robert Mccann MRN: [id number] Date of Birth: 1996/03/11 Referring Provider:   Flowsheet Row Cardiac Rehab from 04/19/2022 in [facility] Cardiac and Pulmonary Rehab  Referring Provider Glori Bickers MD       Initial Encounter Date:  Flowsheet Row Cardiac Rehab from 04/19/2022 in [facility] Cardiac and Pulmonary Rehab  Date 04/19/22       Visit Diagnosis: Heart failure, chronic systolic ([place])  Patient's Home Medications on Admission:  Current Outpatient Medications:    AMBULATORY NON FORMULARY MEDICATION, Diltiazem 2% compounded with lidocaine 5% ointment applied to the affected area 3 times daily x 8 weeks, Disp: 1 each, Rfl: 1   apixaban (ELIQUIS) 5 MG TABS tablet, Take 1 tablet (5 mg total) by mouth 2 (two) times daily., Disp: 60 tablet, Rfl: 6   Bempedoic Acid-Ezetimibe (NEXLIZET) 180-10 MG TABS, Take 1 tablet by mouth daily., Disp: 90 tablet, Rfl: 3   BIOTIN PO, Take 1 capsule by mouth daily., Disp: , Rfl:    Blood Glucose Monitoring Suppl (BLOOD GLUCOSE MONITOR SYSTEM) w/Device KIT, Use up to four times daily as directed., Disp: 1 kit, Rfl: 3   carvedilol (COREG) 3.125 MG tablet, Take 3.125 mg by mouth 2 (two) times daily. (Patient not taking: Reported on 05/17/2022), Disp: , Rfl:    digoxin (LANOXIN) 0.125 MG tablet, Take 1 tablet (0.125 mg total) by mouth daily., Disp: 30 tablet, Rfl: 6   FARXIGA 10 MG TABS tablet, Take 10 mg by mouth daily., Disp: , Rfl:    furosemide (LASIX) 40 MG tablet, Take 2 tablets (80 mg total) by mouth daily., Disp: 60 tablet, Rfl: 6   metFORMIN (GLUCOPHAGE) 500 MG tablet, Take 1 tablet (500 mg total) by mouth 2 (two) times daily with a meal., Disp: 60 tablet, Rfl: 0   Multiple Vitamin (MULTIVITAMIN WITH MINERALS) TABS tablet, Take 1 tablet by mouth daily., Disp: , Rfl:    potassium chloride (KLOR-CON) 20 MEQ packet, Take 40 mEq by mouth daily. 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 05/2021 noted during periods of sleep @ time of hospitalization for PE.   Cardiomyopathy ([place])    a. 05/2021 Echo: EF <20%, glob HK. Bruni:C ratio of 2.1:1 suggesting non-compaction. Gr2 DD. Sev reduced RV fxn. Nl PASP. Mildly dil RA. Mild MR; b. 10/2021 Echo: EF <20%, GrII DD; c. 10/2021 TEE: EF 20-25%, glob HK, sev red RV fxn.   Diabetes mellitus without complication ([place])    HFrEF (heart failure with reduced ejection fraction) ([place])    a. 05/2021 Echo: EF <20%. ? non-compaction.   Hypertension    Pulmonary embolism ([place])    a. 05/2021 CTA Chest: PE extending from lobar arteris of RUL and RLL w/ concern for pulml infarct-->s/p thrombolysis and thrombectomy of RUL/RML/RLL.   Stroke [facility])    a. 10/2021 CT Head: Bilat cerebral  infarcts w/ mod-sided acute to subacute R temporoparietal infarct; b. 10/2021 MRI: R post PCA territory infarcts w/o hemorrhage; c. 10/2021 TEE: Neg bubble study. No LA/LAA thrombus. EF 20-25%, glob HK.    Tobacco Use: Social History   Tobacco Use  Smoking Status Never  Smokeless Tobacco Never    Labs: Review Flowsheet  More data exists      Latest Ref Rng & Units 03/19/2022 03/20/2022 03/21/2022 04/30/2022  Labs for ITP Cardiac and Pulmonary Rehab  Cholestrol 100 - 199 mg/dL - - - 154   LDL (calc) 0 - 99 mg/dL - - - 80   HDL-C >39 mg/dL - - - 40   Trlycerides 0 - 149 mg/dL - - - 199   O2 Saturation % 71.7  86.7  68.4  -      06/12/2022  Labs for ITP  Cardiac and Pulmonary Rehab  Cholestrol 98   LDL (calc) 39   HDL-C 38   Trlycerides 116   O2 Saturation -     Exercise Target Goals: Exercise Program Goal: Individual exercise prescription set using results from initial 6 min walk test and THRR while considering  patient's activity barriers and safety.   Exercise Prescription Goal: Initial exercise prescription builds to 30-45 minutes a day of aerobic activity, 2-3 days per week.  Home exercise guidelines will be given to patient during program as part of exercise prescription that the participant will acknowledge.   Education: Aerobic Exercise: - Group verbal and visual presentation on the components of exercise prescription. Introduces F.I.T.T principle from ACSM for exercise prescriptions.  Reviews F.I.T.T. principles of aerobic exercise including progression. Written material given at graduation. Flowsheet Row Cardiac Rehab from 06/13/2022 in [facility] Cardiac and Pulmonary Rehab  Education need identified 04/19/22  Date 05/02/22  Educator [place]  Instruction Review Code 1- [employer] Understanding       Education: Resistance Exercise: - Group verbal and visual presentation on the components of exercise prescription. Introduces F.I.T.T principle from ACSM for exercise prescriptions  Reviews F.I.T.T. principles of resistance exercise including progression. Written material given at graduation. Flowsheet Row Cardiac Rehab from 06/13/2022 in [facility] Cardiac and Pulmonary Rehab  Date 05/09/22  Educator [facility]  Instruction Review Code 1- Verbalizes Understanding        Education: Exercise & Equipment Safety: - Individual verbal instruction and demonstration of equipment use and safety with use of the equipment. Flowsheet Row Cardiac Rehab from 06/13/2022 in [facility] Cardiac and Pulmonary Rehab  Education need identified 04/19/22  Date 04/19/22  Educator [place]  Instruction Review Code 1- Verbalizes Understanding       Education: Exercise  Physiology & General Exercise Guidelines: - Group verbal and written instruction with models to review the exercise physiology of the cardiovascular system and associated critical values. Provides general exercise guidelines with specific guidelines to those with heart or lung disease.  Flowsheet Row Cardiac Rehab from 06/13/2022 in [facility] Cardiac and Pulmonary Rehab  Education need identified 04/19/22       Education: Flexibility, Balance, Mind/Body Relaxation: - Group verbal and visual presentation with interactive activity on the components of exercise prescription. Introduces F.I.T.T principle from ACSM for exercise prescriptions. Reviews F.I.T.T. principles of flexibility and balance exercise training including progression. Also discusses the mind body connection.  Reviews various relaxation techniques to help reduce and manage stress (i.e. Deep breathing, progressive muscle relaxation, and visualization). Balance handout provided to take home. Written material given at graduation. Flowsheet Row Cardiac Rehab from 06/13/2022 in [facility] Cardiac and  Pulmonary Rehab  Date 05/16/22  Educator [facility]  Instruction Review Code 1- Verbalizes Understanding       Activity Barriers & Risk Stratification:  Activity Barriers & Cardiac Risk Stratification - 04/19/22 1617       Activity Barriers & Cardiac Risk Stratification   Activity Barriers Decreased Ventricular Function;Deconditioning;Muscular Weakness    Comments calf muscle left leg (MD aware)    Cardiac Risk Stratification High             6 Minute Walk:  6 Minute Walk     Row Name 04/19/22 1617         6 Minute Walk   Phase Initial     Distance 1020 feet     Walk Time 6 minutes     # of Rest Breaks 0     MPH 1.93     METS 3.88     RPE 9     Perceived Dyspnea  0     VO2 Peak 13.6     Symptoms Yes (comment)     Comments Left calf pain 3/10     Resting HR 92 bpm     Resting BP 104/70     Resting Oxygen Saturation  98 %      Exercise Oxygen Saturation  during 6 min walk 98 %     Max Ex. HR 119 bpm     Max Ex. BP 132/74     2 Minute Post BP 108/78              Oxygen Initial Assessment:   Oxygen Re-Evaluation:   Oxygen Discharge (Final Oxygen Re-Evaluation):   Initial Exercise Prescription:  Initial Exercise Prescription - 04/19/22 1600       Date of Initial Exercise RX and Referring Provider   Date 04/19/22    Referring Provider Glori Bickers MD      Oxygen   Maintain Oxygen Saturation 88% or higher      NuStep   Level 1    SPM 80    Minutes 15    METs 3.8      REL-XR   Level 1    Speed 50    Minutes 15    METs 3.8      T5 Nustep   Level 1    SPM 80    Minutes 15    METs 3.8      Track   Laps 21   As tolerated - take breaks as needed   Minutes 15    METs 2.14      Prescription Details   Frequency (times per week) 3    Duration Progress to 30 minutes of continuous aerobic without signs/symptoms of physical distress      Intensity   THRR 40-80% of Max Heartrate 132 - 162    Ratings of Perceived Exertion 11-13    Perceived Dyspnea 0-4      Progression   Progression Continue to progress workloads to maintain intensity without signs/symptoms of physical distress.      Resistance Training   Training Prescription Yes    Weight 3 lb    Reps 10-15             Perform Capillary Blood Glucose checks as needed.  Exercise Prescription Changes:   Exercise Prescription Changes     Row Name 04/19/22 1600 04/24/22 1600 05/08/22 1500 05/14/22 1100 05/21/22 1600     Response to Exercise   Blood Pressure (Admit) 104/70 110/78 110/64 --  116/72   Blood Pressure (Exercise) 132/74 122/78 158/62 -- 124/82   Blood Pressure (Exit) 108/78 104/74 104/60 -- 106/64   Heart Rate (Admit) 92 bpm 93 bpm 103 bpm -- 96 bpm   Heart Rate (Exercise) 119 bpm 122 bpm 133 bpm -- 114 bpm   Heart Rate (Exit) 96 bpm 99 bpm 96 bpm -- 87 bpm   Oxygen Saturation (Admit) 98 % -- -- -- --    Oxygen Saturation (Exercise) 98 % -- -- -- --   Rating of Perceived Exertion (Exercise) 9 9 13  -- 11   Perceived Dyspnea (Exercise) 0 -- -- -- --   Symptoms Left calf pain 3/10 none none -- none   Comments walk test results first full day of exercise -- -- --   Duration -- Progress to 30 minutes of  aerobic without signs/symptoms of physical distress Continue with 30 min of aerobic exercise without signs/symptoms of physical distress. -- Continue with 30 min of aerobic exercise without signs/symptoms of physical distress.   Intensity -- THRR unchanged THRR unchanged -- THRR unchanged     Progression   Progression -- Continue to progress workloads to maintain intensity without signs/symptoms of physical distress. Continue to progress workloads to maintain intensity without signs/symptoms of physical distress. -- Continue to progress workloads to maintain intensity without signs/symptoms of physical distress.   Average METs -- 2.07 2.5 -- 2.66     Resistance Training   Training Prescription -- Yes Yes -- Yes   Weight -- 3 lb 3 lb -- 7 lb   Reps -- 10-15 10-15 -- 10-15     Interval Training   Interval Training -- No No -- No     Treadmill   MPH -- -- 1.5 -- 1.7   Grade -- -- 2.5 -- 2.5   Minutes -- -- 15 -- 15   METs -- -- 2.67 -- 2.89     NuStep   Level -- 1 3 -- 4   Minutes -- 15 15 -- 15   METs -- 2 2.5 -- 3.5     Recumbant Elliptical   Level -- -- 2 -- 3   Minutes -- -- 15 -- 15   METs -- -- -- -- 1.6     Track   Laps -- 21 21 -- --   Minutes -- 15 15 -- --   METs -- 2.14 2.14 -- --     Home Exercise Plan   Plans to continue exercise at -- -- -- Home (comment)  walking and bike Home (comment)  walking and bike   Frequency -- -- -- Add 2 additional days to program exercise sessions. Add 2 additional days to program exercise sessions.   Initial Home Exercises Provided -- -- -- 05/14/22 05/14/22     Oxygen   Maintain Oxygen Saturation -- 88% or higher 88% or higher -- 88%  or higher    Row Name 06/04/22 1000             Response to Exercise   Blood Pressure (Admit) 104/62       Blood Pressure (Exercise) 134/60       Blood Pressure (Exit) 100/56       Heart Rate (Admit) 86 bpm       Heart Rate (Exercise) 145 bpm       Heart Rate (Exit) 108 bpm       Rating of Perceived Exertion (Exercise) 13       Symptoms none  Duration Continue with 30 min of aerobic exercise without signs/symptoms of physical distress.       Intensity THRR unchanged         Progression   Progression Continue to progress workloads to maintain intensity without signs/symptoms of physical distress.       Average METs 3.74         Resistance Training   Training Prescription Yes       Weight 7 lb       Reps 10-15         Interval Training   Interval Training Yes       Equipment Treadmill       Comments up to 4% incline         Treadmill   MPH 1.7       Grade 4  intervals       Minutes 15       METs 3.24         NuStep   Level 5       Minutes 15       METs 4.1         Home Exercise Plan   Plans to continue exercise at Home (comment)  walking and bike       Frequency Add 2 additional days to program exercise sessions.       Initial Home Exercises Provided 05/14/22         Oxygen   Maintain Oxygen Saturation 88% or higher                Exercise Comments:   Exercise Goals and Review:   Exercise Goals     Row Name 04/19/22 1628             Exercise Goals   Increase Physical Activity Yes       Intervention Provide advice, education, support and counseling about physical activity/exercise needs.;Develop an individualized exercise prescription for aerobic and resistive training based on initial evaluation findings, risk stratification, comorbidities and participant's personal goals.       Expected Outcomes Short Term: Attend rehab on a regular basis to increase amount of physical activity.;Long Term: Add in home exercise to make exercise part of  routine and to increase amount of physical activity.;Long Term: Exercising regularly at least 3-5 days a week.       Increase Strength and Stamina Yes       Intervention Provide advice, education, support and counseling about physical activity/exercise needs.;Develop an individualized exercise prescription for aerobic and resistive training based on initial evaluation findings, risk stratification, comorbidities and participant's personal goals.       Expected Outcomes Short Term: Increase workloads from initial exercise prescription for resistance, speed, and METs.;Short Term: Perform resistance training exercises routinely during rehab and add in resistance training at home;Long Term: Improve cardiorespiratory fitness, muscular endurance and strength as measured by increased METs and functional capacity (6MWT)       Able to understand and use rate of perceived exertion (RPE) scale Yes       Intervention Provide education and explanation on how to use RPE scale       Expected Outcomes Short Term: Able to use RPE daily in rehab to express subjective intensity level;Long Term:  Able to use RPE to guide intensity level when exercising independently       Able to understand and use Dyspnea scale Yes       Intervention Provide education and explanation on how to use  Dyspnea scale       Expected Outcomes Short Term: Able to use Dyspnea scale daily in rehab to express subjective sense of shortness of breath during exertion;Long Term: Able to use Dyspnea scale to guide intensity level when exercising independently       Knowledge and understanding of Target Heart Rate Range (THRR) Yes       Intervention Provide education and explanation of THRR including how the numbers were predicted and where they are located for reference       Expected Outcomes Short Term: Able to state/look up THRR;Long Term: Able to use THRR to govern intensity when exercising independently;Short Term: Able to use daily as guideline for  intensity in rehab       Able to check pulse independently Yes       Intervention Provide education and demonstration on how to check pulse in carotid and radial arteries.;Review the importance of being able to check your own pulse for safety during independent exercise       Expected Outcomes Short Term: Able to explain why pulse checking is important during independent exercise;Long Term: Able to check pulse independently and accurately       Understanding of Exercise Prescription Yes       Intervention Provide education, explanation, and written materials on patient's individual exercise prescription       Expected Outcomes Short Term: Able to explain program exercise prescription;Long Term: Able to explain home exercise prescription to exercise independently                Exercise Goals Re-Evaluation :  Exercise Goals Re-Evaluation     Row Name 04/23/22 1114 05/08/22 1607 05/14/22 1120 05/21/22 1612 06/04/22 1041     Exercise Goal Re-Evaluation   Exercise Goals Review Increase Physical Activity;Able to understand and use rate of perceived exertion (RPE) scale;Knowledge and understanding of Target Heart Rate Range (THRR);Understanding of Exercise Prescription;Increase Strength and Stamina;Able to check pulse independently Increase Physical Activity;Understanding of Exercise Prescription;Increase Strength and Stamina Increase Physical Activity;Understanding of Exercise Prescription;Increase Strength and Stamina;Able to understand and use rate of perceived exertion (RPE) scale;Able to understand and use Dyspnea scale;Knowledge and understanding of Target Heart Rate Range (THRR);Able to check pulse independently Increase Physical Activity;Increase Strength and Stamina;Understanding of Exercise Prescription Increase Physical Activity;Increase Strength and Stamina;Understanding of Exercise Prescription   Comments Reviewed RPE and dyspnea scales, THR and program prescription with pt today.  Pt  voiced understanding and was given a copy of goals to take home. Lionardo has been doing well during his first few weeks in rehab. He improved his average METs to 2.5. He increased to Level 3 on the T4NS machine. He has begun to hit his THR while performing exercise. We advised pt to start with lower RPEs, he will now increase RPEs to normal level. We will continue to monitor pt as he progresses in the rehab program. Reviewed home exercise with pt today.  Pt plans to walk and use stationary bike at home for exercise.  Reviewed THR, pulse, RPE, sign and symptoms, pulse oximetery and when to call 911 or MD.  Also discussed weather considerations and indoor options.  Pt voiced understanding. Flynn is doing well in rehab.  We have increased his RPE up to normal levels as he has not had symptoms in rehab. His weight has been on a downward trend as well.  He is up to level 3 on the recumbent elliptical.  We will continue to monitor his progress. Valerie Salts  continues to do well in rehab. He has been out the last week but was still able to improve on the T4 Nustep to level 5. He also started intervals on the treadmill working up to 4% incline.  He is also now up to 7 lbs for handweights. Will continue to monitor.   Expected Outcomes Short: Use RPE daily to regulate intensity. Long: Follow program prescription in THR. Short: increase RPEs during exercise. Long: Follow program to improve strength and stamina. Short: Start to add in exercise at home Long: Conitnue to exercise independently short: Try 2 mph on treadmill Long: continue to improve stamina and work on weight loss Short: Continue to increase workloads on treadmill Long: Continue to increase overall MET level            Discharge Exercise Prescription (Final Exercise Prescription Changes):  Exercise Prescription Changes - 06/04/22 1000       Response to Exercise   Blood Pressure (Admit) 104/62    Blood Pressure (Exercise) 134/60    Blood Pressure (Exit)  100/56    Heart Rate (Admit) 86 bpm    Heart Rate (Exercise) 145 bpm    Heart Rate (Exit) 108 bpm    Rating of Perceived Exertion (Exercise) 13    Symptoms none    Duration Continue with 30 min of aerobic exercise without signs/symptoms of physical distress.    Intensity THRR unchanged      Progression   Progression Continue to progress workloads to maintain intensity without signs/symptoms of physical distress.    Average METs 3.74      Resistance Training   Training Prescription Yes    Weight 7 lb    Reps 10-15      Interval Training   Interval Training Yes    Equipment Treadmill    Comments up to 4% incline      Treadmill   MPH 1.7    Grade 4   intervals   Minutes 15    METs 3.24      NuStep   Level 5    Minutes 15    METs 4.1      Home Exercise Plan   Plans to continue exercise at Home (comment)   walking and bike   Frequency Add 2 additional days to program exercise sessions.    Initial Home Exercises Provided 05/14/22      Oxygen   Maintain Oxygen Saturation 88% or higher             Nutrition:  Target Goals: Understanding of nutrition guidelines, daily intake of sodium <1539m, cholesterol <207m calories 30% from fat and 7% or less from saturated fats, daily to have 5 or more servings of fruits and vegetables.  Education: All About Nutrition: -Group instruction provided by verbal, written material, interactive activities, discussions, models, and posters to present general guidelines for heart healthy nutrition including fat, fiber, MyPlate, the role of sodium in heart healthy nutrition, utilization of the nutrition label, and utilization of this knowledge for meal planning. Follow up email sent as well. Written material given at graduation. [facility]  Date 05/23/22  [facility] 1- Verbalizes Understanding       Biometrics:  Pre Biometrics - 04/19/22  1616       Pre Biometrics   Height 5' 11"  (1.803 m)    Weight 321 lb 3.2 oz (145.7 kg)    BMI (Calculated) 44.82  Nutrition Therapy Plan and Nutrition Goals:  Nutrition Therapy & Goals - 04/19/22 1402       Intervention Plan   Intervention Prescribe, educate and counsel regarding individualized specific dietary modifications aiming towards targeted core components such as weight, hypertension, lipid management, diabetes, heart failure and other comorbidities.    Expected Outcomes Short Term Goal: Understand basic principles of dietary content, such as calories, fat, sodium, cholesterol and nutrients.;Short Term Goal: A plan has been developed with personal nutrition goals set during dietitian appointment.;Long Term Goal: Adherence to prescribed nutrition plan.             Nutrition Assessments:  MEDIFICTS Score Key: ?70 Need to make dietary changes  40-70 Heart Healthy Diet ? 40 Therapeutic Level Cholesterol Diet  Flowsheet Row Cardiac Rehab from 04/19/2022 in [facility] Cardiac and Pulmonary Rehab  Picture Your Plate Total Score on Admission 56      Picture Your Plate Scores: <47 Unhealthy dietary pattern with much room for improvement. 41-50 Dietary pattern unlikely to meet recommendations for good health and room for improvement. 51-60 More healthful dietary pattern, with some room for improvement.  >60 Healthy dietary pattern, although there may be some specific behaviors that could be improved.    Nutrition Goals Re-Evaluation:  Nutrition Goals Re-Evaluation     [place] Name 05/14/22 1140 05/21/22 1149           Goals   Current Weight 319 lb (144.7 kg) --      Nutrition Goal Lose more weight., Cut back on fruit juice. --      Comment Dagmawi states that he loves juice. Informed him that it has alot of sugar and extra calories. He understands that he may be able to lose some weight with cutting out some of his juice intake. Gave him some guidance on other  drinks to consider. Omarie rescheduled nutrition appt. for 06/18/22.      Expected Outcome Short: cut back on sugary juices. Long: adhere to a diet that pertains to him. --               Nutrition Goals Discharge (Final Nutrition Goals Re-Evaluation):  Nutrition Goals Re-Evaluation - 05/21/22 1149       Goals   Comment Faolan rescheduled nutrition appt. for 06/18/22.             Psychosocial: Target Goals: Acknowledge presence or absence of significant depression and/or stress, maximize coping skills, provide positive support system. Participant is able to verbalize types and ability to use techniques and skills needed for reducing stress and depression.   Education: Stress, Anxiety, and Depression - Group verbal and visual presentation to define topics covered.  Reviews how body is impacted by stress, anxiety, and depression.  Also discusses healthy ways to reduce stress and to treat/manage anxiety and depression.  Written material given at graduation.   Education: Sleep Hygiene -Provides group verbal and written instruction about how sleep can affect your health.  Define sleep hygiene, discuss sleep cycles and impact of sleep habits. Review good sleep hygiene tips.    Initial Review & Psychosocial Screening:  Initial Psych Review & Screening - 04/16/22 1412       Initial Review   Current issues with None Identified      Family Dynamics   Good Support System? Yes   parents and girlfriend     Barriers   Psychosocial barriers to participate in program There are no identifiable barriers or psychosocial needs.;The patient should benefit from training  in stress management and relaxation.      Screening Interventions   Interventions Encouraged to exercise;Provide feedback about the scores to participant;To provide support and resources with identified psychosocial needs    Expected Outcomes Short Term goal: Utilizing psychosocial counselor, staff and physician to assist with  identification of specific Stressors or current issues interfering with healing process. Setting desired goal for each stressor or current issue identified.;Long Term Goal: Stressors or current issues are controlled or eliminated.;Short Term goal: Identification and review with participant of any Quality of Life or Depression concerns found by scoring the questionnaire.;Long Term goal: The participant improves quality of Life and PHQ9 Scores as seen by post scores and/or verbalization of changes             Quality of Life Scores:   Quality of Life - 04/19/22 1401       Quality of Life   Select Quality of Life      Quality of Life Scores   Health/Function Pre 23.3 %    Socioeconomic Pre 25 %    Psych/Spiritual Pre 26.79 %    Family Pre 18.5 %    GLOBAL Pre 23.7 %            Scores of 19 and below usually indicate a poorer quality of life in these areas.  A difference of  2-3 points is a clinically meaningful difference.  A difference of 2-3 points in the total score of the Quality of Life Index has been associated with significant improvement in overall quality of life, self-image, physical symptoms, and general health in studies assessing change in quality of life.  PHQ-9: Review Flowsheet       04/19/2022 01/05/2022 07/11/2021  Depression screen PHQ 2/9  Decreased Interest 0 0 0  Down, Depressed, Hopeless 0 0 0  PHQ - 2 Score 0 0 0  Altered sleeping 0 - -  Tired, decreased energy 1 - -  Change in appetite 1 - -  Feeling bad or failure about yourself  0 - -  Trouble concentrating 0 - -  Moving slowly or fidgety/restless 1 - -  Suicidal thoughts 0 - -  PHQ-9 Score 3 - -  Difficult doing work/chores Not difficult at all - -   Interpretation of Total Score  Total Score Depression Severity:  1-4 = Minimal depression, 5-9 = Mild depression, 10-14 = Moderate depression, 15-19 = Moderately severe depression, 20-27 = Severe depression   Psychosocial Evaluation and  Intervention:  Psychosocial Evaluation - 04/16/22 1417       Psychosocial Evaluation & Interventions   Interventions Encouraged to exercise with the program and follow exercise prescription    Comments Mr. Mcclimans is coming to cardiac rehab with the diagnosis of systolic heart failure. He mentioned a possible heart transplant in the future, so he wants to work on getting his weight down to at least 260lb and boost his stamina. He states his parents and his girlfriend are very supportive. 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 He reports he feels like he is handling all these medical issues okay and just takes it day by day.    Expected Outcomes Short: attend cardiac rehab for education and exercise. Long; develop and maintain positive self care habits.    Continue Psychosocial Services  Follow up required by staff             Psychosocial Re-Evaluation:  Psychosocial Re-Evaluation     Row Name 05/14/22 1134 05/25/22 1125           Psychosocial Re-Evaluation   Current issues with None Identified None Identified      Comments Jarae is a happy, hungry and tired. He loves comedy and he strives to be positive. He loves to play video games with his child and is very happy doing so. Larone is determined to get a heart transplant.Marland Kitchen He is staying positive and is ready to continue exercise. Other than his motivation getting more positive he has no mental health changes.      Expected Outcomes Short: Continue to exercise regularly to support mental health and notify staff of any changes. Long: maintain mental health and well being through teaching of rehab or prescribed medications independently. Short: maintain a positive attitude throughout heartTrack. Long: continue to keep a positive outlook on his mental health.      Interventions Encouraged to attend Cardiac Rehabilitation for the exercise Encouraged to attend Cardiac Rehabilitation for the  exercise      Continue Psychosocial Services  Follow up required by staff Follow up required by staff               Psychosocial Discharge (Final Psychosocial Re-Evaluation):  Psychosocial Re-Evaluation - 05/25/22 1125       Psychosocial Re-Evaluation   Current issues with None Identified    Comments Pradyun is determined to get a heart transplant.Marland Kitchen He is staying positive and is ready to continue exercise. Other than his motivation getting more positive he has no mental health changes.    Expected Outcomes Short: maintain a positive attitude throughout heartTrack. Long: continue to keep a positive outlook on his mental health.    Interventions Encouraged to attend Cardiac Rehabilitation for the exercise    Continue Psychosocial Services  Follow up required by staff             Vocational Rehabilitation: Provide vocational rehab assistance to qualifying candidates.   Vocational Rehab Evaluation & Intervention:  Vocational Rehab - 04/16/22 1410       Initial Vocational Rehab Evaluation & Intervention   Assessment shows need for Vocational Rehabilitation No             Education: Education Goals: Education classes will be provided on a variety of topics geared toward better understanding of heart health and risk factor modification. Participant will state understanding/return demonstration of topics presented as noted by education test scores.  Learning Barriers/Preferences:  Learning Barriers/Preferences - 04/16/22 1410       Learning Barriers/Preferences   Learning Barriers Sight   left eye issues after stroke   Learning Preferences None             General Cardiac Education Topics:  AED/CPR: - Group verbal and written instruction with the use of models to demonstrate the basic use of the AED with the basic ABC's of resuscitation.   Anatomy and Cardiac Procedures: - Group verbal and visual presentation and models provide information about basic cardiac  anatomy and function. Reviews the testing methods done to diagnose heart disease and the outcomes of the test results. Describes the treatment choices: Medical Management, Angioplasty, or Coronary Bypass Surgery for treating various heart conditions including Myocardial Infarction, Angina, Valve Disease, and Cardiac Arrhythmias.  Written material given at graduation. Flowsheet Row Cardiac Rehab from 06/13/2022 in [facility] Cardiac and Pulmonary Rehab  Education need identified 04/19/22  Date 05/09/22  Educator SB  Instruction Review Code 1-  Verbalizes Understanding       Medication Safety: - Group verbal and visual instruction to review commonly prescribed medications for heart and lung disease. Reviews the medication, class of the drug, and side effects. Includes the steps to properly store meds and maintain the prescription regimen.  Written material given at graduation.   Intimacy: - Group verbal instruction through game format to discuss how heart and lung disease can affect sexual intimacy. Written material given at graduation.. Flowsheet Row Cardiac Rehab from 06/13/2022 in [facility] Cardiac and Pulmonary Rehab  Date 05/02/22  Educator [facility]  Instruction Review Code 1- Verbalizes Understanding       Know Your Numbers and Heart Failure: - Group verbal and visual instruction to discuss disease risk factors for cardiac and pulmonary disease and treatment options.  Reviews associated critical values for Overweight/Obesity, Hypertension, Cholesterol, and Diabetes.  Discusses basics of heart failure: signs/symptoms and treatments.  Introduces Heart Failure Zone chart for action plan for heart failure.  Written material given at graduation. Flowsheet Row Cardiac Rehab from 06/13/2022 in [facility] Cardiac and Pulmonary Rehab  Date 06/06/22  Educator SB  Instruction Review Code 1- Verbalizes Understanding       Infection Prevention: - Provides verbal and written material to individual with discussion of  infection control including proper hand washing and proper equipment cleaning during exercise session. Flowsheet Row Cardiac Rehab from 06/13/2022 in [facility] Cardiac and Pulmonary Rehab  Education need identified 04/19/22  Date 04/19/22  Educator [place]  Instruction Review Code 1- Verbalizes Understanding       Falls Prevention: - Provides verbal and written material to individual with discussion of falls prevention and safety. Flowsheet Row Cardiac Rehab from 06/13/2022 in [facility] Cardiac and Pulmonary Rehab  Education need identified 04/19/22  Date 04/19/22  Educator [place]  Instruction Review Code 1- Verbalizes Understanding       Other: -Provides group and verbal instruction on various topics (see comments)   Knowledge Questionnaire Score:  Knowledge Questionnaire Score - 04/19/22 1400       Knowledge Questionnaire Score   Pre Score 22/26: Exercise, MI, Angina             Core Components/Risk Factors/Patient Goals at Admission:  Personal Goals and Risk Factors at Admission - 04/19/22 1630       Core Components/Risk Factors/Patient Goals on Admission    Weight Management Yes;Weight Loss;Obesity    Intervention Weight Management: Develop a combined nutrition and exercise program designed to reach desired caloric intake, while maintaining appropriate intake of nutrient and fiber, sodium and fats, and appropriate energy expenditure required for the weight goal.;Weight Management: Provide education and appropriate resources to help participant work on and attain dietary goals.;Weight Management/Obesity: Establish reasonable short term and long term weight goals.;Obesity: Provide education and appropriate resources to help participant work on and attain dietary goals.    Admit Weight 321 lb (145.6 kg)    Goal Weight: Short Term 315 lb (142.9 kg)    Goal Weight: Long Term 260 lb (117.9 kg)    Expected Outcomes Long Term: Adherence to nutrition and physical activity/exercise program aimed  toward attainment of established weight goal;Short Term: Continue to assess and modify interventions until short term weight is achieved;Weight Loss: Understanding of general recommendations for a balanced deficit meal plan, which promotes 1-2 lb weight loss per week and includes a negative energy balance of [phone] kcal/d;Understanding recommendations for meals to include 15-35% energy as protein, 25-35% energy from fat, 35-60% energy from carbohydrates, less than  240m of dietary cholesterol, 20-35 gm of total fiber daily;Understanding of distribution of calorie intake throughout the day with the consumption of 4-5 meals/snacks    Diabetes Yes    Intervention Provide education about signs/symptoms and action to take for hypo/hyperglycemia.;Provide education about proper nutrition, including hydration, and aerobic/resistive exercise prescription along with prescribed medications to achieve blood glucose in normal ranges: Fasting glucose 65-99 mg/dL    Expected Outcomes Short Term: Participant verbalizes understanding of the signs/symptoms and immediate care of hyper/hypoglycemia, proper foot care and importance of medication, aerobic/resistive exercise and nutrition plan for blood glucose control.;Long Term: Attainment of HbA1C < 7%.    Heart Failure Yes    Intervention Provide a combined exercise and nutrition program that is supplemented with education, support and counseling about heart failure. Directed toward relieving symptoms such as shortness of breath, decreased exercise tolerance, and extremity edema.    Expected Outcomes Improve functional capacity of life;Short term: Attendance in program 2-3 days a week with increased exercise capacity. Reported lower sodium intake. Reported increased fruit and vegetable intake. Reports medication compliance.;Short term: Daily weights obtained and reported for increase. Utilizing diuretic protocols set by physician.;Long term: Adoption of self-care skills and  reduction of barriers for early signs and symptoms recognition and intervention leading to self-care maintenance.    Hypertension Yes    Intervention Provide education on lifestyle modifcations including regular physical activity/exercise, weight management, moderate sodium restriction and increased consumption of fresh fruit, vegetables, and low fat dairy, alcohol moderation, and smoking cessation.;Monitor prescription use compliance.    Expected Outcomes Short Term: Continued assessment and intervention until BP is < 140/942mHG in hypertensive participants. < 130/8020mG in hypertensive participants with diabetes, heart failure or chronic kidney disease.;Long Term: Maintenance of blood pressure at goal levels.    Lipids Yes    Intervention Provide education and support for participant on nutrition & aerobic/resistive exercise along with prescribed medications to achieve LDL <62m26mDL >40mg40m Expected Outcomes Short Term: Participant states understanding of desired cholesterol values and is compliant with medications prescribed. Participant is following exercise prescription and nutrition guidelines.;Long Term: Cholesterol controlled with medications as prescribed, with individualized exercise RX and with personalized nutrition plan. Value goals: LDL < 62mg,1m > 40 mg.             Education:Diabetes - Individual verbal and written instruction to review signs/symptoms of diabetes, desired ranges of glucose level fasting, after meals and with exercise. Acknowledge that pre and post exercise glucose checks will be done for 3 sessions at entry of program. [facility]  Date 04/16/22  Educator [facility]  [facility] Code 1- Verbalizes Understanding       Core Components/Risk Factors/Patient Goals Review:   Goals and Risk Factor Review     Row Name 05/14/22 1124 05/25/22 1122           Core Components/Risk  Factors/Patient Goals Review   Personal Goals Review Weight Management/Obesity;Hypertension Weight Management/Obesity      Review Jayshun Rudile to check his blood pressure at home and has been good in the program. He checks his blood pressure at home routinely. He would like to lose more weight and gain more strength. Ledger Hernandoost some more weight and wants to reach a weight goal of 240 pounds. He needs to be 260 pounds to get a new heart. He is determined to get his weight off. He has lost  a few pounds since he last visited.      Expected Outcomes Short: lose 5 pounds in a couple weeks. Long: reach weight goal. Short: lose more weight. Long: reach weight goal.               Core Components/Risk Factors/Patient Goals at Discharge (Final Review):   Goals and Risk Factor Review - 05/25/22 1122       Core Components/Risk Factors/Patient Goals Review   Personal Goals Review Weight Management/Obesity    Review [place] has lost some more weight and wants to reach a weight goal of 240 pounds. He needs to be 260 pounds to get a new heart. He is determined to get his weight off. He has lost a few pounds since he last visited.    Expected Outcomes Short: lose more weight. Long: reach weight goal.             ITP Comments:  ITP Comments     Row Name 04/16/22 1420 04/19/22 1343 04/23/22 1113 05/16/22 0836 06/13/22 1233   ITP Comments Initial telephone orientation completed. Diagnosis can be found in [facility] 3/30. EP orientation scheduled for Thursday 4/30 at 9:30. Completed 6MWT and gym orientation. Initial ITP created and sent for review to Dr. Emily Filbert, Medical Director. First full day of exercise!  Patient was oriented to gym and equipment including functions, settings, policies, and procedures.  Patient's individual exercise prescription and treatment plan were reviewed.  All starting workloads were established based on the results of the 6 minute walk test done at initial orientation visit.   The plan for exercise progression was also introduced and progression will be customized based on patient's performance and goals. 30 Day review completed. Medical Director ITP review done, changes made as directed, and signed approval by Medical Director.    NEW 30 Day review completed. Medical Director ITP review done, changes made as directed, and signed approval by Medical Director.            Comments:

## 2022-06-15 ENCOUNTER — Encounter: Payer: 59 | Admitting: *Deleted

## 2022-06-15 ENCOUNTER — Ambulatory Visit: Payer: 59 | Admitting: Internal Medicine

## 2022-06-15 ENCOUNTER — Telehealth: Payer: Self-pay | Admitting: Internal Medicine

## 2022-06-15 ENCOUNTER — Inpatient Hospital Stay: Payer: 59 | Attending: Internal Medicine | Admitting: Internal Medicine

## 2022-06-15 DIAGNOSIS — I5022 Chronic systolic (congestive) heart failure: Secondary | ICD-10-CM | POA: Diagnosis not present

## 2022-06-15 NOTE — Progress Notes (Signed)
Daily Session Note  Patient Details  Name: Robert Mccann MRN: [id number] Date of Birth: [birth year] Referring Provider:   Flowsheet Row Cardiac Rehab from 04/19/2022 in [facility] Cardiac and Pulmonary Rehab  Referring Provider Glori Bickers MD       Encounter Date: 06/15/2022  Check In:  Session Check In - 06/15/22 1150       Check-In   Supervising physician immediately available to respond to emergencies See telemetry face sheet for immediately available ER MD    Location [facility]-Cardiac & Pulmonary Rehab    Staff Present Alberteen Sam, MA, RCEP, CCRP, CCET;Joseph [place], [place];Heath Lark, RN, BSN, CCRP    Virtual Visit No    Medication changes reported     No    Fall or balance concerns reported    No    Warm-up and Cool-down Performed on first and last piece of equipment    Resistance Training Performed Yes    VAD Patient? No    PAD/SET Patient? No      Pain Assessment   Currently in Pain? No/denies                Social History   Tobacco Use  Smoking Status Never  Smokeless Tobacco Never    Goals Met:  Independence with exercise equipment Exercise tolerated well No report of concerns or symptoms today  Goals Unmet:  Not Applicable  Comments: Pt able to follow exercise prescription today without complaint.  Will continue to monitor for progression.    Dr. Emily Filbert is Medical Director for [place].  Dr. Ottie Glazier is Medical Director for [facility] Pulmonary Rehabilitation.

## 2022-06-15 NOTE — Telephone Encounter (Signed)
Called to reschedule. Left vm

## 2022-06-18 ENCOUNTER — Encounter: Payer: 59 | Admitting: *Deleted

## 2022-06-18 DIAGNOSIS — I5022 Chronic systolic (congestive) heart failure: Secondary | ICD-10-CM

## 2022-06-18 NOTE — Progress Notes (Signed)
Completed initial RD consultation ?

## 2022-06-18 NOTE — Progress Notes (Signed)
Daily Session Note  Patient Details  Name: Robert Mccann MRN: [id number] Date of Birth: July 20, 1996 Referring Provider:   Flowsheet Row Cardiac Rehab from 04/19/2022 in [facility] Cardiac and Pulmonary Rehab  Referring Provider Glori Bickers MD       Encounter Date: 06/18/2022  Check In:  Session Check In - 06/18/22 1125       Check-In   Supervising physician immediately available to respond to emergencies See telemetry face sheet for immediately available ER MD    Location [facility]-Cardiac & Pulmonary Rehab    Staff Present Heath Lark, RN, BSN, CCRP;Kelly Bollinger, MPA, [place]icia [place], BS, ACSM CEP, Exercise Physiologist    Virtual Visit No    Medication changes reported     No    Fall or balance concerns reported    No    Warm-up and Cool-down Performed on first and last piece of equipment    Resistance Training Performed Yes    VAD Patient? No    PAD/SET Patient? No      Pain Assessment   Currently in Pain? No/denies                Social History   Tobacco Use  Smoking Status Never  Smokeless Tobacco Never    Goals Met:  Independence with exercise equipment Exercise tolerated well No report of concerns or symptoms today  Goals Unmet:  Not Applicable  Comments: Pt able to follow exercise prescription today without complaint.  Will continue to monitor for progression.    Dr. Emily Filbert is Medical Director for [place].  Dr. Ottie Glazier is Medical Director for [facility] Pulmonary Rehabilitation.

## 2022-06-20 DIAGNOSIS — I5022 Chronic systolic (congestive) heart failure: Secondary | ICD-10-CM | POA: Diagnosis not present

## 2022-06-20 NOTE — Progress Notes (Signed)
Daily Session Note  Patient Details  Name: Robert Mccann MRN: [id number] Date of Birth: [birth year] Referring Provider:   Flowsheet Row Cardiac Rehab from 04/19/2022 in [facility] Cardiac and Pulmonary Rehab  Referring Provider Glori Bickers MD       Encounter Date: 06/20/2022  Check In:  Session Check In - 06/20/22 1035       Check-In   Supervising physician immediately available to respond to emergencies See telemetry face sheet for immediately available ER MD    Location [facility]-Cardiac & Pulmonary Rehab    Staff Present Carson Myrtle, BS, RRT, CPFT;Kristen Coble, RN,BC,MSN;Kiree Dejarnette, MPA, RN;Joseph Tessie Fass, RCP,RRT,BSRT    Virtual Visit No    Medication changes reported     No    Fall or balance concerns reported    No    Warm-up and Cool-down Performed on first and last piece of equipment    Resistance Training Performed Yes    VAD Patient? No    PAD/SET Patient? No      Pain Assessment   Currently in Pain? No/denies                Social History   Tobacco Use  Smoking Status Never  Smokeless Tobacco Never    Goals Met:  Independence with exercise equipment Exercise tolerated well No report of concerns or symptoms today Strength training completed today  Goals Unmet:  Not Applicable  Comments: Pt able to follow exercise prescription today without complaint.  Will continue to monitor for progression.    Dr. Emily Filbert is Medical Director for [place].  Dr. Ottie Glazier is Medical Director for [facility] Pulmonary Rehabilitation.

## 2022-06-21 ENCOUNTER — Encounter: Payer: Self-pay | Admitting: Cardiology

## 2022-06-21 ENCOUNTER — Ambulatory Visit (INDEPENDENT_AMBULATORY_CARE_PROVIDER_SITE_OTHER): Payer: 59 | Admitting: Cardiology

## 2022-06-21 VITALS — BP 100/74 | HR 84 | Ht 71.0 in | Wt 319.0 lb

## 2022-06-21 DIAGNOSIS — I513 Intracardiac thrombosis, not elsewhere classified: Secondary | ICD-10-CM | POA: Diagnosis not present

## 2022-06-21 DIAGNOSIS — I5022 Chronic systolic (congestive) heart failure: Secondary | ICD-10-CM | POA: Diagnosis not present

## 2022-06-21 DIAGNOSIS — I2699 Other pulmonary embolism without acute cor pulmonale: Secondary | ICD-10-CM | POA: Diagnosis not present

## 2022-06-21 DIAGNOSIS — I10 Essential (primary) hypertension: Secondary | ICD-10-CM | POA: Diagnosis not present

## 2022-06-21 NOTE — Patient Instructions (Signed)
Medication Instructions:  Continue taking furosemide 20 mg-3 tablets by mouth daily  *If you need a refill on your cardiac medications before your next appointment, please call your pharmacy*   Lab Work: None ordered  If you have labs (blood work) drawn today and your tests are completely normal, you will receive your results only by: MyChart Message (if you have MyChart) OR A paper copy in the mail If you have any lab test that is abnormal or we need to change your treatment, we will call you to review the results.   Testing/Procedures: Your physician has requested that you have an echocardiogram in 2 months. Echocardiography is a painless test that uses sound waves to create images of your heart. It provides your doctor with information about the size and shape of your heart and how well your heart's chambers and valves are working. This procedure takes approximately one hour. There are no restrictions for this procedure.   Follow-Up: At [facility], you and your health needs are our priority.  As part of our continuing mission to provide you with exceptional heart care, we have created designated Provider Care Teams.  These Care Teams include your primary Cardiologist (physician) and Advanced Practice Providers (APPs -  Physician Assistants and Nurse Practitioners) who all work together to provide you with the care you need, when you need it.  We recommend signing up for the patient portal called "MyChart".  Sign up information is provided on this After Visit Summary.  MyChart is used to connect with patients for Virtual Visits (Telemedicine).  Patients are able to view lab/test results, encounter notes, upcoming appointments, etc.  Non-urgent messages can be sent to your provider as well.   To learn more about what you can do with MyChart, go to [email or web address].    Your next appointment:   F/U after echo  The format for your next appointment:   In Person  Provider:   You  may see Debbe Odea, MD or one of the following Advanced Practice Providers on your designated Care Team:   Nicolasa Ducking, NP Eula Listen, PA-C Cadence Fransico Michael, PA-C{  Important Information About Sugar

## 2022-06-21 NOTE — Progress Notes (Signed)
Cardiology Office Note:    Date:  06/21/2022   ID:  Robert Mccann, DOB 10/17/1996, MRN [id number]  PCP:  Ellene Route   [facility] Providers Cardiologist:  Kate Sable, MD     Referring MD: Ellene Route   Chief Complaint  Patient presents with   Follow-up    Patient reports getting low HR readings in the 40's over the past month. He also started taking 3 tablets of fursemide daily (120 mg) because he didn't feel 80 mg was helping. He has not been taking carvedilol because he was not sure whether or not he is supposed to be taking it.     History of Present Illness:    Robert Mccann is a 26 y.o. male with a hx of HFrEF EF <20%, LV thrombus, hypertension, diabetes, PE, obesity who presents for follow-up.  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  States having some weight gain and decreased urine output, increase Lasix dose to 60 mg yesterday with improvement in urine output.  Not taking carvedilol due to previous bradycardia.  Has appointment with advanced heart failure team next week.  Prior notes CMR 02/2022 EF 11%, LV thrombus Echo 05/2021 EF less than 20%, evidence of noncompaction. Mother has a history of ICD placement.  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 05/2021 noted during periods of sleep @ time of hospitalization for PE.   Cardiomyopathy ([place])    a. 05/2021 Echo: EF <20%, glob HK. [place]:C ratio of 2.1:1 suggesting non-compaction. Gr2 DD. Sev reduced RV fxn. Nl PASP. Mildly dil RA. Mild MR; b. 10/2021 Echo: EF <20%, GrII DD; c. 10/2021 TEE: EF 20-25%, glob HK, sev red RV fxn.   Diabetes mellitus without complication ([place])    HFrEF (heart failure with reduced ejection fraction) ([place])    a. 05/2021 Echo: EF <20%. ? non-compaction.   Hypertension    Pulmonary embolism ([place])    a. 05/2021 CTA Chest: PE extending from lobar arteris of RUL and RLL w/ concern for pulml  infarct-->s/p thrombolysis and thrombectomy of RUL/RML/RLL.   Stroke [facility])    a. 10/2021 CT Head: Bilat cerebral infarcts w/ mod-sided acute to subacute R temporoparietal infarct; b. 10/2021 MRI: R post PCA territory infarcts w/o hemorrhage; c. 10/2021 TEE: Neg bubble study. No LA/LAA thrombus. EF 20-25%, glob HK.    Past Surgical History:  Procedure Laterality Date   COLONOSCOPY N/A 05/18/2022   Procedure: COLONOSCOPY;  Surgeon: Sharyn Creamer, MD;  Location: [place];  Service: Gastroenterology;  Laterality: N/A;   HAND SURGERY Left age 89   HAND SURGERY     HEMOSTASIS CLIP PLACEMENT  05/18/2022   Procedure: HEMOSTASIS CLIP PLACEMENT;  Surgeon: Sharyn Creamer, MD;  Location: [place];  Service: Gastroenterology;;   POLYPECTOMY  05/18/2022   Procedure: POLYPECTOMY;  Surgeon: Sharyn Creamer, MD;  Location: [place];  Service: Gastroenterology;;   PULMONARY THROMBECTOMY N/A 06/28/2021   Procedure: PULMONARY THROMBECTOMY;  Surgeon: Algernon Huxley, MD;  Location: [place] CV LAB;  Service: Cardiovascular;  Laterality: N/A;   TEE WITHOUT CARDIOVERSION N/A 11/08/2021   Procedure: TRANSESOPHAGEAL ECHOCARDIOGRAM (TEE);  Surgeon: Kate Sable, MD;  Location: [facility] ORS;  Service: Cardiovascular;  Laterality: N/A;    Current Medications: Current Meds  Medication Sig   AMBULATORY NON FORMULARY MEDICATION Diltiazem 2% compounded with lidocaine 5% ointment applied to the affected area 3 times daily x 8 weeks   apixaban (  ELIQUIS) 5 MG TABS tablet Take 1 tablet (5 mg total) by mouth 2 (two) times daily.   Bempedoic Acid-Ezetimibe (NEXLIZET) 180-10 MG TABS Take 1 tablet by mouth daily.   BIOTIN PO Take 1 capsule by mouth daily.   Blood Glucose Monitoring Suppl (BLOOD GLUCOSE MONITOR SYSTEM) w/Device KIT Use up to four times daily as directed.   digoxin (LANOXIN) 0.125 MG tablet Take 1 tablet (0.125 mg total) by mouth daily.   FARXIGA 10 MG TABS tablet Take 10 mg by mouth daily.    furosemide (LASIX) 20 MG tablet Take 60 mg by mouth daily.   metFORMIN (GLUCOPHAGE) 500 MG tablet Take 1 tablet (500 mg total) by mouth 2 (two) times daily with a meal.   Multiple Vitamin (MULTIVITAMIN WITH MINERALS) TABS tablet Take 1 tablet by mouth daily.   potassium chloride (KLOR-CON) 20 MEQ packet Take 40 mEq by mouth daily. Take on days you take Lasix.   rosuvastatin (CRESTOR) 40 MG tablet Take 1 tablet (40 mg total) by mouth daily.   sacubitril-valsartan (ENTRESTO) 97-103 MG Take 1 tablet by mouth 2 (two) times daily.   Semaglutide, 1 MG/DOSE, 4 MG/3ML SOPN Inject 1 mg into the skin once a week.   spironolactone (ALDACTONE) 25 MG tablet Take 1 tablet (25 mg total) by mouth daily.     Allergies:   Patient has no known allergies.   Social History   Socioeconomic History   Marital status: Single    Spouse name: Not on file   Number of children: Not on file   Years of education: Not on file   Highest education level: Not on file  Occupational History   Not on file  Tobacco Use   Smoking status: Never   Smokeless tobacco: Never  Vaping Use   Vaping Use: Never used  Substance and Sexual Activity   Alcohol use: Not Currently    Comment: special occasions   Drug use: Yes    Types: Marijuana    Comment: last month   Sexual activity: Yes    Partners: Female  Other Topics Concern   Not on file  Social History Narrative   Smokes weed; No smoking cigarettes; no alcohol. 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 Lives in [place].  NO children.    Social Determinants of Health   Financial Resource Strain: Not on file  Food Insecurity: Not on file  Transportation Needs: Not on file  Physical Activity: Not on file  Stress: Not on file  Social Connections: Not on file     Family History: The patient's family history includes Cancer in his cousin and cousin; Cerebrovascular Accident in his mother; Diabetes in his father; Heart failure in his mother; Hypertension in his  father.  ROS:   Please see the history of present illness.     All other systems reviewed and are negative.  EKGs/Labs/Other Studies Reviewed:    The following studies were reviewed today:   EKG:  EKG is  ordered today.  The ekg ordered today demonstrates normal sinus rhythm, heart rate 84  Recent Labs: 03/17/2022: TSH 5.035 03/21/2022: Magnesium 2.2 04/25/2022: B Natriuretic Peptide 410.7 05/30/2022: BUN 11; Creatinine, Ser 0.94; Hemoglobin 16.6; Platelets 333; Potassium 3.8; Sodium 135 06/12/2022: ALT 38  Recent Lipid Panel    Component Value Date/Time   CHOL 98 (L) 06/12/2022 1140   TRIG 116 06/12/2022 1140   HDL 38 (L) 06/12/2022 1140   CHOLHDL 2.6 06/12/2022 1140   CHOLHDL 4.5 11/06/2021 0417  VLDL 29 11/06/2021 0417   LDLCALC 39 06/12/2022 1140     Risk Assessment/Calculations:          Physical Exam:    VS:  BP 100/74 (BP Location: Left Arm, Patient Position: Sitting, Cuff Size: Large)   Pulse 84   Ht _0  (1.803 m)   Wt (!) 319 lb (144.7 kg)   SpO2 98%   BMI 44.49 kg/m     Wt Readings from Last 3 Encounters:  06/21/22 (!) 319 lb (144.7 kg)  05/30/22 (!) 323 lb 9.6 oz (146.8 kg)  05/18/22 (!) 317 lb (143.8 kg)     GEN:  Well nourished, well developed in no acute distress HEENT: Normal NECK: No JVD; No carotid bruits CARDIAC: RRR, no murmurs, rubs, gallops RESPIRATORY: Diminished breath sounds at bases ABDOMEN: Soft, non-tender, distended MUSCULOSKELETAL:  No edema; No deformity  SKIN: Warm and dry NEUROLOGIC:  Alert and oriented x 3 PSYCHIATRIC:  Normal affect   ASSESSMENT:    1. Chronic systolic heart failure ([place])   2. Primary hypertension   3. LV (left ventricular) mural thrombus   4. PE (pulmonary thromboembolism) ([facility])     PLAN:    In order of problems listed above:  Cardiomyopathy, EF less than 20%, possibly nonischemic due to noncompaction .  Describes NYHA class II-III symptoms.  Weight gain.  Agree with increasing Lasix to 60  mg daily, check BMP in 5 days.  Continue Entresto 97-103 mg twice daily, Aldactone 25 mg daily, Farxiga.  Not on beta-blocker due to previous Mobitz 2.  Check echo in 2 months.  ICD consideration if EF stays low.  Appreciate input from heart failure team. Hypertension, BP controlled. Entresto, Aldactone as above. LV thrombus, continue Eliquis, repeat echo as above. History of PE s/p thrombectomy by vascular surgery.  On Eliquis.   Follow-up in 2 months   Medication Adjustments/Labs and Tests Ordered: Current medicines are reviewed at length with the patient today.  Concerns regarding medicines are outlined above.  Orders Placed This Encounter  Procedures   Basic Metabolic Panel (BMET)   EKG 12-Lead   ECHOCARDIOGRAM COMPLETE    No orders of the defined types were placed in this encounter.    Patient Instructions  Medication Instructions:  Continue taking furosemide 20 mg-3 tablets by mouth daily  *If you need a refill on your cardiac medications before your next appointment, please call your pharmacy*   Lab Work: None ordered  If you have labs (blood work) drawn today and your tests are completely normal, you will receive your results only by: [place] (if you have MyChart) OR A paper copy in the mail If you have any lab test that is abnormal or we need to change your treatment, we will call you to review the results.   Testing/Procedures: Your physician has requested that you have an echocardiogram in 2 months. Echocardiography is a painless test that uses sound waves to create images of your heart. It provides your doctor with information about the size and shape of your heart and how well your heart's chambers and valves are working. This procedure takes approximately one hour. There are no restrictions for this procedure.   Follow-Up: At [facility], you and your health needs are our priority.  As part of our continuing mission to provide you with exceptional  heart care, we have created designated Provider Care Teams.  These Care Teams include your primary Cardiologist (physician) and Advanced Practice Providers (APPs -  Physician  Assistants and Nurse Practitioners) who all work together to provide you with the care you need, when you need it.  We recommend signing up for the patient portal called "MyChart".  Sign up information is provided on this After Visit Summary.  MyChart is used to connect with patients for Virtual Visits (Telemedicine).  Patients are able to view lab/test results, encounter notes, upcoming appointments, etc.  Non-urgent messages can be sent to your provider as well.   To learn more about what you can do with MyChart, go to [email or web address].    Your next appointment:   F/U after echo  The format for your next appointment:   In Person  Provider:   You may see Kate Sable, MD or one of the following Advanced Practice Providers on your designated Care Team:   Murray Hodgkins, NP Christell Faith, PA-C Cadence Kathlen Mody, [place]  Important Information About Sugar         Signed, Kate Sable, MD  06/21/2022 10:54 AM    Robert Mccann

## 2022-06-25 ENCOUNTER — Encounter: Payer: 59 | Admitting: *Deleted

## 2022-06-25 DIAGNOSIS — I5022 Chronic systolic (congestive) heart failure: Secondary | ICD-10-CM

## 2022-06-26 ENCOUNTER — Ambulatory Visit (HOSPITAL_COMMUNITY)
Admission: RE | Admit: 2022-06-26 | Discharge: 2022-06-26 | Disposition: A | Discharge status: Home / Self Care | Payer: 59 | Source: Ambulatory Visit | Attending: Internal Medicine | Admitting: Internal Medicine

## 2022-06-26 ENCOUNTER — Ambulatory Visit (HOSPITAL_COMMUNITY)
Admission: RE | Admit: 2022-06-26 | Discharge: 2022-06-26 | Disposition: A | Discharge status: Home / Self Care | Payer: 59 | Source: Ambulatory Visit | Attending: Family Medicine | Admitting: Family Medicine

## 2022-06-26 ENCOUNTER — Encounter (HOSPITAL_COMMUNITY): Payer: Self-pay

## 2022-06-26 ENCOUNTER — Other Ambulatory Visit (HOSPITAL_COMMUNITY): Payer: Self-pay | Admitting: Family Medicine

## 2022-06-26 ENCOUNTER — Ambulatory Visit (HOSPITAL_COMMUNITY): Admission: RE | Admit: 2022-06-26 | Payer: 59 | Source: Ambulatory Visit

## 2022-06-26 VITALS — BP 102/74 | HR 87 | Wt 322.2 lb

## 2022-06-26 DIAGNOSIS — Z7984 Long term (current) use of oral hypoglycemic drugs: Secondary | ICD-10-CM | POA: Diagnosis not present

## 2022-06-26 DIAGNOSIS — I3139 Other pericardial effusion (noninflammatory): Secondary | ICD-10-CM | POA: Insufficient documentation

## 2022-06-26 DIAGNOSIS — D751 Secondary polycythemia: Secondary | ICD-10-CM | POA: Insufficient documentation

## 2022-06-26 DIAGNOSIS — E785 Hyperlipidemia, unspecified: Secondary | ICD-10-CM | POA: Diagnosis not present

## 2022-06-26 DIAGNOSIS — I110 Hypertensive heart disease with heart failure: Secondary | ICD-10-CM | POA: Diagnosis present

## 2022-06-26 DIAGNOSIS — I081 Rheumatic disorders of both mitral and tricuspid valves: Secondary | ICD-10-CM | POA: Insufficient documentation

## 2022-06-26 DIAGNOSIS — R5383 Other fatigue: Secondary | ICD-10-CM | POA: Insufficient documentation

## 2022-06-26 DIAGNOSIS — I513 Intracardiac thrombosis, not elsewhere classified: Secondary | ICD-10-CM

## 2022-06-26 DIAGNOSIS — Z79899 Other long term (current) drug therapy: Secondary | ICD-10-CM | POA: Insufficient documentation

## 2022-06-26 DIAGNOSIS — Z7901 Long term (current) use of anticoagulants: Secondary | ICD-10-CM | POA: Diagnosis not present

## 2022-06-26 DIAGNOSIS — Z6841 Body Mass Index (BMI) 40.0 and over, adult: Secondary | ICD-10-CM | POA: Insufficient documentation

## 2022-06-26 DIAGNOSIS — Z86711 Personal history of pulmonary embolism: Secondary | ICD-10-CM | POA: Diagnosis not present

## 2022-06-26 DIAGNOSIS — I441 Atrioventricular block, second degree: Secondary | ICD-10-CM | POA: Diagnosis not present

## 2022-06-26 DIAGNOSIS — I429 Cardiomyopathy, unspecified: Secondary | ICD-10-CM | POA: Insufficient documentation

## 2022-06-26 DIAGNOSIS — I5022 Chronic systolic (congestive) heart failure: Secondary | ICD-10-CM | POA: Diagnosis not present

## 2022-06-26 DIAGNOSIS — E119 Type 2 diabetes mellitus without complications: Secondary | ICD-10-CM | POA: Insufficient documentation

## 2022-06-26 DIAGNOSIS — I5082 Biventricular heart failure: Secondary | ICD-10-CM | POA: Insufficient documentation

## 2022-06-26 DIAGNOSIS — E1169 Type 2 diabetes mellitus with other specified complication: Secondary | ICD-10-CM

## 2022-06-26 DIAGNOSIS — R001 Bradycardia, unspecified: Secondary | ICD-10-CM

## 2022-06-26 DIAGNOSIS — I639 Cerebral infarction, unspecified: Secondary | ICD-10-CM

## 2022-06-26 DIAGNOSIS — I2699 Other pulmonary embolism without acute cor pulmonale: Secondary | ICD-10-CM

## 2022-06-26 DIAGNOSIS — Z8673 Personal history of transient ischemic attack (TIA), and cerebral infarction without residual deficits: Secondary | ICD-10-CM | POA: Insufficient documentation

## 2022-06-26 LAB — BASIC METABOLIC PANEL
Anion gap: 9 (ref 5–15)
BUN: 15 mg/dL (ref 6–20)
CO2: 27 mmol/L (ref 22–32)
Calcium: 9.5 mg/dL (ref 8.9–10.3)
Chloride: 99 mmol/L (ref 98–111)
Creatinine, Ser: 1.11 mg/dL (ref 0.61–1.24)
GFR, Estimated: >60 mL/min (ref >60)
Glucose, Bld: 116 mg/dL — ABNORMAL HIGH (ref 70–99)
Potassium: 4.2 mmol/L (ref 3.5–5.1)
Sodium: 135 mmol/L (ref 135–145)

## 2022-06-26 LAB — DIGOXIN LEVEL: Digoxin Level: 0.4 ng/mL — ABNORMAL LOW (ref 0.8–2.0)

## 2022-06-26 LAB — BRAIN NATRIURETIC PEPTIDE: B Natriuretic Peptide: 73.5 pg/mL (ref 0.0–100.0)

## 2022-06-26 MED ORDER — POTASSIUM CHLORIDE 20 MEQ PO PACK: 60.0000 meq | PACK | Freq: Every day | ORAL | 6 refills | Status: DC

## 2022-06-26 MED ORDER — FUROSEMIDE 20 MG PO TABS: 80.0000 mg | ORAL_TABLET | Freq: Every day | ORAL | 6 refills | Status: DC

## 2022-06-27 ENCOUNTER — Telehealth: Payer: Self-pay

## 2022-06-27 DIAGNOSIS — I5022 Chronic systolic (congestive) heart failure: Secondary | ICD-10-CM

## 2022-06-27 NOTE — Telephone Encounter (Signed)
Patient called to let us know that his doctor gave him a Zio patch to wear for 2 weeks and was told to be out of rehab until it is removed. He is supposed to take it off on 7/11, we'll plan for patient to return to rehab on 7/12  with clearance or unless his doctors says otherwise based on results.

## 2022-06-28 ENCOUNTER — Other Ambulatory Visit: Payer: Self-pay | Admitting: Internal Medicine

## 2022-06-28 ENCOUNTER — Telehealth: Payer: Self-pay | Admitting: Pharmacist

## 2022-06-28 NOTE — Telephone Encounter (Signed)
Called pt to follow up with tolerability of Ozempic 1mg  weekly. If tolerating well, will increase to maintenance dose of 2mg  weekly.  Left message for pt.

## 2022-06-28 NOTE — Telephone Encounter (Signed)
Left message for pt to f/u with Ozempic tolerability, will inc to 2mg  weekly if tolerating well.

## 2022-06-29 NOTE — Addendum Note (Signed)
Encounter addended by: Noralee Space, RN on: 06/29/2022 5:06 PM  Actions taken: Order list changed, Diagnosis association updated

## 2022-07-04 MED ORDER — OZEMPIC (2 MG/DOSE) 8 MG/3ML ~~LOC~~ SOPN: 2.0000 mg | PEN_INJECTOR | SUBCUTANEOUS | 11 refills | Status: DC

## 2022-07-04 NOTE — Telephone Encounter (Signed)
Spoke with pt, reports eating less, tolerating Ozempic well. Will send in rx for maintenance dose of 2mg  weekly.

## 2022-07-04 NOTE — Telephone Encounter (Signed)
Pt left voicemail stating he needed Ozempic refill. Called pt again and left another message, need to assess tolerability for potential dose increase first.

## 2022-07-05 ENCOUNTER — Telehealth: Payer: Self-pay | Admitting: *Deleted

## 2022-07-05 NOTE — Telephone Encounter (Signed)
Spoke with patient at [phone]. He plans on returning to cardiac rehab 07/11/2022.

## 2022-07-11 ENCOUNTER — Encounter: Payer: 59 | Attending: Internal Medicine

## 2022-07-11 ENCOUNTER — Encounter: Payer: Self-pay | Admitting: *Deleted

## 2022-07-11 DIAGNOSIS — I5022 Chronic systolic (congestive) heart failure: Secondary | ICD-10-CM | POA: Diagnosis present

## 2022-07-11 NOTE — Progress Notes (Signed)
Daily Session Note  Patient Details  Name: Robert Mccann MRN: [id number] Date of Birth: 1996-10-08 Referring Provider:   Flowsheet Row Cardiac Rehab from 04/19/2022 in [facility] Cardiac [facility] Pulmonary [facility]  Referring Provider Glori Bickers MD       Encounter Date: 07/11/2022  Check In:  Session Check In - 07/11/22 1116       Check-In   Supervising physician immediately available to respond to emergencies See telemetry face sheet for immediately available ER MD    Location [facility]-Cardiac & Pulmonary Rehab    Staff Present Birdie Sons, MPA, RN;Melissa [place], RDN, LDN;Jessica La Carla, MA, RCEP, CCRP, CCET;Joseph [place], [place]    Virtual Visit No    Medication changes reported     No    Fall or balance concerns reported    No    Warm-up and Cool-down Performed on first and last piece of equipment    Resistance Training Performed Yes    VAD Patient? No    PAD/SET Patient? No      Pain Assessment   Currently in Pain? No/denies                Social History   Tobacco Use  Smoking Status Never  Smokeless Tobacco Never    Goals Met:  Independence with exercise equipment Exercise tolerated well No report of concerns or symptoms today Strength training completed today  Goals Unmet:  Not Applicable  Comments: Pt able to follow exercise prescription today without complaint.  Will continue to monitor for progression.    Dr. Emily Filbert is Medical Director for [facility].  Dr. Ottie Glazier is Medical Director for [facility] Pulmonary Rehabilitation.

## 2022-07-11 NOTE — Progress Notes (Signed)
Cardiac Individual Treatment Plan  Patient Details  Name: Robert Mccann MRN: [id number] Date of Birth: [birth year] Referring Provider:   Flowsheet Row Cardiac Rehab from 04/19/2022 in [facility] Cardiac and Pulmonary Rehab  Referring Provider Glori Bickers MD       Initial Encounter Date:  Flowsheet Row Cardiac Rehab from 04/19/2022 in [facility] Cardiac and Pulmonary Rehab  Date 04/19/22       Visit Diagnosis: Heart failure, chronic systolic ([place])  Patient's Home Medications on Admission:  Current Outpatient Medications:    apixaban (ELIQUIS) 5 MG TABS tablet, Take 1 tablet (5 mg total) by mouth 2 (two) times daily., Disp: 60 tablet, Rfl: 6   Bempedoic Acid-Ezetimibe (NEXLIZET) 180-10 MG TABS, Take 1 tablet by mouth daily., Disp: 90 tablet, Rfl: 3   Blood Glucose Monitoring Suppl (BLOOD GLUCOSE MONITOR SYSTEM) w/Device KIT, Use up to four times daily as directed. (Patient not taking: Reported on 06/26/2022), Disp: 1 kit, Rfl: 3   digoxin (LANOXIN) 0.125 MG tablet, Take 1 tablet (0.125 mg total) by mouth daily., Disp: 30 tablet, Rfl: 6   FARXIGA 10 MG TABS tablet, Take 10 mg by mouth daily., Disp: , Rfl:    furosemide (LASIX) 20 MG tablet, Take 4 tablets (80 mg total) by mouth daily., Disp: 120 tablet, Rfl: 6   metFORMIN (GLUCOPHAGE) 500 MG tablet, Take 1 tablet (500 mg total) by mouth 2 (two) times daily with a meal., Disp: 60 tablet, Rfl: 0   potassium chloride (KLOR-CON) 20 MEQ packet, Take 60 mEq by mouth daily., Disp: 90 packet, Rfl: 6   rosuvastatin (CRESTOR) 40 MG tablet, Take 1 tablet (40 mg total) by mouth daily., Disp: 90 tablet, Rfl: 3   sacubitril-valsartan (ENTRESTO) 97-103 MG, Take 1 tablet by mouth 2 (two) times daily., Disp: 60 tablet, Rfl: 6   Semaglutide, 2 MG/DOSE, (OZEMPIC, 2 MG/DOSE,) 8 MG/3ML SOPN, Inject 2 mg into the skin once a week., Disp: 3 mL, Rfl: 11   spironolactone (ALDACTONE) 25 MG tablet, Take 1 tablet (25 mg total) by mouth daily., Disp: 30 tablet, Rfl:  6  Past Medical History: Past Medical History:  Diagnosis Date   AV block, [place]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 05/2021 noted during periods of sleep @ time of hospitalization for PE.   Cardiomyopathy ([place])    a. 05/2021 Echo: EF <20%, glob HK. [place]:C ratio of 2.1:1 suggesting non-compaction. Gr2 DD. Sev reduced RV fxn. Nl PASP. Mildly dil RA. Mild MR; b. 10/2021 Echo: EF <20%, GrII DD; c. 10/2021 TEE: EF 20-25%, glob HK, sev red RV fxn.   Diabetes mellitus without complication ([place])    HFrEF (heart failure with reduced ejection fraction) ([place])    a. 05/2021 Echo: EF <20%. ? non-compaction.   Hypertension    Pulmonary embolism ([place])    a. 05/2021 CTA Chest: PE extending from lobar arteris of RUL and RLL w/ concern for pulml infarct-->s/p thrombolysis and thrombectomy of RUL/RML/RLL.   Stroke [facility])    a. 10/2021 CT Head: Bilat cerebral infarcts w/ mod-sided acute to subacute R temporoparietal infarct; b. 10/2021 MRI: R post PCA territory infarcts w/o hemorrhage; c. 10/2021 TEE: Neg bubble study. No LA/LAA thrombus. EF 20-25%, glob HK.    Tobacco Use: Social History   Tobacco Use  Smoking Status Never  Smokeless Tobacco Never    Labs: Review Flowsheet  More data exists      Latest Ref Rng & Units 03/19/2022 03/20/2022 03/21/2022 04/30/2022 06/12/2022  Labs for ITP Cardiac and Pulmonary Rehab  Cholestrol 100 - 199 mg/dL - - - 154  98   LDL (calc) 0 - 99 mg/dL - - - 80  39   HDL-C >39 mg/dL - - - 40  38   Trlycerides 0 - 149 mg/dL - - - 199  116   O2 Saturation % 71.7  86.7  68.4  - -     Exercise Target Goals: Exercise Program Goal: Individual exercise prescription set using results from initial 6 min walk test and THRR while considering  patient's activity barriers and safety.   Exercise Prescription Goal: Initial exercise prescription builds to 30-45 minutes a day of aerobic activity, 2-3 days per week.  Home exercise guidelines will be given to patient during program as part of exercise prescription that  the participant will acknowledge.   Education: Aerobic Exercise: - Group verbal and visual presentation on the components of exercise prescription. Introduces F.I.T.T principle from ACSM for exercise prescriptions.  Reviews F.I.T.T. principles of aerobic exercise including progression. Written material given at graduation. Flowsheet Row Cardiac Rehab from 06/20/2022 in [facility] Cardiac and Pulmonary Rehab  Education need identified 04/19/22  Date 05/02/22  Educator [place]  Instruction Review Code 1- [employer] Understanding       Education: Resistance Exercise: - Group verbal and visual presentation on the components of exercise prescription. Introduces F.I.T.T principle from ACSM for exercise prescriptions  Reviews F.I.T.T. principles of resistance exercise including progression. Written material given at graduation. Flowsheet Row Cardiac Rehab from 06/20/2022 in [facility] Cardiac and Pulmonary Rehab  Date 05/09/22  Educator [facility] [address]  Instruction Review Code 1- Verbalizes Understanding        Education: Exercise & Equipment Safety: - Individual verbal instruction and demonstration of equipment use and safety with use of the equipment. Flowsheet Row Cardiac Rehab from 06/20/2022 in [facility] Cardiac and Pulmonary Rehab  Education need identified 04/19/22  Date 04/19/22  Educator [place]  Instruction Review Code 1- Verbalizes Understanding       Education: Exercise Physiology & General Exercise Guidelines: - Group verbal and written instruction with models to review the exercise physiology of the cardiovascular system and associated critical values. Provides general exercise guidelines with specific guidelines to those with heart or lung disease.  Flowsheet Row Cardiac Rehab from 06/20/2022 in [facility] Cardiac and Pulmonary Rehab  Education need identified 04/19/22       Education: Flexibility, Balance, Mind/Body Relaxation: - Group verbal and visual presentation with interactive activity on the components of  exercise prescription. Introduces F.I.T.T principle from ACSM for exercise prescriptions. Reviews F.I.T.T. principles of flexibility and balance exercise training including progression. Also discusses the mind body connection.  Reviews various relaxation techniques to help reduce and manage stress (i.e. Deep breathing, progressive muscle relaxation, and visualization). Balance handout provided to take home. Written material given at graduation. Flowsheet Row Cardiac Rehab from 06/20/2022 in [facility] Cardiac and Pulmonary Rehab  Date 05/16/22  Educator [facility]  Instruction Review Code 1- Verbalizes Understanding       Activity Barriers & Risk Stratification:  Activity Barriers & Cardiac Risk Stratification - 04/19/22 1617       Activity Barriers & Cardiac Risk Stratification   Activity Barriers Decreased Ventricular Function;Deconditioning;Muscular Weakness    Comments calf muscle left leg (MD aware)    Cardiac Risk Stratification High             6 Minute Walk:  6 Minute Walk     Row Name 04/19/22 1617         6 Minute  Walk   Phase Initial     Distance 1020 feet     Walk Time 6 minutes     # of Rest Breaks 0     MPH 1.93     METS 3.88     RPE 9     Perceived Dyspnea  0     VO2 Peak 13.6     Symptoms Yes (comment)     Comments Left calf pain 3/10     Resting HR 92 bpm     Resting BP 104/70     Resting Oxygen Saturation  98 %     Exercise Oxygen Saturation  during 6 min walk 98 %     Max Ex. HR 119 bpm     Max Ex. BP 132/74     2 Minute Post BP 108/78              Oxygen Initial Assessment:   Oxygen Re-Evaluation:   Oxygen Discharge (Final Oxygen Re-Evaluation):   Initial Exercise Prescription:  Initial Exercise Prescription - 04/19/22 1600       Date of Initial Exercise RX and Referring Provider   Date 04/19/22    Referring Provider Glori Bickers MD      Oxygen   Maintain Oxygen Saturation 88% or higher      NuStep   Level 1    SPM 80     Minutes 15    METs 3.8      REL-XR   Level 1    Speed 50    Minutes 15    METs 3.8      T5 Nustep   Level 1    SPM 80    Minutes 15    METs 3.8      Track   Laps 21   As tolerated - take breaks as needed   Minutes 15    METs 2.14      Prescription Details   Frequency (times per week) 3    Duration Progress to 30 minutes of continuous aerobic without signs/symptoms of physical distress      Intensity   THRR 40-80% of Max Heartrate 132 - 162    Ratings of Perceived Exertion 11-13    Perceived Dyspnea 0-4      Progression   Progression Continue to progress workloads to maintain intensity without signs/symptoms of physical distress.      Resistance Training   Training Prescription Yes    Weight 3 lb    Reps 10-15             Perform Capillary Blood Glucose checks as needed.  Exercise Prescription Changes:   Exercise Prescription Changes     Row Name 04/19/22 1600 04/24/22 1600 05/08/22 1500 05/14/22 1100 05/21/22 1600     Response to Exercise   Blood Pressure (Admit) 104/70 110/78 110/64 -- 116/72   Blood Pressure (Exercise) 132/74 122/78 158/62 -- 124/82   Blood Pressure (Exit) 108/78 104/74 104/60 -- 106/64   Heart Rate (Admit) 92 bpm 93 bpm 103 bpm -- 96 bpm   Heart Rate (Exercise) 119 bpm 122 bpm 133 bpm -- 114 bpm   Heart Rate (Exit) 96 bpm 99 bpm 96 bpm -- 87 bpm   Oxygen Saturation (Admit) 98 % -- -- -- --   Oxygen Saturation (Exercise) 98 % -- -- -- --   Rating of Perceived Exertion (Exercise) _0 -- 11   Perceived Dyspnea (Exercise) 0 -- -- -- --  Symptoms Left calf pain 3/10 none none -- none   Comments walk test results first full day of exercise -- -- --   Duration -- Progress to 30 minutes of  aerobic without signs/symptoms of physical distress Continue with 30 min of aerobic exercise without signs/symptoms of physical distress. -- Continue with 30 min of aerobic exercise without signs/symptoms of physical distress.   Intensity -- THRR  unchanged THRR unchanged -- THRR unchanged     Progression   Progression -- Continue to progress workloads to maintain intensity without signs/symptoms of physical distress. Continue to progress workloads to maintain intensity without signs/symptoms of physical distress. -- Continue to progress workloads to maintain intensity without signs/symptoms of physical distress.   Average METs -- 2.07 2.5 -- 2.66     Resistance Training   Training Prescription -- Yes Yes -- Yes   Weight -- 3 lb 3 lb -- 7 lb   Reps -- 10-15 10-15 -- 10-15     Interval Training   Interval Training -- No No -- No     Treadmill   MPH -- -- 1.5 -- 1.7   Grade -- -- 2.5 -- 2.5   Minutes -- -- 15 -- 15   METs -- -- 2.67 -- 2.89     NuStep   Level -- 1 3 -- 4   Minutes -- 15 15 -- 15   METs -- 2 2.5 -- 3.5     Recumbant Elliptical   Level -- -- 2 -- 3   Minutes -- -- 15 -- 15   METs -- -- -- -- 1.6     Track   Laps -- 21 21 -- --   Minutes -- 15 15 -- --   METs -- 2.14 2.14 -- --     Home Exercise Plan   Plans to continue exercise at -- -- -- Home (comment)  walking and bike Home (comment)  walking and bike   Frequency -- -- -- Add 2 additional days to program exercise sessions. Add 2 additional days to program exercise sessions.   Initial Home Exercises Provided -- -- -- 05/14/22 05/14/22     Oxygen   Maintain Oxygen Saturation -- 88% or higher 88% or higher -- 88% or higher    Row Name 06/04/22 1000 06/18/22 1400 07/02/22 0800         Response to Exercise   Blood Pressure (Admit) 104/62 124/70 122/62     Blood Pressure (Exercise) 134/60 -- --     Blood Pressure (Exit) 100/56 110/62 118/62     Heart Rate (Admit) 86 bpm 87 bpm 82 bpm     Heart Rate (Exercise) 145 bpm 108 bpm 119 bpm     Heart Rate (Exit) 108 bpm 89 bpm 93 bpm     Rating of Perceived Exertion (Exercise) _0 Symptoms none none none     Duration Continue with 30 min of aerobic exercise without signs/symptoms of physical  distress. Continue with 30 min of aerobic exercise without signs/symptoms of physical distress. Continue with 30 min of aerobic exercise without signs/symptoms of physical distress.     Intensity THRR unchanged THRR unchanged THRR unchanged       Progression   Progression Continue to progress workloads to maintain intensity without signs/symptoms of physical distress. Continue to progress workloads to maintain intensity without signs/symptoms of physical distress. Continue to progress workloads to maintain intensity without signs/symptoms of physical distress.  Average METs 3.74 3.07 2.65       Resistance Training   Training Prescription Yes Yes Yes     Weight 7 lb 7 lb 7 lb     Reps 10-15 10-15 10-15       Interval Training   Interval Training Yes No No     Equipment Treadmill -- --     Comments up to 4% incline -- --       Treadmill   MPH 1.7 2.4 2.5     Grade 4  intervals 4 1     Minutes _0 METs 3.24 4.16 3.26       NuStep   Level _1 Minutes _2 METs 4.1 2.3 2.2       Recumbant Elliptical   Level -- 2.5 --     Minutes -- 15 --     METs -- 3.2 --       REL-XR   Level -- 6 --     Minutes -- 15 --     METs -- 2.1 --       Track   Laps -- 17 17     Minutes -- 15 15     METs -- 1.92 1.92       Home Exercise Plan   Plans to continue exercise at Home (comment)  walking and bike Home (comment)  walking and bike Home (comment)  walking and bike     Frequency Add 2 additional days to program exercise sessions. Add 2 additional days to program exercise sessions. Add 2 additional days to program exercise sessions.     Initial Home Exercises Provided 05/14/22 05/14/22 05/14/22       Oxygen   Maintain Oxygen Saturation 88% or higher 88% or higher 88% or higher              Exercise Comments:   Exercise Goals and Review:   Exercise Goals     Row Name 04/19/22 1628             Exercise Goals   Increase Physical Activity Yes        Intervention Provide advice, education, support and counseling about physical activity/exercise needs.;Develop an individualized exercise prescription for aerobic and resistive training based on initial evaluation findings, risk stratification, comorbidities and participant's personal goals.       Expected Outcomes Short Term: Attend rehab on a regular basis to increase amount of physical activity.;Long Term: Add in home exercise to make exercise part of routine and to increase amount of physical activity.;Long Term: Exercising regularly at least 3-5 days a week.       Increase Strength and Stamina Yes       Intervention Provide advice, education, support and counseling about physical activity/exercise needs.;Develop an individualized exercise prescription for aerobic and resistive training based on initial evaluation findings, risk stratification, comorbidities and participant's personal goals.       Expected Outcomes Short Term: Increase workloads from initial exercise prescription for resistance, speed, and METs.;Short Term: Perform resistance training exercises routinely during rehab and add in resistance training at home;Long Term: Improve cardiorespiratory fitness, muscular endurance and strength as measured by increased METs and functional capacity (6MWT)       Able to understand and use rate of perceived exertion (RPE) scale Yes       Intervention Provide education and explanation on how to use  RPE scale       Expected Outcomes Short Term: Able to use RPE daily in rehab to express subjective intensity level;Long Term:  Able to use RPE to guide intensity level when exercising independently       Able to understand and use Dyspnea scale Yes       Intervention Provide education and explanation on how to use Dyspnea scale       Expected Outcomes Short Term: Able to use Dyspnea scale daily in rehab to express subjective sense of shortness of breath during exertion;Long Term: Able to use Dyspnea scale to  guide intensity level when exercising independently       Knowledge and understanding of Target Heart Rate Range (THRR) Yes       Intervention Provide education and explanation of THRR including how the numbers were predicted and where they are located for reference       Expected Outcomes Short Term: Able to state/look up THRR;Long Term: Able to use THRR to govern intensity when exercising independently;Short Term: Able to use daily as guideline for intensity in rehab       Able to check pulse independently Yes       Intervention Provide education and demonstration on how to check pulse in carotid and radial arteries.;Review the importance of being able to check your own pulse for safety during independent exercise       Expected Outcomes Short Term: Able to explain why pulse checking is important during independent exercise;Long Term: Able to check pulse independently and accurately       Understanding of Exercise Prescription Yes       Intervention Provide education, explanation, and written materials on patient's individual exercise prescription       Expected Outcomes Short Term: Able to explain program exercise prescription;Long Term: Able to explain home exercise prescription to exercise independently                Exercise Goals Re-Evaluation :  Exercise Goals Re-Evaluation     Row Name 04/23/22 1114 05/08/22 1607 05/14/22 1120 05/21/22 1612 06/04/22 1041     Exercise Goal Re-Evaluation   Exercise Goals Review Increase Physical Activity;Able to understand and use rate of perceived exertion (RPE) scale;Knowledge and understanding of Target Heart Rate Range (THRR);Understanding of Exercise Prescription;Increase Strength and Stamina;Able to check pulse independently Increase Physical Activity;Understanding of Exercise Prescription;Increase Strength and Stamina Increase Physical Activity;Understanding of Exercise Prescription;Increase Strength and Stamina;Able to understand and use rate  of perceived exertion (RPE) scale;Able to understand and use Dyspnea scale;Knowledge and understanding of Target Heart Rate Range (THRR);Able to check pulse independently Increase Physical Activity;Increase Strength and Stamina;Understanding of Exercise Prescription Increase Physical Activity;Increase Strength and Stamina;Understanding of Exercise Prescription   Comments Reviewed RPE and dyspnea scales, THR and program prescription with pt today.  Pt voiced understanding and was given a copy of goals to take home. Mackenzie has been doing well during his first few weeks in rehab. He improved his average METs to 2.5. He increased to Level 3 on the T4NS machine. He has begun to hit his THR while performing exercise. We advised pt to start with lower RPEs, he will now increase RPEs to normal level. We will continue to monitor pt as he progresses in the rehab program. Reviewed home exercise with pt today.  Pt plans to walk and use stationary bike at home for exercise.  Reviewed THR, pulse, RPE, sign and symptoms, pulse oximetery and when to call 911 or MD.  Also discussed weather considerations and indoor options.  Pt voiced understanding. Gurshan is doing well in rehab.  We have increased his RPE up to normal levels as he has not had symptoms in rehab. His weight has been on a downward trend as well.  He is up to level 3 on the recumbent elliptical.  We will continue to monitor his progress. Champ continues to do well in rehab. He has been out the last week but was still able to improve on the T4 Nustep to level 5. He also started intervals on the treadmill working up to 4% incline.  He is also now up to 7 lbs for handweights. Will continue to monitor.   Expected Outcomes Short: Use RPE daily to regulate intensity. Long: Follow program prescription in THR. Short: increase RPEs during exercise. Long: Follow program to improve strength and stamina. Short: Start to add in exercise at home Long: Conitnue to exercise  independently short: Try 2 mph on treadmill Long: continue to improve stamina and work on weight loss Short: Continue to increase workloads on treadmill Long: Continue to increase overall MET level    Row Name 06/18/22 1429 07/02/22 0817 07/11/22 1111         Exercise Goal Re-Evaluation   Exercise Goals Review Increase Physical Activity;Increase Strength and Stamina;Understanding of Exercise Prescription Increase Physical Activity;Increase Strength and Stamina;Understanding of Exercise Prescription Increase Physical Activity;Increase Strength and Stamina;Understanding of Exercise Prescription     Comments Cynthia is still doing well in rehab. He tried out the XR at level 6 and tolerated it well. He continues to work on the treadmill with a 4% incline. He hits his THR some sessions. He would benefit from increasing his level on the REL. Will continue to monitor. Davian is doing well in rehab. He improved his speed on the treadmill to 2.5 mph. He has also done well with 7 lb hand weights for resistance training. We will continue to monitor his progress in the program. Gillis reports is not doing any structured exercise outside of rehab - he reports staying active and chasing his daughter around the park. He plans to go to [employer] outside of rehab soon. He also has a stationary bike and regular bike as well he can use. Encouraged to warm up and cool down.     Expected Outcomes Short: Increase level on REL Long: Continue to build up overall strength and stamina Short: add incline back on treadmill. Long: Continue to improve overall MET level. Short: Exercise outside of rehab at [place]: Continue to improve overall MET level.              Discharge Exercise Prescription (Final Exercise Prescription Changes):  Exercise Prescription Changes - 07/02/22 0800       Response to Exercise   Blood Pressure (Admit) 122/62    Blood Pressure (Exit) 118/62    Heart Rate (Admit) 82 bpm    Heart  Rate (Exercise) 119 bpm    Heart Rate (Exit) 93 bpm    Rating of Perceived Exertion (Exercise) 11    Symptoms none    Duration Continue with 30 min of aerobic exercise without signs/symptoms of physical distress.    Intensity THRR unchanged      Progression   Progression Continue to progress workloads to maintain intensity without signs/symptoms of physical distress.    Average METs 2.65      Resistance Training   Training Prescription Yes    Weight 7 lb  Reps 10-15      Interval Training   Interval Training No      Treadmill   MPH 2.5    Grade 1    Minutes 15    METs 3.26      NuStep   Level 4    Minutes 15    METs 2.2      Track   Laps 17    Minutes 15    METs 1.92      Home Exercise Plan   Plans to continue exercise at Home (comment)   walking and bike   Frequency Add 2 additional days to program exercise sessions.    Initial Home Exercises Provided 05/14/22      Oxygen   Maintain Oxygen Saturation 88% or higher             Nutrition:  Target Goals: Understanding of nutrition guidelines, daily intake of sodium <1555m, cholesterol <2056m calories 30% from fat and 7% or less from saturated fats, daily to have 5 or more servings of fruits and vegetables.  Education: All About Nutrition: -Group instruction provided by verbal, written material, interactive activities, discussions, models, and posters to present general guidelines for heart healthy nutrition including fat, fiber, MyPlate, the role of sodium in heart healthy nutrition, utilization of the nutrition label, and utilization of this knowledge for meal planning. Follow up email sent as well. Written material given at graduation. [facility]  Date 05/23/22  [facility] 1- Verbalizes Understanding       Biometrics:  Pre Biometrics - 04/19/22 1616       Pre Biometrics   Height _0  (1.803 m)     Weight 321 lb 3.2 oz (145.7 kg)    BMI (Calculated) 44.82              Nutrition Therapy Plan and Nutrition Goals:  Nutrition Therapy & Goals - 06/18/22 1148       Nutrition Therapy   Diet Heart healthy, low Na, T2DM MNT    Drug/Food Interactions Statins/Certain Fruits    Protein (specify units) 80g   used adjusted body weight   Fiber 30 grams    Whole Grain Foods 3 servings    Saturated Fats 16 max. grams    Fruits and Vegetables 8 servings/day    Sodium 2 grams      Personal Nutrition Goals   Nutrition Goal ST: limit sugar sweetened beverages, eat consistently during the day, practice MyPlate guidleines, refrigerate rice from chinese food place to have a better BG response  LT: meet nutritional needs while persuing weight loss, follow MyPlate guidelines, increase variety of foods, limit added sugar <36g/day    Comments [age].o. M admitted to cardiac rehab chronic systolic HF. PMHx includes HTN, NSTEMI, HFrEF, T2DM, HLD. Relevant medications includes biotin, metformin, farxiga, K+, crestor, ozempic. Latest A1C = 8.9%. PYP Score: 56. Vegetables & Fruits 9/12. Breads, Grains & Cereals 10/12. Red & Processed Meat 8/12. Poultry 2/2. Fish & Shellfish 2/4. Beans, Nuts & Seeds 4/4. Milk & Dairy Foods 2/6. Toppings, Oils, Seasonings & Salt 5/20. Sweets, Snacks & Restaurant Food 8/14. Beverages 6/10.  Tevan typically eats 2 meals per day: splits chinese food of shrimp and broccoli with white rice. Sometimes cereal - captain crunch, honey bunches of oats with whole milk. He drinks juice and water during the day - he has also bought trulemon to flavor his water.  The ozempic has reduced his appetite as well as the weather. He reports that he has belching with foul odor - discussed how this could be due to reflux or certain medications, recommended to discuss this with his doctor. He also reports 20lb weight fluctuations in short amount of time - discussed how this was likely fluid and to tell his  doctor- also advised to limit restaurant food as this can be very high in salt whcih can worsen fluid retention. Discussed T2DM MNT as well as heart healhty eating. Arya would like to lose weight, but is having trouble doing so - discussed body's compensatory mechanisms and encouraged to focus on eating consistently, limit excess calories from sugar-sweetened beverages, following MyPlate guidelines for proportions of foods as well as encouraging more nutrient dense foods. Provided some calorie estimations, but emphasized as a starting point as calorie needs and weight management are more complicated - advised to not eat below his BMR.      Intervention Plan   Intervention Prescribe, educate and counsel regarding individualized specific dietary modifications aiming towards targeted core components such as weight, hypertension, lipid management, diabetes, heart failure and other comorbidities.    Expected Outcomes Short Term Goal: Understand basic principles of dietary content, such as calories, fat, sodium, cholesterol and nutrients.;Short Term Goal: A plan has been developed with personal nutrition goals set during dietitian appointment.;Long Term Goal: Adherence to prescribed nutrition plan.             Nutrition Assessments:  MEDIFICTS Score Key: ?70 Need to make dietary changes  40-70 Heart Healthy Diet ? 40 Therapeutic Level Cholesterol Diet  Flowsheet Row Cardiac Rehab from 04/19/2022 in [facility] Cardiac and Pulmonary Rehab  Picture Your Plate Total Score on Admission 56      Picture Your Plate Scores: <92 Unhealthy dietary pattern with much room for improvement. 41-50 Dietary pattern unlikely to meet recommendations for good health and room for improvement. 51-60 More healthful dietary pattern, with some room for improvement.  >60 Healthy dietary pattern, although there may be some specific behaviors that could be improved.    Nutrition Goals Re-Evaluation:  Nutrition Goals  Re-Evaluation     [place] Name 05/14/22 1140 05/21/22 1149 07/11/22 1114         Goals   Current Weight 319 lb (144.7 kg) -- --     Nutrition Goal Lose more weight., Cut back on fruit juice. -- ST: limit sugar sweetened beverages, eat consistently during the day, practice MyPlate guidleines, refrigerate rice from chinese food place to have a better BG response  LT: meet nutritional needs while persuing weight loss, follow MyPlate guidelines, increase variety of foods, limit added sugar <36g/day     Comment Isadore states that he loves juice. Informed him that it has alot of sugar and extra calories. He understands that he may be able to lose some weight with cutting out some of his juice intake. Gave him some guidance on other drinks to consider. Usiel rescheduled nutrition appt. for 06/18/22. Letitia Libra has been cooking more at home and trying to limit [place] food.     Expected Outcome Short: cut back on sugary juices. Long: adhere to a diet that pertains to him. -- ST: limit sugar sweetened beverages, eat consistently during the day, practice MyPlate guidleines, refrigerate rice from chinese food place to have a better BG response  LT: meet nutritional needs while persuing weight loss, follow MyPlate guidelines, increase variety of foods, limit added sugar <36g/day  Nutrition Goals Discharge (Final Nutrition Goals Re-Evaluation):  Nutrition Goals Re-Evaluation - 07/11/22 1114       Goals   Nutrition Goal ST: limit sugar sweetened beverages, eat consistently during the day, practice MyPlate guidleines, refrigerate rice from chinese food place to have a better BG response  LT: meet nutritional needs while persuing weight loss, follow MyPlate guidelines, increase variety of foods, limit added sugar <36g/day    Comment Letitia Libra has been cooking more at home and trying to limit [place] food.    Expected Outcome ST: limit sugar sweetened beverages, eat consistently during the day, practice MyPlate  guidleines, refrigerate rice from chinese food place to have a better BG response  LT: meet nutritional needs while persuing weight loss, follow MyPlate guidelines, increase variety of foods, limit added sugar <36g/day             Psychosocial: Target Goals: Acknowledge presence or absence of significant depression and/or stress, maximize coping skills, provide positive support system. Participant is able to verbalize types and ability to use techniques and skills needed for reducing stress and depression.   Education: Stress, Anxiety, and Depression - Group verbal and visual presentation to define topics covered.  Reviews how body is impacted by stress, anxiety, and depression.  Also discusses healthy ways to reduce stress and to treat/manage anxiety and depression.  Written material given at graduation. Flowsheet Row Cardiac Rehab from 06/20/2022 in [facility] Cardiac and Pulmonary Rehab  Date 06/20/22  Educator [facility]  Instruction Review Code 1- [employer] Understanding       Education: Sleep Hygiene -Provides group verbal and written instruction about how sleep can affect your health.  Define sleep hygiene, discuss sleep cycles and impact of sleep habits. Review good sleep hygiene tips.    Initial Review & Psychosocial Screening:  Initial Psych Review & Screening - 04/16/22 1412       Initial Review   Current issues with None Identified      Family Dynamics   Good Support System? Yes   parents and girlfriend     Barriers   Psychosocial barriers to participate in program There are no identifiable barriers or psychosocial needs.;The patient should benefit from training in stress management and relaxation.      Screening Interventions   Interventions Encouraged to exercise;Provide feedback about the scores to participant;To provide support and resources with identified psychosocial needs    Expected Outcomes Short Term goal: Utilizing psychosocial counselor, staff and physician to  assist with identification of specific Stressors or current issues interfering with healing process. Setting desired goal for each stressor or current issue identified.;Long Term Goal: Stressors or current issues are controlled or eliminated.;Short Term goal: Identification and review with participant of any Quality of Life or Depression concerns found by scoring the questionnaire.;Long Term goal: The participant improves quality of Life and PHQ9 Scores as seen by post scores and/or verbalization of changes             Quality of Life Scores:   Quality of Life - 04/19/22 1401       Quality of Life   Select Quality of Life      Quality of Life Scores   Health/Function Pre 23.3 %    Socioeconomic Pre 25 %    Psych/Spiritual Pre 26.79 %    Family Pre 18.5 %    GLOBAL Pre 23.7 %            Scores of 19 and below usually indicate a poorer quality  of life in these areas.  A difference of  2-3 points is a clinically meaningful difference.  A difference of 2-3 points in the total score of the Quality of Life Index has been associated with significant improvement in overall quality of life, self-image, physical symptoms, and general health in studies assessing change in quality of life.  PHQ-9: Review Flowsheet       04/19/2022 01/05/2022 07/11/2021  Depression screen PHQ 2/9  Decreased Interest 0 0 0  Down, Depressed, Hopeless 0 0 0  PHQ - 2 Score 0 0 0  Altered sleeping 0 - -  Tired, decreased energy 1 - -  Change in appetite 1 - -  Feeling bad or failure about yourself  0 - -  Trouble concentrating 0 - -  Moving slowly or fidgety/restless 1 - -  Suicidal thoughts 0 - -  PHQ-9 Score 3 - -  Difficult doing work/chores Not difficult at all - -   Interpretation of Total Score  Total Score Depression Severity:  1-4 = Minimal depression, 5-9 = Mild depression, 10-14 = Moderate depression, 15-19 = Moderately severe depression, 20-27 = Severe depression   Psychosocial Evaluation and  Intervention:  Psychosocial Evaluation - 04/16/22 1417       Psychosocial Evaluation & Interventions   Interventions Encouraged to exercise with the program and follow exercise prescription    Comments Mr. Heemstra is coming to cardiac rehab with the diagnosis of systolic heart failure. He mentioned a possible heart transplant in the future, so he wants to work on getting his weight down to at least 260lb and boost his stamina. He states his parents and his girlfriend are very supportive. 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 He reports he feels like he is handling all these medical issues okay and just takes it day by day.    Expected Outcomes Short: attend cardiac rehab for education and exercise. Long; develop and maintain positive self care habits.    Continue Psychosocial Services  Follow up required by staff             Psychosocial Re-Evaluation:  Psychosocial Re-Evaluation     [place] Name 05/14/22 1134 05/25/22 1125 07/11/22 1121         Psychosocial Re-Evaluation   Current issues with None Identified None Identified None Identified     Comments Herny is a happy, hungry and tired. He loves comedy and he strives to be positive. He loves to play video games with his child and is very happy doing so. Cedric is determined to get a heart transplant.Marland Kitchen He is staying positive and is ready to continue exercise. Other than his motivation getting more positive he has no mental health changes. He is staying positive and is ready to continue exercise. He relies on his girlfriend and parents for support. He reports his family and daughter keep him staying positive. He usually sleeps well, but when he was wearing the heart monitor it was waking him up as it was uncomfortable.     Expected Outcomes Short: Continue to exercise regularly to support mental health and notify staff of any changes. Long: maintain mental health and well being through  teaching of rehab or prescribed medications independently. Short: maintain a positive attitude throughout heartTrack. Long: continue to keep a positive outlook on his mental health. Short: maintain a positive attitude throughout heartTrack. Long: continue to keep a positive outlook on his mental  health.     Interventions Encouraged to attend Cardiac Rehabilitation for the exercise Encouraged to attend Cardiac Rehabilitation for the exercise Encouraged to attend Cardiac Rehabilitation for the exercise     Continue Psychosocial Services  Follow up required by staff Follow up required by staff Follow up required by staff              Psychosocial Discharge (Final Psychosocial Re-Evaluation):  Psychosocial Re-Evaluation - 07/11/22 1121       Psychosocial Re-Evaluation   Current issues with None Identified    Comments He is staying positive and is ready to continue exercise. He relies on his girlfriend and parents for support. He reports his family and daughter keep him staying positive. He usually sleeps well, but when he was wearing the heart monitor it was waking him up as it was uncomfortable.    Expected Outcomes Short: maintain a positive attitude throughout heartTrack. Long: continue to keep a positive outlook on his mental health.    Interventions Encouraged to attend Cardiac Rehabilitation for the exercise    Continue Psychosocial Services  Follow up required by staff             Vocational Rehabilitation: Provide vocational rehab assistance to qualifying candidates.   Vocational Rehab Evaluation & Intervention:  Vocational Rehab - 04/16/22 1410       Initial Vocational Rehab Evaluation & Intervention   Assessment shows need for Vocational Rehabilitation No             Education: Education Goals: Education classes will be provided on a variety of topics geared toward better understanding of heart health and risk factor modification. Participant will state  understanding/return demonstration of topics presented as noted by education test scores.  Learning Barriers/Preferences:  Learning Barriers/Preferences - 04/16/22 1410       Learning Barriers/Preferences   Learning Barriers Sight   left eye issues after stroke   Learning Preferences None             General Cardiac Education Topics:  AED/CPR: - Group verbal and written instruction with the use of models to demonstrate the basic use of the AED with the basic ABC's of resuscitation.   Anatomy and Cardiac Procedures: - Group verbal and visual presentation and models provide information about basic cardiac anatomy and function. Reviews the testing methods done to diagnose heart disease and the outcomes of the test results. Describes the treatment choices: Medical Management, Angioplasty, or Coronary Bypass Surgery for treating various heart conditions including Myocardial Infarction, Angina, Valve Disease, and Cardiac Arrhythmias.  Written material given at graduation. Flowsheet Row Cardiac Rehab from 06/20/2022 in [facility] Cardiac and Pulmonary Rehab  Education need identified 04/19/22  Date 05/09/22  Educator SB  Instruction Review Code 1- Verbalizes Understanding       Medication Safety: - Group verbal and visual instruction to review commonly prescribed medications for heart and lung disease. Reviews the medication, class of the drug, and side effects. Includes the steps to properly store meds and maintain the prescription regimen.  Written material given at graduation.   Intimacy: - Group verbal instruction through game format to discuss how heart and lung disease can affect sexual intimacy. Written material given at graduation.. Flowsheet Row Cardiac Rehab from 06/20/2022 in [facility] Cardiac and Pulmonary Rehab  Date 05/02/22  Educator [facility]  Instruction Review Code 1- Verbalizes Understanding       Know Your Numbers and Heart Failure: - Group verbal and visual instruction to  discuss disease  risk factors for cardiac and pulmonary disease and treatment options.  Reviews associated critical values for Overweight/Obesity, Hypertension, Cholesterol, and Diabetes.  Discusses basics of heart failure: signs/symptoms and treatments.  Introduces Heart Failure Zone chart for action plan for heart failure.  Written material given at graduation. Flowsheet Row Cardiac Rehab from 06/20/2022 in [facility] Cardiac and Pulmonary Rehab  Date 06/06/22  Educator SB  Instruction Review Code 1- Verbalizes Understanding       Infection Prevention: - Provides verbal and written material to individual with discussion of infection control including proper hand washing and proper equipment cleaning during exercise session. Flowsheet Row Cardiac Rehab from 06/20/2022 in [facility] Cardiac and Pulmonary Rehab  Education need identified 04/19/22  Date 04/19/22  Educator [place]  Instruction Review Code 1- Verbalizes Understanding       Falls Prevention: - Provides verbal and written material to individual with discussion of falls prevention and safety. Flowsheet Row Cardiac Rehab from 06/20/2022 in [facility] Cardiac and Pulmonary Rehab  Education need identified 04/19/22  Date 04/19/22  Educator [place]  Instruction Review Code 1- Verbalizes Understanding       Other: -Provides group and verbal instruction on various topics (see comments)   Knowledge Questionnaire Score:  Knowledge Questionnaire Score - 04/19/22 1400       Knowledge Questionnaire Score   Pre Score 22/26: Exercise, MI, Angina             Core Components/Risk Factors/Patient Goals at Admission:  Personal Goals and Risk Factors at Admission - 04/19/22 1630       Core Components/Risk Factors/Patient Goals on Admission    Weight Management Yes;Weight Loss;Obesity    Intervention Weight Management: Develop a combined nutrition and exercise program designed to reach desired caloric intake, while maintaining appropriate intake of  nutrient and fiber, sodium and fats, and appropriate energy expenditure required for the weight goal.;Weight Management: Provide education and appropriate resources to help participant work on and attain dietary goals.;Weight Management/Obesity: Establish reasonable short term and long term weight goals.;Obesity: Provide education and appropriate resources to help participant work on and attain dietary goals.    Admit Weight 321 lb (145.6 kg)    Goal Weight: Short Term 315 lb (142.9 kg)    Goal Weight: Long Term 260 lb (117.9 kg)    Expected Outcomes Long Term: Adherence to nutrition and physical activity/exercise program aimed toward attainment of established weight goal;Short Term: Continue to assess and modify interventions until short term weight is achieved;Weight Loss: Understanding of general recommendations for a balanced deficit meal plan, which promotes 1-2 lb weight loss per week and includes a negative energy balance of [phone] kcal/d;Understanding recommendations for meals to include 15-35% energy as protein, 25-35% energy from fat, 35-60% energy from carbohydrates, less than 276m of dietary cholesterol, 20-35 gm of total fiber daily;Understanding of distribution of calorie intake throughout the day with the consumption of 4-5 meals/snacks    Diabetes Yes    Intervention Provide education about signs/symptoms and action to take for hypo/hyperglycemia.;Provide education about proper nutrition, including hydration, and aerobic/resistive exercise prescription along with prescribed medications to achieve blood glucose in normal ranges: Fasting glucose 65-99 mg/dL    Expected Outcomes Short Term: Participant verbalizes understanding of the signs/symptoms and immediate care of hyper/hypoglycemia, proper foot care and importance of medication, aerobic/resistive exercise and nutrition plan for blood glucose control.;Long Term: Attainment of HbA1C < 7%.    Heart Failure Yes    Intervention Provide a  combined exercise and nutrition program that  is supplemented with education, support and counseling about heart failure. Directed toward relieving symptoms such as shortness of breath, decreased exercise tolerance, and extremity edema.    Expected Outcomes Improve functional capacity of life;Short term: Attendance in program 2-3 days a week with increased exercise capacity. Reported lower sodium intake. Reported increased fruit and vegetable intake. Reports medication compliance.;Short term: Daily weights obtained and reported for increase. Utilizing diuretic protocols set by physician.;Long term: Adoption of self-care skills and reduction of barriers for early signs and symptoms recognition and intervention leading to self-care maintenance.    Hypertension Yes    Intervention Provide education on lifestyle modifcations including regular physical activity/exercise, weight management, moderate sodium restriction and increased consumption of fresh fruit, vegetables, and low fat dairy, alcohol moderation, and smoking cessation.;Monitor prescription use compliance.    Expected Outcomes Short Term: Continued assessment and intervention until BP is < 140/2m HG in hypertensive participants. < 130/821mHG in hypertensive participants with diabetes, heart failure or chronic kidney disease.;Long Term: Maintenance of blood pressure at goal levels.    Lipids Yes    Intervention Provide education and support for participant on nutrition & aerobic/resistive exercise along with prescribed medications to achieve LDL <7058mHDL >47m37m  Expected Outcomes Short Term: Participant states understanding of desired cholesterol values and is compliant with medications prescribed. Participant is following exercise prescription and nutrition guidelines.;Long Term: Cholesterol controlled with medications as prescribed, with individualized exercise RX and with personalized nutrition plan. Value goals: LDL < 70mg60mL > 40 mg.              Education:Diabetes - Individual verbal and written instruction to review signs/symptoms of diabetes, desired ranges of glucose level fasting, after meals and with exercise. Acknowledge that pre and post exercise glucose checks will be done for 3 sessions at entry of program. [facility] 04/16/2022 in [facility]  Date 04/16/22  Educator [facility] In[facility] Review Code 1- Verbalizes Understanding       Core Components/Risk Factors/Patient Goals Review:   Goals and Risk Factor Review     Row Name 05/14/22 1124 05/25/22 1122 07/11/22 1116         Core Components/Risk Factors/Patient Goals Review   Personal Goals Review Weight Management/Obesity;Hypertension Weight Management/Obesity Weight Management/Obesity;Diabetes;Heart Failure;Hypertension     Review WanyaHarmble to check his blood pressure at home and has been good in the program. He checks his blood pressure at home routinely. He would like to lose more weight and gain more strength. WanyaJonuellost some more weight and wants to reach a weight goal of 240 pounds. He needs to be 260 pounds to get a new heart. He is determined to get his weight off. He has lost a few pounds since he last visited. WanyaSolonrts his BG has been stable around 140 in the morning. His Ozempic medication has been increased and he reports doing well with it. He reports his weight fluctuating up and down; he has made diet changes such as cooking more at home and limiting chinese food - he is not exercising outside of rehab right now. He continues to take his medications as prescribed with no issues. He notices that when he eats salty food, but he has been staying on top of his medications and weight at home. He checks his BP at home and doesn't feel it is accurate, encouraged him to bring it in so we can check it. Today his BP was  126/66. Jerrian was on a heart monitor for two weeks and just sent it in to his doctor today  - waiting on results.     Expected Outcomes Short: lose 5 pounds in a couple weeks. Long: reach weight goal. Short: lose more weight. Long: reach weight goal. Short: continue to monitor risk factors and work on behavior changes (diet and exercise) Long: reach weight goal.              Core Components/Risk Factors/Patient Goals at Discharge (Final Review):   Goals and Risk Factor Review - 07/11/22 1116       Core Components/Risk Factors/Patient Goals Review   Personal Goals Review Weight Management/Obesity;Diabetes;Heart Failure;Hypertension    Review Franco reports his BG has been stable around 140 in the morning. His Ozempic medication has been increased and he reports doing well with it. He reports his weight fluctuating up and down; he has made diet changes such as cooking more at home and limiting chinese food - he is not exercising outside of rehab right now. He continues to take his medications as prescribed with no issues. He notices that when he eats salty food, but he has been staying on top of his medications and weight at home. He checks his BP at home and doesn't feel it is accurate, encouraged him to bring it in so we can check it. Today his BP was 126/66. Steward was on a heart monitor for two weeks and just sent it in to his doctor today - waiting on results.    Expected Outcomes Short: continue to monitor risk factors and work on behavior changes (diet and exercise) Long: reach weight goal.             ITP Comments:  ITP Comments     Row Name 04/16/22 1420 04/19/22 1343 04/23/22 1113 05/16/22 0836 06/13/22 1233   ITP Comments Initial telephone orientation completed. Diagnosis can be found in [facility] 3/30. EP orientation scheduled for Thursday 4/30 at 9:30. Completed 6MWT and gym orientation. Initial ITP created and sent for review to Dr. Emily Filbert, Medical Director. First full day of exercise!  Patient was oriented to gym and equipment including functions, settings, policies,  and procedures.  Patient's individual exercise prescription and treatment plan were reviewed.  All starting workloads were established based on the results of the 6 minute walk test done at initial orientation visit.  The plan for exercise progression was also introduced and progression will be customized based on patient's performance and goals. 30 Day review completed. Medical Director ITP review done, changes made as directed, and signed approval by Medical Director.    NEW 30 Day review completed. Medical Director ITP review done, changes made as directed, and signed approval by Medical Director.    [place] Name 06/18/22 1206 06/27/22 1300 07/11/22 0808       ITP Comments Completed initial RD consultation Patient called to let us know that his doctor gave him a Zio patch to wear for 2 weeks and was told to be out of rehab until it is removed. He is supposed to take it off on 7/11, we'll plan for patient to return to rehab on 7/12  with clearance or unless his doctors says otherwise based on results. 30 Day review completed. Medical Director ITP review done, changes made as directed, and signed approval by Medical Director.              Comments: 30 day goal update.

## 2022-07-11 NOTE — Progress Notes (Signed)
Cardiac Individual Treatment Plan  Patient Details  Name: Robert Mccann MRN: [id number] Date of Birth: June 10, 1996 Referring Provider:   Flowsheet Row Cardiac Rehab from 04/19/2022 in [facility] Cardiac and Pulmonary Rehab  Referring Provider Glori Bickers MD       Initial Encounter Date:  Flowsheet Row Cardiac Rehab from 04/19/2022 in [facility] Cardiac and Pulmonary Rehab  Date 04/19/22       Visit Diagnosis: Heart failure, chronic systolic ([place])  Patient's Home Medications on Admission:  Current Outpatient Medications:    apixaban (ELIQUIS) 5 MG TABS tablet, Take 1 tablet (5 mg total) by mouth 2 (two) times daily., Disp: 60 tablet, Rfl: 6   Bempedoic Acid-Ezetimibe (NEXLIZET) 180-10 MG TABS, Take 1 tablet by mouth daily., Disp: 90 tablet, Rfl: 3   Blood Glucose Monitoring Suppl (BLOOD GLUCOSE MONITOR SYSTEM) w/Device KIT, Use up to four times daily as directed. (Patient not taking: Reported on 06/26/2022), Disp: 1 kit, Rfl: 3   digoxin (LANOXIN) 0.125 MG tablet, Take 1 tablet (0.125 mg total) by mouth daily., Disp: 30 tablet, Rfl: 6   FARXIGA 10 MG TABS tablet, Take 10 mg by mouth daily., Disp: , Rfl:    furosemide (LASIX) 20 MG tablet, Take 4 tablets (80 mg total) by mouth daily., Disp: 120 tablet, Rfl: 6   metFORMIN (GLUCOPHAGE) 500 MG tablet, Take 1 tablet (500 mg total) by mouth 2 (two) times daily with a meal., Disp: 60 tablet, Rfl: 0   potassium chloride (KLOR-CON) 20 MEQ packet, Take 60 mEq by mouth daily., Disp: 90 packet, Rfl: 6   rosuvastatin (CRESTOR) 40 MG tablet, Take 1 tablet (40 mg total) by mouth daily., Disp: 90 tablet, Rfl: 3   sacubitril-valsartan (ENTRESTO) 97-103 MG, Take 1 tablet by mouth 2 (two) times daily., Disp: 60 tablet, Rfl: 6   Semaglutide, 2 MG/DOSE, (OZEMPIC, 2 MG/DOSE,) 8 MG/3ML SOPN, Inject 2 mg into the skin once a week., Disp: 3 mL, Rfl: 11   spironolactone (ALDACTONE) 25 MG tablet, Take 1 tablet (25 mg total) by mouth daily., Disp: 30 tablet, Rfl:  6  Past Medical History: Past Medical History:  Diagnosis Date   AV block, [place]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 05/2021 noted during periods of sleep @ time of hospitalization for PE.   Cardiomyopathy ([place])    a. 05/2021 Echo: EF <20%, glob HK. [place]:C ratio of 2.1:1 suggesting non-compaction. Gr2 DD. Sev reduced RV fxn. Nl PASP. Mildly dil RA. Mild MR; b. 10/2021 Echo: EF <20%, GrII DD; c. 10/2021 TEE: EF 20-25%, glob HK, sev red RV fxn.   Diabetes mellitus without complication ([place])    HFrEF (heart failure with reduced ejection fraction) ([place])    a. 05/2021 Echo: EF <20%. ? non-compaction.   Hypertension    Pulmonary embolism ([place])    a. 05/2021 CTA Chest: PE extending from lobar arteris of RUL and RLL w/ concern for pulml infarct-->s/p thrombolysis and thrombectomy of RUL/RML/RLL.   Stroke [facility])    a. 10/2021 CT Head: Bilat cerebral infarcts w/ mod-sided acute to subacute R temporoparietal infarct; b. 10/2021 MRI: R post PCA territory infarcts w/o hemorrhage; c. 10/2021 TEE: Neg bubble study. No LA/LAA thrombus. EF 20-25%, glob HK.    Tobacco Use: Social History   Tobacco Use  Smoking Status Never  Smokeless Tobacco Never    Labs: Review Flowsheet  More data exists      Latest Ref Rng & Units 03/19/2022 03/20/2022 03/21/2022 04/30/2022 06/12/2022  Labs for ITP Cardiac and Pulmonary Rehab  Cholestrol 100 - 199 mg/dL - - - 154  98   LDL (calc) 0 - 99 mg/dL - - - 80  39   HDL-C >39 mg/dL - - - 40  38   Trlycerides 0 - 149 mg/dL - - - 199  116   O2 Saturation % 71.7  86.7  68.4  - -     Exercise Target Goals: Exercise Program Goal: Individual exercise prescription set using results from initial 6 min walk test and THRR while considering  patient's activity barriers and safety.   Exercise Prescription Goal: Initial exercise prescription builds to 30-45 minutes a day of aerobic activity, 2-3 days per week.  Home exercise guidelines will be given to patient during program as part of exercise prescription that  the participant will acknowledge.   Education: Aerobic Exercise: - Group verbal and visual presentation on the components of exercise prescription. Introduces F.I.T.T principle from ACSM for exercise prescriptions.  Reviews F.I.T.T. principles of aerobic exercise including progression. Written material given at graduation. Flowsheet Row Cardiac Rehab from 06/20/2022 in [facility] Cardiac and Pulmonary Rehab  Education need identified 04/19/22  Date 05/02/22  Educator [employer]  Instruction Review Code 1- [employer] Understanding       Education: Resistance Exercise: - Group verbal and visual presentation on the components of exercise prescription. Introduces F.I.T.T principle from ACSM for exercise prescriptions  Reviews F.I.T.T. principles of resistance exercise including progression. Written material given at graduation. Flowsheet Row Cardiac Rehab from 06/20/2022 in [facility] Cardiac and Pulmonary Rehab  Date 05/09/22  Educator [facility]  Instruction Review Code 1- Verbalizes Understanding        Education: Exercise & Equipment Safety: - Individual verbal instruction and demonstration of equipment use and safety with use of the equipment. Flowsheet Row Cardiac Rehab from 06/20/2022 in [facility] Cardiac and Pulmonary Rehab  Education need identified 04/19/22  Date 04/19/22  Educator [place]  Instruction Review Code 1- Verbalizes Understanding       Education: Exercise Physiology & General Exercise Guidelines: - Group verbal and written instruction with models to review the exercise physiology of the cardiovascular system and associated critical values. Provides general exercise guidelines with specific guidelines to those with heart or lung disease.  Flowsheet Row Cardiac Rehab from 06/20/2022 in [facility] Cardiac and Pulmonary Rehab  Education need identified 04/19/22       Education: Flexibility, Balance, Mind/Body Relaxation: - Group verbal and visual presentation with interactive activity on the components of  exercise prescription. Introduces F.I.T.T principle from ACSM for exercise prescriptions. Reviews F.I.T.T. principles of flexibility and balance exercise training including progression. Also discusses the mind body connection.  Reviews various relaxation techniques to help reduce and manage stress (i.e. Deep breathing, progressive muscle relaxation, and visualization). Balance handout provided to take home. Written material given at graduation. Flowsheet Row Cardiac Rehab from 06/20/2022 in [facility] Cardiac and Pulmonary Rehab  Date 05/16/22  Educator [facility]  Instruction Review Code 1- Verbalizes Understanding       Activity Barriers & Risk Stratification:  Activity Barriers & Cardiac Risk Stratification - 04/19/22 1617       Activity Barriers & Cardiac Risk Stratification   Activity Barriers Decreased Ventricular Function;Deconditioning;Muscular Weakness    Comments calf muscle left leg (MD aware)    Cardiac Risk Stratification High             6 Minute Walk:  6 Minute Walk     Row Name 04/19/22 1617         6 Minute  Walk   Phase Initial     Distance 1020 feet     Walk Time 6 minutes     # of Rest Breaks 0     MPH 1.93     METS 3.88     RPE 9     Perceived Dyspnea  0     VO2 Peak 13.6     Symptoms Yes (comment)     Comments Left calf pain 3/10     Resting HR 92 bpm     Resting BP 104/70     Resting Oxygen Saturation  98 %     Exercise Oxygen Saturation  during 6 min walk 98 %     Max Ex. HR 119 bpm     Max Ex. BP 132/74     2 Minute Post BP 108/78              Oxygen Initial Assessment:   Oxygen Re-Evaluation:   Oxygen Discharge (Final Oxygen Re-Evaluation):   Initial Exercise Prescription:  Initial Exercise Prescription - 04/19/22 1600       Date of Initial Exercise RX and Referring Provider   Date 04/19/22    Referring Provider Glori Bickers MD      Oxygen   Maintain Oxygen Saturation 88% or higher      NuStep   Level 1    SPM 80     Minutes 15    METs 3.8      REL-XR   Level 1    Speed 50    Minutes 15    METs 3.8      T5 Nustep   Level 1    SPM 80    Minutes 15    METs 3.8      Track   Laps 21   As tolerated - take breaks as needed   Minutes 15    METs 2.14      Prescription Details   Frequency (times per week) 3    Duration Progress to 30 minutes of continuous aerobic without signs/symptoms of physical distress      Intensity   THRR 40-80% of Max Heartrate 132 - 162    Ratings of Perceived Exertion 11-13    Perceived Dyspnea 0-4      Progression   Progression Continue to progress workloads to maintain intensity without signs/symptoms of physical distress.      Resistance Training   Training Prescription Yes    Weight 3 lb    Reps 10-15             Perform Capillary Blood Glucose checks as needed.  Exercise Prescription Changes:   Exercise Prescription Changes     Row Name 04/19/22 1600 04/24/22 1600 05/08/22 1500 05/14/22 1100 05/21/22 1600     Response to Exercise   Blood Pressure (Admit) 104/70 110/78 110/64 -- 116/72   Blood Pressure (Exercise) 132/74 122/78 158/62 -- 124/82   Blood Pressure (Exit) 108/78 104/74 104/60 -- 106/64   Heart Rate (Admit) 92 bpm 93 bpm 103 bpm -- 96 bpm   Heart Rate (Exercise) 119 bpm 122 bpm 133 bpm -- 114 bpm   Heart Rate (Exit) 96 bpm 99 bpm 96 bpm -- 87 bpm   Oxygen Saturation (Admit) 98 % -- -- -- --   Oxygen Saturation (Exercise) 98 % -- -- -- --   Rating of Perceived Exertion (Exercise) _0 -- 11   Perceived Dyspnea (Exercise) 0 -- -- -- --  Symptoms Left calf pain 3/10 none none -- none   Comments walk test results first full day of exercise -- -- --   Duration -- Progress to 30 minutes of  aerobic without signs/symptoms of physical distress Continue with 30 min of aerobic exercise without signs/symptoms of physical distress. -- Continue with 30 min of aerobic exercise without signs/symptoms of physical distress.   Intensity -- THRR  unchanged THRR unchanged -- THRR unchanged     Progression   Progression -- Continue to progress workloads to maintain intensity without signs/symptoms of physical distress. Continue to progress workloads to maintain intensity without signs/symptoms of physical distress. -- Continue to progress workloads to maintain intensity without signs/symptoms of physical distress.   Average METs -- 2.07 2.5 -- 2.66     Resistance Training   Training Prescription -- Yes Yes -- Yes   Weight -- 3 lb 3 lb -- 7 lb   Reps -- 10-15 10-15 -- 10-15     Interval Training   Interval Training -- No No -- No     Treadmill   MPH -- -- 1.5 -- 1.7   Grade -- -- 2.5 -- 2.5   Minutes -- -- 15 -- 15   METs -- -- 2.67 -- 2.89     NuStep   Level -- 1 3 -- 4   Minutes -- 15 15 -- 15   METs -- 2 2.5 -- 3.5     Recumbant Elliptical   Level -- -- 2 -- 3   Minutes -- -- 15 -- 15   METs -- -- -- -- 1.6     Track   Laps -- 21 21 -- --   Minutes -- 15 15 -- --   METs -- 2.14 2.14 -- --     Home Exercise Plan   Plans to continue exercise at -- -- -- Home (comment)  walking and bike Home (comment)  walking and bike   Frequency -- -- -- Add 2 additional days to program exercise sessions. Add 2 additional days to program exercise sessions.   Initial Home Exercises Provided -- -- -- 05/14/22 05/14/22     Oxygen   Maintain Oxygen Saturation -- 88% or higher 88% or higher -- 88% or higher    Row Name 06/04/22 1000 06/18/22 1400 07/02/22 0800         Response to Exercise   Blood Pressure (Admit) 104/62 124/70 122/62     Blood Pressure (Exercise) 134/60 -- --     Blood Pressure (Exit) 100/56 110/62 118/62     Heart Rate (Admit) 86 bpm 87 bpm 82 bpm     Heart Rate (Exercise) 145 bpm 108 bpm 119 bpm     Heart Rate (Exit) 108 bpm 89 bpm 93 bpm     Rating of Perceived Exertion (Exercise) _0 Symptoms none none none     Duration Continue with 30 min of aerobic exercise without signs/symptoms of physical  distress. Continue with 30 min of aerobic exercise without signs/symptoms of physical distress. Continue with 30 min of aerobic exercise without signs/symptoms of physical distress.     Intensity THRR unchanged THRR unchanged THRR unchanged       Progression   Progression Continue to progress workloads to maintain intensity without signs/symptoms of physical distress. Continue to progress workloads to maintain intensity without signs/symptoms of physical distress. Continue to progress workloads to maintain intensity without signs/symptoms of physical distress.  Average METs 3.74 3.07 2.65       Resistance Training   Training Prescription Yes Yes Yes     Weight 7 lb 7 lb 7 lb     Reps 10-15 10-15 10-15       Interval Training   Interval Training Yes No No     Equipment Treadmill -- --     Comments up to 4% incline -- --       Treadmill   MPH 1.7 2.4 2.5     Grade 4  intervals 4 1     Minutes _0 METs 3.24 4.16 3.26       NuStep   Level _1 Minutes _2 METs 4.1 2.3 2.2       Recumbant Elliptical   Level -- 2.5 --     Minutes -- 15 --     METs -- 3.2 --       REL-XR   Level -- 6 --     Minutes -- 15 --     METs -- 2.1 --       Track   Laps -- 17 17     Minutes -- 15 15     METs -- 1.92 1.92       Home Exercise Plan   Plans to continue exercise at Home (comment)  walking and bike Home (comment)  walking and bike Home (comment)  walking and bike     Frequency Add 2 additional days to program exercise sessions. Add 2 additional days to program exercise sessions. Add 2 additional days to program exercise sessions.     Initial Home Exercises Provided 05/14/22 05/14/22 05/14/22       Oxygen   Maintain Oxygen Saturation 88% or higher 88% or higher 88% or higher              Exercise Comments:   Exercise Goals and Review:   Exercise Goals     Row Name 04/19/22 1628             Exercise Goals   Increase Physical Activity Yes        Intervention Provide advice, education, support and counseling about physical activity/exercise needs.;Develop an individualized exercise prescription for aerobic and resistive training based on initial evaluation findings, risk stratification, comorbidities and participant's personal goals.       Expected Outcomes Short Term: Attend rehab on a regular basis to increase amount of physical activity.;Long Term: Add in home exercise to make exercise part of routine and to increase amount of physical activity.;Long Term: Exercising regularly at least 3-5 days a week.       Increase Strength and Stamina Yes       Intervention Provide advice, education, support and counseling about physical activity/exercise needs.;Develop an individualized exercise prescription for aerobic and resistive training based on initial evaluation findings, risk stratification, comorbidities and participant's personal goals.       Expected Outcomes Short Term: Increase workloads from initial exercise prescription for resistance, speed, and METs.;Short Term: Perform resistance training exercises routinely during rehab and add in resistance training at home;Long Term: Improve cardiorespiratory fitness, muscular endurance and strength as measured by increased METs and functional capacity (6MWT)       Able to understand and use rate of perceived exertion (RPE) scale Yes       Intervention Provide education and explanation on how to use  RPE scale       Expected Outcomes Short Term: Able to use RPE daily in rehab to express subjective intensity level;Long Term:  Able to use RPE to guide intensity level when exercising independently       Able to understand and use Dyspnea scale Yes       Intervention Provide education and explanation on how to use Dyspnea scale       Expected Outcomes Short Term: Able to use Dyspnea scale daily in rehab to express subjective sense of shortness of breath during exertion;Long Term: Able to use Dyspnea scale to  guide intensity level when exercising independently       Knowledge and understanding of Target Heart Rate Range (THRR) Yes       Intervention Provide education and explanation of THRR including how the numbers were predicted and where they are located for reference       Expected Outcomes Short Term: Able to state/look up THRR;Long Term: Able to use THRR to govern intensity when exercising independently;Short Term: Able to use daily as guideline for intensity in rehab       Able to check pulse independently Yes       Intervention Provide education and demonstration on how to check pulse in carotid and radial arteries.;Review the importance of being able to check your own pulse for safety during independent exercise       Expected Outcomes Short Term: Able to explain why pulse checking is important during independent exercise;Long Term: Able to check pulse independently and accurately       Understanding of Exercise Prescription Yes       Intervention Provide education, explanation, and written materials on patient's individual exercise prescription       Expected Outcomes Short Term: Able to explain program exercise prescription;Long Term: Able to explain home exercise prescription to exercise independently                Exercise Goals Re-Evaluation :  Exercise Goals Re-Evaluation     Row Name 04/23/22 1114 05/08/22 1607 05/14/22 1120 05/21/22 1612 06/04/22 1041     Exercise Goal Re-Evaluation   Exercise Goals Review Increase Physical Activity;Able to understand and use rate of perceived exertion (RPE) scale;Knowledge and understanding of Target Heart Rate Range (THRR);Understanding of Exercise Prescription;Increase Strength and Stamina;Able to check pulse independently Increase Physical Activity;Understanding of Exercise Prescription;Increase Strength and Stamina Increase Physical Activity;Understanding of Exercise Prescription;Increase Strength and Stamina;Able to understand and use rate  of perceived exertion (RPE) scale;Able to understand and use Dyspnea scale;Knowledge and understanding of Target Heart Rate Range (THRR);Able to check pulse independently Increase Physical Activity;Increase Strength and Stamina;Understanding of Exercise Prescription Increase Physical Activity;Increase Strength and Stamina;Understanding of Exercise Prescription   Comments Reviewed RPE and dyspnea scales, THR and program prescription with pt today.  Pt voiced understanding and was given a copy of goals to take home. Mackenzie has been doing well during his first few weeks in rehab. He improved his average METs to 2.5. He increased to Level 3 on the T4NS machine. He has begun to hit his THR while performing exercise. We advised pt to start with lower RPEs, he will now increase RPEs to normal level. We will continue to monitor pt as he progresses in the rehab program. Reviewed home exercise with pt today.  Pt plans to walk and use stationary bike at home for exercise.  Reviewed THR, pulse, RPE, sign and symptoms, pulse oximetery and when to call 911 or MD.  Also discussed weather considerations and indoor options.  Pt voiced understanding. Latasha is doing well in rehab.  We have increased his RPE up to normal levels as he has not had symptoms in rehab. His weight has been on a downward trend as well.  He is up to level 3 on the recumbent elliptical.  We will continue to monitor his progress. Raiyan continues to do well in rehab. He has been out the last week but was still able to improve on the T4 Nustep to level 5. He also started intervals on the treadmill working up to 4% incline.  He is also now up to 7 lbs for handweights. Will continue to monitor.   Expected Outcomes Short: Use RPE daily to regulate intensity. Long: Follow program prescription in THR. Short: increase RPEs during exercise. Long: Follow program to improve strength and stamina. Short: Start to add in exercise at home Long: Conitnue to exercise  independently short: Try 2 mph on treadmill Long: continue to improve stamina and work on weight loss Short: Continue to increase workloads on treadmill Long: Continue to increase overall MET level    Row Name 06/18/22 1429 07/02/22 0817           Exercise Goal Re-Evaluation   Exercise Goals Review Increase Physical Activity;Increase Strength and Stamina;Understanding of Exercise Prescription Increase Physical Activity;Increase Strength and Stamina;Understanding of Exercise Prescription      Comments Bakari is still doing well in rehab. He tried out the XR at level 6 and tolerated it well. He continues to work on the treadmill with a 4% incline. He hits his THR some sessions. He would benefit from increasing his level on the REL. Will continue to monitor. Jhalil is doing well in rehab. He improved his speed on the treadmill to 2.5 mph. He has also done well with 7 lb hand weights for resistance training. We will continue to monitor his progress in the program.      Expected Outcomes Short: Increase level on REL Long: Continue to build up overall strength and stamina Short: add incline back on treadmill. Long: Continue to improve overall MET level.               Discharge Exercise Prescription (Final Exercise Prescription Changes):  Exercise Prescription Changes - 07/02/22 0800       Response to Exercise   Blood Pressure (Admit) 122/62    Blood Pressure (Exit) 118/62    Heart Rate (Admit) 82 bpm    Heart Rate (Exercise) 119 bpm    Heart Rate (Exit) 93 bpm    Rating of Perceived Exertion (Exercise) 11    Symptoms none    Duration Continue with 30 min of aerobic exercise without signs/symptoms of physical distress.    Intensity THRR unchanged      Progression   Progression Continue to progress workloads to maintain intensity without signs/symptoms of physical distress.    Average METs 2.65      Resistance Training   Training Prescription Yes    Weight 7 lb    Reps 10-15       Interval Training   Interval Training No      Treadmill   MPH 2.5    Grade 1    Minutes 15    METs 3.26      NuStep   Level 4    Minutes 15    METs 2.2      Track   Laps 17    Minutes 15  METs 1.92      Home Exercise Plan   Plans to continue exercise at Home (comment)   walking and bike   Frequency Add 2 additional days to program exercise sessions.    Initial Home Exercises Provided 05/14/22      Oxygen   Maintain Oxygen Saturation 88% or higher             Nutrition:  Target Goals: Understanding of nutrition guidelines, daily intake of sodium <1549m, cholesterol <2096m calories 30% from fat and 7% or less from saturated fats, daily to have 5 or more servings of fruits and vegetables.  Education: All About Nutrition: -Group instruction provided by verbal, written material, interactive activities, discussions, models, and posters to present general guidelines for heart healthy nutrition including fat, fiber, MyPlate, the role of sodium in heart healthy nutrition, utilization of the nutrition label, and utilization of this knowledge for meal planning. Follow up email sent as well. Written material given at graduation. Flowsheet [facility]  Date 05/23/22  [facility] 1- Verbalizes Understanding       Biometrics:  Pre Biometrics - 04/19/22 1616       Pre Biometrics   Height _0  (1.803 m)    Weight 321 lb 3.2 oz (145.7 kg)    BMI (Calculated) 44.82              Nutrition Therapy Plan and Nutrition Goals:  Nutrition Therapy & Goals - 06/18/22 1148       Nutrition Therapy   Diet Heart healthy, low Na, T2DM MNT    Drug/Food Interactions Statins/Certain Fruits    Protein (specify units) 80g   used adjusted body weight   Fiber 30 grams    Whole Grain Foods 3 servings    Saturated Fats 16 max. grams    Fruits and Vegetables 8 servings/day    Sodium 2 grams       Personal Nutrition Goals   Nutrition Goal ST: limit sugar sweetened beverages, eat consistently during the day, practice MyPlate guidleines, refrigerate rice from [employer] place to have a better BG response  LT: meet nutritional needs while persuing weight loss, follow MyPlate guidelines, increase variety of foods, limit added sugar <36g/day    Comments [age].o. M admitted to cardiac rehab chronic systolic HF. PMHx includes HTN, NSTEMI, HFrEF, T2DM, HLD. Relevant medications includes biotin, metformin, farxiga, K+, crestor, ozempic. Latest A1C = 8.9%. PYP Score: 56. Vegetables & Fruits 9/12. Breads, Grains & Cereals 10/12. Red & Processed Meat 8/12. Poultry 2/2. Fish & Shellfish 2/4. Beans, Nuts & Seeds 4/4. Milk & Dairy Foods 2/6. Toppings, Oils, Seasonings & Salt 5/20. Sweets, Snacks & Restaurant Food 8/14. Beverages 6/10.  Samaad typically eats 2 meals per day: splits chinese food of shrimp and broccoli with white rice. Sometimes cereal - captain crunch, honey bunches of oats with whole milk. He drinks juice and water during the day - he has also bought trulemon to flavor his water. The ozempic has reduced his appetite as well as the weather. He reports that he has belching with foul odor - discussed how this could be due to reflux or certain medications, recommended to discuss this with his doctor. He also reports 20lb weight fluctuations in short amount of time - discussed how this was likely fluid and to tell his doctor- also advised to limit restaurant food as this can be very high in salt whcih  can worsen fluid retention. Discussed T2DM MNT as well as heart healhty eating. Etai would like to lose weight, but is having trouble doing so - discussed body's compensatory mechanisms and encouraged to focus on eating consistently, limit excess calories from sugar-sweetened beverages, following MyPlate guidelines for proportions of foods as well as encouraging more nutrient dense foods. Provided some calorie  estimations, but emphasized as a starting point as calorie needs and weight management are more complicated - advised to not eat below his BMR.      Intervention Plan   Intervention Prescribe, educate and counsel regarding individualized specific dietary modifications aiming towards targeted core components such as weight, hypertension, lipid management, diabetes, heart failure and other comorbidities.    Expected Outcomes Short Term Goal: Understand basic principles of dietary content, such as calories, fat, sodium, cholesterol and nutrients.;Short Term Goal: A plan has been developed with personal nutrition goals set during dietitian appointment.;Long Term Goal: Adherence to prescribed nutrition plan.             Nutrition Assessments:  MEDIFICTS Score Key: ?70 Need to make dietary changes  40-70 Heart Healthy Diet ? 40 Therapeutic Level Cholesterol Diet  Flowsheet Row Cardiac Rehab from 04/19/2022 in [facility] Cardiac and Pulmonary Rehab  Picture Your Plate Total Score on Admission 56      Picture Your Plate Scores: <74 Unhealthy dietary pattern with much room for improvement. 41-50 Dietary pattern unlikely to meet recommendations for good health and room for improvement. 51-60 More healthful dietary pattern, with some room for improvement.  >60 Healthy dietary pattern, although there may be some specific behaviors that could be improved.    Nutrition Goals Re-Evaluation:  Nutrition Goals Re-Evaluation     [place] Name 05/14/22 1140 05/21/22 1149           Goals   Current Weight 319 lb (144.7 kg) --      Nutrition Goal Lose more weight., Cut back on fruit juice. --      Comment Naitik states that he loves juice. Informed him that it has alot of sugar and extra calories. He understands that he may be able to lose some weight with cutting out some of his juice intake. Gave him some guidance on other drinks to consider. Winston rescheduled nutrition appt. for 06/18/22.      Expected  Outcome Short: cut back on sugary juices. Long: adhere to a diet that pertains to him. --               Nutrition Goals Discharge (Final Nutrition Goals Re-Evaluation):  Nutrition Goals Re-Evaluation - 05/21/22 1149       Goals   Comment Maleik rescheduled nutrition appt. for 06/18/22.             Psychosocial: Target Goals: Acknowledge presence or absence of significant depression and/or stress, maximize coping skills, provide positive support system. Participant is able to verbalize types and ability to use techniques and skills needed for reducing stress and depression.   Education: Stress, Anxiety, and Depression - Group verbal and visual presentation to define topics covered.  Reviews how body is impacted by stress, anxiety, and depression.  Also discusses healthy ways to reduce stress and to treat/manage anxiety and depression.  Written material given at graduation. Flowsheet Row Cardiac Rehab from 06/20/2022 in [facility] Cardiac and Pulmonary Rehab  Date 06/20/22  Educator [facility]  Instruction Review Code 1- [employer] Understanding       Education: Sleep Hygiene -Provides group verbal and written instruction  about how sleep can affect your health.  Define sleep hygiene, discuss sleep cycles and impact of sleep habits. Review good sleep hygiene tips.    Initial Review & Psychosocial Screening:  Initial Psych Review & Screening - 04/16/22 1412       Initial Review   Current issues with None Identified      Family Dynamics   Good Support System? Yes   parents and girlfriend     Barriers   Psychosocial barriers to participate in program There are no identifiable barriers or psychosocial needs.;The patient should benefit from training in stress management and relaxation.      Screening Interventions   Interventions Encouraged to exercise;Provide feedback about the scores to participant;To provide support and resources with identified psychosocial needs    Expected Outcomes  Short Term goal: Utilizing psychosocial counselor, staff and physician to assist with identification of specific Stressors or current issues interfering with healing process. Setting desired goal for each stressor or current issue identified.;Long Term Goal: Stressors or current issues are controlled or eliminated.;Short Term goal: Identification and review with participant of any Quality of Life or Depression concerns found by scoring the questionnaire.;Long Term goal: The participant improves quality of Life and PHQ9 Scores as seen by post scores and/or verbalization of changes             Quality of Life Scores:   Quality of Life - 04/19/22 1401       Quality of Life   Select Quality of Life      Quality of Life Scores   Health/Function Pre 23.3 %    Socioeconomic Pre 25 %    Psych/Spiritual Pre 26.79 %    Family Pre 18.5 %    GLOBAL Pre 23.7 %            Scores of 19 and below usually indicate a poorer quality of life in these areas.  A difference of  2-3 points is a clinically meaningful difference.  A difference of 2-3 points in the total score of the Quality of Life Index has been associated with significant improvement in overall quality of life, self-image, physical symptoms, and general health in studies assessing change in quality of life.  PHQ-9: Review Flowsheet       04/19/2022 01/05/2022 07/11/2021  Depression screen PHQ 2/9  Decreased Interest 0 0 0  Down, Depressed, Hopeless 0 0 0  PHQ - 2 Score 0 0 0  Altered sleeping 0 - -  Tired, decreased energy 1 - -  Change in appetite 1 - -  Feeling bad or failure about yourself  0 - -  Trouble concentrating 0 - -  Moving slowly or fidgety/restless 1 - -  Suicidal thoughts 0 - -  PHQ-9 Score 3 - -  Difficult doing work/chores Not difficult at all - -   Interpretation of Total Score  Total Score Depression Severity:  1-4 = Minimal depression, 5-9 = Mild depression, 10-14 = Moderate depression, 15-19 = Moderately  severe depression, 20-27 = Severe depression   Psychosocial Evaluation and Intervention:  Psychosocial Evaluation - 04/16/22 1417       Psychosocial Evaluation & Interventions   Interventions Encouraged to exercise with the program and follow exercise prescription    Comments Mr. Lopp is coming to cardiac rehab with the diagnosis of systolic heart failure. He mentioned a possible heart transplant in the future, so he wants to work on getting his weight down to at least 260lb and boost  his stamina. He states his parents and his girlfriend are very supportive. 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 He reports he feels like he is handling all these medical issues okay and just takes it day by day.    Expected Outcomes Short: attend cardiac rehab for education and exercise. Long; develop and maintain positive self care habits.    Continue Psychosocial Services  Follow up required by staff             Psychosocial Re-Evaluation:  Psychosocial Re-Evaluation     [place] Name 05/14/22 1134 05/25/22 1125           Psychosocial Re-Evaluation   Current issues with None Identified None Identified      Comments Shloimy is a happy, hungry and tired. He loves comedy and he strives to be positive. He loves to play video games with his child and is very happy doing so. Lenon is determined to get a heart transplant.Marland Kitchen He is staying positive and is ready to continue exercise. Other than his motivation getting more positive he has no mental health changes.      Expected Outcomes Short: Continue to exercise regularly to support mental health and notify staff of any changes. Long: maintain mental health and well being through teaching of rehab or prescribed medications independently. Short: maintain a positive attitude throughout heartTrack. Long: continue to keep a positive outlook on his mental health.      Interventions Encouraged to attend Cardiac  Rehabilitation for the exercise Encouraged to attend Cardiac Rehabilitation for the exercise      Continue Psychosocial Services  Follow up required by staff Follow up required by staff               Psychosocial Discharge (Final Psychosocial Re-Evaluation):  Psychosocial Re-Evaluation - 05/25/22 1125       Psychosocial Re-Evaluation   Current issues with None Identified    Comments Nicolus is determined to get a heart transplant.Marland Kitchen He is staying positive and is ready to continue exercise. Other than his motivation getting more positive he has no mental health changes.    Expected Outcomes Short: maintain a positive attitude throughout heartTrack. Long: continue to keep a positive outlook on his mental health.    Interventions Encouraged to attend Cardiac Rehabilitation for the exercise    Continue Psychosocial Services  Follow up required by staff             Vocational Rehabilitation: Provide vocational rehab assistance to qualifying candidates.   Vocational Rehab Evaluation & Intervention:  Vocational Rehab - 04/16/22 1410       Initial Vocational Rehab Evaluation & Intervention   Assessment shows need for Vocational Rehabilitation No             Education: Education Goals: Education classes will be provided on a variety of topics geared toward better understanding of heart health and risk factor modification. Participant will state understanding/return demonstration of topics presented as noted by education test scores.  Learning Barriers/Preferences:  Learning Barriers/Preferences - 04/16/22 1410       Learning Barriers/Preferences   Learning Barriers Sight   left eye issues after stroke   Learning Preferences None             General Cardiac Education Topics:  AED/CPR: - Group verbal and written instruction with the use of models to demonstrate the basic use of the AED with the basic ABC's  of resuscitation.   Anatomy and Cardiac Procedures: - Group  verbal and visual presentation and models provide information about basic cardiac anatomy and function. Reviews the testing methods done to diagnose heart disease and the outcomes of the test results. Describes the treatment choices: Medical Management, Angioplasty, or Coronary Bypass Surgery for treating various heart conditions including Myocardial Infarction, Angina, Valve Disease, and Cardiac Arrhythmias.  Written material given at graduation. Flowsheet Row Cardiac Rehab from 06/20/2022 in [facility] Cardiac and Pulmonary Rehab  Education need identified 04/19/22  Date 05/09/22  Educator SB  Instruction Review Code 1- Verbalizes Understanding       Medication Safety: - Group verbal and visual instruction to review commonly prescribed medications for heart and lung disease. Reviews the medication, class of the drug, and side effects. Includes the steps to properly store meds and maintain the prescription regimen.  Written material given at graduation.   Intimacy: - Group verbal instruction through game format to discuss how heart and lung disease can affect sexual intimacy. Written material given at graduation.. Flowsheet Row Cardiac Rehab from 06/20/2022 in [facility] Cardiac and Pulmonary Rehab  Date 05/02/22  Educator [facility]  Instruction Review Code 1- Verbalizes Understanding       Know Your Numbers and Heart Failure: - Group verbal and visual instruction to discuss disease risk factors for cardiac and pulmonary disease and treatment options.  Reviews associated critical values for Overweight/Obesity, Hypertension, Cholesterol, and Diabetes.  Discusses basics of heart failure: signs/symptoms and treatments.  Introduces Heart Failure Zone chart for action plan for heart failure.  Written material given at graduation. Flowsheet Row Cardiac Rehab from 06/20/2022 in [facility] Cardiac and Pulmonary Rehab  Date 06/06/22  Educator SB  Instruction Review Code 1- Verbalizes Understanding       Infection  Prevention: - Provides verbal and written material to individual with discussion of infection control including proper hand washing and proper equipment cleaning during exercise session. Flowsheet Row Cardiac Rehab from 06/20/2022 in [facility] Cardiac and Pulmonary Rehab  Education need identified 04/19/22  Date 04/19/22  Educator [place]  Instruction Review Code 1- Verbalizes Understanding       Falls Prevention: - Provides verbal and written material to individual with discussion of falls prevention and safety. Flowsheet Row Cardiac Rehab from 06/20/2022 in [facility] Cardiac and Pulmonary Rehab  Education need identified 04/19/22  Date 04/19/22  Educator [place]  Instruction Review Code 1- Verbalizes Understanding       Other: -Provides group and verbal instruction on various topics (see comments)   Knowledge Questionnaire Score:  Knowledge Questionnaire Score - 04/19/22 1400       Knowledge Questionnaire Score   Pre Score 22/26: Exercise, MI, Angina             Core Components/Risk Factors/Patient Goals at Admission:  Personal Goals and Risk Factors at Admission - 04/19/22 1630       Core Components/Risk Factors/Patient Goals on Admission    Weight Management Yes;Weight Loss;Obesity    Intervention Weight Management: Develop a combined nutrition and exercise program designed to reach desired caloric intake, while maintaining appropriate intake of nutrient and fiber, sodium and fats, and appropriate energy expenditure required for the weight goal.;Weight Management: Provide education and appropriate resources to help participant work on and attain dietary goals.;Weight Management/Obesity: Establish reasonable short term and long term weight goals.;Obesity: Provide education and appropriate resources to help participant work on and attain dietary goals.    Admit Weight 321 lb (145.6 kg)    Goal  Weight: Short Term 315 lb (142.9 kg)    Goal Weight: Long Term 260 lb (117.9 kg)    Expected  Outcomes Long Term: Adherence to nutrition and physical activity/exercise program aimed toward attainment of established weight goal;Short Term: Continue to assess and modify interventions until short term weight is achieved;Weight Loss: Understanding of general recommendations for a balanced deficit meal plan, which promotes 1-2 lb weight loss per week and includes a negative energy balance of [phone] kcal/d;Understanding recommendations for meals to include 15-35% energy as protein, 25-35% energy from fat, 35-60% energy from carbohydrates, less than 274m of dietary cholesterol, 20-35 gm of total fiber daily;Understanding of distribution of calorie intake throughout the day with the consumption of 4-5 meals/snacks    Diabetes Yes    Intervention Provide education about signs/symptoms and action to take for hypo/hyperglycemia.;Provide education about proper nutrition, including hydration, and aerobic/resistive exercise prescription along with prescribed medications to achieve blood glucose in normal ranges: Fasting glucose 65-99 mg/dL    Expected Outcomes Short Term: Participant verbalizes understanding of the signs/symptoms and immediate care of hyper/hypoglycemia, proper foot care and importance of medication, aerobic/resistive exercise and nutrition plan for blood glucose control.;Long Term: Attainment of HbA1C < 7%.    Heart Failure Yes    Intervention Provide a combined exercise and nutrition program that is supplemented with education, support and counseling about heart failure. Directed toward relieving symptoms such as shortness of breath, decreased exercise tolerance, and extremity edema.    Expected Outcomes Improve functional capacity of life;Short term: Attendance in program 2-3 days a week with increased exercise capacity. Reported lower sodium intake. Reported increased fruit and vegetable intake. Reports medication compliance.;Short term: Daily weights obtained and reported for increase.  Utilizing diuretic protocols set by physician.;Long term: Adoption of self-care skills and reduction of barriers for early signs and symptoms recognition and intervention leading to self-care maintenance.    Hypertension Yes    Intervention Provide education on lifestyle modifcations including regular physical activity/exercise, weight management, moderate sodium restriction and increased consumption of fresh fruit, vegetables, and low fat dairy, alcohol moderation, and smoking cessation.;Monitor prescription use compliance.    Expected Outcomes Short Term: Continued assessment and intervention until BP is < 140/943mHG in hypertensive participants. < 130/8055mG in hypertensive participants with diabetes, heart failure or chronic kidney disease.;Long Term: Maintenance of blood pressure at goal levels.    Lipids Yes    Intervention Provide education and support for participant on nutrition & aerobic/resistive exercise along with prescribed medications to achieve LDL <36m62mDL >40mg4m Expected Outcomes Short Term: Participant states understanding of desired cholesterol values and is compliant with medications prescribed. Participant is following exercise prescription and nutrition guidelines.;Long Term: Cholesterol controlled with medications as prescribed, with individualized exercise RX and with personalized nutrition plan. Value goals: LDL < 36mg,89m > 40 mg.             Education:Diabetes - Individual verbal and written instruction to review signs/symptoms of diabetes, desired ranges of glucose level fasting, after meals and with exercise. Acknowledge that pre and post exercise glucose checks will be done for 3 sessions at entry of program. [facility]  Date 04/16/22  [facility]ator [facility] 1- Verbalizes Understanding       Core Components/Risk Factors/Patient Goals Review:   Goals and Risk Factor Review      Row Name 05/14/22 1124 05/25/22 1122  Core Components/Risk Factors/Patient Goals Review   Personal Goals Review Weight Management/Obesity;Hypertension Weight Management/Obesity      Review Jerrett is able to check his blood pressure at home and has been good in the program. He checks his blood pressure at home routinely. He would like to lose more weight and gain more strength. Pantelis has lost some more weight and wants to reach a weight goal of 240 pounds. He needs to be 260 pounds to get a new heart. He is determined to get his weight off. He has lost a few pounds since he last visited.      Expected Outcomes Short: lose 5 pounds in a couple weeks. Long: reach weight goal. Short: lose more weight. Long: reach weight goal.               Core Components/Risk Factors/Patient Goals at Discharge (Final Review):   Goals and Risk Factor Review - 05/25/22 1122       Core Components/Risk Factors/Patient Goals Review   Personal Goals Review Weight Management/Obesity    Review Delonta has lost some more weight and wants to reach a weight goal of 240 pounds. He needs to be 260 pounds to get a new heart. He is determined to get his weight off. He has lost a few pounds since he last visited.    Expected Outcomes Short: lose more weight. Long: reach weight goal.             ITP Comments:  ITP Comments     Row Name 04/16/22 1420 04/19/22 1343 04/23/22 1113 05/16/22 0836 06/13/22 1233   ITP Comments Initial telephone orientation completed. Diagnosis can be found in [facility] 3/30. EP orientation scheduled for Thursday 4/30 at 9:30. Completed 6MWT and gym orientation. Initial ITP created and sent for review to Dr. Emily Filbert, Medical Director. First full day of exercise!  Patient was oriented to gym and equipment including functions, settings, policies, and procedures.  Patient's individual exercise prescription and treatment plan were reviewed.  All starting workloads were established based  on the results of the 6 minute walk test done at initial orientation visit.  The plan for exercise progression was also introduced and progression will be customized based on patient's performance and goals. 30 Day review completed. Medical Director ITP review done, changes made as directed, and signed approval by Medical Director.    NEW 30 Day review completed. Medical Director ITP review done, changes made as directed, and signed approval by Medical Director.    [place] Name 06/18/22 1206 06/27/22 1300 07/11/22 0808       ITP Comments Completed initial RD consultation Patient called to let us know that his doctor gave him a Zio patch to wear for 2 weeks and was told to be out of rehab until it is removed. He is supposed to take it off on 7/11, we'll plan for patient to return to rehab on 7/12  with clearance or unless his doctors says otherwise based on results. 30 Day review completed. Medical Director ITP review done, changes made as directed, and signed approval by Medical Director.              Comments:

## 2022-07-13 ENCOUNTER — Ambulatory Visit (INDEPENDENT_AMBULATORY_CARE_PROVIDER_SITE_OTHER): Payer: BC Managed Care – PPO | Admitting: Vascular Surgery

## 2022-07-16 ENCOUNTER — Encounter: Payer: 59 | Admitting: *Deleted

## 2022-07-16 DIAGNOSIS — I5022 Chronic systolic (congestive) heart failure: Secondary | ICD-10-CM | POA: Diagnosis not present

## 2022-07-16 NOTE — Progress Notes (Signed)
Daily Session Note  Patient Details  Name: Robert Mccann MRN: [id number] Date of Birth: 1996-07-26 Referring Provider:   Flowsheet Row Cardiac Rehab from 04/19/2022 in [facility] Cardiac and Pulmonary Rehab  Referring Provider Glori Bickers MD       Encounter Date: 07/16/2022  Check In:  Session Check In - 07/16/22 1127       Check-In   Supervising physician immediately available to respond to emergencies See telemetry face sheet for immediately available ER MD    Location [facility]-Cardiac & Pulmonary Rehab    Staff Present Heath Lark, RN, BSN, CCRP;Kristen Coble, RN,BC,MSN;Kelly Hardy, BS, ACSM CEP, Exercise Physiologist    Virtual Visit No    Medication changes reported     No    Fall or balance concerns reported    No    Warm-up and Cool-down Performed on first and last piece of equipment    Resistance Training Performed Yes    VAD Patient? No    PAD/SET Patient? No      Pain Assessment   Currently in Pain? No/denies                Social History   Tobacco Use  Smoking Status Never  Smokeless Tobacco Never    Goals Met:  Independence with exercise equipment Exercise tolerated well No report of concerns or symptoms today  Goals Unmet:  Not Applicable  Comments: Pt able to follow exercise prescription today without complaint.  Will continue to monitor for progression.    Dr. Emily Filbert is Medical Director for [place].  Dr. Ottie Glazier is Medical Director for [facility] Pulmonary Rehabilitation.

## 2022-07-18 DIAGNOSIS — I5022 Chronic systolic (congestive) heart failure: Secondary | ICD-10-CM | POA: Diagnosis not present

## 2022-07-18 NOTE — Progress Notes (Signed)
Daily Session Note  Patient Details  Name: Graylen Noboa MRN: [id number] Date of Birth: 1996-01-11 Referring Provider:   Flowsheet Row Cardiac Rehab from 04/19/2022 in [facility] Cardiac and Pulmonary Rehab  Referring Provider Glori Bickers MD       Encounter Date: 07/18/2022  Check In:  Session Check In - 07/18/22 1112       Check-In   Supervising physician immediately available to respond to emergencies See telemetry face sheet for immediately available ER MD    Location [facility]-Cardiac & Pulmonary Rehab    Staff Present Antionette Fairy, BS, Exercise Physiologist;Kristen Coble, RN,BC,MSN;Susanne Bice, RN, BSN, CCRP;Melissa Caiola, RDN, LDN    Virtual Visit No    Medication changes reported     No    Fall or balance concerns reported    No    Tobacco Cessation No Change    Warm-up and Cool-down Performed on first and last piece of equipment    Resistance Training Performed Yes    VAD Patient? No    PAD/SET Patient? No      Pain Assessment   Currently in Pain? No/denies    Multiple Pain Sites No                Social History   Tobacco Use  Smoking Status Never  Smokeless Tobacco Never    Goals Met:  Independence with exercise equipment Exercise tolerated well No report of concerns or symptoms today  Goals Unmet:  Not Applicable  Comments: Pt able to follow exercise prescription today without complaint.  Will continue to monitor for progression.    Dr. Emily Filbert is Medical Director for [place].  Dr. Ottie Glazier is Medical Director for [facility] Pulmonary Rehabilitation.

## 2022-07-19 ENCOUNTER — Telehealth (HOSPITAL_COMMUNITY): Payer: Self-pay | Admitting: *Deleted

## 2022-07-19 NOTE — Telephone Encounter (Signed)
Cardiac CT approved auath # [id number] exp. 10/09/22

## 2022-07-19 NOTE — Progress Notes (Signed)
Hi- your blood work does not find any obvious reason for elevated Hemoglobin.  Could potentially be elevated from your diuretics; and also possible sleep apnea.    You will need to be on blood thinners indefinitely given your history of blood clots  We will be happy to evaluate you in the clinic if you have any further questions or concerns.  Thanks GB

## 2022-07-19 NOTE — Addendum Note (Signed)
Encounter addended by: Noralee Space, RN on: 07/19/2022 3:19 PM  Actions taken: Visit diagnoses modified, Order list changed, Diagnosis association updated

## 2022-07-20 ENCOUNTER — Encounter: Payer: 59 | Admitting: *Deleted

## 2022-07-20 VITALS — Wt 328.5 lb

## 2022-07-20 DIAGNOSIS — I5022 Chronic systolic (congestive) heart failure: Secondary | ICD-10-CM

## 2022-07-20 NOTE — Progress Notes (Signed)
Daily Session Note  Patient Details  Name: Robert Mccann MRN: [id number] Date of Birth: May 14, 1996 Referring Provider:   Flowsheet Row Cardiac Rehab from 04/19/2022 in [facility] Cardiac and Pulmonary Rehab  Referring Provider Glori Bickers MD       Encounter Date: 07/20/2022  Check In:  Session Check In - 07/20/22 1120       Check-In   Supervising physician immediately available to respond to emergencies See telemetry face sheet for immediately available ER MD    Location [facility]-Cardiac & Pulmonary Rehab    Staff Present Hope Budds, RDN, LDN;Jessica Luan Pulling, MA, RCEP, CCRP, CCET;Bertha Lokken Sherryll Burger, RN BSN    Virtual Visit No    Medication changes reported     No    Fall or balance concerns reported    No    Warm-up and Cool-down Performed on first and last piece of equipment    Resistance Training Performed Yes    VAD Patient? No    PAD/SET Patient? No      Pain Assessment   Currently in Pain? No/denies                Social History   Tobacco Use  Smoking Status Never  Smokeless Tobacco Never    Goals Met:  Independence with exercise equipment Exercise tolerated well No report of concerns or symptoms today Strength training completed today  Goals Unmet:  Not Applicable  Comments: Pt able to follow exercise prescription today without complaint.  Will continue to monitor for progression.  [place] Name 04/19/22 1617 07/20/22 1131       6 Minute Walk   Phase Initial Discharge    Distance 1020 feet 1530 feet    Walk Time 6 minutes 6 minutes    # of Rest Breaks 0 0    MPH 1.93 2.89    METS 3.88 4.61    RPE 9 13    Perceived Dyspnea  0 0    VO2 Peak 13.6 16.13    Symptoms Yes (comment) Yes (comment)    Comments Left calf pain 3/10 leg fatigue    Resting HR 92 bpm 81 bpm    Resting BP 104/70 108/62    Resting Oxygen Saturation  98 % 98 %    Exercise Oxygen Saturation  during 6 min walk 98 % 94 %    Max Ex. HR 119 bpm 114 bpm    Max  Ex. BP 132/74 126/74    2 Minute Post BP 108/78 --               Dr. Emily Filbert is Medical Director for [place].  Dr. Ottie Glazier is Medical Director for [facility] Pulmonary Rehabilitation.

## 2022-07-23 ENCOUNTER — Encounter: Payer: 59 | Admitting: *Deleted

## 2022-07-23 DIAGNOSIS — I5022 Chronic systolic (congestive) heart failure: Secondary | ICD-10-CM | POA: Diagnosis not present

## 2022-07-23 NOTE — Progress Notes (Signed)
Daily Session Note  Patient Details  Name: Robert Mccann MRN: [id number] Date of Birth: March 07, 1996 Referring Provider:   Flowsheet Row Cardiac Rehab from 04/19/2022 in [facility] Cardiac and Pulmonary Rehab  Referring Provider Glori Bickers MD       Encounter Date: 07/23/2022  Check In:  Session Check In - 07/23/22 1116       Check-In   Supervising physician immediately available to respond to emergencies See telemetry face sheet for immediately available ER MD    Location [facility]-Cardiac & Pulmonary Rehab    Staff Present Hope Budds, RDN, LDN;Pheonix Clinkscale Sherryll Burger, RN Moises Blood, BS, ACSM CEP, Exercise Physiologist    Virtual Visit No    Medication changes reported     No    Fall or balance concerns reported    No    Warm-up and Cool-down Performed on first and last piece of equipment    Resistance Training Performed Yes    VAD Patient? No    PAD/SET Patient? No      Pain Assessment   Currently in Pain? No/denies                Social History   Tobacco Use  Smoking Status Never  Smokeless Tobacco Never    Goals Met:  Independence with exercise equipment Exercise tolerated well No report of concerns or symptoms today Strength training completed today  Goals Unmet:  Not Applicable  Comments: Pt able to follow exercise prescription today without complaint.  Will continue to monitor for progression.    Dr. Emily Filbert is Medical Director for [place].  Dr. Ottie Glazier is Medical Director for [facility] Pulmonary Rehabilitation.

## 2022-07-26 NOTE — Patient Instructions (Signed)
Discharge Patient Instructions  Patient Details  Name: Robert Mccann MRN: [id number] Date of Birth: 04/12/1996 Referring Provider:  Ellene Route   Number of Visits: 36  Reason for Discharge:  Patient reached a stable level of exercise. Patient independent in their exercise. Patient has met program and personal goals.  Smoking History:  Social History   Tobacco Use  Smoking Status Never  Smokeless Tobacco Never    Diagnosis:  Heart failure, chronic systolic (HCC)  Initial Exercise Prescription:  Initial Exercise Prescription - 04/19/22 1600       Date of Initial Exercise RX and Referring Provider   Date 04/19/22    Referring Provider Glori Bickers MD      Oxygen   Maintain Oxygen Saturation 88% or higher      NuStep   Level 1    SPM 80    Minutes 15    METs 3.8      REL-XR   Level 1    Speed 50    Minutes 15    METs 3.8      T5 Nustep   Level 1    SPM 80    Minutes 15    METs 3.8      Track   Laps 21   As tolerated - take breaks as needed   Minutes 15    METs 2.14      Prescription Details   Frequency (times per week) 3    Duration Progress to 30 minutes of continuous aerobic without signs/symptoms of physical distress      Intensity   THRR 40-80% of Max Heartrate 132 - 162    Ratings of Perceived Exertion 11-13    Perceived Dyspnea 0-4      Progression   Progression Continue to progress workloads to maintain intensity without signs/symptoms of physical distress.      Resistance Training   Training Prescription Yes    Weight 3 lb    Reps 10-15             Discharge Exercise Prescription (Final Exercise Prescription Changes):  Exercise Prescription Changes - 07/16/22 0800       Response to Exercise   Blood Pressure (Admit) 126/66    Blood Pressure (Exit) 120/64    Heart Rate (Admit) 108 bpm    Heart Rate (Exercise) 116 bpm    Heart Rate (Exit) 100 bpm    Rating of Perceived Exertion (Exercise) 12    Symptoms  none    Duration Continue with 30 min of aerobic exercise without signs/symptoms of physical distress.    Intensity THRR unchanged      Progression   Progression Continue to progress workloads to maintain intensity without signs/symptoms of physical distress.    Average METs 3.93      Resistance Training   Training Prescription Yes    Weight 7 lb    Reps 10-15      Interval Training   Interval Training No      Treadmill   MPH 1.7    Grade 1.5    Minutes 15    METs 2.65      NuStep   Level 5    Minutes 15    METs 5.2      Home Exercise Plan   Plans to continue exercise at Home (comment)   walking and bike   Frequency Add 2 additional days to program exercise sessions.    Initial Home Exercises Provided 05/14/22  Oxygen   Maintain Oxygen Saturation 88% or higher             Functional Capacity:  6 Minute Walk     Row Name 04/19/22 1617 07/20/22 1131       6 Minute Walk   Phase Initial Discharge    Distance 1020 feet 1530 feet    Walk Time 6 minutes 6 minutes    # of Rest Breaks 0 0    MPH 1.93 2.89    METS 3.88 4.61    RPE 9 13    Perceived Dyspnea  0 0    VO2 Peak 13.6 16.13    Symptoms Yes (comment) Yes (comment)    Comments Left calf pain 3/10 leg fatigue    Resting HR 92 bpm 81 bpm    Resting BP 104/70 108/62    Resting Oxygen Saturation  98 % 98 %    Exercise Oxygen Saturation  during 6 min walk 98 % 94 %    Max Ex. HR 119 bpm 114 bpm    Max Ex. BP 132/74 126/74    2 Minute Post BP 108/78 --              Nutrition & Weight - Outcomes:  Pre Biometrics - 04/19/22 1616       Pre Biometrics   Height _0  (1.803 m)    Weight 321 lb 3.2 oz (145.7 kg)    BMI (Calculated) 44.82             Post Biometrics - 07/20/22 1135        Post  Biometrics   Weight 328 lb 8 oz (149 kg)    Single Leg Stand 13.6 seconds             Nutrition:  Nutrition Therapy & Goals - 06/18/22 1148       Nutrition Therapy   Diet Heart  healthy, low Na, T2DM MNT    Drug/Food Interactions Statins/Certain Fruits    Protein (specify units) 80g   used adjusted body weight   Fiber 30 grams    Whole Grain Foods 3 servings    Saturated Fats 16 max. grams    Fruits and Vegetables 8 servings/day    Sodium 2 grams      Personal Nutrition Goals   Nutrition Goal ST: limit sugar sweetened beverages, eat consistently during the day, practice MyPlate guidleines, refrigerate rice from chinese food place to have a better BG response  LT: meet nutritional needs while persuing weight loss, follow MyPlate guidelines, increase variety of foods, limit added sugar <36g/day    Comments 26 y.o. M admitted to cardiac rehab chronic systolic HF. PMHx includes HTN, NSTEMI, HFrEF, T2DM, HLD. Relevant medications includes biotin, metformin, farxiga, K+, crestor, ozempic. Latest A1C = 8.9%. PYP Score: 56. Vegetables & Fruits 9/12. Breads, Grains & Cereals 10/12. Red & Processed Meat 8/12. Poultry 2/2. Fish & Shellfish 2/4. Beans, Nuts & Seeds 4/4. Milk & Dairy Foods 2/6. Toppings, Oils, Seasonings & Salt 5/20. Sweets, Snacks & Restaurant Food 8/14. Beverages 6/10.  Robert Mccann typically eats 2 meals per day: splits chinese food of shrimp and broccoli with white rice. Sometimes cereal - captain crunch, honey bunches of oats with whole milk. He drinks juice and water during the day - he has also bought trulemon to flavor his water. The ozempic has reduced his appetite as well as the weather. He reports that he has belching with foul odor - discussed how this  could be due to reflux or certain medications, recommended to discuss this with his doctor. He also reports 20lb weight fluctuations in short amount of time - discussed how this was likely fluid and to tell his doctor- also advised to limit restaurant food as this can be very high in salt whcih can worsen fluid retention. Discussed T2DM MNT as well as heart healhty eating. Robert Mccann would like to lose weight, but is having  trouble doing so - discussed body's compensatory mechanisms and encouraged to focus on eating consistently, limit excess calories from sugar-sweetened beverages, following MyPlate guidelines for proportions of foods as well as encouraging more nutrient dense foods. Provided some calorie estimations, but emphasized as a starting point as calorie needs and weight management are more complicated - advised to not eat below his BMR.      Intervention Plan   Intervention Prescribe, educate and counsel regarding individualized specific dietary modifications aiming towards targeted core components such as weight, hypertension, lipid management, diabetes, heart failure and other comorbidities.    Expected Outcomes Short Term Goal: Understand basic principles of dietary content, such as calories, fat, sodium, cholesterol and nutrients.;Short Term Goal: A plan has been developed with personal nutrition goals set during dietitian appointment.;Long Term Goal: Adherence to prescribed nutrition plan.               Goals reviewed with patient; copy given to patient.

## 2022-07-27 ENCOUNTER — Encounter: Payer: 59 | Admitting: *Deleted

## 2022-07-27 DIAGNOSIS — I5022 Chronic systolic (congestive) heart failure: Secondary | ICD-10-CM

## 2022-07-27 NOTE — Progress Notes (Signed)
Daily Session Note  Patient Details  Name: Less Woolsey MRN: [id number] Date of Birth: [birth year] Referring Provider:   Flowsheet Row Cardiac Rehab from 04/19/2022 in [facility] Cardiac and Pulmonary Rehab  Referring Provider Glori Bickers MD       Encounter Date: 07/27/2022  Check In:  Session Check In - 07/27/22 1121       Check-In   Supervising physician immediately available to respond to emergencies See telemetry face sheet for immediately available ER MD    Location [facility]-Cardiac & Pulmonary Rehab    Staff Present Alberteen Sam, MA, RCEP, CCRP, CCET;Laureen Owens Shark, BS, RRT, CPFT;Danitza Schoenfeldt Sherryll Burger, RN BSN    Virtual Visit No    Medication changes reported     No    Fall or balance concerns reported    No    Warm-up and Cool-down Performed on first and last piece of equipment    Resistance Training Performed Yes    VAD Patient? No    PAD/SET Patient? No      Pain Assessment   Currently in Pain? No/denies                Social History   Tobacco Use  Smoking Status Never  Smokeless Tobacco Never    Goals Met:  Independence with exercise equipment Exercise tolerated well No report of concerns or symptoms today Strength training completed today  Goals Unmet:  Not Applicable  Comments: Pt able to follow exercise prescription today without complaint.  Will continue to monitor for progression.    Dr. Emily Filbert is Medical Director for [place].  Dr. Ottie Glazier is Medical Director for [facility] Pulmonary Rehabilitation.

## 2022-07-31 ENCOUNTER — Telehealth: Payer: Self-pay | Admitting: Pharmacist

## 2022-07-31 ENCOUNTER — Ambulatory Visit (INDEPENDENT_AMBULATORY_CARE_PROVIDER_SITE_OTHER): Payer: 59 | Admitting: Vascular Surgery

## 2022-07-31 ENCOUNTER — Encounter (INDEPENDENT_AMBULATORY_CARE_PROVIDER_SITE_OTHER): Payer: Self-pay | Admitting: Vascular Surgery

## 2022-07-31 VITALS — BP 146/75 | HR 76 | Resp 16 | Wt 327.6 lb

## 2022-07-31 DIAGNOSIS — I10 Essential (primary) hypertension: Secondary | ICD-10-CM | POA: Diagnosis not present

## 2022-07-31 DIAGNOSIS — E1121 Type 2 diabetes mellitus with diabetic nephropathy: Secondary | ICD-10-CM

## 2022-07-31 DIAGNOSIS — I2609 Other pulmonary embolism with acute cor pulmonale: Secondary | ICD-10-CM | POA: Diagnosis not present

## 2022-07-31 DIAGNOSIS — I429 Cardiomyopathy, unspecified: Secondary | ICD-10-CM

## 2022-07-31 DIAGNOSIS — I2699 Other pulmonary embolism without acute cor pulmonale: Secondary | ICD-10-CM

## 2022-07-31 NOTE — Progress Notes (Signed)
MRN : [id number]  Robert Mccann is a 26 y.o. (Jan 15, 1996) male who presents with chief complaint of  Chief Complaint  Patient presents with   Follow-up    9 month follow up  .  History of Present Illness: Patient returns today in follow up of his pulmonary embolus.  Almost a year ago, he underwent pulmonary thrombectomy for submassive pulmonary embolus with right heart strain.  He initially did well until about 5 or 6 months ago and he was taken off of anticoagulation for a few days and apparently had another pulmonary embolus as well as potential stroke.  He has a markedly reduced ejection fraction with cardiomyopathy.  He was restarted on anticoagulation.  His lower extremity work-up for DVT was apparently unrevealing.  He is fine now and says he is at his baseline.  Not having any chest pain or shortness of breath.  Tolerating Eliquis 5 mg twice daily at this point.  Current Outpatient Medications  Medication Sig Dispense Refill   apixaban (ELIQUIS) 5 MG TABS tablet Take 1 tablet (5 mg total) by mouth 2 (two) times daily. 60 tablet 6   Bempedoic Acid-Ezetimibe (NEXLIZET) 180-10 MG TABS Take 1 tablet by mouth daily. 90 tablet 3   digoxin (LANOXIN) 0.125 MG tablet Take 1 tablet (0.125 mg total) by mouth daily. 30 tablet 6   FARXIGA 10 MG TABS tablet Take 10 mg by mouth daily.     furosemide (LASIX) 20 MG tablet Take 4 tablets (80 mg total) by mouth daily. 120 tablet 6   metFORMIN (GLUCOPHAGE) 500 MG tablet Take 1 tablet (500 mg total) by mouth 2 (two) times daily with a meal. 60 tablet 0   potassium chloride (KLOR-CON) 20 MEQ packet Take 60 mEq by mouth daily. 90 packet 6   rosuvastatin (CRESTOR) 40 MG tablet Take 1 tablet (40 mg total) by mouth daily. 90 tablet 3   sacubitril-valsartan (ENTRESTO) 97-103 MG Take 1 tablet by mouth 2 (two) times daily. 60 tablet 6   Semaglutide, 2 MG/DOSE, (OZEMPIC, 2 MG/DOSE,) 8 MG/3ML SOPN Inject 2 mg into the skin once a week. 3 mL 11    spironolactone (ALDACTONE) 25 MG tablet Take 1 tablet (25 mg total) by mouth daily. 30 tablet 6   Blood Glucose Monitoring Suppl (BLOOD GLUCOSE MONITOR SYSTEM) w/Device KIT Use up to four times daily as directed. (Patient not taking: Reported on 06/26/2022) 1 kit 3   No current facility-administered medications for this visit.    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 05/2021 noted during periods of sleep @ time of hospitalization for PE.   Cardiomyopathy ([place])    a. 05/2021 Echo: EF <20%, glob HK. [place]:C ratio of 2.1:1 suggesting non-compaction. Gr2 DD. Sev reduced RV fxn. Nl PASP. Mildly dil RA. Mild MR; b. 10/2021 Echo: EF <20%, GrII DD; c. 10/2021 TEE: EF 20-25%, glob HK, sev red RV fxn.   Diabetes mellitus without complication ([place])    HFrEF (heart failure with reduced ejection fraction) ([place])    a. 05/2021 Echo: EF <20%. ? non-compaction.   Hypertension    Pulmonary embolism ([place])    a. 05/2021 CTA Chest: PE extending from lobar arteris of RUL and RLL w/ concern for pulml infarct-->s/p thrombolysis and thrombectomy of RUL/RML/RLL.   Stroke [facility])    a. 10/2021 CT Head: Bilat cerebral infarcts w/ mod-sided acute to subacute R temporoparietal infarct; b. 10/2021 MRI: R post PCA territory infarcts  w/o hemorrhage; c. 10/2021 TEE: Neg bubble study. No LA/LAA thrombus. EF 20-25%, glob HK.    Past Surgical History:  Procedure Laterality Date   COLONOSCOPY N/A 05/18/2022   Procedure: COLONOSCOPY;  Surgeon: Sharyn Creamer, MD;  Location: [facility];  Service: Gastroenterology;  Laterality: N/A;   HAND SURGERY Left age 26   HAND SURGERY     HEMOSTASIS CLIP PLACEMENT  05/18/2022   Procedure: HEMOSTASIS CLIP PLACEMENT;  Surgeon: Sharyn Creamer, MD;  Location: [facility];  Service: Gastroenterology;;   POLYPECTOMY  05/18/2022   Procedure: POLYPECTOMY;  Surgeon: Sharyn Creamer, MD;  Location: [facility];  Service: Gastroenterology;;   PULMONARY THROMBECTOMY N/A 06/28/2021    Procedure: PULMONARY THROMBECTOMY;  Surgeon: Algernon Huxley, MD;  Location: [facility];  Service: Cardiovascular;  Laterality: N/A;   TEE WITHOUT CARDIOVERSION N/A 11/08/2021   Procedure: TRANSESOPHAGEAL ECHOCARDIOGRAM (TEE);  Surgeon: Kate Sable, MD;  Location: [facility];  Service: Cardiovascular;  Laterality: N/A;     Social History   Tobacco Use   Smoking status: Never   Smokeless tobacco: Never  Vaping Use   Vaping Use: Never used  Substance Use Topics   Alcohol use: Not Currently    Comment: special occasions   Drug use: Yes    Types: Marijuana    Comment: last month      Family History  Problem Relation Age of Onset   Heart failure Mother    Cerebrovascular Accident Mother    Diabetes Father    Hypertension Father    Cancer Cousin        unknown origin   Cancer Cousin        unknown origing     No Known Allergies  REVIEW OF SYSTEMS (Negative unless checked)   Constitutional: [] Weight loss  [] Fever  [] Chills Cardiac: [] Chest pain   [] Chest pressure   [x] Palpitations   [] Shortness of breath when laying flat   [] Shortness of breath at rest   [] Shortness of breath with exertion. Vascular:  [] Pain in legs with walking   [] Pain in legs at rest   [] Pain in legs when laying flat   [] Claudication   [] Pain in feet when walking  [] Pain in feet at rest  [] Pain in feet when laying flat   [x] History of DVT   [x] Phlebitis   [] Swelling in legs   [] Varicose veins   [] Non-healing ulcers Pulmonary:   [] Uses home oxygen   [] Productive cough   [] Hemoptysis   [] Wheeze  [] COPD   [] Asthma Neurologic:  [] Dizziness  [] Blackouts   [] Seizures   [] History of stroke   [] History of TIA  [] Aphasia   [] Temporary blindness   [] Dysphagia   [] Weakness or numbness in arms   [] Weakness or numbness in legs Musculoskeletal:  [] Arthritis   [] Joint swelling   [] Joint pain   [] Low back pain Hematologic:  [] Easy bruising  [] Easy bleeding   [] Hypercoagulable state   [] Anemic    Gastrointestinal:  [] Blood in stool   [] Vomiting blood  [] Gastroesophageal reflux/heartburn   [] Abdominal pain Genitourinary:  [] Chronic kidney disease   [] Difficult urination  [] Frequent urination  [] Burning with urination   [] Hematuria Skin:  [] Rashes   [] Ulcers   [] Wounds Psychological:  [] History of anxiety   []  History of major depression.  Physical Examination  BP (!) 146/75 (BP Location: Left Arm)   Pulse 76   Resp 16   Wt (!) 327 lb 9.6 oz (148.6 kg)   BMI 45.69 kg/m  Gen:  WD/WN, NAD. Obese  Head: [place]/AT, No temporalis wasting. Ear/Nose/Throat: Hearing grossly intact, nares w/o erythema or drainage Eyes: Conjunctiva clear. Sclera non-icteric Neck: Supple.  Trachea midline Pulmonary:  Good air movement, no use of accessory muscles.  Cardiac: Somewhat irregular Vascular:  Vessel Right Left  Radial Palpable Palpable           Musculoskeletal: M/S 5/5 throughout.  No deformity or atrophy.  No significant lower extremity edema. Neurologic: Sensation grossly intact in extremities.  Symmetrical.  Speech is fluent.  Psychiatric: Judgment intact, Mood & affect appropriate for pt's clinical situation. Dermatologic: No rashes or ulcers noted.  No cellulitis or open wounds.      Labs Recent Results (from the past 2160 hour(s))  Comprehensive metabolic panel     Status: Abnormal   Collection Time: 05/17/22 12:39 PM  Result Value Ref Range   Sodium 136 135 - 145 mmol/L   Potassium 4.1 3.5 - 5.1 mmol/L   Chloride 100 98 - 111 mmol/L   CO2 25 22 - 32 mmol/L   Glucose, Bld 127 (H) 70 - 99 mg/dL    Comment: Glucose reference range applies only to samples taken after fasting for at least 8 hours.   BUN 13 6 - 20 mg/dL   Creatinine, Ser 1.20 0.61 - 1.24 mg/dL   Calcium 9.6 8.9 - 10.3 mg/dL   Total Protein 7.5 6.5 - 8.1 g/dL   Albumin 3.7 3.5 - 5.0 g/dL   AST 31 15 - 41 U/L   ALT 23 0 - 44 U/L   Alkaline Phosphatase 72 38 - 126 U/L   Total Bilirubin 0.9 0.3 - 1.2 mg/dL    GFR, Estimated >60 >60 mL/min    Comment: (NOTE) Calculated using the CKD-EPI Creatinine Equation (2021)    Anion gap 11 5 - 15    Comment: Performed at [facility] [address], [place], [place] [address]  CBC     Status: Abnormal   Collection Time: 05/17/22 12:39 PM  Result Value Ref Range   WBC 6.3 4.0 - 10.5 K/uL   RBC 6.29 (H) 4.22 - 5.81 MIL/uL   Hemoglobin 17.8 (H) 13.0 - 17.0 g/dL   HCT 53.5 (H) 39.0 - 52.0 %   MCV 85.1 80.0 - 100.0 fL   MCH 28.3 26.0 - 34.0 pg   MCHC 33.3 30.0 - 36.0 g/dL   RDW 14.8 11.5 - 15.5 %   Platelets 305 150 - 400 K/uL   nRBC 0.0 0.0 - 0.2 %    Comment: Performed at [facility], [place] [address], [place], [place] [address]  Type and screen [place]     Status: None   Collection Time: 05/17/22 12:39 PM  Result Value Ref Range   ABO/RH(D) O POS    Antibody Screen NEG    Sample Expiration      05/20/2022,2359 Performed at [facility], [place] [address], [place], [place] [address]   Troponin I (High Sensitivity)     Status: None   Collection Time: 05/17/22 12:39 PM  Result Value Ref Range   Troponin I (High Sensitivity) 12 <18 ng/L    Comment: (NOTE) Elevated high sensitivity troponin I (hsTnI) values and significant  changes across serial measurements may suggest ACS but many other  chronic and acute conditions are known to elevate hsTnI results.  Refer to the "Links" section for chest pain algorithms and additional  guidance. Performed at [facility],  [address] [place], [place] [address]   POC CBG, ED     Status: Abnormal   Collection Time: 05/17/22  1:11 PM  Result Value Ref Range   Glucose-Capillary 130 (H) 70 - 99 mg/dL    Comment: Glucose reference range applies only to samples taken after fasting for at least 8 hours.  ABO/Rh     Status: None   Collection Time: 05/17/22  1:23 PM  Result Value Ref Range   ABO/RH(D)      O POS Performed at [facility] [address] [place], [place] [address]   Protime-INR     Status: None   Collection Time: 05/17/22  1:23 PM  Result Value Ref Range   Prothrombin Time 15.0 11.4 - 15.2 seconds   INR 1.2 0.8 - 1.2    Comment: (NOTE) INR goal varies based on device and disease states. Performed at [facility], [place] [address] [place], [place] [address]   POC occult blood, ED     Status: Abnormal   Collection Time: 05/17/22  1:33 PM  Result Value Ref Range   Fecal Occult Bld POSITIVE (A) NEGATIVE  Troponin I (High Sensitivity)     Status: None   Collection Time: 05/17/22  3:16 PM  Result Value Ref Range   Troponin I (High Sensitivity) 14 <18 ng/L    Comment: (NOTE) Elevated high sensitivity troponin I (hsTnI) values and significant  changes across serial measurements may suggest ACS but many other  chronic and acute conditions are known to elevate hsTnI results.  Refer to the "Links" section for chest pain algorithms and additional  guidance. Performed at [facility], [place] [address] [place], [place] [address]   Heparin level (unfractionated)     Status: Abnormal   Collection Time: 05/17/22  4:01 PM  Result Value Ref Range   Heparin Unfractionated >1.10 (H) 0.30 - 0.70 IU/mL    Comment: (NOTE) The clinical reportable range upper limit is being lowered to >1.10 to align with the FDA approved guidance for the current laboratory assay.  If heparin results are below expected values, and patient dosage has  been confirmed, suggest follow up testing of antithrombin III levels. Performed at [facility], [place] [address] [place], [place] [address]   APTT     Status: Abnormal   Collection Time: 05/17/22  4:01 PM  Result Value Ref Range   aPTT 39 (H) 24 - 36 seconds    Comment:        IF BASELINE aPTT IS ELEVATED, SUGGEST PATIENT RISK ASSESSMENT BE USED TO DETERMINE APPROPRIATE ANTICOAGULANT THERAPY. Performed at [facility], [place] [address] [place],  [address]   CBG  monitoring, ED     Status: Abnormal   Collection Time: 05/17/22  4:33 PM  Result Value Ref Range   Glucose-Capillary 134 (H) 70 - 99 mg/dL    Comment: Glucose reference range applies only to samples taken after fasting for at least 8 hours.  Glucose, capillary     Status: Abnormal   Collection Time: 05/17/22  8:54 PM  Result Value Ref Range   Glucose-Capillary 178 (H) 70 - 99 mg/dL    Comment: Glucose reference range applies only to samples taken after fasting for at least 8 hours.  CBC     Status: Abnormal   Collection Time: 05/18/22 12:37 AM  Result Value Ref Range   WBC 6.1 4.0 - 10.5 K/uL  RBC 6.82 (H) 4.22 - 5.81 MIL/uL   Hemoglobin 19.6 (H) 13.0 - 17.0 g/dL   HCT 57.9 (H) 39.0 - 52.0 %    Comment: REPEATED TO VERIFY   MCV 84.9 80.0 - 100.0 fL   MCH 28.7 26.0 - 34.0 pg   MCHC 33.9 30.0 - 36.0 g/dL   RDW 15.8 (H) 11.5 - 15.5 %   Platelets 258 150 - 400 K/uL   nRBC 0.0 0.0 - 0.2 %    Comment: Performed at [facility] [address] [place], [place] [address]  Heparin level (unfractionated)     Status: Abnormal   Collection Time: 05/18/22  1:29 AM  Result Value Ref Range   Heparin Unfractionated >1.10 (H) 0.30 - 0.70 IU/mL    Comment: (NOTE) The clinical reportable range upper limit is being lowered to >1.10 to align with the FDA approved guidance for the current laboratory assay.  If heparin results are below expected values, and patient dosage has  been confirmed, suggest follow up testing of antithrombin III levels. Performed at [facility], [place] [address] [place], [place] [address]   APTT     Status: Abnormal   Collection Time: 05/18/22  1:29 AM  Result Value Ref Range   aPTT 110 (H) 24 - 36 seconds    Comment:        IF BASELINE aPTT IS ELEVATED, SUGGEST PATIENT RISK ASSESSMENT BE USED TO DETERMINE APPROPRIATE ANTICOAGULANT THERAPY. Performed at [facility], [place] [address], [place], [place] [address]   Heparin level (unfractionated)      Status: Abnormal   Collection Time: 05/18/22  4:10 AM  Result Value Ref Range   Heparin Unfractionated 0.87 (H) 0.30 - 0.70 IU/mL    Comment: (NOTE) The clinical reportable range upper limit is being lowered to >1.10 to align with the FDA approved guidance for the current laboratory assay.  If heparin results are below expected values, and patient dosage has  been confirmed, suggest follow up testing of antithrombin III levels. Performed at [facility], [place] [address] [place], [place] [address]   Glucose, capillary     Status: Abnormal   Collection Time: 05/18/22  7:31 AM  Result Value Ref Range   Glucose-Capillary 147 (H) 70 - 99 mg/dL    Comment: Glucose reference range applies only to samples taken after fasting for at least 8 hours.  Surgical pathology     Status: None   Collection Time: 05/18/22  9:25 AM  Result Value Ref Range   SURGICAL PATHOLOGY      SURGICAL PATHOLOGY CASE: [id number] PATIENT: [facility] Surgical Pathology Report     Clinical History: rectal bleeding (cm)     FINAL MICROSCOPIC DIAGNOSIS:  A. COLON, TRANSVERSE, POLYPECTOMY: Polypoid fragment of colonic mucosa with an underlying hyperplastic lymphoid nodule. Negative for adenomatous change, dysplasia and malignancy.   GROSS DESCRIPTION:  A. Received in formalin labeled with the patients name and "Transverse colon polyp" is a 2.0 x 2.0 x 0.2 cm aggregate of tan soft tissue fragments, submitted in toto in a single cassette.  (LEF 05/21/2022)   Final Diagnosis performed by Unknown Jim, MD.   Electronically signed 05/21/2022 Technical and / or Professional components performed at [facility]. [facility], [place] [address] [place], [place] [address].  Immunohistochemistry Technical component (if applicable) was performed at [facility]. [address], [place], [place],  [address].   IMMUNOHISTOCH EMISTRY DISCLAIMER (if applicable):  Some of  these immunohistochemical stains may have been developed and the performance characteristics determine by [facility]. Some may not have been cleared or approved by the U.S. Food and Drug Administration. The FDA has determined that such clearance or approval is not necessary. This test is used for clinical purposes. It should not be regarded as investigational or for research. This laboratory is certified under the [place] (CLIA-88) as qualified to perform high complexity clinical laboratory testing.  The controls stained appropriately.   Glucose, capillary     Status: Abnormal   Collection Time: 05/18/22 11:29 AM  Result Value Ref Range   Glucose-Capillary 129 (H) 70 - 99 mg/dL    Comment: Glucose reference range applies only to samples taken after fasting for at least 8 hours.  Beta-2-glycoprotein i abs, IgG/M/A     Status: Abnormal   Collection Time: 05/30/22 12:24 PM  Result Value Ref Range   Beta-2 Glyco I IgG 11 0 - 20 GPI IgG units    Comment: (NOTE) The reference interval reflects a 3SD or 99th percentile interval, which is thought to represent a potentially clinically significant result in accordance with the International Consensus Statement on the classification criteria for definitive antiphospholipid syndrome (APS). J Thromb Haem 2006;4:295-306.    Beta-2-Glycoprotein I IgM <9 0 - 32 GPI IgM units    Comment: (NOTE) The reference interval reflects a 3SD or 99th percentile interval, which is thought to represent a potentially clinically significant result in accordance with the International Consensus Statement on the classification criteria for definitive antiphospholipid syndrome (APS). J Thromb Haem 2006;4:295-306. Performed At: [facility] [place], [place] [id number] Rush Farmer MD AY:[id number]    Beta-2-Glycoprotein I IgA 46 (H) 0 - 25 GPI IgA units    Comment: (NOTE) The  reference interval reflects a 3SD or 99th percentile interval, which is thought to represent a potentially clinically significant result in accordance with the International Consensus Statement on the classification criteria for definitive antiphospholipid syndrome (APS). J Thromb Haem 2006;4:295-306.   ANTIPHOSPHOLIPID SYNDROME PROF     Status: Abnormal   Collection Time: 05/30/22 12:24 PM  Result Value Ref Range   Anticardiolipin IgG <9 0 - 14 GPL U/mL    Comment: (NOTE)                          Negative:              <15                          Indeterminate:     15 - 20                          Low-Med Positive: >20 - 80                          High Positive:         >80    Anticardiolipin IgM <9 0 - 12 MPL U/mL    Comment: (NOTE)                          Negative:              <13  Indeterminate:     13 - 20                          Low-Med Positive: >20 - 80                          High Positive:         >80    PTT Lupus Anticoagulant 46.1 (H) 0.0 - 43.5 sec   DRVVT 52.9 (H) 0.0 - 47.0 sec   Lupus Anticoag Interp Comment:     Comment: (NOTE) No lupus anticoagulant was detected. Mixing studies suggest the presence of an inhibitor. It should be noted that mixing studies performed on samples with minimally extended aPTT results can be equivocal.  Normal plasma can overcome weak inhibitors, also resulting in a correction of the mixing study.  The pattern observed could be caused by a specific factor inhibitor, dabigatran (a direct thrombin inhibitor anticoagulant), or a direct Xa inhibitor anticoagulant therapy such as rivaroxaban, apixaban or edoxaban. As antibody titers may fluctuate with time, repeat testing may be indicated and ideally should be performed in the absence of anticoagulant therapy. Performed At: [facility] [place], [place] [id number] Rush Farmer MD [id number]   Erythropoietin     Status: None    Collection Time: 05/30/22 12:24 PM  Result Value Ref Range   Erythropoietin 5.6 2.6 - 18.5 mIU/mL    Comment: (NOTE) [employer] UniCel DxI 800 Immunoassay System Values obtained with different assay methods or kits cannot be used interchangeably. Results cannot be interpreted as absolute evidence of the presence or absence of malignant disease. Performed At: [facility] [place], [place] [id number] Rush Farmer MD IN:[id number]   Testosterone     Status: None   Collection Time: 05/30/22 12:24 PM  Result Value Ref Range   Testosterone 456 264 - 916 ng/dL    Comment: (NOTE) Adult male reference interval is based on a population of healthy nonobese males (BMI <30) between 35 and 26 years old. Center, [place] [phone]. PMID: [id number]. Performed At: [facility] [place], [place] [id number] Rush Farmer MD YF:[id number]   Comprehensive metabolic panel     Status: Abnormal   Collection Time: 05/30/22 12:24 PM  Result Value Ref Range   Sodium 135 135 - 145 mmol/L   Potassium 3.8 3.5 - 5.1 mmol/L   Chloride 101 98 - 111 mmol/L   CO2 26 22 - 32 mmol/L   Glucose, Bld 135 (H) 70 - 99 mg/dL    Comment: Glucose reference range applies only to samples taken after fasting for at least 8 hours.   BUN 11 6 - 20 mg/dL   Creatinine, Ser 0.94 0.61 - 1.24 mg/dL   Calcium 8.7 (L) 8.9 - 10.3 mg/dL   Total Protein 7.9 6.5 - 8.1 g/dL   Albumin 3.7 3.5 - 5.0 g/dL   AST 33 15 - 41 U/L   ALT 24 0 - 44 U/L   Alkaline Phosphatase 83 38 - 126 U/L   Total Bilirubin 0.5 0.3 - 1.2 mg/dL   GFR, Estimated >60 >60 mL/min    Comment: (NOTE) Calculated using the CKD-EPI Creatinine Equation (2021)    Anion gap 8 5 - 15    Comment: Performed at [facility], [address] [place], [place] [address]  CBC with Differential/Platelet     Status: Abnormal  Collection Time: 05/30/22 12:24 PM  Result Value Ref Range   WBC 5.9 4.0 -  10.5 K/uL   RBC 5.89 (H) 4.22 - 5.81 MIL/uL   Hemoglobin 16.6 13.0 - 17.0 g/dL   HCT 49.8 39.0 - 52.0 %   MCV 84.6 80.0 - 100.0 fL   MCH 28.2 26.0 - 34.0 pg   MCHC 33.3 30.0 - 36.0 g/dL   RDW 14.6 11.5 - 15.5 %   Platelets 333 150 - 400 K/uL   nRBC 0.0 0.0 - 0.2 %   Neutrophils Relative % 54 %   Neutro Abs 3.1 1.7 - 7.7 K/uL   Lymphocytes Relative 37 %   Lymphs Abs 2.2 0.7 - 4.0 K/uL   Monocytes Relative 8 %   Monocytes Absolute 0.5 0.1 - 1.0 K/uL   Eosinophils Relative 1 %   Eosinophils Absolute 0.1 0.0 - 0.5 K/uL   Basophils Relative 0 %   Basophils Absolute 0.0 0.0 - 0.1 K/uL   Immature Granulocytes 0 %   Abs Immature Granulocytes 0.01 0.00 - 0.07 K/uL    Comment: Performed at [facility], [place] [place], [place] [address]  JAK2 genotypr     Status: None   Collection Time: 05/30/22 12:24 PM  Result Value Ref Range   JAK2 GenotypR Comment     Comment: (NOTE) Result: NEGATIVE for the JAK2 V617F mutation. Interpretation:  The G to T nucleotide change encoding the V617F mutation was not detected.  This result does not rule out the presence of the JAK2 mutation at a level below the sensitivity of detection of this assay, or the presence of other mutations within JAK2 not detected by this assay.  This result does not rule out a diagnosis of polycythemia vera, essential thrombocythemia or idiopathic myelofibrosis as the V617F mutation is not detected in all patients with these disorders.    Director Review, JAK2 Comment     Comment: (NOTE) Marc Morgans, PhD, [facility] Director, [place] [facility] for [place] and [place],  [phone] This test was developed and its performance characteristics determined by [facility]. It has not been cleared or approved by the Food and Drug Administration. Performed At: [facility] [address] [place], [place] [id number] Katina Degree MDPhD RN:[id number] Performed  At: [facility] [address] [place], [place] [id number] Katina Degree MDPhD OM:[id number]    BACKGROUND: Comment     Comment: (NOTE) JAK2 is a cytoplasmic tyrosine kinase with a key role in signal transduction from multiple hematopoietic growth factor receptors. A point mutation within exon 14 of the JAK2 gene ([id number]) encoding a valine to phenylalanine substitution at position 617 of the JAK2 protein (V617F) has been identified in most patients with polycythemia vera, and in about half of those with either essential thrombocythemia or idiopathic myelofibrosis. The V617F has also been detected, although infrequently, in other myeloid disorders such as chronic myelomonocytic leukemia and chronic neutrophilic luekemia. V617F is an acquired mutation that alters a highly conserved valine present in the negative regulatory JH2 domain of the JAK2 protein and is predicted to dysregulate kinase activity. Methodology: Total genomic DNA was extracted and subjected to TaqMan real-time PCR amplification/detection. Two amplification products per sample were monitored by real-time PCR using primers/probes s pecific to JAK2 wild type (WT) and JAK2 mutant V617F. The ABI7900 Absolute Quantitation software will compare the patient specimen valuse to the standard curves and generate percent values for wild type and mutant  type. In vitro studies have indicated that this assay has an analytical sensitivity of 1%. References: Baxter EJ, Scott Phineas Real, et al. Acquired mutation of the tyrosine kinase JAK2 in human myeloproliferative disorders. Lancet. 2005 Mar 19-25; 365(9464):1054-1061. Alfonso Ramus Couedic JP. 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 Nature. 2005 Apr 28; 434(7037):1144-1148. Kralovics R, Passamonti F, Buser AS, et al. A gain-of-function mutation of JAK2 in myeloproliferative disorders. N Engl J Med. 2005 Apr 28;  352(17):1779-1790.   PTT-LA Mix     Status: None   Collection Time: 05/30/22 12:24 PM  Result Value Ref Range   PTT-LA Mix 38.8 0.0 - 40.5 sec    Comment: (NOTE) Performed At: [facility] [place], [place] [id number] Rush Farmer MD [id number]   PTT-LA Incub Mix     Status: Abnormal   Collection Time: 05/30/22 12:24 PM  Result Value Ref Range   PTT-LA Incub Mix 41.4 (H) 0.0 - 40.5 sec    Comment: (NOTE) Performed At: [facility] [place], [place] [id number] Rush Farmer MD [id number]   Hexagonal Phase Phospholipid     Status: None   Collection Time: 05/30/22 12:24 PM  Result Value Ref Range   Hexagonal Phase Phospholipid 6 0 - 11 sec    Comment: (NOTE) Performed At: [facility] [place], [place] [id number] Rush Farmer MD [id number]   dRVVT Mix     Status: Abnormal   Collection Time: 05/30/22 12:24 PM  Result Value Ref Range   dRVVT Mix 42.4 (H) 0.0 - 40.4 sec    Comment: (NOTE) Performed At: [facility] [place], [place] [id number] Rush Farmer MD [id number]   dRVVT Confirm     Status: None   Collection Time: 05/30/22 12:24 PM  Result Value Ref Range   dRVVT Confirm 1.0 0.8 - 1.2 ratio    Comment: (NOTE) Performed At: [facility] [place], [place] [id number] Rush Farmer MD [id number]   Lipid panel     Status: Abnormal   Collection Time: 06/12/22 11:40 AM  Result Value Ref Range   Cholesterol, Total 98 (L) 100 - 199 mg/dL   Triglycerides 116 0 - 149 mg/dL   HDL 38 (L) >39 mg/dL   VLDL Cholesterol Cal 21 5 - 40 mg/dL   LDL Chol Calc (NIH) 39 0 - 99 mg/dL   Chol/HDL Ratio 2.6 0.0 - 5.0 ratio    Comment:                                   T. Chol/HDL Ratio                                             Men  Women                               1/2 Avg.Risk  3.4    3.3                                   Avg.Risk  5.0    4.4  2X Avg.Risk  9.6    7.1                                3X Avg.Risk 23.4   11.0   Hepatic function panel     Status: Abnormal   Collection Time: 06/12/22 11:40 AM  Result Value Ref Range   Total Protein 7.7 6.0 - 8.5 g/dL   Albumin 4.5 4.1 - 5.2 g/dL   Bilirubin Total 0.5 0.0 - 1.2 mg/dL   Bilirubin, Direct 0.18 0.00 - 0.40 mg/dL   Alkaline Phosphatase 102 44 - 121 IU/L   AST 54 (H) 0 - 40 IU/L   ALT 38 0 - 44 IU/L  Basic metabolic panel     Status: Abnormal   Collection Time: 06/26/22 12:13 PM  Result Value Ref Range   Sodium 135 135 - 145 mmol/L   Potassium 4.2 3.5 - 5.1 mmol/L   Chloride 99 98 - 111 mmol/L   CO2 27 22 - 32 mmol/L   Glucose, Bld 116 (H) 70 - 99 mg/dL    Comment: Glucose reference range applies only to samples taken after fasting for at least 8 hours.   BUN 15 6 - 20 mg/dL   Creatinine, Ser 1.11 0.61 - 1.24 mg/dL   Calcium 9.5 8.9 - 10.3 mg/dL   GFR, Estimated >60 >60 mL/min    Comment: (NOTE) Calculated using the CKD-EPI Creatinine Equation (2021)    Anion gap 9 5 - 15    Comment: Performed at [facility] [address] [place], [place] [address]  Brain natriuretic peptide     Status: None   Collection Time: 06/26/22 12:13 PM  Result Value Ref Range   B Natriuretic Peptide 73.5 0.0 - 100.0 pg/mL    Comment: Performed at [facility] [address], [place], [place] [address]  Digoxin level     Status: Abnormal   Collection Time: 06/26/22 12:13 PM  Result Value Ref Range   Digoxin Level 0.4 (L) 0.8 - 2.0 ng/mL    Comment: Performed at [facility], [place] [address], [place], [place] [address]    Radiology No results found.  Assessment/Plan HTN (hypertension) blood pressure control important in reducing the progression of atherosclerotic disease. On appropriate oral medications.     DM II (diabetes mellitus, type II), controlled ([place]) blood glucose control important in reducing the progression of  atherosclerotic disease. Also, involved in wound healing. On appropriate medications.  PE (pulmonary thromboembolism) ([place]) The patient has had 2 pulmonary embolus while off of anticoagulation at this point.  He had thrombectomy about a year ago.  He needs to remain on anticoagulation indefinitely with his recurrent thromboembolic issues and is markedly reduced ejection fraction from his cardiomyopathy.  I will see him back as needed.    Leotis Pain, MD  07/31/2022 3:45 PM    This note was created with Dragon medical transcription system.  Any errors from dictation are purely unintentional

## 2022-07-31 NOTE — Telephone Encounter (Signed)
Patient's girlfriend, on DPR, called stating the pharmacy cannot get Ozempic 2mg . I advised there is some supply issues with Ozempic 2mg . She can call around and see if she can find it somewhere else or I can decrease to 1mg  until the 2mg  is available. She will call around and let me know.

## 2022-07-31 NOTE — Assessment & Plan Note (Signed)
The patient has had 2 pulmonary embolus while off of anticoagulation at this point.  He had thrombectomy about a year ago.  He needs to remain on anticoagulation indefinitely with his recurrent thromboembolic issues and is markedly reduced ejection fraction from his cardiomyopathy.  I will see him back as needed.

## 2022-08-01 ENCOUNTER — Encounter: Payer: 59 | Attending: Internal Medicine | Admitting: *Deleted

## 2022-08-01 DIAGNOSIS — I5022 Chronic systolic (congestive) heart failure: Secondary | ICD-10-CM | POA: Insufficient documentation

## 2022-08-01 NOTE — Progress Notes (Signed)
Discharge  Note: Robert Mccann 1996-02-26  [facility] graduated today from  rehab with 36 sessions completed.  Details of the patient's exercise prescription and what He needs to do in order to continue the prescription and progress were discussed with patient.  Patient was given a copy of prescription and goals.  Patient verbalized understanding.  Drayson plans to continue to exercise by walking and biking.   6 Minute Walk     Row Name 04/19/22 1617 07/20/22 1131       6 Minute Walk   Phase Initial Discharge    Distance 1020 feet 1530 feet    Walk Time 6 minutes 6 minutes    # of Rest Breaks 0 0    MPH 1.93 2.89    METS 3.88 4.61    RPE 9 13    Perceived Dyspnea  0 0    VO2 Peak 13.6 16.13    Symptoms Yes (comment) Yes (comment)    Comments Left calf pain 3/10 leg fatigue    Resting HR 92 bpm 81 bpm    Resting BP 104/70 108/62    Resting Oxygen Saturation  98 % 98 %    Exercise Oxygen Saturation  during 6 min walk 98 % 94 %    Max Ex. HR 119 bpm 114 bpm    Max Ex. BP 132/74 126/74    2 Minute Post BP 108/78 --            Thank you for referral. We enjoyed working with Robert Mccann.

## 2022-08-01 NOTE — Progress Notes (Signed)
Daily Session Note  Patient Details  Name: Robert Mccann MRN: [id number] Date of Birth: [birth year] Referring Provider:   Flowsheet Row Cardiac Rehab from 04/19/2022 in [facility]  Referring Provider Robert Bickers MD       Encounter Date: 08/01/2022  Check In:  Session Check In - 08/01/22 1137       Check-In   Supervising physician immediately available to respond to emergencies See telemetry face sheet for immediately available ER MD    Location [facility]-Cardiac & Pulmonary Rehab    Staff Present Robert Mccann, RDN, LDN;Robert Mccann, BS, RRT, CPFT;Robert Mccann Robert Burger, RN BSN;Robert Mccann, BS, Exercise Physiologist    Virtual Visit No    Medication changes reported     No    Fall or balance concerns reported    No    Warm-up and Cool-down Performed on first and last piece of equipment    Resistance Training Performed Yes    VAD Patient? No    PAD/SET Patient? No      Pain Assessment   Currently in Pain? No/denies                Social History   Tobacco Use  Smoking Status Never  Smokeless Tobacco Never    Goals Met:  Independence with exercise equipment Exercise tolerated well No report of concerns or symptoms today Strength training completed today  Goals Unmet:  Not Applicable  Comments:  Robert Mccann graduated today from  rehab with 36 sessions completed.  Details of the patient's exercise prescription and what He needs to do in order to continue the prescription and progress were discussed with patient.  Patient was given a copy of prescription and goals.  Patient verbalized understanding.  Robert Mccann plans to continue to exercise by walking and biking.    Dr. Emily Mccann is Medical Director for [place].  Dr. Ottie Mccann is Medical Director for [facility] Pulmonary Rehabilitation.

## 2022-08-01 NOTE — Progress Notes (Signed)
Cardiac Individual Treatment Plan  Patient Details  Name: Robert Mccann MRN: [id number] Date of Birth: June 10, 1996 Referring Provider:   Flowsheet Row Cardiac Rehab from 04/19/2022 in [facility] Cardiac and Pulmonary Rehab  Referring Provider Glori Bickers MD       Initial Encounter Date:  Flowsheet Row Cardiac Rehab from 04/19/2022 in [facility] Cardiac and Pulmonary Rehab  Date 04/19/22       Visit Diagnosis: Heart failure, chronic systolic ([place])  Patient's Home Medications on Admission:  Current Outpatient Medications:    apixaban (ELIQUIS) 5 MG TABS tablet, Take 1 tablet (5 mg total) by mouth 2 (two) times daily., Disp: 60 tablet, Rfl: 6   Bempedoic Acid-Ezetimibe (NEXLIZET) 180-10 MG TABS, Take 1 tablet by mouth daily., Disp: 90 tablet, Rfl: 3   Blood Glucose Monitoring Suppl (BLOOD GLUCOSE MONITOR SYSTEM) w/Device KIT, Use up to four times daily as directed. (Patient not taking: Reported on 06/26/2022), Disp: 1 kit, Rfl: 3   digoxin (LANOXIN) 0.125 MG tablet, Take 1 tablet (0.125 mg total) by mouth daily., Disp: 30 tablet, Rfl: 6   FARXIGA 10 MG TABS tablet, Take 10 mg by mouth daily., Disp: , Rfl:    furosemide (LASIX) 20 MG tablet, Take 4 tablets (80 mg total) by mouth daily., Disp: 120 tablet, Rfl: 6   metFORMIN (GLUCOPHAGE) 500 MG tablet, Take 1 tablet (500 mg total) by mouth 2 (two) times daily with a meal., Disp: 60 tablet, Rfl: 0   potassium chloride (KLOR-CON) 20 MEQ packet, Take 60 mEq by mouth daily., Disp: 90 packet, Rfl: 6   rosuvastatin (CRESTOR) 40 MG tablet, Take 1 tablet (40 mg total) by mouth daily., Disp: 90 tablet, Rfl: 3   sacubitril-valsartan (ENTRESTO) 97-103 MG, Take 1 tablet by mouth 2 (two) times daily., Disp: 60 tablet, Rfl: 6   Semaglutide, 2 MG/DOSE, (OZEMPIC, 2 MG/DOSE,) 8 MG/3ML SOPN, Inject 2 mg into the skin once a week., Disp: 3 mL, Rfl: 11   spironolactone (ALDACTONE) 25 MG tablet, Take 1 tablet (25 mg total) by mouth daily., Disp: 30 tablet, Rfl:  6  Past Medical History: Past Medical History:  Diagnosis Date   AV block, [place]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 05/2021 noted during periods of sleep @ time of hospitalization for PE.   Cardiomyopathy ([place])    a. 05/2021 Echo: EF <20%, glob HK. [place]:C ratio of 2.1:1 suggesting non-compaction. Gr2 DD. Sev reduced RV fxn. Nl PASP. Mildly dil RA. Mild MR; b. 10/2021 Echo: EF <20%, GrII DD; c. 10/2021 TEE: EF 20-25%, glob HK, sev red RV fxn.   Diabetes mellitus without complication ([place])    HFrEF (heart failure with reduced ejection fraction) ([place])    a. 05/2021 Echo: EF <20%. ? non-compaction.   Hypertension    Pulmonary embolism ([place])    a. 05/2021 CTA Chest: PE extending from lobar arteris of RUL and RLL w/ concern for pulml infarct-->s/p thrombolysis and thrombectomy of RUL/RML/RLL.   Stroke [facility])    a. 10/2021 CT Head: Bilat cerebral infarcts w/ mod-sided acute to subacute R temporoparietal infarct; b. 10/2021 MRI: R post PCA territory infarcts w/o hemorrhage; c. 10/2021 TEE: Neg bubble study. No LA/LAA thrombus. EF 20-25%, glob HK.    Tobacco Use: Social History   Tobacco Use  Smoking Status Never  Smokeless Tobacco Never    Labs: Review Flowsheet  More data exists      Latest Ref Rng & Units 03/19/2022 03/20/2022 03/21/2022 04/30/2022 06/12/2022  Labs for ITP Cardiac and Pulmonary Rehab  Cholestrol 100 - 199 mg/dL - - - 154  98   LDL (calc) 0 - 99 mg/dL - - - 80  39   HDL-C >39 mg/dL - - - 40  38   Trlycerides 0 - 149 mg/dL - - - 199  116   O2 Saturation % 71.7  86.7  68.4  - -     Exercise Target Goals: Exercise Program Goal: Individual exercise prescription set using results from initial 6 min walk test and THRR while considering  patient's activity barriers and safety.   Exercise Prescription Goal: Initial exercise prescription builds to 30-45 minutes a day of aerobic activity, 2-3 days per week.  Home exercise guidelines will be given to patient during program as part of exercise prescription that  the participant will acknowledge.   Education: Aerobic Exercise: - Group verbal and visual presentation on the components of exercise prescription. Introduces F.I.T.T principle from ACSM for exercise prescriptions.  Reviews F.I.T.T. principles of aerobic exercise including progression. Written material given at graduation. Flowsheet Row Cardiac Rehab from 06/20/2022 in [facility] Cardiac and Pulmonary Rehab  Education need identified 04/19/22  Date 05/02/22  Educator [facility]  Instruction Review Code 1- [employer] Understanding       Education: Resistance Exercise: - Group verbal and visual presentation on the components of exercise prescription. Introduces F.I.T.T principle from ACSM for exercise prescriptions  Reviews F.I.T.T. principles of resistance exercise including progression. Written material given at graduation. Flowsheet Row Cardiac Rehab from 06/20/2022 in [facility] Cardiac and Pulmonary Rehab  Date 05/09/22  Educator [facility]  Instruction Review Code 1- Verbalizes Understanding        Education: Exercise & Equipment Safety: - Individual verbal instruction and demonstration of equipment use and safety with use of the equipment. Flowsheet Row Cardiac Rehab from 06/20/2022 in [facility] Cardiac and Pulmonary Rehab  Education need identified 04/19/22  Date 04/19/22  Educator [place]  Instruction Review Code 1- Verbalizes Understanding       Education: Exercise Physiology & General Exercise Guidelines: - Group verbal and written instruction with models to review the exercise physiology of the cardiovascular system and associated critical values. Provides general exercise guidelines with specific guidelines to those with heart or lung disease.  Flowsheet Row Cardiac Rehab from 06/20/2022 in [facility] Cardiac and Pulmonary Rehab  Education need identified 04/19/22       Education: Flexibility, Balance, Mind/Body Relaxation: - Group verbal and visual presentation with interactive activity on the components of  exercise prescription. Introduces F.I.T.T principle from ACSM for exercise prescriptions. Reviews F.I.T.T. principles of flexibility and balance exercise training including progression. Also discusses the mind body connection.  Reviews various relaxation techniques to help reduce and manage stress (i.e. Deep breathing, progressive muscle relaxation, and visualization). Balance handout provided to take home. Written material given at graduation. Flowsheet Row Cardiac Rehab from 07/18/2022 in [facility] Cardiac and Pulmonary Rehab  Date 07/18/22  Educator [facility]  Instruction Review Code 1- Verbalizes Understanding       Activity Barriers & Risk Stratification:  Activity Barriers & Cardiac Risk Stratification - 04/19/22 1617       Activity Barriers & Cardiac Risk Stratification   Activity Barriers Decreased Ventricular Function;Deconditioning;Muscular Weakness    Comments calf muscle left leg (MD aware)    Cardiac Risk Stratification High             6 Minute Walk:  6 Minute Walk     Row Name 04/19/22 1617 07/20/22 1131       6 Minute  Walk   Phase Initial Discharge    Distance 1020 feet 1530 feet    Walk Time 6 minutes 6 minutes    # of Rest Breaks 0 0    MPH 1.93 2.89    METS 3.88 4.61    RPE 9 13    Perceived Dyspnea  0 0    VO2 Peak 13.6 16.13    Symptoms Yes (comment) Yes (comment)    Comments Left calf pain 3/10 leg fatigue    Resting HR 92 bpm 81 bpm    Resting BP 104/70 108/62    Resting Oxygen Saturation  98 % 98 %    Exercise Oxygen Saturation  during 6 min walk 98 % 94 %    Max Ex. HR 119 bpm 114 bpm    Max Ex. BP 132/74 126/74    2 Minute Post BP 108/78 --             Oxygen Initial Assessment:   Oxygen Re-Evaluation:   Oxygen Discharge (Final Oxygen Re-Evaluation):   Initial Exercise Prescription:  Initial Exercise Prescription - 04/19/22 1600       Date of Initial Exercise RX and Referring Provider   Date 04/19/22    Referring Provider Arvilla Meres MD      Oxygen   Maintain Oxygen Saturation 88% or higher      NuStep   Level 1    SPM 80    Minutes 15    METs 3.8      REL-XR   Level 1    Speed 50    Minutes 15    METs 3.8      T5 Nustep   Level 1    SPM 80    Minutes 15    METs 3.8      Track   Laps 21   As tolerated - take breaks as needed   Minutes 15    METs 2.14      Prescription Details   Frequency (times per week) 3    Duration Progress to 30 minutes of continuous aerobic without signs/symptoms of physical distress      Intensity   THRR 40-80% of Max Heartrate 132 - 162    Ratings of Perceived Exertion 11-13    Perceived Dyspnea 0-4      Progression   Progression Continue to progress workloads to maintain intensity without signs/symptoms of physical distress.      Resistance Training   Training Prescription Yes    Weight 3 lb    Reps 10-15             Perform Capillary Blood Glucose checks as needed.  Exercise Prescription Changes:   Exercise Prescription Changes     Row Name 04/19/22 1600 04/24/22 1600 05/08/22 1500 05/14/22 1100 05/21/22 1600     Response to Exercise   Blood Pressure (Admit) 104/70 110/78 110/64 -- 116/72   Blood Pressure (Exercise) 132/74 122/78 158/62 -- 124/82   Blood Pressure (Exit) 108/78 104/74 104/60 -- 106/64   Heart Rate (Admit) 92 bpm 93 bpm 103 bpm -- 96 bpm   Heart Rate (Exercise) 119 bpm 122 bpm 133 bpm -- 114 bpm   Heart Rate (Exit) 96 bpm 99 bpm 96 bpm -- 87 bpm   Oxygen Saturation (Admit) 98 % -- -- -- --   Oxygen Saturation (Exercise) 98 % -- -- -- --   Rating of Perceived Exertion (Exercise) 9 9 13  -- 11   Perceived Dyspnea (  Exercise) 0 -- -- -- --   Symptoms Left calf pain 3/10 none none -- none   Comments walk test results first full day of exercise -- -- --   Duration -- Progress to 30 minutes of  aerobic without signs/symptoms of physical distress Continue with 30 min of aerobic exercise without signs/symptoms of physical distress. --  Continue with 30 min of aerobic exercise without signs/symptoms of physical distress.   Intensity -- THRR unchanged THRR unchanged -- THRR unchanged     Progression   Progression -- Continue to progress workloads to maintain intensity without signs/symptoms of physical distress. Continue to progress workloads to maintain intensity without signs/symptoms of physical distress. -- Continue to progress workloads to maintain intensity without signs/symptoms of physical distress.   Average METs -- 2.07 2.5 -- 2.66     Resistance Training   Training Prescription -- Yes Yes -- Yes   Weight -- 3 lb 3 lb -- 7 lb   Reps -- 10-15 10-15 -- 10-15     Interval Training   Interval Training -- No No -- No     Treadmill   MPH -- -- 1.5 -- 1.7   Grade -- -- 2.5 -- 2.5   Minutes -- -- 15 -- 15   METs -- -- 2.67 -- 2.89     NuStep   Level -- 1 3 -- 4   Minutes -- 15 15 -- 15   METs -- 2 2.5 -- 3.5     Recumbant Elliptical   Level -- -- 2 -- 3   Minutes -- -- 15 -- 15   METs -- -- -- -- 1.6     Track   Laps -- 21 21 -- --   Minutes -- 15 15 -- --   METs -- 2.14 2.14 -- --     Home Exercise Plan   Plans to continue exercise at -- -- -- Home (comment)  walking and bike Home (comment)  walking and bike   Frequency -- -- -- Add 2 additional days to program exercise sessions. Add 2 additional days to program exercise sessions.   Initial Home Exercises Provided -- -- -- 05/14/22 05/14/22     Oxygen   Maintain Oxygen Saturation -- 88% or higher 88% or higher -- 88% or higher    Row Name 06/04/22 1000 06/18/22 1400 07/02/22 0800 07/16/22 0800 07/30/22 0800     Response to Exercise   Blood Pressure (Admit) 104/62 124/70 122/62 126/66 126/62   Blood Pressure (Exercise) 134/60 -- -- -- --   Blood Pressure (Exit) 100/56 110/62 118/62 120/64 124/60   Heart Rate (Admit) 86 bpm 87 bpm 82 bpm 108 bpm 89 bpm   Heart Rate (Exercise) 145 bpm 108 bpm 119 bpm 116 bpm 126 bpm   Heart Rate (Exit) 108 bpm 89  bpm 93 bpm 100 bpm 90 bpm   Rating of Perceived Exertion (Exercise) 13 13 11 12 13    Symptoms none none none none none   Duration Continue with 30 min of aerobic exercise without signs/symptoms of physical distress. Continue with 30 min of aerobic exercise without signs/symptoms of physical distress. Continue with 30 min of aerobic exercise without signs/symptoms of physical distress. Continue with 30 min of aerobic exercise without signs/symptoms of physical distress. Continue with 30 min of aerobic exercise without signs/symptoms of physical distress.   Intensity THRR unchanged THRR unchanged THRR unchanged THRR unchanged THRR unchanged     Progression   Progression Continue to  progress workloads to maintain intensity without signs/symptoms of physical distress. Continue to progress workloads to maintain intensity without signs/symptoms of physical distress. Continue to progress workloads to maintain intensity without signs/symptoms of physical distress. Continue to progress workloads to maintain intensity without signs/symptoms of physical distress. Continue to progress workloads to maintain intensity without signs/symptoms of physical distress.   Average METs 3.74 3.07 2.65 3.93 3.78     Resistance Training   Training Prescription Yes Yes Yes Yes Yes   Weight 7 lb 7 lb 7 lb 7 lb 7 lb   Reps 10-15 10-15 10-15 10-15 10-15     Interval Training   Interval Training Yes No No No No   Equipment Treadmill -- -- -- --   Comments up to 4% incline -- -- -- --     Treadmill   MPH 1.7 2.4 2.5 1.7 2.4   Grade 4  intervals 4 1 1.5 1   Minutes $Remove'15 15 15 15 15   'QrzAMIl$ METs 3.24 4.16 3.26 2.65 3.17     NuStep   Level $Remo'5 5 4 5 6   'ZeNip$ Minutes $Rem'15 15 15 15 15   'jBsl$ METs 4.1 2.3 2.2 5.2 6.4     Recumbant Elliptical   Level -- 2.5 -- -- 4   Minutes -- 15 -- -- 15   METs -- 3.2 -- -- 2     REL-XR   Level -- 6 -- -- 6   Minutes -- 15 -- -- 15   METs -- 2.1 -- -- 4.8     Track   Laps -- 17 17 -- --   Minutes  -- 15 15 -- --   METs -- 1.92 1.92 -- --     Home Exercise Plan   Plans to continue exercise at Home (comment)  walking and bike Home (comment)  walking and bike Home (comment)  walking and bike Home (comment)  walking and bike Home (comment)  walking and bike   Frequency Add 2 additional days to program exercise sessions. Add 2 additional days to program exercise sessions. Add 2 additional days to program exercise sessions. Add 2 additional days to program exercise sessions. Add 2 additional days to program exercise sessions.   Initial Home Exercises Provided 05/14/22 05/14/22 05/14/22 05/14/22 05/14/22     Oxygen   Maintain Oxygen Saturation 88% or higher 88% or higher 88% or higher 88% or higher 88% or higher            Exercise Comments:   Exercise Goals and Review:   Exercise Goals     Row Name 04/19/22 1628             Exercise Goals   Increase Physical Activity Yes       Intervention Provide advice, education, support and counseling about physical activity/exercise needs.;Develop an individualized exercise prescription for aerobic and resistive training based on initial evaluation findings, risk stratification, comorbidities and participant's personal goals.       Expected Outcomes Short Term: Attend rehab on a regular basis to increase amount of physical activity.;Long Term: Add in home exercise to make exercise part of routine and to increase amount of physical activity.;Long Term: Exercising regularly at least 3-5 days a week.       Increase Strength and Stamina Yes       Intervention Provide advice, education, support and counseling about physical activity/exercise needs.;Develop an individualized exercise prescription for aerobic and resistive training based on initial evaluation findings, risk stratification, comorbidities  and participant's personal goals.       Expected Outcomes Short Term: Increase workloads from initial exercise prescription for resistance, speed,  and METs.;Short Term: Perform resistance training exercises routinely during rehab and add in resistance training at home;Long Term: Improve cardiorespiratory fitness, muscular endurance and strength as measured by increased METs and functional capacity (6MWT)       Able to understand and use rate of perceived exertion (RPE) scale Yes       Intervention Provide education and explanation on how to use RPE scale       Expected Outcomes Short Term: Able to use RPE daily in rehab to express subjective intensity level;Long Term:  Able to use RPE to guide intensity level when exercising independently       Able to understand and use Dyspnea scale Yes       Intervention Provide education and explanation on how to use Dyspnea scale       Expected Outcomes Short Term: Able to use Dyspnea scale daily in rehab to express subjective sense of shortness of breath during exertion;Long Term: Able to use Dyspnea scale to guide intensity level when exercising independently       Knowledge and understanding of Target Heart Rate Range (THRR) Yes       Intervention Provide education and explanation of THRR including how the numbers were predicted and where they are located for reference       Expected Outcomes Short Term: Able to state/look up THRR;Long Term: Able to use THRR to govern intensity when exercising independently;Short Term: Able to use daily as guideline for intensity in rehab       Able to check pulse independently Yes       Intervention Provide education and demonstration on how to check pulse in carotid and radial arteries.;Review the importance of being able to check your own pulse for safety during independent exercise       Expected Outcomes Short Term: Able to explain why pulse checking is important during independent exercise;Long Term: Able to check pulse independently and accurately       Understanding of Exercise Prescription Yes       Intervention Provide education, explanation, and written  materials on patient's individual exercise prescription       Expected Outcomes Short Term: Able to explain program exercise prescription;Long Term: Able to explain home exercise prescription to exercise independently                Exercise Goals Re-Evaluation :  Exercise Goals Re-Evaluation     Row Name 04/23/22 1114 05/08/22 1607 05/14/22 1120 05/21/22 1612 06/04/22 1041     Exercise Goal Re-Evaluation   Exercise Goals Review Increase Physical Activity;Able to understand and use rate of perceived exertion (RPE) scale;Knowledge and understanding of Target Heart Rate Range (THRR);Understanding of Exercise Prescription;Increase Strength and Stamina;Able to check pulse independently Increase Physical Activity;Understanding of Exercise Prescription;Increase Strength and Stamina Increase Physical Activity;Understanding of Exercise Prescription;Increase Strength and Stamina;Able to understand and use rate of perceived exertion (RPE) scale;Able to understand and use Dyspnea scale;Knowledge and understanding of Target Heart Rate Range (THRR);Able to check pulse independently Increase Physical Activity;Increase Strength and Stamina;Understanding of Exercise Prescription Increase Physical Activity;Increase Strength and Stamina;Understanding of Exercise Prescription   Comments Reviewed RPE and dyspnea scales, THR and program prescription with pt today.  Pt voiced understanding and was given a copy of goals to take home. Crixus has been doing well during his first few weeks in rehab. He  improved his average METs to 2.5. He increased to Level 3 on the T4NS machine. He has begun to hit his THR while performing exercise. We advised pt to start with lower RPEs, he will now increase RPEs to normal level. We will continue to monitor pt as he progresses in the rehab program. Reviewed home exercise with pt today.  Pt plans to walk and use stationary bike at home for exercise.  Reviewed THR, pulse, RPE, sign and  symptoms, pulse oximetery and when to call 911 or MD.  Also discussed weather considerations and indoor options.  Pt voiced understanding. Fuad is doing well in rehab.  We have increased his RPE up to normal levels as he has not had symptoms in rehab. His weight has been on a downward trend as well.  He is up to level 3 on the recumbent elliptical.  We will continue to monitor his progress. Naeem continues to do well in rehab. He has been out the last week but was still able to improve on the T4 Nustep to level 5. He also started intervals on the treadmill working up to 4% incline.  He is also now up to 7 lbs for handweights. Will continue to monitor.   Expected Outcomes Short: Use RPE daily to regulate intensity. Long: Follow program prescription in THR. Short: increase RPEs during exercise. Long: Follow program to improve strength and stamina. Short: Start to add in exercise at home Long: Conitnue to exercise independently short: Try 2 mph on treadmill Long: continue to improve stamina and work on weight loss Short: Continue to increase workloads on treadmill Long: Continue to increase overall MET level    Row Name 06/18/22 1429 07/02/22 0817 07/11/22 1111 07/16/22 0811 07/20/22 1141     Exercise Goal Re-Evaluation   Exercise Goals Review Increase Physical Activity;Increase Strength and Stamina;Understanding of Exercise Prescription Increase Physical Activity;Increase Strength and Stamina;Understanding of Exercise Prescription Increase Physical Activity;Increase Strength and Stamina;Understanding of Exercise Prescription Increase Physical Activity;Increase Strength and Stamina;Understanding of Exercise Prescription Increase Physical Activity;Increase Strength and Stamina;Understanding of Exercise Prescription   Comments Partick is still doing well in rehab. He tried out the XR at level 6 and tolerated it well. He continues to work on the treadmill with a 4% incline. He hits his THR some sessions. He would  benefit from increasing his level on the REL. Will continue to monitor. Trever is doing well in rehab. He improved his speed on the treadmill to 2.5 mph. He has also done well with 7 lb hand weights for resistance training. We will continue to monitor his progress in the program. Kavan reports is not doing any structured exercise outside of rehab - he reports staying active and chasing his daughter around the park. He plans to go to [employer] outside of rehab soon. He also has a stationary bike and regular bike as well he can use. Encouraged to warm up and cool down. Olis is doing well in rehab. He increased his average MET level to 3.93 METs. He is also up to 5.2 METs at level 5 on the T4. He has tolerated using 7 lb hand weights for resistance training as well. We will continue to monitor his progress in the program. Talyn is doing well in rehab.  He improved his post walk by 510 ft!!  He is pleased with his progress.  He is going to [employer] on his off days and we talked about adding in intervals on the treadmill to help walk  faster.   Expected Outcomes Short: Increase level on REL Long: Continue to build up overall strength and stamina Short: add incline back on treadmill. Long: Continue to improve overall MET level. Short: Exercise outside of rehab at [place]: Continue to improve overall MET level. Short: Increase load on Treadmill. Long: Continue to increase strength and stamina. Short: Continue to work to graduation Long: Conitnue to exercise [place] Name 07/30/22 0836             Exercise Goal Re-Evaluation   Exercise Goals Review Increase Physical Activity;Increase Strength and Stamina;Understanding of Exercise Prescription       Comments Zalman is doing well in rehab. He recentcly improved on his 6MWT by 510 ft! He also improved to level 6 on the T4. He is up to 4.8 METs on the REXR as well. We will continue to monitor Marti's progress in the program until he  graduates from the program.       Expected Outcomes Short: graduate. Long: Continue to exercise independently.                Discharge Exercise Prescription (Final Exercise Prescription Changes):  Exercise Prescription Changes - 07/30/22 0800       Response to Exercise   Blood Pressure (Admit) 126/62    Blood Pressure (Exit) 124/60    Heart Rate (Admit) 89 bpm    Heart Rate (Exercise) 126 bpm    Heart Rate (Exit) 90 bpm    Rating of Perceived Exertion (Exercise) 13    Symptoms none    Duration Continue with 30 min of aerobic exercise without signs/symptoms of physical distress.    Intensity THRR unchanged      Progression   Progression Continue to progress workloads to maintain intensity without signs/symptoms of physical distress.    Average METs 3.78      Resistance Training   Training Prescription Yes    Weight 7 lb    Reps 10-15      Interval Training   Interval Training No      Treadmill   MPH 2.4    Grade 1    Minutes 15    METs 3.17      NuStep   Level 6    Minutes 15    METs 6.4      Recumbant Elliptical   Level 4    Minutes 15    METs 2      REL-XR   Level 6    Minutes 15    METs 4.8      Home Exercise Plan   Plans to continue exercise at Home (comment)   walking and bike   Frequency Add 2 additional days to program exercise sessions.    Initial Home Exercises Provided 05/14/22      Oxygen   Maintain Oxygen Saturation 88% or higher             Nutrition:  Target Goals: Understanding of nutrition guidelines, daily intake of sodium '1500mg'$ , cholesterol '200mg'$ , calories 30% from fat and 7% or less from saturated fats, daily to have 5 or more servings of fruits and vegetables.  Education: All About Nutrition: -Group instruction provided by verbal, written material, interactive activities, discussions, models, and posters to present general guidelines for heart healthy nutrition including fat, fiber, MyPlate, the role of sodium in heart  healthy nutrition, utilization of the nutrition label, and utilization of this knowledge for meal planning. Follow up email sent as  well. Written material given at graduation. Flowsheet Row Cardiac Rehab from 06/20/2022 in [facility] Cardiac and Pul[facility] Rehab  Date 05/23/22  Educator [facility]  Instruction Review Code 1- Verbalizes Understanding       Biometrics:  Pre Biometrics - 04/19/22 1616       Pre Biometrics   Height $Remov'5\' 11"'vTFdfi$  (1.803 m)    Weight 321 lb 3.2 oz (145.7 kg)    BMI (Calculated) 44.82             Post Biometrics - 07/20/22 1135        Post  Biometrics   Weight 328 lb 8 oz (149 kg)    Single Leg Stand 13.6 seconds             Nutrition Therapy Plan and Nutrition Goals:  Nutrition Therapy & Goals - 06/18/22 1148       Nutrition Therapy   Diet Heart healthy, low Na, T2DM MNT    Drug/Food Interactions Statins/Certain Fruits    Protein (specify units) 80g   used adjusted body weight   Fiber 30 grams    Whole Grain Foods 3 servings    Saturated Fats 16 max. grams    Fruits and Vegetables 8 servings/day    Sodium 2 grams      Personal Nutrition Goals   Nutrition Goal ST: limit sugar sweetened beverages, eat consistently during the day, practice MyPlate guidleines, refrigerate rice from chinese food place to have a better BG response  LT: meet nutritional needs while persuing weight loss, follow MyPlate guidelines, increase variety of foods, limit added sugar <36g/day    Comments 25 y.o. M admitted to cardiac rehab chronic systolic HF. PMHx includes HTN, NSTEMI, HFrEF, T2DM, HLD. Relevant medications includes biotin, metformin, farxiga, K+, crestor, ozempic. Latest A1C = 8.9%. PYP Score: 56. Vegetables & Fruits 9/12. Breads, Grains & Cereals 10/12. Red & Processed Meat 8/12. Poultry 2/2. Fish & Shellfish 2/4. Beans, Nuts & Seeds 4/4. Milk & Dairy Foods 2/6. Toppings, Oils, Seasonings & Salt 5/20. Sweets, Snacks & Restaurant Food 8/14. Beverages 6/10.  Fortunato typically  eats 2 meals per day: splits chinese food of shrimp and broccoli with white rice. Sometimes cereal - captain crunch, honey bunches of oats with whole milk. He drinks juice and water during the day - he has also bought trulemon to flavor his water. The ozempic has reduced his appetite as well as the weather. He reports that he has belching with foul odor - discussed how this could be due to reflux or certain medications, recommended to discuss this with his doctor. He also reports 20lb weight fluctuations in short amount of time - discussed how this was likely fluid and to tell his doctor- also advised to limit restaurant food as this can be very high in salt whcih can worsen fluid retention. Discussed T2DM MNT as well as heart healhty eating. Aiman would like to lose weight, but is having trouble doing so - discussed body's compensatory mechanisms and encouraged to focus on eating consistently, limit excess calories from sugar-sweetened beverages, following MyPlate guidelines for proportions of foods as well as encouraging more nutrient dense foods. Provided some calorie estimations, but emphasized as a starting point as calorie needs and weight management are more complicated - advised to not eat below his BMR.      Intervention Plan   Intervention Prescribe, educate and counsel regarding individualized specific dietary modifications aiming towards targeted core components such as weight, hypertension, lipid management, diabetes, heart failure and  other comorbidities.    Expected Outcomes Short Term Goal: Understand basic principles of dietary content, such as calories, fat, sodium, cholesterol and nutrients.;Short Term Goal: A plan has been developed with personal nutrition goals set during dietitian appointment.;Long Term Goal: Adherence to prescribed nutrition plan.             Nutrition Assessments:  MEDIFICTS Score Key: ?70 Need to make dietary changes  40-70 Heart Healthy Diet ? 40  Therapeutic Level Cholesterol Diet  Flowsheet Row Cardiac Rehab from 08/01/2022 in [facility] Cardiac and Pulmonary Rehab  Picture Your Plate Total Score on Discharge 54      Picture Your Plate Scores: <66 Unhealthy dietary pattern with much room for improvement. 41-50 Dietary pattern unlikely to meet recommendations for good health and room for improvement. 51-60 More healthful dietary pattern, with some room for improvement.  >60 Healthy dietary pattern, although there may be some specific behaviors that could be improved.    Nutrition Goals Re-Evaluation:  Nutrition Goals Re-Evaluation     [place] Name 05/14/22 1140 05/21/22 1149 07/11/22 1114 07/20/22 1144       Goals   Current Weight 319 lb (144.7 kg) -- -- --    Nutrition Goal Lose more weight., Cut back on fruit juice. -- ST: limit sugar sweetened beverages, eat consistently during the day, practice MyPlate guidleines, refrigerate rice from [place] food place to have a better BG response  LT: meet nutritional needs while persuing weight loss, follow MyPlate guidelines, increase variety of foods, limit added sugar <36g/day ST: limit sugar sweetened beverages, eat consistently during the day, practice MyPlate guidleines, refrigerate rice from chinese food place to have a better BG response  LT: meet nutritional needs while persuing weight loss, follow MyPlate guidelines, increase variety of foods, limit added sugar <36g/day    Comment Markees states that he loves juice. Informed him that it has alot of sugar and extra calories. He understands that he may be able to lose some weight with cutting out some of his juice intake. Gave him some guidance on other drinks to consider. Thorsten rescheduled nutrition appt. for 06/18/22. Letitia Libra has been cooking more at home and trying to limit [place] food. Zollie Scale met with Melissa and has started to adapt some of the changes and using MyPlate.    Expected Outcome Short: cut back on sugary juices. Long: adhere to a diet  that pertains to him. -- ST: limit sugar sweetened beverages, eat consistently during the day, practice MyPlate guidleines, refrigerate rice from chinese food place to have a better BG response  LT: meet nutritional needs while persuing weight loss, follow MyPlate guidelines, increase variety of foods, limit added sugar <36g/day Continue to use MyPlate guidelines and focus on heart healthy eatin g             Nutrition Goals Discharge (Final Nutrition Goals Re-Evaluation):  Nutrition Goals Re-Evaluation - 07/20/22 1144       Goals   Nutrition Goal ST: limit sugar sweetened beverages, eat consistently during the day, practice MyPlate guidleines, refrigerate rice from chinese food place to have a better BG response  LT: meet nutritional needs while persuing weight loss, follow MyPlate guidelines, increase variety of foods, limit added sugar <36g/day    Comment Wanyna met with Melissa and has started to adapt some of the changes and using MyPlate.    Expected Outcome Continue to use MyPlate guidelines and focus on heart healthy eatin g  Psychosocial: Target Goals: Acknowledge presence or absence of significant depression and/or stress, maximize coping skills, provide positive support system. Participant is able to verbalize types and ability to use techniques and skills needed for reducing stress and depression.   Education: Stress, Anxiety, and Depression - Group verbal and visual presentation to define topics covered.  Reviews how body is impacted by stress, anxiety, and depression.  Also discusses healthy ways to reduce stress and to treat/manage anxiety and depression.  Written material given at graduation. Flowsheet Row Cardiac Rehab from 06/20/2022 in [facility] Cardiac and Pulmonary Rehab  Date 06/20/22  Educator [facility]  Instruction Review Code 1- [employer] Understanding       Education: Sleep Hygiene -Provides group verbal and written instruction about how sleep can affect  your health.  Define sleep hygiene, discuss sleep cycles and impact of sleep habits. Review good sleep hygiene tips.    Initial Review & Psychosocial Screening:  Initial Psych Review & Screening - 04/16/22 1412       Initial Review   Current issues with None Identified      Family Dynamics   Good Support System? Yes   parents and girlfriend     Barriers   Psychosocial barriers to participate in program There are no identifiable barriers or psychosocial needs.;The patient should benefit from training in stress management and relaxation.      Screening Interventions   Interventions Encouraged to exercise;Provide feedback about the scores to participant;To provide support and resources with identified psychosocial needs    Expected Outcomes Short Term goal: Utilizing psychosocial counselor, staff and physician to assist with identification of specific Stressors or current issues interfering with healing process. Setting desired goal for each stressor or current issue identified.;Long Term Goal: Stressors or current issues are controlled or eliminated.;Short Term goal: Identification and review with participant of any Quality of Life or Depression concerns found by scoring the questionnaire.;Long Term goal: The participant improves quality of Life and PHQ9 Scores as seen by post scores and/or verbalization of changes             Quality of Life Scores:   Quality of Life - 08/01/22 1113       Quality of Life   Select Quality of Life      Quality of Life Scores   Health/Function Post 25.8 %    Socioeconomic Post 21.43 %    Psych/Spiritual Post 30 %    Family Post 27.6 %    GLOBAL Post 26.03 %            Scores of 19 and below usually indicate a poorer quality of life in these areas.  A difference of  2-3 points is a clinically meaningful difference.  A difference of 2-3 points in the total score of the Quality of Life Index has been associated with significant improvement in  overall quality of life, self-image, physical symptoms, and general health in studies assessing change in quality of life.  PHQ-9: Review Flowsheet       08/01/2022 04/19/2022 01/05/2022 07/11/2021  Depression screen PHQ 2/9  Decreased Interest 0 0 0 0  Down, Depressed, Hopeless 0 0 0 0  PHQ - 2 Score 0 0 0 0  Altered sleeping 1 0 - -  Tired, decreased energy 1 1 - -  Change in appetite 1 1 - -  Feeling bad or failure about yourself  0 0 - -  Trouble concentrating 0 0 - -  Moving slowly or fidgety/restless 0  1 - -  Suicidal thoughts 0 0 - -  PHQ-9 Score 3 3 - -  Difficult doing work/chores Somewhat difficult Not difficult at all - -   Interpretation of Total Score  Total Score Depression Severity:  1-4 = Minimal depression, 5-9 = Mild depression, 10-14 = Moderate depression, 15-19 = Moderately severe depression, 20-27 = Severe depression   Psychosocial Evaluation and Intervention:  Psychosocial Evaluation - 04/16/22 1417       Psychosocial Evaluation & Interventions   Interventions Encouraged to exercise with the program and follow exercise prescription    Comments Mr. Gervasi is coming to cardiac rehab with the diagnosis of systolic heart failure. He mentioned a possible heart transplant in the future, so he wants to work on getting his weight down to at least 260lb and boost his stamina. He states his parents and his girlfriend are very supportive. 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 He reports he feels like he is handling all these medical issues okay and just takes it day by day.    Expected Outcomes Short: attend cardiac rehab for education and exercise. Long; develop and maintain positive self care habits.    Continue Psychosocial Services  Follow up required by staff             Psychosocial Re-Evaluation:  Psychosocial Re-Evaluation     [place] Name 05/14/22 1134 05/25/22 1125 07/11/22 1121 07/20/22 1143        Psychosocial Re-Evaluation   Current issues with None Identified None Identified None Identified None Identified    Comments Pete is a happy, hungry and tired. He loves comedy and he strives to be positive. He loves to play video games with his child and is very happy doing so. Damario is determined to get a heart transplant.Marland Kitchen He is staying positive and is ready to continue exercise. Other than his motivation getting more positive he has no mental health changes. He is staying positive and is ready to continue exercise. He relies on his girlfriend and parents for support. He reports his family and daughter keep him staying positive. He usually sleeps well, but when he was wearing the heart monitor it was waking him up as it was uncomfortable. Michae has been doing well in rehab and feels good with his progress.  He improved his post walk and was really surprised at how well he has done.  He continues to have a positive outlook and good support.  He continues to sleep well.    Expected Outcomes Short: Continue to exercise regularly to support mental health and notify staff of any changes. Long: maintain mental health and well being through teaching of rehab or prescribed medications independently. Short: maintain a positive attitude throughout heartTrack. Long: continue to keep a positive outlook on his mental health. Short: maintain a positive attitude throughout heartTrack. Long: continue to keep a positive outlook on his mental health. Short: Continue to keep moving Long: COnitnue to stay positive    Interventions Encouraged to attend Cardiac Rehabilitation for the exercise Encouraged to attend Cardiac Rehabilitation for the exercise Encouraged to attend Cardiac Rehabilitation for the exercise Encouraged to attend Cardiac Rehabilitation for the exercise    Continue Psychosocial Services  Follow up required by staff Follow up required by staff Follow up required by staff --             Psychosocial  Discharge (Final Psychosocial Re-Evaluation):  Psychosocial  Re-Evaluation - 07/20/22 1143       Psychosocial Re-Evaluation   Current issues with None Identified    Comments Salman has been doing well in rehab and feels good with his progress.  He improved his post walk and was really surprised at how well he has done.  He continues to have a positive outlook and good support.  He continues to sleep well.    Expected Outcomes Short: Continue to keep moving Long: COnitnue to stay positive    Interventions Encouraged to attend Cardiac Rehabilitation for the exercise             Vocational Rehabilitation: Provide vocational rehab assistance to qualifying candidates.   Vocational Rehab Evaluation & Intervention:  Vocational Rehab - 04/16/22 1410       Initial Vocational Rehab Evaluation & Intervention   Assessment shows need for Vocational Rehabilitation No             Education: Education Goals: Education classes will be provided on a variety of topics geared toward better understanding of heart health and risk factor modification. Participant will state understanding/return demonstration of topics presented as noted by education test scores.  Learning Barriers/Preferences:  Learning Barriers/Preferences - 04/16/22 1410       Learning Barriers/Preferences   Learning Barriers Sight   left eye issues after stroke   Learning Preferences None             General Cardiac Education Topics:  AED/CPR: - Group verbal and written instruction with the use of models to demonstrate the basic use of the AED with the basic ABC's of resuscitation.   Anatomy and Cardiac Procedures: - Group verbal and visual presentation and models provide information about basic cardiac anatomy and function. Reviews the testing methods done to diagnose heart disease and the outcomes of the test results. Describes the treatment choices: Medical Management, Angioplasty, or Coronary Bypass Surgery for  treating various heart conditions including Myocardial Infarction, Angina, Valve Disease, and Cardiac Arrhythmias.  Written material given at graduation. Flowsheet Row Cardiac Rehab from 06/20/2022 in [facility] Cardiac and Pulmonary Rehab  Education need identified 04/19/22  Date 05/09/22  Educator SB  Instruction Review Code 1- Verbalizes Understanding       Medication Safety: - Group verbal and visual instruction to review commonly prescribed medications for heart and lung disease. Reviews the medication, class of the drug, and side effects. Includes the steps to properly store meds and maintain the prescription regimen.  Written material given at graduation.   Intimacy: - Group verbal instruction through game format to discuss how heart and lung disease can affect sexual intimacy. Written material given at graduation.. Flowsheet Row Cardiac Rehab from 06/20/2022 in [facility] Cardiac and Pulmonary Rehab  Date 05/02/22  Educator [facility]  Instruction Review Code 1- Verbalizes Understanding       Know Your Numbers and Heart Failure: - Group verbal and visual instruction to discuss disease risk factors for cardiac and pulmonary disease and treatment options.  Reviews associated critical values for Overweight/Obesity, Hypertension, Cholesterol, and Diabetes.  Discusses basics of heart failure: signs/symptoms and treatments.  Introduces Heart Failure Zone chart for action plan for heart failure.  Written material given at graduation. Flowsheet Row Cardiac Rehab from 06/20/2022 in [facility] Cardiac and Pulmonary Rehab  Date 06/06/22  Educator SB  Instruction Review Code 1- Verbalizes Understanding       Infection Prevention: - Provides verbal and written material to individual with discussion of infection control including proper hand washing  and proper equipment cleaning during exercise session. Flowsheet Row Cardiac Rehab from 06/20/2022 in [facility] Cardiac and Pulmonary Rehab  Education need identified  04/19/22  Date 04/19/22  Educator [place]  Instruction Review Code 1- Verbalizes Understanding       Falls Prevention: - Provides verbal and written material to individual with discussion of falls prevention and safety. Flowsheet Row Cardiac Rehab from 06/20/2022 in [facility] Cardiac and Pulmonary Rehab  Education need identified 04/19/22  Date 04/19/22  Educator [place]  Instruction Review Code 1- Verbalizes Understanding       Other: -Provides group and verbal instruction on various topics (see comments)   Knowledge Questionnaire Score:  Knowledge Questionnaire Score - 08/01/22 1112       Knowledge Questionnaire Score   Post Score 23/26             Core Components/Risk Factors/Patient Goals at Admission:  Personal Goals and Risk Factors at Admission - 04/19/22 1630       Core Components/Risk Factors/Patient Goals on Admission    Weight Management Yes;Weight Loss;Obesity    Intervention Weight Management: Develop a combined nutrition and exercise program designed to reach desired caloric intake, while maintaining appropriate intake of nutrient and fiber, sodium and fats, and appropriate energy expenditure required for the weight goal.;Weight Management: Provide education and appropriate resources to help participant work on and attain dietary goals.;Weight Management/Obesity: Establish reasonable short term and long term weight goals.;Obesity: Provide education and appropriate resources to help participant work on and attain dietary goals.    Admit Weight 321 lb (145.6 kg)    Goal Weight: Short Term 315 lb (142.9 kg)    Goal Weight: Long Term 260 lb (117.9 kg)    Expected Outcomes Long Term: Adherence to nutrition and physical activity/exercise program aimed toward attainment of established weight goal;Short Term: Continue to assess and modify interventions until short term weight is achieved;Weight Loss: Understanding of general recommendations for a balanced deficit meal plan, which  promotes 1-2 lb weight loss per week and includes a negative energy balance of [id number] kcal/d;Understanding recommendations for meals to include 15-35% energy as protein, 25-35% energy from fat, 35-60% energy from carbohydrates, less than $RemoveB'200mg'yunzghXO$  of dietary cholesterol, 20-35 gm of total fiber daily;Understanding of distribution of calorie intake throughout the day with the consumption of 4-5 meals/snacks    Diabetes Yes    Intervention Provide education about signs/symptoms and action to take for hypo/hyperglycemia.;Provide education about proper nutrition, including hydration, and aerobic/resistive exercise prescription along with prescribed medications to achieve blood glucose in normal ranges: Fasting glucose 65-99 mg/dL    Expected Outcomes Short Term: Participant verbalizes understanding of the signs/symptoms and immediate care of hyper/hypoglycemia, proper foot care and importance of medication, aerobic/resistive exercise and nutrition plan for blood glucose control.;Long Term: Attainment of HbA1C < 7%.    Heart Failure Yes    Intervention Provide a combined exercise and nutrition program that is supplemented with education, support and counseling about heart failure. Directed toward relieving symptoms such as shortness of breath, decreased exercise tolerance, and extremity edema.    Expected Outcomes Improve functional capacity of life;Short term: Attendance in program 2-3 days a week with increased exercise capacity. Reported lower sodium intake. Reported increased fruit and vegetable intake. Reports medication compliance.;Short term: Daily weights obtained and reported for increase. Utilizing diuretic protocols set by physician.;Long term: Adoption of self-care skills and reduction of barriers for early signs and symptoms recognition and intervention leading to self-care maintenance.    Hypertension Yes  Intervention Provide education on lifestyle modifcations including regular physical  activity/exercise, weight management, moderate sodium restriction and increased consumption of fresh fruit, vegetables, and low fat dairy, alcohol moderation, and smoking cessation.;Monitor prescription use compliance.    Expected Outcomes Short Term: Continued assessment and intervention until BP is < 140/25mm HG in hypertensive participants. < 130/24mm HG in hypertensive participants with diabetes, heart failure or chronic kidney disease.;Long Term: Maintenance of blood pressure at goal levels.    Lipids Yes    Intervention Provide education and support for participant on nutrition & aerobic/resistive exercise along with prescribed medications to achieve LDL '70mg'$ , HDL >$Remo'40mg'JgOMh$ .    Expected Outcomes Short Term: Participant states understanding of desired cholesterol values and is compliant with medications prescribed. Participant is following exercise prescription and nutrition guidelines.;Long Term: Cholesterol controlled with medications as prescribed, with individualized exercise RX and with personalized nutrition plan. Value goals: LDL < $Rem'70mg'xtrd$ , HDL > 40 mg.             Education:Diabetes - Individual verbal and written instruction to review signs/symptoms of diabetes, desired ranges of glucose level fasting, after meals and with exercise. Acknowledge that pre and post exercise glucose checks will be done for 3 sessions at entry of program. [place] from 04/16/2022 in [facility]  Date 04/16/22  Educator [facility]  Instruction Review Code 1- Verbalizes Understanding       Core Components/Risk Factors/Patient Goals Review:   Goals and Risk Factor Review     Row Name 05/14/22 1124 05/25/22 1122 07/11/22 1116 07/20/22 1146       Core Components/Risk Factors/Patient Goals Review   Personal Goals Review Weight Management/Obesity;Hypertension Weight Management/Obesity Weight Management/Obesity;Diabetes;Heart Failure;Hypertension Weight  Management/Obesity;Diabetes;Heart Failure;Hypertension    Review Kaspian is able to check his blood pressure at home and has been good in the program. He checks his blood pressure at home routinely. He would like to lose more weight and gain more strength. Rashad has lost some more weight and wants to reach a weight goal of 240 pounds. He needs to be 260 pounds to get a new heart. He is determined to get his weight off. He has lost a few pounds since he last visited. Durell reports his BG has been stable around 140 in the morning. His Ozempic medication has been increased and he reports doing well with it. He reports his weight fluctuating up and down; he has made diet changes such as cooking more at home and limiting chinese food - he is not exercising outside of rehab right now. He continues to take his medications as prescribed with no issues. He notices that when he eats salty food, but he has been staying on top of his medications and weight at home. He checks his BP at home and doesn't feel it is accurate, encouraged him to bring it in so we can check it. Today his BP was 126/66. Alexandra was on a heart monitor for two weeks and just sent it in to his doctor today - waiting on results. Cardale is doing well in rehab.  He is watching his weight and working out on off days.  His pressures have been good.  His monitor went well and his heart rate is doing well.  He is feeling better overall.  He continues to keep close eye on heart failure symptoms.    Expected Outcomes Short: lose 5 pounds in a couple weeks. Long: reach weight goal. Short: lose more weight. Long: reach weight  goal. Short: continue to monitor risk factors and work on behavior changes (diet and exercise) Long: reach weight goal. COntineu to manage heart failure symptoms             Core Components/Risk Factors/Patient Goals at Discharge (Final Review):   Goals and Risk Factor Review - 07/20/22 1146       Core Components/Risk Factors/Patient  Goals Review   Personal Goals Review Weight Management/Obesity;Diabetes;Heart Failure;Hypertension    Review Brayam is doing well in rehab.  He is watching his weight and working out on off days.  His pressures have been good.  His monitor went well and his heart rate is doing well.  He is feeling better overall.  He continues to keep close eye on heart failure symptoms.    Expected Outcomes COntineu to manage heart failure symptoms             ITP Comments:  ITP Comments     Row Name 04/16/22 1420 04/19/22 1343 04/23/22 1113 05/16/22 0836 06/13/22 1233   ITP Comments Initial telephone orientation completed. Diagnosis can be found in [facility] 3/30. EP orientation scheduled for Thursday 4/30 at 9:30. Completed 6MWT and gym orientation. Initial ITP created and sent for review to Dr. Emily Filbert, Medical Director. First full day of exercise!  Patient was oriented to gym and equipment including functions, settings, policies, and procedures.  Patient's individual exercise prescription and treatment plan were reviewed.  All starting workloads were established based on the results of the 6 minute walk test done at initial orientation visit.  The plan for exercise progression was also introduced and progression will be customized based on patient's performance and goals. 30 Day review completed. Medical Director ITP review done, changes made as directed, and signed approval by Medical Director.    NEW 30 Day review completed. Medical Director ITP review done, changes made as directed, and signed approval by Medical Director.    Row Name 06/18/22 1206 06/27/22 1300 07/11/22 0808 08/01/22 1138     ITP Comments Completed initial RD consultation Patient called to let us know that his doctor gave him a Zio patch to wear for 2 weeks and was told to be out of rehab until it is removed. He is supposed to take it off on 7/11, we'll plan for patient to return to rehab on 7/12  with clearance or unless his doctors says  otherwise based on results. 30 Day review completed. Medical Director ITP review done, changes made as directed, and signed approval by Medical Director. Dell graduated today from  rehab with 36 sessions completed.  Details of the patient's exercise prescription and what He needs to do in order to continue the prescription and progress were discussed with patient.  Patient was given a copy of prescription and goals.  Patient verbalized understanding.  Marice plans to continue to exercise by walking and biking.             Comments: Discharge ITP

## 2022-08-07 MED ORDER — SEMAGLUTIDE (1 MG/DOSE) 4 MG/3ML ~~LOC~~ SOPN: 1.0000 mg | PEN_INJECTOR | SUBCUTANEOUS | 11 refills | Status: DC

## 2022-08-07 NOTE — Telephone Encounter (Signed)
Pt called stating they were not able to find pharmacy with ozempic 2mg . I advised I will send Rx for 1mg  into pharmacy until the 2mg  becomes available.

## 2022-08-09 ENCOUNTER — Telehealth (HOSPITAL_COMMUNITY): Payer: Self-pay

## 2022-08-09 NOTE — Telephone Encounter (Signed)
Received a fax requesting medical records from [facility](Julie Case). Records were successfully faxed to: [phone] ,which was the number provided.. Medical request form will be scanned into patients chart.

## 2022-08-10 ENCOUNTER — Ambulatory Visit (HOSPITAL_BASED_OUTPATIENT_CLINIC_OR_DEPARTMENT_OTHER): Payer: 59 | Attending: Family Medicine | Admitting: Cardiology

## 2022-08-10 DIAGNOSIS — R0683 Snoring: Secondary | ICD-10-CM | POA: Insufficient documentation

## 2022-08-10 DIAGNOSIS — I441 Atrioventricular block, second degree: Secondary | ICD-10-CM

## 2022-08-10 DIAGNOSIS — I493 Ventricular premature depolarization: Secondary | ICD-10-CM | POA: Diagnosis not present

## 2022-08-12 NOTE — Procedures (Signed)
   Patient Name: Robert Mccann, Robert Mccann Date:08/10/2022 Gender: Male D.O.B: 01/06/1996 Age (years): 25 Referring Provider: Prince Rome FNP Height (inches): 71 Interpreting Physician: Armanda Magic MD, ABSM Weight (lbs): 320 RPSGT: Cherylann Parr BMI: 45 MRN: [id number] Neck Size: 17.00  CLINICAL INFORMATION Sleep Study Type: NPSG  Indication for sleep study: Obesity, Snoring, Witnesses Apnea / Gasping During Sleep  SLEEP STUDY TECHNIQUE As per the AASM Manual for the Scoring of Sleep and Associated Events v2.3 (April 2016) with a hypopnea requiring 4% desaturations.  The channels recorded and monitored were frontal, central and occipital EEG, electrooculogram (EOG), submentalis EMG (chin), nasal and oral airflow, thoracic and abdominal wall motion, anterior tibialis EMG, snore microphone, electrocardiogram, and pulse oximetry.  MEDICATIONS Medications self-administered by patient taken the night of the study : N/A  SLEEP ARCHITECTURE The study was initiated at 10:19:32 PM and ended at 4:43:38 AM.  Sleep onset time was 2.0 minutes and the sleep efficiency was 77.5%. The total sleep time was 297.6 minutes.  Stage REM latency was 218.0 minutes.  The patient spent 9.1% of the night in stage N1 sleep, 76.0% in stage N2 sleep, 0.8% in stage N3 and 14.1% in REM.  Alpha intrusion was absent.  Supine sleep was 91.39%.  RESPIRATORY PARAMETERS The overall apnea/hypopnea index (AHI) was 3.4 per hour. There were 1 total apneas, including 1 obstructive, 0 central and 0 mixed apneas. There were 16 hypopneas and 0 RERAs.  The AHI during Stage REM sleep was 18.6 per hour.  AHI while supine was 3.8 per hour.  The mean oxygen saturation was 94.4%. The minimum SpO2 during sleep was 88.0%.  moderate snoring was noted during this study.  CARDIAC DATA The 2 lead EKG demonstrated sinus rhythm. The mean heart rate was 71.0 beats per minute. Other EKG findings include: PVCs.  LEG  MOVEMENT DATA The total PLMS were 0 with a resulting PLMS index of 0.0. Associated arousal with leg movement index was 0.0 .  IMPRESSIONS - No significant obstructive sleep apnea occurred during this study (AHI = 3.4/h). - The patient had minimal or no oxygen desaturation during the study (Min O2 = 88.0%) - The patient snored with moderate snoring volume. - EKG findings include PVCs. - Clinically significant periodic limb movements did not occur during sleep. No significant associated arousals.  DIAGNOSIS - Normal Study  RECOMMENDATIONS - Avoid alcohol, sedatives and other CNS depressants that may worsen sleep apnea and disrupt normal sleep architecture. - Sleep hygiene should be reviewed to assess factors that may improve sleep quality. - Weight management and regular exercise should be initiated or continued if appropriate.  [Electronically signed] 08/12/2022 10:30 PM  Armanda Magic MD, ABSM Diplomate, American Board of Sleep Medicine

## 2022-08-13 ENCOUNTER — Telehealth: Payer: Self-pay | Admitting: Pharmacist

## 2022-08-13 MED ORDER — OZEMPIC (2 MG/DOSE) 8 MG/3ML ~~LOC~~ SOPN: 2.0000 mg | PEN_INJECTOR | SUBCUTANEOUS | 11 refills | Status: DC

## 2022-08-13 NOTE — Telephone Encounter (Signed)
Pt and his girlfriend called clinic, stated that nowhere can get Ozempic 1mg  or 2mg  in stock. They tried calling [facility] in [place], [facility], [place], and [facility]. Advised there has been a [place] and we cannot tell what pharmacies can get which GLPs in stock or at what strength.  Pt called back about 5 minutes later stating [facility] in [place] can get the 2mg  dose in stock, refill sent in.

## 2022-08-14 ENCOUNTER — Other Ambulatory Visit (HOSPITAL_COMMUNITY): Payer: Self-pay | Admitting: *Deleted

## 2022-08-14 MED ORDER — METOPROLOL TARTRATE 100 MG PO TABS: ORAL_TABLET | ORAL | 0 refills | Status: DC

## 2022-08-14 MED ORDER — IVABRADINE HCL 7.5 MG PO TABS: ORAL_TABLET | ORAL | 0 refills | Status: DC

## 2022-08-16 ENCOUNTER — Ambulatory Visit: Admission: RE | Admit: 2022-08-16 | Payer: 59 | Source: Ambulatory Visit

## 2022-08-17 ENCOUNTER — Ambulatory Visit (INDEPENDENT_AMBULATORY_CARE_PROVIDER_SITE_OTHER): Payer: 59

## 2022-08-17 ENCOUNTER — Telehealth (HOSPITAL_COMMUNITY): Payer: Self-pay | Admitting: *Deleted

## 2022-08-17 DIAGNOSIS — I5022 Chronic systolic (congestive) heart failure: Secondary | ICD-10-CM | POA: Diagnosis not present

## 2022-08-17 LAB — ECHOCARDIOGRAM COMPLETE
AR max vel: 3.53 cm2
AV Area VTI: 2.7 cm2
AV Area mean vel: 3.06 cm2
AV Mean grad: 3 mmHg
AV Peak grad: 4.2 mmHg
Ao pk vel: 1.03 m/s
Area-P 1/2: 3.5 cm2
Calc EF: 31.9 %
S' Lateral: 5.8 cm
Single Plane A2C EF: 37.9 %
Single Plane A4C EF: 26.6 %

## 2022-08-17 MED ORDER — PERFLUTREN LIPID MICROSPHERE
1.0000 mL | INTRAVENOUS | Status: AC | PRN
  Administered 2022-08-17: 3 mL via INTRAVENOUS

## 2022-08-17 NOTE — Telephone Encounter (Signed)
Reaching out to patient to offer assistance regarding upcoming cardiac imaging study; pt verbalizes understanding of appt date/time, parking situation and where to check in, pre-test NPO status and medications ordered, and verified current allergies; name and call back number provided for further questions should they arise  Larey Brick RN Navigator Cardiac Imaging [facility] [phone] office [phone] cell  Patient to take 100mg  metoprolol tartate and 15mg  ivabradine two hours prior to his cardiac CT scan.

## 2022-08-20 ENCOUNTER — Ambulatory Visit
Admission: RE | Admit: 2022-08-20 | Discharge: 2022-08-20 | Disposition: A | Discharge status: Home / Self Care | Payer: 59 | Source: Ambulatory Visit | Attending: Internal Medicine | Admitting: Internal Medicine

## 2022-08-20 ENCOUNTER — Encounter: Payer: Self-pay | Admitting: Cardiology

## 2022-08-20 ENCOUNTER — Ambulatory Visit (INDEPENDENT_AMBULATORY_CARE_PROVIDER_SITE_OTHER): Payer: 59 | Admitting: Cardiology

## 2022-08-20 VITALS — BP 100/80 | HR 78 | Ht 70.0 in | Wt 330.0 lb

## 2022-08-20 DIAGNOSIS — I513 Intracardiac thrombosis, not elsewhere classified: Secondary | ICD-10-CM | POA: Diagnosis not present

## 2022-08-20 DIAGNOSIS — I428 Other cardiomyopathies: Secondary | ICD-10-CM

## 2022-08-20 DIAGNOSIS — I10 Essential (primary) hypertension: Secondary | ICD-10-CM

## 2022-08-20 DIAGNOSIS — I429 Cardiomyopathy, unspecified: Secondary | ICD-10-CM

## 2022-08-20 LAB — POCT I-STAT CREATININE: Creatinine, Ser: 1 mg/dL (ref 0.61–1.24)

## 2022-08-20 MED ORDER — IOHEXOL 350 MG/ML SOLN
100.0000 mL | Freq: Once | INTRAVENOUS | Status: AC | PRN
  Administered 2022-08-20: 100 mL via INTRAVENOUS

## 2022-08-20 MED ORDER — NITROGLYCERIN 0.4 MG SL SUBL
SUBLINGUAL_TABLET | SUBLINGUAL | Status: AC
  Administered 2022-08-20: 0.8 mg via SUBLINGUAL
  Filled 2022-08-20: qty 2

## 2022-08-20 MED ORDER — METOLAZONE 2.5 MG PO TABS: 2.5000 mg | ORAL_TABLET | Freq: Every day | ORAL | 5 refills | Status: DC

## 2022-08-20 MED ORDER — METOPROLOL TARTRATE 5 MG/5ML IV SOLN
INTRAVENOUS | Status: AC
  Administered 2022-08-20: 10 mg via INTRAVENOUS
  Filled 2022-08-20: qty 10

## 2022-08-20 MED ORDER — NITROGLYCERIN 0.4 MG SL SUBL
0.8000 mg | SUBLINGUAL_TABLET | SUBLINGUAL | Status: DC | PRN
  Filled 2022-08-20: qty 25

## 2022-08-20 MED ORDER — METOPROLOL TARTRATE 5 MG/5ML IV SOLN
10.0000 mg | Freq: Once | INTRAVENOUS | Status: AC
Start: 2022-08-20 — End: 2022-08-20
  Filled 2022-08-20: qty 10

## 2022-08-20 MED ORDER — NITROGLYCERIN 0.4 MG SL SUBL
0.4000 mg | SUBLINGUAL_TABLET | SUBLINGUAL | Status: DC | PRN
  Filled 2022-08-20: qty 25

## 2022-08-20 NOTE — Patient Instructions (Addendum)
Medication Instructions:   Your physician has recommended you make the following change in your medication:    START taking Metolazone 2.5 MG once a day.  (Take this 30 minutes prior to your Furosemide).   *If you need a refill on your cardiac medications before your next appointment, please call your pharmacy*    Lab Orders:  Your physician recommends that you return for lab work (BMP) in: 1 WEEK.   - Please go to the [facility]. You will check in at the front desk to the right as you walk into the atrium. Valet Parking is offered if needed. - No appointment needed. You may go any day between 7 am and 6 pm.     Testing/Procedures:  Your physician has requested that you have an echocardiogram in 3 months. Echocardiography is a painless test that uses sound waves to create images of your heart. It provides your doctor with information about the size and shape of your heart and how well your heart's chambers and valves are working. This procedure takes approximately one hour. There are no restrictions for this procedure.    Follow-Up: At [facility], you and your health needs are our priority.  As part of our continuing mission to provide you with exceptional heart care, we have created designated Provider Care Teams.  These Care Teams include your primary Cardiologist (physician) and Advanced Practice Providers (APPs -  Physician Assistants and Nurse Practitioners) who all work together to provide you with the care you need, when you need it.  We recommend signing up for the patient portal called "MyChart".  Sign up information is provided on this After Visit Summary.  MyChart is used to connect with patients for Virtual Visits (Telemedicine).  Patients are able to view lab/test results, encounter notes, upcoming appointments, etc.  Non-urgent messages can be sent to your provider as well.   To learn more about what you can do with MyChart, go to [email or web address].     Your next appointment:   6 month(s)  The format for your next appointment:   In Person  Provider:    Debbe Odea, MD  Or Ward Givens   Important Information About Sugar

## 2022-08-20 NOTE — Progress Notes (Signed)
Patient tolerated procedure well. Ambulate w/o difficulty. Denies any lightheadedness or being dizzy. Pt denies any pain at this time. Sitting in chair. Pt is encouraged to drink additional water throughout the day and reason explained to patient. Patient verbalized understanding and all questions answered. ABC intact. No further needs at this time. Discharge from procedure area w/o issues. 

## 2022-08-20 NOTE — Progress Notes (Signed)
Cardiology Office Note:    Date:  08/20/2022   ID:  Robert Mccann, DOB 01-08-1996, MRN [id number]  PCP:  [facility] Providers Cardiologist:  Kate Sable, MD     Referring MD: Ellene Route   Chief Complaint  Patient presents with   Follow-up   Headache    History of Present Illness:    Robert Mccann is a 26 y.o. 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  He had a coronary CTA today showing no CAD, has a family history of cardiomyopathy on mother side, mother has an ICD, uncles and aunts on mother side also with heart failure.  Was previously on beta-blockers, developed significant bradycardia, beta-blocker was stopped.  He states eating low-salt diet, compliant with all his medications, seem to have been gaining weight.   Prior notes CMR 02/2022 EF 11%, LV thrombus Coronary CTA 08/20/2022, no CAD Echo 05/2021 EF less than 20%, evidence of noncompaction. Mother has a history of ICD placement. Carvedilol stopped due to significant bradycardia  Past Medical History:  Diagnosis Date   AV block, Mobitz 2    a. 05/2021 noted during periods of sleep @ time of hospitalization for PE.   Cardiomyopathy ([place])    a. 05/2021 Echo: EF <20%, glob HK. :C ratio of 2.1:1 suggesting non-compaction. Gr2 DD. Sev reduced RV fxn. Nl PASP. Mildly dil RA. Mild MR; b. 10/2021 Echo: EF <20%, GrII DD; c. 10/2021 TEE: EF 20-25%, glob HK, sev red RV fxn.   Diabetes mellitus without complication ([place])    HFrEF (heart failure with reduced ejection fraction) ([place])    a. 05/2021 Echo: EF <20%. ? non-compaction.   Hypertension    Pulmonary embolism ([place])    a. 05/2021 CTA Chest: PE extending from lobar arteris of RUL and RLL w/ concern for pulml infarct-->s/p thrombolysis and thrombectomy of RUL/RML/RLL.    Stroke [facility])    a. 10/2021 CT Head: Bilat cerebral infarcts w/ mod-sided acute to subacute R temporoparietal infarct; b. 10/2021 MRI: R post PCA territory infarcts w/o hemorrhage; c. 10/2021 TEE: Neg bubble study. No LA/LAA thrombus. EF 20-25%, glob HK.    Past Surgical History:  Procedure Laterality Date   COLONOSCOPY N/A 05/18/2022   Procedure: COLONOSCOPY;  Surgeon: Sharyn Creamer, MD;  Location: [place];  Service: Gastroenterology;  Laterality: N/A;   HAND SURGERY Left age 36   HAND SURGERY     HEMOSTASIS CLIP PLACEMENT  05/18/2022   Procedure: HEMOSTASIS CLIP PLACEMENT;  Surgeon: Sharyn Creamer, MD;  Location: [place];  Service: Gastroenterology;;   POLYPECTOMY  05/18/2022   Procedure: POLYPECTOMY;  Surgeon: Sharyn Creamer, MD;  Location: [place];  Service: Gastroenterology;;   PULMONARY THROMBECTOMY N/A 06/28/2021   Procedure: PULMONARY THROMBECTOMY;  Surgeon: Algernon Huxley, MD;  Location: [facility] CV LAB;  Service: Cardiovascular;  Laterality: N/A;   TEE WITHOUT CARDIOVERSION N/A 11/08/2021   Procedure: TRANSESOPHAGEAL ECHOCARDIOGRAM (TEE);  Surgeon: Kate Sable, MD;  Location: [facility];  Service: Cardiovascular;  Laterality: N/A;    Current Medications: Current Meds  Medication Sig   apixaban (ELIQUIS) 5 MG TABS tablet Take 1 tablet (5 mg total) by mouth 2 (two) times daily.   Bempedoic Acid-Ezetimibe (NEXLIZET) 180-10 MG TABS Take 1 tablet by mouth daily.   Blood Glucose Monitoring Suppl (BLOOD GLUCOSE MONITOR SYSTEM) w/Device KIT Use up to four  times daily as directed.   digoxin (LANOXIN) 0.125 MG tablet Take 1 tablet (0.125 mg total) by mouth daily.   FARXIGA 10 MG TABS tablet Take 10 mg by mouth daily.   furosemide (LASIX) 20 MG tablet Take 4 tablets (80 mg total) by mouth daily.   ivabradine (CORLANOR) 7.5 MG TABS tablet Take tablets (39m) TWO hours prior to your cardiac CT scan.   metFORMIN (GLUCOPHAGE) 500 MG tablet Take 1 tablet (500 mg total) by  mouth 2 (two) times daily with a meal.   metolazone (ZAROXOLYN) 2.5 MG tablet Take 1 tablet (2.5 mg total) by mouth daily.   metoprolol tartrate (LOPRESSOR) 100 MG tablet Take tablet (1028m TWO hours prior to your cardiac CT scan.   potassium chloride (KLOR-CON) 20 MEQ packet Take 60 mEq by mouth daily.   rosuvastatin (CRESTOR) 40 MG tablet Take 1 tablet (40 mg total) by mouth daily.   sacubitril-valsartan (ENTRESTO) 97-103 MG Take 1 tablet by mouth 2 (two) times daily.   Semaglutide, 2 MG/DOSE, (OZEMPIC, 2 MG/DOSE,) 8 MG/3ML SOPN Inject 2 mg into the skin once a week.   spironolactone (ALDACTONE) 25 MG tablet Take 1 tablet (25 mg total) by mouth daily.     Allergies:   Patient has no known allergies.   Social History   Socioeconomic History   Marital status: Single    Spouse name: Not on file   Number of children: Not on file   Years of education: Not on file   Highest education level: Not on file  Occupational History   Not on file  Tobacco Use   Smoking status: Never   Smokeless tobacco: Never  Vaping Use   Vaping Use: Never used  Substance and Sexual Activity   Alcohol use: Not Currently    Comment: special occasions   Drug use: Yes    Types: Marijuana    Comment: last month   Sexual activity: Yes    Partners: Female  Other Topics Concern   Not on file  Social History Narrative   Smokes weed; No smoking cigarettes; no alcohol. 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    Social Determinants of Health   Financial Resource Strain: Not on file  Food Insecurity: Not on file  Transportation Needs: Not on file  Physical Activity: Not on file  Stress: Not on file  Social Connections: Not on file     Family History: The patient's family history includes Cancer in his cousin and cousin; Cerebrovascular Accident in his mother; Diabetes in his father; Heart failure in his mother; Hypertension in his father.  ROS:   Please see the  history of present illness.     All other systems reviewed and are negative.  EKGs/Labs/Other Studies Reviewed:    The following studies were reviewed today:   EKG:  EKG not ordered today.    Recent Labs: 03/17/2022: TSH 5.035 03/21/2022: Magnesium 2.2 05/30/2022: Hemoglobin 16.6; Platelets 333 06/12/2022: ALT 38 06/26/2022: B Natriuretic Peptide 73.5; BUN 15; Potassium 4.2; Sodium 135 08/20/2022: Creatinine, Ser 1.00  Recent Lipid Panel    Component Value Date/Time   CHOL 98 (L) 06/12/2022 1140   TRIG 116 06/12/2022 1140   HDL 38 (L) 06/12/2022 1140   CHOLHDL 2.6 06/12/2022 1140   CHOLHDL 4.5 11/06/2021 0417   VLDL 29 11/06/2021 0417   LDLCALC 39 06/12/2022 1140     Risk Assessment/Calculations:          Physical Exam:  VS:  BP 100/80 (BP Location: Left Arm, Patient Position: Sitting, Cuff Size: Normal)   Pulse 78   Ht _0  (1.778 m)   Wt (!) 330 lb (149.7 kg)   BMI 47.35 kg/m     Wt Readings from Last 3 Encounters:  08/20/22 (!) 330 lb (149.7 kg)  08/10/22 (!) 326 lb (147.9 kg)  07/31/22 (!) 327 lb 9.6 oz (148.6 kg)     GEN:  Well nourished, well developed in no acute distress HEENT: Normal NECK: No JVD; No carotid bruits CARDIAC: RRR, no murmurs, rubs, gallops RESPIRATORY: Diminished breath sounds at bases ABDOMEN: Soft, non-tender, distended MUSCULOSKELETAL:  No edema; No deformity  SKIN: Warm and dry NEUROLOGIC:  Alert and oriented x 3 PSYCHIATRIC:  Normal affect   ASSESSMENT:    1. NICM (nonischemic cardiomyopathy) ([place])   2. Primary hypertension   3. LV (left ventricular) mural thrombus    PLAN:    In order of problems listed above:  Nonischemic cardiomyopathy, last echo with EF 25 to 30%.  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  Describes NYHA class II-III symptoms.  Weight gain.  Start metolazone 2.5 mg daily, continue Lasix 80 mg daily.  Check BMP in 1 week.  Continue Entresto 97-103 mg twice daily, Aldactone 25 mg daily, Farxiga.  Not on  beta-blocker due to previous Mobitz 2.  Refer to EP for ICD consideration. Hypertension, BP controlled. Entresto, Aldactone as above. LV thrombus, resolved on last echo, continue Eliquis due to low EF, history of PE.  Follow-up in 6 months   Medication Adjustments/Labs and Tests Ordered: Current medicines are reviewed at length with the patient today.  Concerns regarding medicines are outlined above.  Orders Placed This Encounter  Procedures   Basic metabolic panel   Ambulatory referral to Cardiac Electrophysiology   ECHOCARDIOGRAM COMPLETE    Meds ordered this encounter  Medications   metolazone (ZAROXOLYN) 2.5 MG tablet    Sig: Take 1 tablet (2.5 mg total) by mouth daily.    Dispense:  30 tablet    Refill:  5     Patient Instructions  Medication Instructions:   Your physician has recommended you make the following change in your medication:    START taking Metolazone 2.5 MG once a day.  (Take this 30 minutes prior to your Furosemide).   *If you need a refill on your cardiac medications before your next appointment, please call your pharmacy*    Lab Orders:  Your physician recommends that you return for lab work (BMP) in: [place].   - Please go to the [facility]. You will check in at the front desk to the right as you walk into the atrium. Valet Parking is offered if needed. - No appointment needed. You may go any day between 7 am and 6 pm.     Testing/Procedures:  Your physician has requested that you have an echocardiogram in 3 months. Echocardiography is a painless test that uses sound waves to create images of your heart. It provides your doctor with information about the size and shape of your heart and how well your heart's chambers and valves are working. This procedure takes approximately one hour. There are no restrictions for this procedure.    Follow-Up: At [facility], you and your health needs are our priority.  As part of our continuing  mission to provide you with exceptional heart care, we have created designated Provider Care Teams.  These Care Teams include your primary Cardiologist (physician) and Advanced  Practice Providers (APPs -  Physician Assistants and Nurse Practitioners) who all work together to provide you with the care you need, when you need it.  We recommend signing up for the patient portal called "MyChart".  Sign up information is provided on this After Visit Summary.  MyChart is used to connect with patients for Virtual Visits (Telemedicine).  Patients are able to view lab/test results, encounter notes, upcoming appointments, etc.  Non-urgent messages can be sent to your provider as well.   To learn more about what you can do with MyChart, go to [email or web address].    Your next appointment:   6 month(s)  The format for your next appointment:   In Person  Provider:    Kate Sable, MD  Or Ignacia Bayley   Important Information About Sugar         Signed, Kate Sable, MD  08/20/2022 3:24 PM    [place]

## 2022-08-28 ENCOUNTER — Telehealth: Payer: Self-pay | Admitting: *Deleted

## 2022-08-28 NOTE — Telephone Encounter (Unsigned)
The patient has been notified of the result and verbalized understanding.  All questions (if any) were answered. Latrelle Dodrill, CMA 08/28/2022 6:09 PM

## 2022-08-28 NOTE — Telephone Encounter (Signed)
-----   Message from Gaynelle Cage, [place] sent at 08/13/2022  7:57 AM EDT -----  ----- Message ----- From: Quintella Reichert, MD Sent: 08/12/2022  10:32 PM EDT To: Cv Div Sleep Studies  Please let patient know that sleep study showed no significant sleep apnea.

## 2022-08-29 ENCOUNTER — Ambulatory Visit: Payer: 59 | Admitting: Family

## 2022-09-12 ENCOUNTER — Telehealth: Payer: Self-pay | Admitting: Pharmacist

## 2022-09-12 NOTE — Telephone Encounter (Signed)
Ozempic PA received. Submitted via CMM Key: [id number]

## 2022-09-13 ENCOUNTER — Other Ambulatory Visit (HOSPITAL_COMMUNITY): Payer: Self-pay

## 2022-09-13 MED ORDER — NEXLIZET 180-10 MG PO TABS: 1.0000 | ORAL_TABLET | Freq: Every day | ORAL | 3 refills | Status: DC

## 2022-09-14 NOTE — Telephone Encounter (Signed)
PA for Ozempic approved. PA for Nexlizet also submitted  [id number]

## 2022-09-20 ENCOUNTER — Telehealth (HOSPITAL_COMMUNITY): Payer: Self-pay

## 2022-09-20 NOTE — Telephone Encounter (Signed)
Patient needed letter stating he can not work for at least one year due to his Cardiac Conditions. Copied previous letter from 03/2022. Letter signed by Dr. Haroldine Laws and left up front for patient pick up.

## 2022-09-28 ENCOUNTER — Other Ambulatory Visit (HOSPITAL_COMMUNITY): Payer: Self-pay

## 2022-09-28 ENCOUNTER — Ambulatory Visit (HOSPITAL_COMMUNITY)
Admission: RE | Admit: 2022-09-28 | Discharge: 2022-09-28 | Disposition: A | Discharge status: Home / Self Care | Payer: BLUE CROSS/BLUE SHIELD | Source: Ambulatory Visit | Attending: Internal Medicine | Admitting: Internal Medicine

## 2022-09-28 ENCOUNTER — Encounter (HOSPITAL_COMMUNITY): Payer: Self-pay | Admitting: Internal Medicine

## 2022-09-28 VITALS — BP 120/90 | HR 83 | Wt 341.4 lb

## 2022-09-28 DIAGNOSIS — Z7984 Long term (current) use of oral hypoglycemic drugs: Secondary | ICD-10-CM | POA: Insufficient documentation

## 2022-09-28 DIAGNOSIS — I110 Hypertensive heart disease with heart failure: Secondary | ICD-10-CM | POA: Insufficient documentation

## 2022-09-28 DIAGNOSIS — I5082 Biventricular heart failure: Secondary | ICD-10-CM | POA: Insufficient documentation

## 2022-09-28 DIAGNOSIS — I2699 Other pulmonary embolism without acute cor pulmonale: Secondary | ICD-10-CM

## 2022-09-28 DIAGNOSIS — E119 Type 2 diabetes mellitus without complications: Secondary | ICD-10-CM | POA: Diagnosis not present

## 2022-09-28 DIAGNOSIS — R5383 Other fatigue: Secondary | ICD-10-CM | POA: Insufficient documentation

## 2022-09-28 DIAGNOSIS — D751 Secondary polycythemia: Secondary | ICD-10-CM | POA: Diagnosis not present

## 2022-09-28 DIAGNOSIS — E785 Hyperlipidemia, unspecified: Secondary | ICD-10-CM | POA: Insufficient documentation

## 2022-09-28 DIAGNOSIS — Z79899 Other long term (current) drug therapy: Secondary | ICD-10-CM | POA: Diagnosis not present

## 2022-09-28 DIAGNOSIS — Z86711 Personal history of pulmonary embolism: Secondary | ICD-10-CM | POA: Diagnosis not present

## 2022-09-28 DIAGNOSIS — I081 Rheumatic disorders of both mitral and tricuspid valves: Secondary | ICD-10-CM | POA: Insufficient documentation

## 2022-09-28 DIAGNOSIS — I441 Atrioventricular block, second degree: Secondary | ICD-10-CM | POA: Diagnosis not present

## 2022-09-28 DIAGNOSIS — Z7901 Long term (current) use of anticoagulants: Secondary | ICD-10-CM | POA: Insufficient documentation

## 2022-09-28 DIAGNOSIS — I3139 Other pericardial effusion (noninflammatory): Secondary | ICD-10-CM | POA: Diagnosis not present

## 2022-09-28 DIAGNOSIS — Z8673 Personal history of transient ischemic attack (TIA), and cerebral infarction without residual deficits: Secondary | ICD-10-CM | POA: Diagnosis not present

## 2022-09-28 DIAGNOSIS — I5022 Chronic systolic (congestive) heart failure: Secondary | ICD-10-CM | POA: Diagnosis not present

## 2022-09-28 DIAGNOSIS — Z6841 Body Mass Index (BMI) 40.0 and over, adult: Secondary | ICD-10-CM | POA: Insufficient documentation

## 2022-09-28 DIAGNOSIS — I513 Intracardiac thrombosis, not elsewhere classified: Secondary | ICD-10-CM

## 2022-09-28 LAB — BASIC METABOLIC PANEL
Anion gap: 9 (ref 5–15)
BUN: 10 mg/dL (ref 6–20)
CO2: 26 mmol/L (ref 22–32)
Calcium: 9.2 mg/dL (ref 8.9–10.3)
Chloride: 103 mmol/L (ref 98–111)
Creatinine, Ser: 1.02 mg/dL (ref 0.61–1.24)
GFR, Estimated: >60 mL/min (ref >60)
Glucose, Bld: 137 mg/dL — ABNORMAL HIGH (ref 70–99)
Potassium: 3.8 mmol/L (ref 3.5–5.1)
Sodium: 138 mmol/L (ref 135–145)

## 2022-09-28 LAB — DIGOXIN LEVEL: Digoxin Level: <0.2 ng/mL — ABNORMAL LOW (ref 0.8–2.0)

## 2022-09-28 LAB — BRAIN NATRIURETIC PEPTIDE: B Natriuretic Peptide: 32.1 pg/mL (ref 0.0–100.0)

## 2022-09-28 MED ORDER — ENTRESTO 97-103 MG PO TABS: 1.0000 | ORAL_TABLET | Freq: Two times a day (BID) | ORAL | 3 refills | Status: DC

## 2022-09-28 MED ORDER — METOPROLOL SUCCINATE ER 25 MG PO TB24: 25.0000 mg | ORAL_TABLET | Freq: Every day | ORAL | 3 refills | Status: DC

## 2022-09-28 NOTE — Patient Instructions (Signed)
Start Toprol XL 25 mg nightly.  Labs done today, your results will be available in MyChart, we will contact you for abnormal readings.  Your physician recommends that you schedule a follow-up appointment in: 2 months  If you have any questions or concerns before your next appointment please send [place] a message through [place] or call our office at [phone].    TO LEAVE A MESSAGE FOR THE NURSE SELECT OPTION 2, PLEASE LEAVE A MESSAGE INCLUDING: YOUR NAME DATE OF BIRTH CALL BACK NUMBER REASON FOR CALL**this is important as we prioritize the call backs  YOU WILL RECEIVE A CALL BACK THE SAME DAY AS LONG AS YOU CALL BEFORE 4:00 PM  At the [facility], you and your health needs are our priority. As part of our continuing mission to provide you with exceptional heart care, we have created designated Provider Care Teams. These Care Teams include your primary Cardiologist (physician) and Advanced Practice Providers (APPs- Physician Assistants and Nurse Practitioners) who all work together to provide you with the care you need, when you need it.   You may see any of the following providers on your designated Care Team at your next follow up: Dr Glori Bickers Dr Loralie Champagne Dr. Roxana Hires, NP Lyda Jester, [place] [facility] [place], [place] Forestine Na, NP Audry Riles, PharmD   Please be sure to bring in all your medications bottles to every appointment.

## 2022-09-28 NOTE — Progress Notes (Signed)
Advanced Heart Failure Clinic Note  Primary Care: Ellene Route General Cardiologist: Dr. Garen Lah HF Cardiologist: Dr. Haroldine Laws  HPI: Avner Stroder is a 26 y.o. 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  Admitted 6/22 to [facility] with PE. Underwent thrombectomy/thrombolysis of the right upper/middle/lower lobes. Dopplers negative for DVT though it was noted that the patient previously spent 12 hours each way to [place] and [place] and back about a week or 2 prior to admission. Echo showed EF of less than 20% with global hypokinesis and severely reduced RV function. 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 Noted to have intermittent Mobitz 2 heart block during periods of sleep with recommendation for outpatient sleep study. Started on GDMT and discharged home.    Admitted 10/2021 with dysarthria and left hand numbness/weakness. Found to have a large acute posterior right MCA territory infarct. TEE did not show any LA/LAA/LV thrombus, and bubble study was negative.  He reported compliance with Eliquis, which was held in the setting of areas of petechial hemorrhage on follow-up CT.  We discussed potentially switching to warfarin however, patient preferred to remain on Eliquis, and this was resumed once cleared by neurology.  Unable to complete coronary CTA due to inability to maintain adequate heart rate.   Admitted to [facility] 3/23 with CP and SOB. CT chest with small LUL PE, RA clot. Echo EF <20% severe RV dysfunction large LV clot. Transferred to [facility] for further management. No R/LHC with RA and LV clot. PICC placed, milrinone and lasix started. cMRI with LVEF 11%, RVEF 19%, large LV thrombus, criteria for LV non-compaction met, subendocardial LGE which can be seen in CAD and noncompaction, moderate pericardial effusion, moderate TR, mild MR.  Milrinone weaned and GDMT titrated, no beta blocker with wenckebach AV block. Not currently candidate for advanced therapies due to size and noncompliance. Discharged home, weight 309 lbs.  Follow up 4/23, Echo showed EF <20% moderate RV dysfunction large LV clot. Entresto increased and sleep study arranged.  Admitted 5/23 with rectal bleeding. Eliquis held. GI consulted, underwent colonoscopy showing significant bleeding from anal fissure. Had hemorrhoids, diverticulosis and 1 polyp that was resected and clipped. Eliquis resumed before discharge. CBC noted with polycythemia, hgb 19.6, he was referred to hematology at discharge. GDMT remained unchanged.  Follow up 06/21/22 with Dr. Garen Lah, patient self increased Lasix to 120 mg daily as he felt he was not urinating as much on 80 daily.  Today he returns for HF follow up with his wife and daughter and other family members. Says he feels OK but gets tired easily. No edema, orthopnea or PND. Says there are times that lasix doesn't work as well. No bleeding with eliquis. Has finished CR 2 months ago. Does a little walking but not doing too much. Weight up 15 pounds  Sleep study 8/23 AHI 3.4   Echo 8/23: EF 25-30% RV ok    Cardiac Studies: - Echo (4/23): EF < 20%, moderate RV dysfunction with large LV clot.  - cMRI (3/23): with LVEF 11%, RVEF 19%, large LV thrombus, criteria for LV non-compaction met, subendocardial LGE which can be seen in CAD and noncompaction, moderate pericardial effusion, moderate TR, mild MR.   - Echo (3/23): EF <20% severe RV dysfunction large LV clot.  - TEE (11/22): EF 20-25%, global HK LV, severe RV dysfunction, no clot, bubble study negative.  - Echo (6/22):  EF < 20% with global hypokinesis and severely reduced RV function.  ROS: All systems reviewed and negative except as per HPI.   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 05/2021 noted during periods of sleep @ time of hospitalization  for PE.   Cardiomyopathy ([place])    a. 05/2021 Echo: EF <20%, glob HK. [place]:C ratio of 2.1:1 suggesting non-compaction. Gr2 DD. Sev reduced RV fxn. Nl PASP. Mildly dil RA. Mild MR; b. 10/2021 Echo: EF <20%, GrII DD; c. 10/2021 TEE: EF 20-25%, glob HK, sev red RV fxn.   Diabetes mellitus without complication ([place])    HFrEF (heart failure with reduced ejection fraction) ([place])    a. 05/2021 Echo: EF <20%. ? non-compaction.   Hypertension    Pulmonary embolism ([place])    a. 05/2021 CTA Chest: PE extending from lobar arteris of RUL and RLL w/ concern for pulml infarct-->s/p thrombolysis and thrombectomy of RUL/RML/RLL.   Stroke [facility])    a. 10/2021 CT Head: Bilat cerebral infarcts w/ mod-sided acute to subacute R temporoparietal infarct; b. 10/2021 MRI: R post PCA territory infarcts w/o hemorrhage; c. 10/2021 TEE: Neg bubble study. No LA/LAA thrombus. EF 20-25%, glob HK.   Current Outpatient Medications  Medication Sig Dispense Refill   apixaban (ELIQUIS) 5 MG TABS tablet Take 1 tablet (5 mg total) by mouth 2 (two) times daily. 60 tablet 6   Bempedoic Acid-Ezetimibe (NEXLIZET) 180-10 MG TABS Take 1 tablet by mouth daily. 90 tablet 3   Blood Glucose Monitoring Suppl (BLOOD GLUCOSE MONITOR SYSTEM) w/Device KIT Use up to four times daily as directed. 1 kit 3   digoxin (LANOXIN) 0.125 MG tablet Take 1 tablet (0.125 mg total) by mouth daily. 30 tablet 6   FARXIGA 10 MG TABS tablet Take 10 mg by mouth daily.     furosemide (LASIX) 20 MG tablet Patient takes 120 mg(6) tablets by mouth daily.     metFORMIN (GLUCOPHAGE) 500 MG tablet Take 1 tablet (500 mg total) by mouth 2 (two) times daily with a meal. 60 tablet 0   potassium chloride (KLOR-CON) 20 MEQ packet Take 60 mEq by mouth daily. (Patient taking differently: Take 60 mEq by mouth daily. As needed) 90 packet 6   rosuvastatin (CRESTOR) 40 MG tablet Take 1 tablet (40 mg total) by mouth daily. 90 tablet 3   sacubitril-valsartan (ENTRESTO) 97-103 MG Take 1 tablet  by mouth 2 (two) times daily. 60 tablet 6   Semaglutide, 2 MG/DOSE, (OZEMPIC, 2 MG/DOSE,) 8 MG/3ML SOPN Inject 2 mg into the skin once a week. 3 mL 11   spironolactone (ALDACTONE) 25 MG tablet Take 1 tablet (25 mg total) by mouth daily. 30 tablet 6   No current facility-administered medications for this encounter.   No Known Allergies  Social History   Socioeconomic History   Marital status: Single    Spouse name: Not on file   Number of children: Not on file   Years of education: Not on file   Highest education level: Not on file  Occupational History   Not on file  Tobacco Use   Smoking status: Never   Smokeless tobacco: Never  Vaping Use   Vaping Use: Never used  Substance and Sexual Activity   Alcohol use: Not Currently    Comment: special occasions   Drug use: Yes    Types: Marijuana    Comment: last month   Sexual activity: Yes    Partners: Female  Other Topics Concern   Not on file  Social History Narrative   Smokes weed; No smoking cigarettes; no alcohol. 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 Lives in [place].  NO children.    Social Determinants of Health   Financial Resource Strain: Not on file  Food Insecurity: Not on file  Transportation Needs: Not on file  Physical Activity: Not on file  Stress: Not on file  Social Connections: Not on file  Intimate Partner Violence: Not on file   Family History  Problem Relation Age of Onset   Heart failure Mother    Cerebrovascular Accident Mother    Diabetes Father    Hypertension Father    Cancer Cousin        unknown origin   Cancer Cousin        unknown origing   BP (!) 120/90   Pulse 83   Wt (!) 154.9 kg (341 lb 6.4 oz)   SpO2 98%   BMI 48.99 kg/m   Wt Readings from Last 3 Encounters:  09/28/22 (!) 154.9 kg (341 lb 6.4 oz)  08/20/22 (!) 149.7 kg (330 lb)  08/10/22 (!) 147.9 kg (326 lb)   PHYSICAL EXAM: General:  Well appearing. No resp difficulty HEENT: normal Neck: supple. no JVD.  Carotids 2+ bilat; no bruits. No lymphadenopathy or thryomegaly appreciated. Cor: PMI nondisplaced. Regular rate & rhythm. No rubs, gallops or murmurs. Lungs: clear Abdomen: soft, nontender, nondistended. No hepatosplenomegaly. No bruits or masses. Good bowel sounds. Extremities: no cyanosis, clubbing, rash, edema Neuro: alert & orientedx3, cranial nerves grossly intact. moves all 4 extremities w/o difficulty. Affect pleasant  ASSESSMENT & PLAN: 1. 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  - Echo EF < 20% with large LV clot RV several decreased - cMRI (3/23): LVEF 11%, RVEF 19%, large LV thrombus, criteria for LV non-compaction met, subendocardial LGE which can be seen in CAD and noncompaction, moderate pericardial effusion, moderate TR, mild MR - Echo (4/23) EF <20% moderate RV dysfunction large LV clot. -  Echo 8/23: EF 25-30% RV ok  - Genetic testing with 2 genes of uncertain significant - NYHA II- early III, limited by fatigue. Volume looks ok, weight up a few lbs. - Increase Lasix back to 80 mg daily + extra 20 KCL. Suspect goal weight ~315 lbs. - Continue Entresto 97/103 mg bid. - Continue digoxin 0.125 mg daily. - Continue spironolactone 25 mg daily. - Continue Farxiga 10 mg daily. - Hold b-block for now with pauses and recent low output. May consider [place], but will defer today with recent HR in 40's. - Deferring cath for now with RA & LV clots. Pending coronary CTA (suspect nonischemic CMP). No scheduled, awaiting PA from insurance. Will follow up on this today. - Not candidate for advanced therapies currently with size, RV dysfunction.  - Continue CR. - Follow closely. If decompensates will need to consider VAD if RV can tolerate.  - Consider ICD. General cardiology has arranged follow up echo in 2 months. - BMET, BNP and digoxin level today.   2. Large LV thrombus/RA thrombus - RA thrombus noted on CTA chest. - Large LV thrombus on echo and cMRI. - Continue  Eliquis 5 mg bid. No bleeding issues. - Labs today.   3. Pericardial effusion - Moderate in size on cMRI. - Trivial on echo 4/23.   4. History of PE s/p thrombectomy and thrombolysis with recurrent new PEs most recent admission - Continue Eliquis as above.   5. History  of right posterior PCA territory infarcts (cardioembolic) - At high risk for recurrent events with large LV thrombus. - Continue Eliquis and statin.   6. Nocturnal Mobtiz 2 heart block - Noted during prior admission. - Arrange in lab sleep study. - Place Zio 14-day to quantify arrhythmia.  7. Erythrocytosis - Followed by Heme/Onc.  8. HLD - Continue statin + Nexlizet. - LDL 39 (6/23).   9. DM2 - A1c 8.9% 3/23. - Continue SGLT2i and GLP1RA.   10. Morbid obesity - Body mass index is 48.99 kg/m. - Continue GLP1RA. - Encouraged diet and exercise.   Glori Bickers, MD  2:58 PM

## 2022-10-09 NOTE — Progress Notes (Unsigned)
Electrophysiology Office Note:    Date:  10/10/2022   ID:  Robert Mccann, DOB April 12, 1996, MRN [id number]  PCP:  Ellene Route  [facility] Cardiologist:  Kate Sable, MD  [facility] Electrophysiologist:  Vickie Epley, MD   Referring MD: Kate Sable, MD   Chief Complaint: chronic systolic HF  History of Present Illness:    Robert Mccann is a 26 y.o. male who presents for an evaluation of chronic systolic HF at the request of Dr Garen Lah. Their medical history includes NICM w noncompaction, intermittent mobitz II, CVA, PE post thrombectomy, DM, HTN, obesity.  He has had genetic testing with 2 variants of unknown significance identified.  In June 2022 was admitted to [facility] with PE and received lytics/thrombectomy. EF <20% at that time. In November admitted with CVA.  Admitted 3/23 to [facility] EF < 20%. Large LV thrombus. Required milrinone/lasix. Has LV noncompaction.   Admitted 5/23 with rectal bleeding.   He is referred to discuss ICD therapy.  He is with his wife today in clinic.  He feels well.  He is taking his medications without any issues.  He confirms an extensive family history of early onset heart failure and cardiac death.  His mother has a defibrillator that was implanted in her 52s.    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 05/2021 noted during periods of sleep @ time of hospitalization for PE.   Cardiomyopathy ([place])    a. 05/2021 Echo: EF <20%, glob HK. [place]:C ratio of 2.1:1 suggesting non-compaction. Gr2 DD. Sev reduced RV fxn. Nl PASP. Mildly dil RA. Mild MR; b. 10/2021 Echo: EF <20%, GrII DD; c. 10/2021 TEE: EF 20-25%, glob HK, sev red RV fxn.   Diabetes mellitus without complication ([place])    HFrEF (heart failure with reduced ejection fraction) ([place])    a. 05/2021 Echo: EF <20%. ? non-compaction.   Hypertension    Pulmonary embolism ([place])    a. 05/2021 CTA Chest: PE extending from lobar arteris of RUL  and RLL w/ concern for pulml infarct-->s/p thrombolysis and thrombectomy of RUL/RML/RLL.   Stroke [facility])    a. 10/2021 CT Head: Bilat cerebral infarcts w/ mod-sided acute to subacute R temporoparietal infarct; b. 10/2021 MRI: R post PCA territory infarcts w/o hemorrhage; c. 10/2021 TEE: Neg bubble study. No LA/LAA thrombus. EF 20-25%, glob HK.    Past Surgical History:  Procedure Laterality Date   COLONOSCOPY N/A 05/18/2022   Procedure: COLONOSCOPY;  Surgeon: Sharyn Creamer, MD;  Location: [place];  Service: Gastroenterology;  Laterality: N/A;   HAND SURGERY Left age 27   HAND SURGERY     HEMOSTASIS CLIP PLACEMENT  05/18/2022   Procedure: HEMOSTASIS CLIP PLACEMENT;  Surgeon: Sharyn Creamer, MD;  Location: [place];  Service: Gastroenterology;;   POLYPECTOMY  05/18/2022   Procedure: POLYPECTOMY;  Surgeon: Sharyn Creamer, MD;  Location: [place];  Service: Gastroenterology;;   PULMONARY THROMBECTOMY N/A 06/28/2021   Procedure: PULMONARY THROMBECTOMY;  Surgeon: Algernon Huxley, MD;  Location: [place] [facility];  Service: Cardiovascular;  Laterality: N/A;   TEE WITHOUT CARDIOVERSION N/A 11/08/2021   Procedure: TRANSESOPHAGEAL ECHOCARDIOGRAM (TEE);  Surgeon: Kate Sable, MD;  Location: [facility];  Service: Cardiovascular;  Laterality: N/A;    Current Medications: Current Meds  Medication Sig   apixaban (ELIQUIS) 5 MG TABS tablet Take 1 tablet (5 mg total) by mouth 2 (two) times daily.   Bempedoic Acid-Ezetimibe (NEXLIZET) 180-10 MG  TABS Take 1 tablet by mouth daily.   Blood Glucose Monitoring Suppl (BLOOD GLUCOSE MONITOR SYSTEM) w/Device KIT Use up to four times daily as directed.   digoxin (LANOXIN) 0.125 MG tablet Take 1 tablet (0.125 mg total) by mouth daily.   FARXIGA 10 MG TABS tablet Take 10 mg by mouth daily.   furosemide (LASIX) 20 MG tablet Take 120 mg by mouth daily. Patient takes 120 mg(6) tablets by mouth daily. ALTERNATES WITH 35m/3tabs   furosemide (LASIX) 40 MG  tablet Take 120 mg by mouth daily. Alternates with 249m6tabs   metFORMIN (GLUCOPHAGE) 500 MG tablet Take 1 tablet (500 mg total) by mouth 2 (two) times daily with a meal.   metoprolol succinate (TOPROL XL) 25 MG 24 hr tablet Take 1 tablet (25 mg total) by mouth daily.   potassium chloride (KLOR-CON) 20 MEQ packet Take 60 mEq by mouth daily. (Patient taking differently: Take 60 mEq by mouth daily. As needed)   rosuvastatin (CRESTOR) 40 MG tablet Take 1 tablet (40 mg total) by mouth daily.   sacubitril-valsartan (ENTRESTO) 97-103 MG Take 1 tablet by mouth 2 (two) times daily.   Semaglutide, 2 MG/DOSE, (OZEMPIC, 2 MG/DOSE,) 8 MG/3ML SOPN Inject 2 mg into the skin once a week.   spironolactone (ALDACTONE) 25 MG tablet Take 1 tablet (25 mg total) by mouth daily.     Allergies:   Patient has no known allergies.   Social History   Socioeconomic History   Marital status: Single    Spouse name: Not on file   Number of children: Not on file   Years of education: Not on file   Highest education level: Not on file  Occupational History   Not on file  Tobacco Use   Smoking status: Never   Smokeless tobacco: Never  Vaping Use   Vaping Use: Never used  Substance and Sexual Activity   Alcohol use: Not Currently    Comment: special occasions   Drug use: Yes    Types: Marijuana    Comment: last month   Sexual activity: Yes    Partners: Female  Other Topics Concern   Not on file  Social History Narrative   Smokes weed; No smoking cigarettes; no alcohol. 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    Social Determinants of Health   Financial Resource Strain: Not on file  Food Insecurity: Not on file  Transportation Needs: Not on file  Physical Activity: Not on file  Stress: Not on file  Social Connections: Not on file     Family History: The patient's family history includes Cancer in his cousin and cousin; Cerebrovascular Accident in his mother;  Diabetes in his father; Heart failure in his mother; Hypertension in his father.  ROS:   Please see the history of present illness.    All other systems reviewed and are negative.  EKGs/Labs/Other Studies Reviewed:    The following studies were reviewed today:  08/17/2022 Echo  1. Left ventricular ejection fraction, by estimation, is 25 to 30%. The  left ventricle has severely decreased function. The left ventricle has no  regional wall motion abnormalities. The left ventricular internal cavity  size was moderately dilated. Left  ventricular diastolic parameters are consistent with Grade I diastolic  dysfunction (impaired relaxation). The average left ventricular global  longitudinal strain is -7.9 %.   2. No apical thrombus noted   3. Right ventricular systolic function is normal. The right ventricular  size  is normal. There is normal pulmonary artery systolic pressure. The  estimated right ventricular systolic pressure is 42.7 mmHg.   4. Left atrial size was mildly dilated.   5. The mitral valve is normal in structure. No evidence of mitral valve  regurgitation. No evidence of mitral stenosis.   6. The aortic valve is normal in structure. Aortic valve regurgitation is  not visualized. No aortic stenosis is present.   7. The inferior vena cava is normal in size with greater than 50%  respiratory variability, suggesting right atrial pressure of 3 mmHg.    06/21/2022 sinus with narrow QRS.   Recent Labs: 03/17/2022: TSH 5.035 03/21/2022: Magnesium 2.2 05/30/2022: Hemoglobin 16.6; Platelets 333 06/12/2022: ALT 38 09/28/2022: B Natriuretic Peptide 32.1; BUN 10; Creatinine, Ser 1.02; Potassium 3.8; Sodium 138  Recent Lipid Panel    Component Value Date/Time   CHOL 98 (L) 06/12/2022 1140   TRIG 116 06/12/2022 1140   HDL 38 (L) 06/12/2022 1140   CHOLHDL 2.6 06/12/2022 1140   CHOLHDL 4.5 11/06/2021 0417   VLDL 29 11/06/2021 0417   LDLCALC 39 06/12/2022 1140    Physical Exam:     VS:  BP 124/82   Pulse (!) 58   Ht 5' 10"  (1.778 m)   Wt (!) 335 lb (152 kg)   SpO2 95%   BMI 48.07 kg/m     Wt Readings from Last 3 Encounters:  10/10/22 (!) 335 lb (152 kg)  09/28/22 (!) 341 lb 6.4 oz (154.9 kg)  08/20/22 (!) 330 lb (149.7 kg)     GEN:  Well nourished, well developed in no acute distress.  Obese HEENT: Normal NECK: No JVD; No carotid bruits LYMPHATICS: No lymphadenopathy CARDIAC: RRR, no murmurs, rubs, gallops RESPIRATORY:  Clear to auscultation without rales, wheezing or rhonchi  ABDOMEN: Soft, non-tender, non-distended MUSCULOSKELETAL:  No edema; No deformity  SKIN: Warm and dry NEUROLOGIC:  Alert and oriented x 3 PSYCHIATRIC:  Normal affect       ASSESSMENT:    1. Chronic systolic congestive heart failure ([place])   2. Primary hypertension   3. Obesity, Class III, BMI 40-49.9 (morbid obesity) (HCC)    PLAN:    In order of problems listed above:  #Chronic systolic HF NYHA III. Warm and volume up.  Follows with HF clinic. EF persistently low but slightly improved with better adherence to GDMT. Will repeat TTE in November. If still <35, plan for DDD ICD [employer] investment [employer]).  I will plan to see him in a video visit after his echo to review the results and finalize treatment plan.  If we do need to proceed with ICD implant, he will hold his Eliquis for 24 hours prior to implant and restart the day after implant.  This minimal interruption in anticoagulation is secondary to his history of LV and pulmonary thrombi.  -----  The patient has an non ischemic CM (EF <35%), NYHA Class II-III CHF, and CAD.  He is referred by Dr Haroldine Laws for risk stratification of sudden death and consideration of ICD implantation.  At this time, he meets criteria for ICD implantation for primary prevention of sudden death.  I have had a thorough discussion with the patient reviewing options.  The patient and their family (if available) have had opportunities to ask  questions and have them answered.  Risks, benefits, alternatives to ICD implantation were discussed in detail with the patient today. The patient understands that the risks include but are not limited to bleeding, infection, pneumothorax,  perforation, tamponade, vascular damage, renal failure, MI, stroke, death, inappropriate shocks, and lead dislodgement.  #Obesity    Medication Adjustments/Labs and Tests Ordered: Current medicines are reviewed at length with the patient today.  Concerns regarding medicines are outlined above.  No orders of the defined types were placed in this encounter.  No orders of the defined types were placed in this encounter.    Signed, Hilton Cork. Quentin Ore, MD, [facility], [facility] 10/10/2022 9:24 AM    Electrophysiology [facility]

## 2022-10-10 ENCOUNTER — Ambulatory Visit: Payer: BLUE CROSS/BLUE SHIELD | Attending: Cardiology | Admitting: Cardiology

## 2022-10-10 ENCOUNTER — Encounter: Payer: Self-pay | Admitting: Cardiology

## 2022-10-10 VITALS — BP 124/82 | HR 58 | Ht 70.0 in | Wt 335.0 lb

## 2022-10-10 DIAGNOSIS — I10 Essential (primary) hypertension: Secondary | ICD-10-CM | POA: Diagnosis not present

## 2022-10-10 DIAGNOSIS — I5022 Chronic systolic (congestive) heart failure: Secondary | ICD-10-CM | POA: Diagnosis not present

## 2022-10-10 NOTE — Patient Instructions (Addendum)
Medication Instructions:  None  *If you need a refill on your cardiac medications before your next appointment, please call your pharmacy*   Lab Work: none If you have labs (blood work) drawn today and your tests are completely normal, you will receive your results only by: [place] (if you have MyChart) OR A paper copy in the mail If you have any lab test that is abnormal or we need to change your treatment, we will call you to review the results.   Testing/Procedures: none   Follow-Up: At [facility], you and your health needs are our priority.  As part of our continuing mission to provide you with exceptional heart care, we have created designated Provider Care Teams.  These Care Teams include your primary Cardiologist (physician) and Advanced Practice Providers (APPs -  Physician Assistants and Nurse Practitioners) who all work together to provide you with the care you need, when you need it.  We recommend signing up for the patient portal called "MyChart".  Sign up information is provided on this After Visit Summary.  MyChart is used to connect with patients for Virtual Visits (Telemedicine).  Patients are able to view lab/test results, encounter notes, upcoming appointments, etc.  Non-urgent messages can be sent to your provider as well.   To learn more about what you can do with MyChart, go to [email or web address].    Your next appointment:   In December after Echo on 11/20/22  The format for your next appointment:   Virtual Visit   Provider:   Lars Mage, MD    Other Instructions   Important Information About Sugar

## 2022-10-15 ENCOUNTER — Other Ambulatory Visit: Payer: Self-pay | Admitting: Internal Medicine

## 2022-10-23 ENCOUNTER — Other Ambulatory Visit: Payer: Self-pay | Admitting: Pharmacist

## 2022-10-23 MED ORDER — SEMAGLUTIDE (1 MG/DOSE) 4 MG/3ML ~~LOC~~ SOPN: 1.0000 mg | PEN_INJECTOR | SUBCUTANEOUS | 11 refills | Status: DC

## 2022-10-23 MED ORDER — METFORMIN HCL 500 MG PO TABS: 500.0000 mg | ORAL_TABLET | Freq: Two times a day (BID) | ORAL | 1 refills | Status: DC

## 2022-10-23 NOTE — Telephone Encounter (Signed)
Patient called requesting refill on meformin and ozempic. He is currently on 2mg  but requested a 1mg  rx incase the pharmacy cannot get the 2mg .

## 2022-10-29 ENCOUNTER — Encounter (INDEPENDENT_AMBULATORY_CARE_PROVIDER_SITE_OTHER): Payer: Self-pay

## 2022-11-15 ENCOUNTER — Telehealth: Payer: Self-pay | Admitting: Pharmacist

## 2022-11-15 NOTE — Telephone Encounter (Signed)
BIN [id number] PCN [id number] Grp [id number] ID: [id number] [facility] medicaid  Farxiga Key: [id number] Ozempic Key: [id number] approved through 11/15/23 Nexlizet Key: [place]

## 2022-11-20 ENCOUNTER — Other Ambulatory Visit: Payer: 59

## 2022-11-20 NOTE — Telephone Encounter (Signed)
Ozempic and [place] approved. Nexlizet was denied.

## 2022-11-21 ENCOUNTER — Telehealth: Payer: BLUE CROSS/BLUE SHIELD | Admitting: Cardiology

## 2022-11-21 NOTE — Telephone Encounter (Signed)
Patient made aware that [place] and Ozempic approved, Nexlizet denied. Appeals letter has been written but will need patient to sign authorized rep form. He will come by office to sign.

## 2022-12-03 NOTE — Progress Notes (Incomplete)
Advanced Heart Failure Clinic Note  Primary Care: Ellene Route General Cardiologist: Dr. Garen Lah HF Cardiologist: Dr. Haroldine Laws  HPI: Robert Mccann is a 26 y.o. 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  Admitted 6/22 to [facility] with PE. Underwent thrombectomy/thrombolysis of the right upper/middle/lower lobes. Dopplers negative for DVT though it was noted that the patient previously spent 12 hours each way to [place] and [place] and back about a week or 2 prior to admission. Echo showed EF of less than 20% with global hypokinesis and severely reduced RV function. 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 Noted to have intermittent Mobitz 2 heart block during periods of sleep with recommendation for outpatient sleep study. Started on GDMT and discharged home.    Admitted 10/2021 with dysarthria and left hand numbness/weakness. Found to have a large acute posterior right MCA territory infarct. TEE did not show any LA/LAA/LV thrombus, and bubble study was negative.  He reported compliance with Eliquis, which was held in the setting of areas of petechial hemorrhage on follow-up CT.  We discussed potentially switching to warfarin however, patient preferred to remain on Eliquis, and this was resumed once cleared by neurology.  Unable to complete coronary CTA due to inability to maintain adequate heart rate.   Admitted to [facility] 3/23 with CP and SOB. CT chest with small LUL PE, RA clot. Echo EF <20% severe RV dysfunction large LV clot. Transferred to [facility] for further management. No R/LHC with RA and LV clot. PICC placed, milrinone and lasix started. cMRI with LVEF 11%, RVEF 19%, large LV thrombus, criteria for LV non-compaction met, subendocardial LGE which can be seen in CAD and noncompaction, moderate pericardial effusion, moderate TR, mild MR.  Milrinone weaned and GDMT titrated, no beta blocker with wenckebach AV block. Not currently candidate for advanced therapies due to size and noncompliance. Discharged home, weight 309 lbs.  Follow up 4/23, Echo showed EF <20% moderate RV dysfunction large LV clot. Entresto increased and sleep study arranged.  Admitted 5/23 with rectal bleeding. Eliquis held. GI consulted, underwent colonoscopy showing significant bleeding from anal fissure. Had hemorrhoids, diverticulosis and 1 polyp that was resected and clipped. Eliquis resumed before discharge. CBC noted with polycythemia, hgb 19.6, he was referred to hematology at discharge. GDMT remained unchanged.  Follow up 06/21/22 with Dr. Garen Lah, patient self increased Lasix to 120 mg daily as he felt he was not urinating as much on 80 daily.  Today he returns for HF follow up with his wife and daughter and other family members. Says he feels OK but gets tired easily. No edema, orthopnea or PND. Says there are times that lasix doesn't work as well. No bleeding with eliquis. Has finished CR 2 months ago. Does a little walking but not doing too much. Weight up 15 pounds  Sleep study 8/23 AHI 3.4   Echo 8/23: EF 25-30% RV ok    Cardiac Studies: - Echo (4/23): EF < 20%, moderate RV dysfunction with large LV clot.  - cMRI (3/23): with LVEF 11%, RVEF 19%, large LV thrombus, criteria for LV non-compaction met, subendocardial LGE which can be seen in CAD and noncompaction, moderate pericardial effusion, moderate TR, mild MR.   - Echo (3/23): EF <20% severe RV dysfunction large LV clot.  - TEE (11/22): EF 20-25%, global HK LV, severe RV dysfunction, no clot, bubble study negative.  - Echo (6/22):  EF < 20% with global hypokinesis and severely reduced RV function.  ROS: All systems reviewed and negative except as per HPI.   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 05/2021 noted during periods of sleep @ time of hospitalization  for PE.   Cardiomyopathy ([place])    a. 05/2021 Echo: EF <20%, glob HK. [place]:C ratio of 2.1:1 suggesting non-compaction. Gr2 DD. Sev reduced RV fxn. Nl PASP. Mildly dil RA. Mild MR; b. 10/2021 Echo: EF <20%, GrII DD; c. 10/2021 TEE: EF 20-25%, glob HK, sev red RV fxn.   Diabetes mellitus without complication ([place])    HFrEF (heart failure with reduced ejection fraction) ([place])    a. 05/2021 Echo: EF <20%. ? non-compaction.   Hypertension    Pulmonary embolism ([place])    a. 05/2021 CTA Chest: PE extending from lobar arteris of RUL and RLL w/ concern for pulml infarct-->s/p thrombolysis and thrombectomy of RUL/RML/RLL.   Stroke [facility])    a. 10/2021 CT Head: Bilat cerebral infarcts w/ mod-sided acute to subacute R temporoparietal infarct; b. 10/2021 MRI: R post PCA territory infarcts w/o hemorrhage; c. 10/2021 TEE: Neg bubble study. No LA/LAA thrombus. EF 20-25%, glob HK.   Current Outpatient Medications  Medication Sig Dispense Refill   apixaban (ELIQUIS) 5 MG TABS tablet Take 1 tablet (5 mg total) by mouth 2 (two) times daily. 60 tablet 6   Bempedoic Acid-Ezetimibe (NEXLIZET) 180-10 MG TABS Take 1 tablet by mouth daily. 90 tablet 3   Blood Glucose Monitoring Suppl (BLOOD GLUCOSE MONITOR SYSTEM) w/Device KIT Use up to four times daily as directed. 1 kit 3   digoxin (LANOXIN) 0.125 MG tablet Take 1 tablet (0.125 mg total) by mouth daily. 30 tablet 6   FARXIGA 10 MG TABS tablet Take 10 mg by mouth daily.     furosemide (LASIX) 20 MG tablet Take 120 mg by mouth daily. Patient takes 120 mg(6) tablets by mouth daily. ALTERNATES WITH 49m/3tabs     furosemide (LASIX) 40 MG tablet Take 120 mg by mouth daily. Alternates with 234m6tabs     metFORMIN (GLUCOPHAGE) 500 MG tablet Take 1 tablet (500 mg total) by mouth 2 (two) times daily with a meal. 60 tablet 1   metoprolol succinate (TOPROL XL) 25 MG 24 hr tablet Take 1 tablet (25 mg total) by mouth daily. 90 tablet 3   potassium chloride (KLOR-CON) 20 MEQ packet Take  60 mEq by mouth daily. (Patient taking differently: Take 60 mEq by mouth daily. As needed) 90 packet 6   rosuvastatin (CRESTOR) 40 MG tablet Take 1 tablet (40 mg total) by mouth daily. 90 tablet 3   sacubitril-valsartan (ENTRESTO) 97-103 MG Take 1 tablet by mouth 2 (two) times daily. 180 tablet 3   Semaglutide, 1 MG/DOSE, 4 MG/3ML SOPN Inject 1 mg into the skin once a week. 3 mL 11   spironolactone (ALDACTONE) 25 MG tablet Take 1 tablet (25 mg total) by mouth daily. 30 tablet 6   No current facility-administered medications for this visit.   No Known Allergies  Social History   Socioeconomic History   Marital status: Single    Spouse name: Not on file   Number of children: Not on file   Years of education: Not on file   Highest education level: Not on file  Occupational History   Not on file  Tobacco Use   Smoking status: Never   Smokeless tobacco: Never  Vaping Use  Vaping Use: Never used  Substance and Sexual Activity   Alcohol use: Not Currently    Comment: special occasions   Drug use: Yes    Types: Marijuana    Comment: last month   Sexual activity: Yes    Partners: Female  Other Topics Concern   Not on file  Social History Narrative   Smokes weed; No smoking cigarettes; no alcohol. 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 Lives in [place].  NO children.    Social Determinants of Health   Financial Resource Strain: Not on file  Food Insecurity: Not on file  Transportation Needs: Not on file  Physical Activity: Not on file  Stress: Not on file  Social Connections: Not on file  Intimate Partner Violence: Not on file   Family History  Problem Relation Age of Onset   Heart failure Mother    Cerebrovascular Accident Mother    Diabetes Father    Hypertension Father    Cancer Cousin        unknown origin   Cancer Cousin        unknown origing   There were no vitals taken for this visit.  Wt Readings from Last 3 Encounters:  10/10/22 (!) 152 kg (335  lb)  09/28/22 (!) 154.9 kg (341 lb 6.4 oz)  08/20/22 (!) 149.7 kg (330 lb)   PHYSICAL EXAM: General:  Well appearing. No resp difficulty HEENT: normal Neck: supple. no JVD. Carotids 2+ bilat; no bruits. No lymphadenopathy or thryomegaly appreciated. Cor: PMI nondisplaced. Regular rate & rhythm. No rubs, gallops or murmurs. Lungs: clear Abdomen: obese soft, nontender, nondistended. No hepatosplenomegaly. No bruits or masses. Good bowel sounds. Extremities: no cyanosis, clubbing, rash, edema Neuro: alert & orientedx3, cranial nerves grossly intact. moves all 4 extremities w/o difficulty. Affect pleasant   ASSESSMENT & PLAN: 1. 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  - Echo EF < 20% with large LV clot RV several decreased - cMRI (3/23): LVEF 11%, RVEF 19%, large LV thrombus, criteria for LV non-compaction met, subendocardial LGE which can be seen in CAD and noncompaction, moderate pericardial effusion, moderate TR, mild MR - Echo (4/23) EF <20% moderate RV dysfunction large LV clot. - Echo 8/23: EF 25-30% RV ok  - Genetic testing with 2 genes of uncertain significant - Stale NYHA II- early III, limited by fatigue. Volume looks ok. Weight is going up.  - Volume status ok. Conitnue lasix 120 daily - Continue Entresto 97/103 mg bid. - Continue digoxin 0.125 mg daily. - Continue spironolactone 25 mg daily. - Continue Farxiga 10 mg daily. - Start Toprol 25 - Coronary CT 8/23. No CAD  CAC = 0. - Not candidate for advanced therapies currently with size, RV dysfunction. Long discussion about need to get weight off. Continue SGLT2i. Watch diet. Needs more activity.  - Continue CR. - Labs today  - EF slowly getting better with compliance with GDMT.. Repeat echo 3-4 months. If EF still < 35% can consider ICD   2. Large LV thrombus/RA thrombus - RA thrombus noted on CTA chest. - Large LV thrombus on echo and cMRI.in 4/23 - Continue Eliquis 5 mg bid. No bleeding issues. - Labs  today   3. Pericardial effusion - Moderate in size on cMRI. - Trivial on echo 4/23.   4. History of PE s/p thrombectomy and thrombolysis with recurrent new PEs most recent admission - Continue Eliquis as above.   5. History of right posterior PCA territory infarcts (cardioembolic) - At  high risk for recurrent events with large LV thrombus. - Continue Eliquis and statin.  6. Erythrocytosis - Followed by Heme/Onc.  7. HLD - Continue statin + Nexlizet. - LDL 39 (6/23).   8. DM2 - A1c 8.9% 3/23. - Continue SGLT2i and GLP1RA.   9. Morbid obesity - There is no height or weight on file to calculate BMI. - Continue GLP1RA. - Encouraged diet and exercise as above.   Rafael Bihari, FNP  1:46 PM

## 2022-12-07 ENCOUNTER — Encounter (HOSPITAL_COMMUNITY): Payer: 59

## 2022-12-13 ENCOUNTER — Other Ambulatory Visit: Payer: Self-pay | Admitting: Internal Medicine

## 2022-12-18 NOTE — Telephone Encounter (Signed)
Called pt to remind him that we still need his signature to appeal. LVM for pt to call back

## 2022-12-27 ENCOUNTER — Ambulatory Visit: Payer: BLUE CROSS/BLUE SHIELD | Attending: Cardiology

## 2022-12-27 DIAGNOSIS — I428 Other cardiomyopathies: Secondary | ICD-10-CM

## 2022-12-27 LAB — ECHOCARDIOGRAM COMPLETE
AR max vel: 4.58 cm2
AV Area VTI: 4.28 cm2
AV Area mean vel: 4.05 cm2
AV Mean grad: 2 mmHg
AV Peak grad: 4.2 mmHg
Ao pk vel: 1.03 m/s
Area-P 1/2: 4.86 cm2
Calc EF: 34 %
S' Lateral: 5.4 cm
Single Plane A2C EF: 36.4 %
Single Plane A4C EF: 29 %

## 2023-01-01 ENCOUNTER — Encounter (HOSPITAL_COMMUNITY): Payer: Self-pay

## 2023-01-02 ENCOUNTER — Telehealth: Payer: Self-pay | Admitting: *Deleted

## 2023-01-02 NOTE — Telephone Encounter (Signed)
  Patient Consent for Virtual Visit        Robert Mccann has provided verbal consent on 01/02/2023 for a virtual visit (video or telephone).   CONSENT FOR VIRTUAL VISIT FOR:  Robert Mccann  By participating in this virtual visit I agree to the following:  I hereby voluntarily request, consent and authorize [place] and its employed or contracted physicians, physician assistants, nurse practitioners or other licensed health care professionals (the Practitioner), to provide me with telemedicine health care services (the "Services") as deemed necessary by the treating Practitioner. I acknowledge and consent to receive the Services by the Practitioner via telemedicine. I understand that the telemedicine visit will involve communicating with the Practitioner through live audiovisual communication technology and the disclosure of certain medical information by electronic transmission. I acknowledge that I have been given the opportunity to request an in-person assessment or other available alternative prior to the telemedicine visit and am voluntarily participating in the telemedicine visit.  I understand that I have the right to withhold or withdraw my consent to the use of telemedicine in the course of my care at any time, without affecting my right to future care or treatment, and that the Practitioner or I may terminate the telemedicine visit at any time. I understand that I have the right to inspect all information obtained and/or recorded in the course of the telemedicine visit and may receive copies of available information for a reasonable fee.  I understand that some of the potential risks of receiving the Services via telemedicine include:  Delay or interruption in medical evaluation due to technological equipment failure or disruption; Information transmitted may not be sufficient (e.g. poor resolution of images) to allow for appropriate medical decision making by the  Practitioner; and/or  In rare instances, security protocols could fail, causing a breach of personal health information.  Furthermore, I acknowledge that it is my responsibility to provide information about my medical history, conditions and care that is complete and accurate to the best of my ability. I acknowledge that Practitioner's advice, recommendations, and/or decision may be based on factors not within their control, such as incomplete or inaccurate data provided by me or distortions of diagnostic images or specimens that may result from electronic transmissions. I understand that the practice of medicine is not an exact science and that Practitioner makes no warranties or guarantees regarding treatment outcomes. I acknowledge that a copy of this consent can be made available to me via my patient portal ([place]), or I can request a printed copy by calling the office of [place].    I understand that my insurance will be billed for this visit.   I have read or had this consent read to me. I understand the contents of this consent, which adequately explains the benefits and risks of the Services being provided via telemedicine.  I have been provided ample opportunity to ask questions regarding this consent and the Services and have had my questions answered to my satisfaction. I give my informed consent for the services to be provided through the use of telemedicine in my medical care

## 2023-01-09 ENCOUNTER — Telehealth: Payer: Self-pay | Admitting: Pharmacist

## 2023-01-09 ENCOUNTER — Ambulatory Visit: Payer: BLUE CROSS/BLUE SHIELD | Attending: Cardiology | Admitting: Cardiology

## 2023-01-09 ENCOUNTER — Encounter: Payer: Self-pay | Admitting: Pharmacist

## 2023-01-09 VITALS — Ht 70.0 in | Wt 343.0 lb

## 2023-01-09 DIAGNOSIS — I513 Intracardiac thrombosis, not elsewhere classified: Secondary | ICD-10-CM

## 2023-01-09 DIAGNOSIS — I5022 Chronic systolic (congestive) heart failure: Secondary | ICD-10-CM

## 2023-01-09 DIAGNOSIS — I2699 Other pulmonary embolism without acute cor pulmonale: Secondary | ICD-10-CM | POA: Diagnosis not present

## 2023-01-09 NOTE — Patient Instructions (Signed)
Medication Instructions:  Your physician recommends that you continue on your current medications as directed. Please refer to the Current Medication list given to you today.  *If you need a refill on your cardiac medications before your next appointment, please call your pharmacy*   Lab Work: None ordered.  If you have labs (blood work) drawn today and your tests are completely normal, you will receive your results only by: [place] (if you have MyChart) OR A paper copy in the mail If you have any lab test that is abnormal or we need to change your treatment, we will call you to review the results.   Testing/Procedures: We will call you to schedule procedure. Your physician has recommended that you have a defibrillator inserted. An implantable cardioverter defibrillator (ICD) is a small device that is placed in your chest or, in rare cases, your abdomen. This device uses electrical pulses or shocks to help control life-threatening, irregular heartbeats that could lead the heart to suddenly stop beating (sudden cardiac arrest). Leads are attached to the ICD that goes into your heart. This is done in the hospital and usually requires an overnight stay. Please see the instruction sheet given to you today for more information.    Follow-Up: At [facility], you and your health needs are our priority.  As part of our continuing mission to provide you with exceptional heart care, we have created designated Provider Care Teams.  These Care Teams include your primary Cardiologist (physician) and Advanced Practice Providers (APPs -  Physician Assistants and Nurse Practitioners) who all work together to provide you with the care you need, when you need it.  We recommend signing up for the patient portal called "MyChart".  Sign up information is provided on this After Visit Summary.  MyChart is used to connect with patients for Virtual Visits (Telemedicine).  Patients are able to view  lab/test results, encounter notes, upcoming appointments, etc.  Non-urgent messages can be sent to your provider as well.   To learn more about what you can do with MyChart, go to [email or web address].    Your next appointment:   To be scheduled  Important Information About Sugar

## 2023-01-09 NOTE — Telephone Encounter (Signed)
Pt called clinic reporting side effects on Ozempic. Mainly gas, some hair loss, also not losing weight. He was 309 lbs when he first started Ozempic. Now weighs 343 lbs. Wants to try [facility] instead.   Has new insurance this year -   Member ID: [id number] [place]: [id number] PCN: [id number] GRP: [id number]  Also needs Nexlizet PA with new insurance.  Will submit PA for [facility] and Nexlizet.  Tried submitting both PAs and received the same message for both - "Your PA request cannot be processed for the member plan submitted. For further inquiries please contact the number on the back of the member prescription card."  Called pt, provider # [phone]. No account found with his member ID. He thinks he may need to sign something? He will also take a picture of his insurance and send it in via [place] so I can make sure there isn't something I'm missing from my end. He's aware I'm off tomorrow and will take a look on Friday morning when I'm back in the office.

## 2023-01-09 NOTE — Progress Notes (Signed)
Virtual Visit via Video Note   Because of Robert Mccann's co-morbid illnesses, he is at least at moderate risk for complications without adequate follow up.  This format is felt to be most appropriate for this patient at this time.  All issues noted in this document were discussed and addressed.  A limited physical exam was performed with this format.  Please refer to the patient's chart for his consent to telehealth for [facility].       Date:  01/09/2023   ID:  Robert Mccann, DOB May 22, 1996, MRN [id number] The patient was identified using 2 identifiers.  Patient Location: Home Provider Location: Home Office   PCP:  [facility] [place] [facility] Cardiologist:  Kate Sable, MD Electrophysiologist:  Vickie Epley, MD     Evaluation Performed:  Follow-Up Visit  Chief Complaint: Systolic heart failure follow-up  History of Present Illness:    Robert Mccann is a 27 y.o. male who presents for a follow up visit. They were last seen in clinic October 10, 2022.  The patient has a history of chronic systolic heart failure with noncompaction.  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  He has a history of LV thrombus.  At his last appointment, we planned to reassess his LV function before finalizing decision to proceed with an ICD.   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 05/2021 noted during periods of sleep @ time of hospitalization for PE.   Cardiomyopathy ([place])    a. 05/2021 Echo: EF <20%, glob HK. :C ratio of 2.1:1 suggesting non-compaction. Gr2 DD. Sev reduced RV fxn. Nl PASP. Mildly dil RA. Mild MR; b. 10/2021 Echo: EF <20%, GrII DD; c. 10/2021 TEE: EF 20-25%, glob HK, sev red RV fxn.   Diabetes mellitus without complication ([place])    HFrEF (heart failure with reduced ejection fraction) ([place])    a. 05/2021 Echo: EF  <20%. ? non-compaction.   Hypertension    Pulmonary embolism ([place])    a. 05/2021 CTA Chest: PE extending from lobar arteris of RUL and RLL w/ concern for pulml infarct-->s/p thrombolysis and thrombectomy of RUL/RML/RLL.   Stroke [facility])    a. 10/2021 CT Head: Bilat cerebral infarcts w/ mod-sided acute to subacute R temporoparietal infarct; b. 10/2021 MRI: R post PCA territory infarcts w/o hemorrhage; c. 10/2021 TEE: Neg bubble study. No LA/LAA thrombus. EF 20-25%, glob HK.   Past Surgical History:  Procedure Laterality Date   COLONOSCOPY N/A 05/18/2022   Procedure: COLONOSCOPY;  Surgeon: Sharyn Creamer, MD;  Location: [place];  Service: Gastroenterology;  Laterality: N/A;   HAND SURGERY Left age 75   HAND SURGERY     HEMOSTASIS CLIP PLACEMENT  05/18/2022   Procedure: HEMOSTASIS CLIP PLACEMENT;  Surgeon: Sharyn Creamer, MD;  Location: [place];  Service: Gastroenterology;;   POLYPECTOMY  05/18/2022   Procedure: POLYPECTOMY;  Surgeon: Sharyn Creamer, MD;  Location: [place];  Service: Gastroenterology;;   PULMONARY THROMBECTOMY N/A 06/28/2021   Procedure: PULMONARY THROMBECTOMY;  Surgeon: Algernon Huxley, MD;  Location: [place] CV LAB;  Service: Cardiovascular;  Laterality: N/A;   TEE WITHOUT CARDIOVERSION N/A 11/08/2021   Procedure: TRANSESOPHAGEAL ECHOCARDIOGRAM (TEE);  Surgeon: Kate Sable, MD;  Location: [facility] ORS;  Service: Cardiovascular;  Laterality: N/A;     Current Meds  Medication Sig   Bempedoic Acid-Ezetimibe (NEXLIZET) 180-10  MG TABS Take 1 tablet by mouth daily.   Blood Glucose Monitoring Suppl (BLOOD GLUCOSE MONITOR SYSTEM) w/Device KIT Use up to four times daily as directed.   digoxin (LANOXIN) 0.125 MG tablet Take 1 tablet by mouth once daily   ELIQUIS 5 MG TABS tablet Take 1 tablet by mouth twice daily   FARXIGA 10 MG TABS tablet Take 10 mg by mouth daily.   furosemide (LASIX) 20 MG tablet Take 120 mg by mouth daily. Patient takes 120 mg(6) tablets by mouth  daily. ALTERNATES WITH 40mg /3tabs   furosemide (LASIX) 40 MG tablet Take 120 mg by mouth daily. Alternates with 20mg /6tabs   metFORMIN (GLUCOPHAGE) 500 MG tablet Take 1 tablet (500 mg total) by mouth 2 (two) times daily with a meal.   metolazone (ZAROXOLYN) 2.5 MG tablet Take 2.5 mg by mouth daily.   metoprolol succinate (TOPROL XL) 25 MG 24 hr tablet Take 1 tablet (25 mg total) by mouth daily.   potassium chloride (KLOR-CON) 20 MEQ packet Take 60 mEq by mouth daily. (Patient taking differently: Take 60 mEq by mouth daily. As needed)   rosuvastatin (CRESTOR) 40 MG tablet Take 1 tablet (40 mg total) by mouth daily.   sacubitril-valsartan (ENTRESTO) 97-103 MG Take 1 tablet by mouth 2 (two) times daily.   Semaglutide, 1 MG/DOSE, 4 MG/3ML SOPN Inject 1 mg into the skin once a week.   spironolactone (ALDACTONE) 25 MG tablet Take 1 tablet (25 mg total) by mouth daily.     Allergies:   Patient has no known allergies.   Social History   Tobacco Use   Smoking status: Never   Smokeless tobacco: Never  Vaping Use   Vaping Use: Never used  Substance Use Topics   Alcohol use: Not Currently    Comment: special occasions   Drug use: Yes    Types: Marijuana    Comment: last month     Family Hx: The patient's family history includes Cancer in his cousin and cousin; Cerebrovascular Accident in his mother; Diabetes in his father; Heart failure in his mother; Hypertension in his father.  ROS:   Please see the history of present illness.     All other systems reviewed and are negative.   Prior CV studies:   The following studies were reviewed today:  December 27, 2022 echo EF 25 RV normal   Labs/Other Tests and Data Reviewed:    EKG:  No ECG reviewed.  Recent Labs: 03/17/2022: TSH 5.035 03/21/2022: Magnesium 2.2 05/30/2022: Hemoglobin 16.6; Platelets 333 06/12/2022: ALT 38 09/28/2022: B Natriuretic Peptide 32.1; BUN 10; Creatinine, Ser 1.02; Potassium 3.8; Sodium 138   Recent Lipid  Panel Lab Results  Component Value Date/Time   CHOL 98 (L) 06/12/2022 11:40 AM   TRIG 116 06/12/2022 11:40 AM   HDL 38 (L) 06/12/2022 11:40 AM   CHOLHDL 2.6 06/12/2022 11:40 AM   CHOLHDL 4.5 11/06/2021 04:17 AM   LDLCALC 39 06/12/2022 11:40 AM    Wt Readings from Last 3 Encounters:  01/09/23 (!) 343 lb (155.6 kg)  10/10/22 (!) 335 lb (152 kg)  09/28/22 (!) 341 lb 6.4 oz (154.9 kg)         Objective:    Vital Signs:  Ht 5\' 10"  (1.778 m)   Wt (!) 343 lb (155.6 kg)   BMI 49.22 kg/m    VITAL SIGNS:  reviewed  ASSESSMENT & PLAN:    #Chronic systolic heart failure NYHA class III.  EF is persistently low despite treatment with  guideline directed medical therapy.  We discussed proceeding with ICD implant.  He will need to hold his Eliquis for 24 hours prior to implant.  The goal will be to minimize the interruption in his anticoagulation given his history of LV thrombus and pulmonary embolism.  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  He is referred by Dr Haroldine Laws for risk stratification of sudden death and consideration of ICD implantation.  At this time, he meets criteria for ICD implantation for primary prevention of sudden death.  I have had a thorough discussion with the patient reviewing options.  The patient and their family (if available) have had opportunities to ask questions and have them answered.   Risks, benefits, alternatives to ICD implantation were discussed in detail with the patient today. The patient understands that the risks include but are not limited to bleeding, infection, pneumothorax, perforation, tamponade, vascular damage, renal failure, MI, stroke, death, inappropriate shocks, and lead dislodgement.   Time:   Today, I have spent 22 minutes with the patient with telehealth technology discussing the above problems.     Medication Adjustments/Labs and Tests Ordered: Current medicines are reviewed at length with the  patient today.  Concerns regarding medicines are outlined above.   Tests Ordered: No orders of the defined types were placed in this encounter.   Medication Changes: No orders of the defined types were placed in this encounter.     Signed, Vickie Epley, MD  01/09/2023 12:52 PM    [place]

## 2023-01-11 MED ORDER — NEXLIZET 180-10 MG PO TABS: 1.0000 | ORAL_TABLET | Freq: Every day | ORAL | 3 refills | Status: DC

## 2023-01-11 MED ORDER — MOUNJARO 2.5 MG/0.5ML ~~LOC~~ SOAJ: 2.5000 mg | SUBCUTANEOUS | 0 refills | Status: DC

## 2023-01-11 NOTE — Telephone Encounter (Signed)
Patient called back, informed him,we are unable to process his PA either via Covermymeds or phone. Member ID is not recognized by his insurance system.  Patient will call his insurance and find out wah is the ID#.

## 2023-01-11 NOTE — Telephone Encounter (Signed)
Pt sent in picture of his insurance card. Tried resubmitting PA request using his Medicaid ID instead of member ID, still received same notification "Your PA request cannot be processed for the member plan submitted. For further inquiries please contact the number on the back of the member prescription card."  Have tried calling his insurance multiple times, no option to speak to anyone and can't get through the electronic prompts because they aren't recognizing his member ID either.  Will try sending in meds to his pharmacy to see if they can populate PA forms.

## 2023-01-14 NOTE — Telephone Encounter (Signed)
Pt states there was an issue with the member ID on his card that insurance corrected on Friday. Tried resubmitting request, still not working. Will try again on Wednesday and if still having issues will call pt back.

## 2023-01-15 LAB — OPHTHALMOLOGY REPORT-SCANNED

## 2023-01-16 ENCOUNTER — Other Ambulatory Visit: Payer: Self-pay

## 2023-01-16 DIAGNOSIS — I5022 Chronic systolic (congestive) heart failure: Secondary | ICD-10-CM

## 2023-01-16 NOTE — Telephone Encounter (Signed)
Still unable to submit prior authorizations. When I call his insurance, they cannot locate his member ID. Pt will call insurance again.

## 2023-01-16 NOTE — Telephone Encounter (Signed)
3 way call with pt and his insurance company. Still an issue with his insurance they are sorting through. Also said they're having issues with cover my meds. Stated PA forms can be found at: [email or web address]  Will fill out and submit Nexlizet and Mounjaro requests.

## 2023-01-17 NOTE — Telephone Encounter (Signed)
Patient called stating his pharmacy still says his nexlizet and Mounjaro need PA. Advised that we filled out the PA forms yesterday. May take the insurance a few days to process.

## 2023-01-18 ENCOUNTER — Other Ambulatory Visit (HOSPITAL_COMMUNITY): Payer: Self-pay

## 2023-01-18 NOTE — Telephone Encounter (Addendum)
Received PA fax from pharmacy, [place] now working for submission.  Mounjaro not preferred GLP for Medicaid. They require him to have tried and failed 2 other GLPs first. He has only tried Ozempic. Insurance requires him to try either Victoza, Trulicity, Byetta, or Bydureon first. None of these options have great weight loss data but Trulicity is still once weekly and has CV benefit. Spoke with Robert Mccann, he prefers to stay on Ozempic 2mg  weekly. Reviewed heart healthy diet and activity to help more with weight loss. No prior Josem Kaufmann is required for Ozempic, Robert Mccann picked up a refill yesterday.  Will also try submitting Nexlizet PA now, Key: [id number].

## 2023-01-18 NOTE — Addendum Note (Signed)
Addended by: Maiana Hennigan E on: 01/18/2023 03:15 PM   Modules accepted: Orders

## 2023-01-21 MED ORDER — EZETIMIBE 10 MG PO TABS: 10.0000 mg | ORAL_TABLET | Freq: Every day | ORAL | 3 refills | Status: DC

## 2023-01-21 NOTE — Addendum Note (Signed)
Addended by: Retaj Hilbun E on: 01/21/2023 12:48 PM   Modules accepted: Orders

## 2023-01-21 NOTE — Telephone Encounter (Signed)
His Medicaid insurance this year doesn't cover Nexlizet, they want him to take ezetimibe instead. Pt is aware and will start ezetimibe and continue on rosuvastatin 40mg  daily. He sees cardiologist at the beginning of March and will ask for his cholesterol to be rechecked then.

## 2023-01-27 ENCOUNTER — Other Ambulatory Visit: Payer: Self-pay | Admitting: Nurse Practitioner

## 2023-01-27 ENCOUNTER — Other Ambulatory Visit: Payer: Self-pay | Admitting: Internal Medicine

## 2023-01-29 ENCOUNTER — Telehealth: Payer: Self-pay

## 2023-01-29 ENCOUNTER — Encounter: Payer: Self-pay | Admitting: *Deleted

## 2023-01-29 NOTE — Telephone Encounter (Signed)
Refill sent in error.  1 tablet refill sent, was not marked refused.  Medication discontinued.

## 2023-02-04 ENCOUNTER — Telehealth: Payer: Self-pay | Admitting: Cardiology

## 2023-02-04 NOTE — Telephone Encounter (Signed)
Pt advised to hold ozempic 1 week prior to procedure. Pt verbalized understanding.

## 2023-02-04 NOTE — Telephone Encounter (Signed)
Pt c/o medication issue:  1. Name of Medication:   Semaglutide, 2 MG/DOSE, (OZEMPIC, 2 MG/DOSE,) 8 MG/3ML SOPN   2. How are you currently taking this medication (dosage and times per day)?   As prescribed  3. Are you having a reaction (difficulty breathing--STAT)?   No  4. What is your medication issue?   Patient called to report he has stayed on this medication and did not change to [facility].  Patient is following up to see if he would need to stop medication prior to his upcoming procedure.

## 2023-02-12 ENCOUNTER — Other Ambulatory Visit
Admission: RE | Admit: 2023-02-12 | Discharge: 2023-02-12 | Disposition: A | Discharge status: Home / Self Care | Payer: Medicaid Other | Attending: Cardiology | Admitting: Cardiology

## 2023-02-12 ENCOUNTER — Telehealth: Payer: Self-pay | Admitting: Pharmacist

## 2023-02-12 DIAGNOSIS — I5022 Chronic systolic (congestive) heart failure: Secondary | ICD-10-CM | POA: Diagnosis present

## 2023-02-12 LAB — CBC
HCT: 46.3 % (ref 39.0–52.0)
Hemoglobin: 15.2 g/dL (ref 13.0–17.0)
MCH: 29.6 pg (ref 26.0–34.0)
MCHC: 32.8 g/dL (ref 30.0–36.0)
MCV: 90.1 fL (ref 80.0–100.0)
Platelets: 355 10*3/uL (ref 150–400)
RBC: 5.14 MIL/uL (ref 4.22–5.81)
RDW: 11.9 % (ref 11.5–15.5)
WBC: 6.9 10*3/uL (ref 4.0–10.5)
nRBC: 0 % (ref 0.0–0.2)

## 2023-02-12 LAB — BASIC METABOLIC PANEL
Anion gap: 11 (ref 5–15)
BUN: 13 mg/dL (ref 6–20)
CO2: 22 mmol/L (ref 22–32)
Calcium: 8.9 mg/dL (ref 8.9–10.3)
Chloride: 103 mmol/L (ref 98–111)
Creatinine, Ser: 0.93 mg/dL (ref 0.61–1.24)
GFR, Estimated: >60 mL/min (ref >60)
Glucose, Bld: 112 mg/dL — ABNORMAL HIGH (ref 70–99)
Potassium: 4 mmol/L (ref 3.5–5.1)
Sodium: 136 mmol/L (ref 135–145)

## 2023-02-12 NOTE — Telephone Encounter (Signed)
Called called to state he took his Ozempic on Sunday. Monday he woke up and him gums were a little swollen. This AM he woke up and they were a lot more swollen and a little painful. He has take APAP. No issues breathing or swallowing. No new medications or foods. I advised that he take some benadryl. Also call PCP or dentist to rule out infection.  Call us on Friday for follow up. Go to ER if having trouble breathing. He is also on Entresto.

## 2023-02-22 NOTE — Pre-Procedure Instructions (Signed)
Instructed patient on the following items: Arrival time 1:30 Nothing to eat or drink after midnight No meds AM of procedure Responsible person to drive you home and stay with you for 24 hrs Wash with special soap night before and morning of procedure If on anti-coagulant drug instructions Eliquis- last dose Saturday 2/24

## 2023-02-25 ENCOUNTER — Other Ambulatory Visit: Payer: Self-pay

## 2023-02-25 ENCOUNTER — Encounter (HOSPITAL_COMMUNITY)
Admission: RE | Disposition: A | Discharge status: Home / Self Care | Payer: Self-pay | Source: Home / Self Care | Attending: Cardiology

## 2023-02-25 ENCOUNTER — Ambulatory Visit (HOSPITAL_COMMUNITY)
Admission: RE | Admit: 2023-02-25 | Discharge: 2023-02-26 | Disposition: A | Discharge status: Home / Self Care | Payer: Medicaid Other | Attending: Cardiology | Admitting: Cardiology

## 2023-02-25 DIAGNOSIS — Z8249 Family history of ischemic heart disease and other diseases of the circulatory system: Secondary | ICD-10-CM | POA: Insufficient documentation

## 2023-02-25 DIAGNOSIS — I441 Atrioventricular block, second degree: Secondary | ICD-10-CM | POA: Insufficient documentation

## 2023-02-25 DIAGNOSIS — I428 Other cardiomyopathies: Secondary | ICD-10-CM | POA: Insufficient documentation

## 2023-02-25 DIAGNOSIS — Z7901 Long term (current) use of anticoagulants: Secondary | ICD-10-CM | POA: Insufficient documentation

## 2023-02-25 DIAGNOSIS — I5022 Chronic systolic (congestive) heart failure: Secondary | ICD-10-CM | POA: Insufficient documentation

## 2023-02-25 DIAGNOSIS — Z8673 Personal history of transient ischemic attack (TIA), and cerebral infarction without residual deficits: Secondary | ICD-10-CM | POA: Diagnosis not present

## 2023-02-25 DIAGNOSIS — I110 Hypertensive heart disease with heart failure: Principal | ICD-10-CM | POA: Insufficient documentation

## 2023-02-25 DIAGNOSIS — Z823 Family history of stroke: Secondary | ICD-10-CM | POA: Insufficient documentation

## 2023-02-25 DIAGNOSIS — Z7984 Long term (current) use of oral hypoglycemic drugs: Secondary | ICD-10-CM | POA: Insufficient documentation

## 2023-02-25 DIAGNOSIS — Z6841 Body Mass Index (BMI) 40.0 and over, adult: Secondary | ICD-10-CM | POA: Insufficient documentation

## 2023-02-25 DIAGNOSIS — I429 Cardiomyopathy, unspecified: Secondary | ICD-10-CM

## 2023-02-25 DIAGNOSIS — E119 Type 2 diabetes mellitus without complications: Secondary | ICD-10-CM | POA: Insufficient documentation

## 2023-02-25 DIAGNOSIS — Z86711 Personal history of pulmonary embolism: Secondary | ICD-10-CM | POA: Insufficient documentation

## 2023-02-25 DIAGNOSIS — Z95810 Presence of automatic (implantable) cardiac defibrillator: Secondary | ICD-10-CM | POA: Diagnosis present

## 2023-02-25 HISTORY — PX: ICD IMPLANT: EP1208

## 2023-02-25 LAB — GLUCOSE, CAPILLARY
Glucose-Capillary: 127 mg/dL — ABNORMAL HIGH (ref 70–99)
Glucose-Capillary: 105 mg/dL — ABNORMAL HIGH (ref 70–99)

## 2023-02-25 SURGERY — ICD IMPLANT

## 2023-02-25 MED ORDER — DIGOXIN 125 MCG PO TABS
125.0000 ug | ORAL_TABLET | Freq: Every day | ORAL | Status: DC
  Administered 2023-02-26: 125 ug via ORAL
  Filled 2023-02-25: qty 1

## 2023-02-25 MED ORDER — CEFAZOLIN IN SODIUM CHLORIDE 3-0.9 GM/100ML-% IV SOLN
3.0000 g | INTRAVENOUS | Status: AC
  Administered 2023-02-25: 3 g via INTRAVENOUS
  Filled 2023-02-25: qty 100

## 2023-02-25 MED ORDER — FENTANYL CITRATE (PF) 100 MCG/2ML IJ SOLN
INTRAMUSCULAR | Status: AC
  Filled 2023-02-25: qty 2

## 2023-02-25 MED ORDER — HEPARIN (PORCINE) IN NACL 1000-0.9 UT/500ML-% IV SOLN
INTRAVENOUS | Status: DC | PRN
  Administered 2023-02-25: 500 mL

## 2023-02-25 MED ORDER — POVIDONE-IODINE 10 % EX SWAB
2.0000 | Freq: Once | CUTANEOUS | Status: AC
  Administered 2023-02-25: 2 via TOPICAL

## 2023-02-25 MED ORDER — DAPAGLIFLOZIN PROPANEDIOL 10 MG PO TABS
10.0000 mg | ORAL_TABLET | Freq: Every day | ORAL | Status: DC
  Administered 2023-02-26: 10 mg via ORAL
  Filled 2023-02-25: qty 1

## 2023-02-25 MED ORDER — FENTANYL CITRATE (PF) 100 MCG/2ML IJ SOLN
INTRAMUSCULAR | Status: DC | PRN
  Administered 2023-02-25 (×2): 25 ug via INTRAVENOUS

## 2023-02-25 MED ORDER — POTASSIUM CHLORIDE 20 MEQ PO PACK
60.0000 meq | PACK | Freq: Every day | ORAL | Status: DC
  Administered 2023-02-26: 60 meq via ORAL
  Filled 2023-02-25 (×2): qty 3

## 2023-02-25 MED ORDER — EZETIMIBE 10 MG PO TABS
10.0000 mg | ORAL_TABLET | Freq: Every day | ORAL | Status: DC
  Administered 2023-02-26: 10 mg via ORAL
  Filled 2023-02-25: qty 1

## 2023-02-25 MED ORDER — METOLAZONE 5 MG PO TABS
2.5000 mg | ORAL_TABLET | Freq: Every day | ORAL | Status: DC
  Administered 2023-02-26: 2.5 mg via ORAL
  Filled 2023-02-25: qty 1

## 2023-02-25 MED ORDER — CHLORHEXIDINE GLUCONATE 4 % EX LIQD: 4.0000 | Freq: Once | CUTANEOUS | Status: DC

## 2023-02-25 MED ORDER — APIXABAN 5 MG PO TABS: 5.0000 mg | ORAL_TABLET | Freq: Two times a day (BID) | ORAL | Status: DC

## 2023-02-25 MED ORDER — SODIUM CHLORIDE 0.9 % IV SOLN: INTRAVENOUS | Status: DC

## 2023-02-25 MED ORDER — SODIUM CHLORIDE 0.9 % IV SOLN
INTRAVENOUS | Status: AC
  Filled 2023-02-25: qty 2

## 2023-02-25 MED ORDER — SPIRONOLACTONE 12.5 MG HALF TABLET
12.5000 mg | ORAL_TABLET | Freq: Every day | ORAL | Status: DC
  Administered 2023-02-26: 12.5 mg via ORAL
  Filled 2023-02-25: qty 1

## 2023-02-25 MED ORDER — ACETAMINOPHEN 325 MG PO TABS
325.0000 mg | ORAL_TABLET | ORAL | Status: DC | PRN
  Administered 2023-02-26: 650 mg via ORAL
  Filled 2023-02-25: qty 2

## 2023-02-25 MED ORDER — METOPROLOL SUCCINATE ER 25 MG PO TB24
25.0000 mg | ORAL_TABLET | Freq: Every day | ORAL | Status: DC
  Administered 2023-02-26: 25 mg via ORAL
  Filled 2023-02-25: qty 1

## 2023-02-25 MED ORDER — ROSUVASTATIN CALCIUM 20 MG PO TABS
40.0000 mg | ORAL_TABLET | Freq: Every day | ORAL | Status: DC
  Administered 2023-02-26: 40 mg via ORAL
  Filled 2023-02-25: qty 2

## 2023-02-25 MED ORDER — METFORMIN HCL 500 MG PO TABS
500.0000 mg | ORAL_TABLET | Freq: Two times a day (BID) | ORAL | Status: DC
  Administered 2023-02-26: 500 mg via ORAL
  Filled 2023-02-25: qty 1

## 2023-02-25 MED ORDER — SODIUM CHLORIDE 0.9 % IV SOLN
80.0000 mg | INTRAVENOUS | Status: AC
  Administered 2023-02-25: 80 mg

## 2023-02-25 MED ORDER — MIDAZOLAM HCL 5 MG/5ML IJ SOLN
INTRAMUSCULAR | Status: AC
  Filled 2023-02-25: qty 5

## 2023-02-25 MED ORDER — MIDAZOLAM HCL 5 MG/5ML IJ SOLN
INTRAMUSCULAR | Status: DC | PRN
  Administered 2023-02-25 (×2): 1 mg via INTRAVENOUS

## 2023-02-25 MED ORDER — SACUBITRIL-VALSARTAN 97-103 MG PO TABS
1.0000 | ORAL_TABLET | Freq: Two times a day (BID) | ORAL | Status: DC
  Administered 2023-02-25 – 2023-02-26 (×2): 1 via ORAL
  Filled 2023-02-25 (×2): qty 1

## 2023-02-25 MED ORDER — FUROSEMIDE 40 MG PO TABS
120.0000 mg | ORAL_TABLET | Freq: Every day | ORAL | Status: DC
  Administered 2023-02-26: 120 mg via ORAL
  Filled 2023-02-25: qty 3

## 2023-02-25 MED ORDER — LIDOCAINE HCL (PF) 1 % IJ SOLN
INTRAMUSCULAR | Status: DC | PRN
  Administered 2023-02-25: 50 mL

## 2023-02-25 MED ORDER — ONDANSETRON HCL 4 MG/2ML IJ SOLN: 4.0000 mg | Freq: Four times a day (QID) | INTRAMUSCULAR | Status: DC | PRN

## 2023-02-25 SURGICAL SUPPLY — 8 items
CABLE SURGICAL S-101-97-12 (CABLE) ×1 IMPLANT
ICD VIGILANT DR D233 (Pacemaker) IMPLANT
LEAD INGEVITY 7841 52 (Lead) IMPLANT
LEAD RELIANCE 0673 IMPLANT
PAD DEFIB RADIO PHYSIO CONN (PAD) ×1 IMPLANT
SHEATH 7FR PRELUDE SNAP 13 (SHEATH) IMPLANT
SHEATH 8FR PRELUDE SNAP 13 (SHEATH) IMPLANT
TRAY PACEMAKER INSERTION (PACKS) ×1 IMPLANT

## 2023-02-25 NOTE — H&P (Signed)
Electrophysiology Office Note:     Date:  02/25/2023    ID:  Robert Mccann, DOB 10/04/1996, MRN [id number]   PCP:  Ellene Route           [facility] Cardiologist:  Kate Sable, MD  [facility] Electrophysiologist:  Vickie Epley, MD    Referring MD: Kate Sable, MD    Chief Complaint: chronic systolic HF   History of Present Illness:     Robert Mccann is a 27 y.o. male who presents for an evaluation of chronic systolic HF at the request of Dr Garen Lah. Their medical history includes NICM w noncompaction, intermittent mobitz II, CVA, PE post thrombectomy, DM, HTN, obesity.  He has had genetic testing with 2 variants of unknown significance identified.   In June 2022 was admitted to [facility] with PE and received lytics/thrombectomy. EF <20% at that time. In November admitted with CVA.   Admitted 3/23 to [facility]. EF < 20%. Large LV thrombus. Required milrinone/lasix. Has LV noncompaction.    Admitted 5/23 with rectal bleeding.    He is referred to discuss ICD therapy.   He is with his wife today in clinic.  He feels well.  He is taking his medications without any issues.   He confirms an extensive family history of early onset heart failure and cardiac death.  His mother has a defibrillator that was implanted in her 59s.   Presents for ICD implant today.   Objective      Past Medical History:  Diagnosis Date   AV block, Mobitz 2      a. 05/2021 noted during periods of sleep @ time of hospitalization for PE.   Cardiomyopathy ([place])      a. 05/2021 Echo: EF <20%, glob HK. [place]:C ratio of 2.1:1 suggesting non-compaction. Gr2 DD. Sev reduced RV fxn. Nl PASP. Mildly dil RA. Mild MR; b. 10/2021 Echo: EF <20%, GrII DD; c. 10/2021 TEE: EF 20-25%, glob HK, sev red RV fxn.   Diabetes mellitus without complication ([place])     HFrEF (heart failure with reduced ejection fraction) ([place])      a. 05/2021 Echo: EF <20%. ? non-compaction.   Hypertension     Pulmonary  embolism ([place])      a. 05/2021 CTA Chest: PE extending from lobar arteris of RUL and RLL w/ concern for pulml infarct-->s/p thrombolysis and thrombectomy of RUL/RML/RLL.   Stroke [facility])      a. 10/2021 CT Head: Bilat cerebral infarcts w/ mod-sided acute to subacute R temporoparietal infarct; b. 10/2021 MRI: R post PCA territory infarcts w/o hemorrhage; c. 10/2021 TEE: Neg bubble study. No LA/LAA thrombus. EF 20-25%, glob HK.           Past Surgical History:  Procedure Laterality Date   COLONOSCOPY N/A 05/18/2022    Procedure: COLONOSCOPY;  Surgeon: Sharyn Creamer, MD;  Location: [place];  Service: Gastroenterology;  Laterality: N/A;   HAND SURGERY Left age 61   HAND SURGERY       HEMOSTASIS CLIP PLACEMENT   05/18/2022    Procedure: HEMOSTASIS CLIP PLACEMENT;  Surgeon: Sharyn Creamer, MD;  Location: [place];  Service: Gastroenterology;;   POLYPECTOMY   05/18/2022    Procedure: POLYPECTOMY;  Surgeon: Sharyn Creamer, MD;  Location: [place];  Service: Gastroenterology;;   PULMONARY THROMBECTOMY N/A 06/28/2021    Procedure: PULMONARY THROMBECTOMY;  Surgeon: Algernon Huxley, MD;  Location: [place] CV LAB;  Service: Cardiovascular;  Laterality: N/A;   TEE WITHOUT  CARDIOVERSION N/A 11/08/2021    Procedure: TRANSESOPHAGEAL ECHOCARDIOGRAM (TEE);  Surgeon: Kate Sable, MD;  Location: [facility] OR[facility];  Service: Cardiovascular;  Laterality: N/A;      Current Medications: Active Medications      Current Meds  Medication Sig   apixaban (ELIQUIS) 5 MG TABS tablet Take 1 tablet (5 mg total) by mouth 2 (two) times daily.   Bempedoic Acid-Ezetimibe (NEXLIZET) 180-10 MG TABS Take 1 tablet by mouth daily.   Blood Glucose Monitoring Suppl (BLOOD GLUCOSE MONITOR SYSTEM) w/Device KIT Use up to four times daily as directed.   digoxin (LANOXIN) 0.125 MG tablet Take 1 tablet (0.125 mg total) by mouth daily.   FARXIGA 10 MG TABS tablet Take 10 mg by mouth daily.   furosemide (LASIX) 20 MG tablet Take  120 mg by mouth daily. Patient takes 120 mg(6) tablets by mouth daily. ALTERNATES WITH '40mg'$ /3tabs   furosemide (LASIX) 40 MG tablet Take 120 mg by mouth daily. Alternates with '20mg'$ /6tabs   metFORMIN (GLUCOPHAGE) 500 MG tablet Take 1 tablet (500 mg total) by mouth 2 (two) times daily with a meal.   metoprolol succinate (TOPROL XL) 25 MG 24 hr tablet Take 1 tablet (25 mg total) by mouth daily.   potassium chloride (KLOR-CON) 20 MEQ packet Take 60 mEq by mouth daily. (Patient taking differently: Take 60 mEq by mouth daily. As needed)   rosuvastatin (CRESTOR) 40 MG tablet Take 1 tablet (40 mg total) by mouth daily.   sacubitril-valsartan (ENTRESTO) 97-103 MG Take 1 tablet by mouth 2 (two) times daily.   Semaglutide, 2 MG/DOSE, (OZEMPIC, 2 MG/DOSE,) 8 MG/3ML SOPN Inject 2 mg into the skin once a week.   spironolactone (ALDACTONE) 25 MG tablet Take 1 tablet (25 mg total) by mouth daily.        Allergies:   Patient has no known allergies.    Social History         Socioeconomic History   Marital status: Single      Spouse name: Not on file   Number of children: Not on file   Years of education: Not on file   Highest education level: Not on file  Occupational History   Not on file  Tobacco Use   Smoking status: Never   Smokeless tobacco: Never  Vaping Use   Vaping Use: Never used  Substance and Sexual Activity   Alcohol use: Not Currently      Comment: special occasions   Drug use: Yes      Types: Marijuana      Comment: last month   Sexual activity: Yes      Partners: Female  Other Topics Concern   Not on file  Social History Narrative    Smokes weed; No smoking cigarettes; no alcohol. 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 Lives in [place].  NO children.     Social Determinants of Health    Financial Resource Strain: Not on file  Food Insecurity: Not on file  Transportation Needs: Not on file  Physical Activity: Not on file  Stress: Not on file  Social  Connections: Not on file      Family History: The patient's family history includes Cancer in his cousin and cousin; Cerebrovascular Accident in his mother; Diabetes in his father; Heart failure in his mother; Hypertension in his father.   ROS:   Please see the history of present illness.    All other systems reviewed and are negative.   EKGs/Labs/Other Studies Reviewed:  The following studies were reviewed today:   08/17/2022 Echo  1. Left ventricular ejection fraction, by estimation, is 25 to 30%. The  left ventricle has severely decreased function. The left ventricle has no  regional wall motion abnormalities. The left ventricular internal cavity  size was moderately dilated. Left  ventricular diastolic parameters are consistent with Grade I diastolic  dysfunction (impaired relaxation). The average left ventricular global  longitudinal strain is -7.9 %.   2. No apical thrombus noted   3. Right ventricular systolic function is normal. The right ventricular  size is normal. There is normal pulmonary artery systolic pressure. The  estimated right ventricular systolic pressure is [id number] mmHg.   4. Left atrial size was mildly dilated.   5. The mitral valve is normal in structure. No evidence of mitral valve  regurgitation. No evidence of mitral stenosis.   6. The aortic valve is normal in structure. Aortic valve regurgitation is  not visualized. No aortic stenosis is present.   7. The inferior vena cava is normal in size with greater than 50%  respiratory variability, suggesting right atrial pressure of 3 mmHg.      06/21/2022 sinus with narrow QRS.     Recent Labs: 03/17/2022: TSH 5.035 03/21/2022: Magnesium 2.2 05/30/2022: Hemoglobin 16.6; Platelets 333 06/12/2022: ALT 38 09/28/2022: B Natriuretic Peptide 32.1; BUN 10; Creatinine, Ser 1.02; Potassium 3.8; Sodium 138  Recent Lipid Panel Labs (Brief)          Component Value Date/Time    CHOL 98 (L) 06/12/2022 1140    TRIG  116 06/12/2022 1140    HDL 38 (L) 06/12/2022 1140    CHOLHDL 2.6 06/12/2022 1140    CHOLHDL 4.5 11/06/2021 0417    VLDL 29 11/06/2021 0417    LDLCALC 39 06/12/2022 1140        Physical Exam:     VS:  BP 136/90   Pulse 79   Ht '5\' 10"'$  (1.778 m)   Wt (!) 335 lb (152 kg)   SpO2 95%   BMI 48.07 kg/m         Wt Readings from Last 3 Encounters:  10/10/22 (!) 335 lb (152 kg)  09/28/22 (!) 341 lb 6.4 oz (154.9 kg)  08/20/22 (!) 330 lb (149.7 kg)      GEN:  Well nourished, well developed in no acute distress.  Obese HEENT: Normal NECK: No JVD; No carotid bruits LYMPHATICS: No lymphadenopathy CARDIAC: RRR, no murmurs, rubs, gallops RESPIRATORY:  Clear to auscultation without rales, wheezing or rhonchi  ABDOMEN: Soft, non-tender, non-distended MUSCULOSKELETAL:  No edema; No deformity  SKIN: Warm and dry NEUROLOGIC:  Alert and oriented x 3 PSYCHIATRIC:  Normal affect          Assessment ASSESSMENT:     1. Chronic systolic congestive heart failure ([place])   2. Primary hypertension   3. Obesity, Class III, BMI 40-49.9 (morbid obesity) (HCC)     PLAN:     In order of problems listed above:   #Chronic systolic HF NYHA III. Warm and volume up.  Follows with HF clinic. EF persistently low but slightly improved with better adherence to GDMT. Will repeat TTE in November. If still <35, plan for DDD ICD [employer] investment [employer]).   I will plan to see him in a video visit after his echo to review the results and finalize treatment plan.   If we do need to proceed with ICD implant, he will hold his Eliquis for 24 hours prior to implant  and restart the day after implant.  This minimal interruption in anticoagulation is secondary to his history of LV and pulmonary thrombi.   -----   The patient has an non ischemic CM (EF <35%), NYHA Class II-III CHF, and CAD.  He is referred by Dr Haroldine Laws for risk stratification of sudden death and consideration of ICD implantation.  At this time, he  meets criteria for ICD implantation for primary prevention of sudden death.  I have had a thorough discussion with the patient reviewing options.  The patient and their family (if available) have had opportunities to ask questions and have them answered.   Risks, benefits, alternatives to ICD implantation were discussed in detail with the patient today. The patient understands that the risks include but are not limited to bleeding, infection, pneumothorax, perforation, tamponade, vascular damage, renal failure, MI, stroke, death, inappropriate shocks, and lead dislodgement.    Presents for ICD implant.    Signed, Hilton Cork. Quentin Ore, MD, [facility] 02/25/2023 Electrophysiology [facility]

## 2023-02-26 ENCOUNTER — Ambulatory Visit (HOSPITAL_COMMUNITY): Payer: Medicaid Other

## 2023-02-26 ENCOUNTER — Encounter (HOSPITAL_COMMUNITY): Payer: Self-pay | Admitting: Cardiology

## 2023-02-26 DIAGNOSIS — I110 Hypertensive heart disease with heart failure: Secondary | ICD-10-CM | POA: Diagnosis not present

## 2023-02-26 DIAGNOSIS — I429 Cardiomyopathy, unspecified: Secondary | ICD-10-CM

## 2023-02-26 LAB — BASIC METABOLIC PANEL
Anion gap: 12 (ref 5–15)
BUN: 13 mg/dL (ref 6–20)
CO2: 21 mmol/L — ABNORMAL LOW (ref 22–32)
Calcium: 8.4 mg/dL — ABNORMAL LOW (ref 8.9–10.3)
Chloride: 101 mmol/L (ref 98–111)
Creatinine, Ser: 0.98 mg/dL (ref 0.61–1.24)
GFR, Estimated: >60 mL/min (ref >60)
Glucose, Bld: 151 mg/dL — ABNORMAL HIGH (ref 70–99)
Potassium: 3.7 mmol/L (ref 3.5–5.1)
Sodium: 134 mmol/L — ABNORMAL LOW (ref 135–145)

## 2023-02-26 LAB — GLUCOSE, CAPILLARY: Glucose-Capillary: 155 mg/dL — ABNORMAL HIGH (ref 70–99)

## 2023-02-26 NOTE — Plan of Care (Signed)

## 2023-02-26 NOTE — Plan of Care (Signed)

## 2023-02-26 NOTE — Discharge Summary (Signed)
ELECTROPHYSIOLOGY PROCEDURE DISCHARGE SUMMARY    Patient ID: Robert Mccann,  MRN: [id number], DOB/AGE: 1996-10-16 27 y.o.  Admit date: 02/25/2023 Discharge date: 02/26/2023  Primary Care Physician: Ellene Route  Primary Cardiologist: Kate Sable, MD  HF Cardiologist: Pierre Bali, MD Electrophysiologist: Dr. 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  Implantation of a [employer] dual chamber ICD on 02/25/2022 by Dr. Quentin Ore.  The patient received a Human resources officer EL ICD [employer] [phone] right atrial lead and [employer] [phone] right mid-septum lead. Pt did not require an LV lead. DFTs were deferred at time of implant There were no post procedure complications 2.  CXR on 02/26/26 demonstrated no pneumothorax status post device implantation.      Brief HPI: Robert Mccann is a 27 y.o. male was referred to electrophysiology in the outpatient setting  for consideration of ICD implantation.  Past medical history includes above.  The patient has persistent LV dysfunction despite guideline directed therapy.  Risks, benefits, and alternatives to ICD implantation were reviewed with the patient who wished to proceed.   Hospital Course:  The patient was admitted and underwent implantation of a [employer] dual chamber ICD with details as outlined above. They were monitored on telemetry overnight which demonstrated NSR .  Left chest was without hematoma or ecchymosis. Pressure dressing re-applied prior to discharge. This will stay in place until device clinic appointment scheduled later this week. The device was interrogated and found to be functioning normally.  CXR was obtained and demonstrated no pneumothorax status post device implantation..  Wound care, arm mobility, and restrictions  were reviewed with the patient.  The patient was examined and considered stable for discharge to home.   The patient's discharge medications include an ACE-I/ARB/ARNI and beta blocker. Do not fill out prior to discharge medication reconciliation  Anticoagulation resumption This patient should resume their Eliquis on 02/26/22 PM.  Physical Exam: Vitals:   02/25/23 2151 02/26/23 0053 02/26/23 0348 02/26/23 0736  BP:  114/68 110/75 (!) 123/92  Pulse:  70 61 81  Resp: '17 18 15 20  '$ Temp:  (!) 97.5 F (36.4 C) 98.2 F (36.8 C) 97.8 F (36.6 C)  TempSrc:  Oral Oral Oral  SpO2:    99%  Weight:      Height:        GEN- NAD. A&O x 3.  HEENT: Normocephalic, atraumatic Lungs- CTAB, normal effort.  Heart- RRR. No M/G/R.  GI- Soft, NT, ND.  Extremities- No clubbing, cyanosis, or edema Skin- Warm and dry, no rash or lesion. ICD site soft without erythema. Pressure dressing to be left in placed.  Discharge Medications:  Allergies as of 02/26/2023   No Known Allergies      Medication List     STOP taking these medications    metoprolol tartrate 50 MG tablet Commonly known as: LOPRESSOR       TAKE these medications    Blood Glucose Monitor System w/Device Kit Use up to four times daily as directed.   digoxin 0.125 MG tablet Commonly known as: LANOXIN Take 1 tablet by mouth once daily   Eliquis 5 MG Tabs tablet Generic drug: apixaban Take 1 tablet by mouth twice daily   Entresto 97-103 MG Generic drug: sacubitril-valsartan Take 1 tablet by mouth 2 (two) times daily.  ezetimibe 10 MG tablet Commonly known as: ZETIA Take 1 tablet (10 mg total) by mouth daily.   Farxiga 10 MG Tabs tablet Generic drug: dapagliflozin propanediol TAKE 1 TABLET (10 MG) BY MOUTH DAILY   furosemide 20 MG tablet Commonly known as: LASIX Take 120 mg by mouth daily. Patient takes 120 mg(6) tablets by mouth daily. ALTERNATES WITH '40mg'$ /3tabs   furosemide 40 MG tablet Commonly known as:  LASIX Take 120 mg by mouth daily. Alternates with '20mg'$ /6tabs   melatonin 5 MG Tabs Take 5 mg by mouth at bedtime as needed.   metFORMIN 500 MG tablet Commonly known as: GLUCOPHAGE Take 1 tablet (500 mg total) by mouth 2 (two) times daily with a meal.   metolazone 2.5 MG tablet Commonly known as: ZAROXOLYN Take 2.5 mg by mouth daily.   metoprolol succinate 25 MG 24 hr tablet Commonly known as: Toprol XL Take 1 tablet (25 mg total) by mouth daily.   Ozempic (2 MG/DOSE) 8 MG/3ML Sopn Generic drug: Semaglutide (2 MG/DOSE) Inject 2 mg into the skin every Wednesday.   potassium chloride 20 MEQ packet Commonly known as: KLOR-CON Take 60 mEq by mouth daily. What changed:  how much to take when to take this reasons to take this   rosuvastatin 40 MG tablet Commonly known as: CRESTOR Take 1 tablet (40 mg total) by mouth daily.   spironolactone 25 MG tablet Commonly known as: ALDACTONE Take 1/2 (one-half) tablet by mouth once daily        Disposition:     Duration of Discharge Encounter: Greater than 30 minutes including physician time.  Signed, Mamie Levers, NP  02/26/2023 9:30 AM

## 2023-03-01 ENCOUNTER — Ambulatory Visit: Payer: Medicaid Other | Attending: Cardiovascular Disease

## 2023-03-01 DIAGNOSIS — Z95810 Presence of automatic (implantable) cardiac defibrillator: Secondary | ICD-10-CM

## 2023-03-01 NOTE — Progress Notes (Signed)
Pressure dressing removed from wound site.  No signs of hematoma or abnormal pocket swelling. Only minimal dried blood noted on strips in place under pressure dressing.  Steri strips in place and intact and will be left in place until see in device clinic next week.  Wound continues to heal WNL. No signs or symptoms of infection.   Wound care instructions given for patient to continue to hold showering and getting wound/bandage area wet until he sees the device clinic next week.  At that time they will determine when he is released to shower and go over further instructions.     Patient does describe a "pulling" sensation at device site area.  Instructed this is likely residual from having the large pressure dressing in place and healing from from the device location.  If is not improving, can discuss more next week at device check.  Patient verbalizes understanding and no further questions at this time.

## 2023-03-04 ENCOUNTER — Ambulatory Visit: Payer: Medicaid Other | Attending: Cardiology | Admitting: Cardiology

## 2023-03-04 ENCOUNTER — Other Ambulatory Visit (HOSPITAL_COMMUNITY): Payer: Self-pay

## 2023-03-05 ENCOUNTER — Encounter: Payer: Self-pay | Admitting: Cardiology

## 2023-03-07 ENCOUNTER — Ambulatory Visit: Payer: Medicaid Other | Attending: Cardiovascular Disease

## 2023-03-07 DIAGNOSIS — I5022 Chronic systolic (congestive) heart failure: Secondary | ICD-10-CM

## 2023-03-07 DIAGNOSIS — R001 Bradycardia, unspecified: Secondary | ICD-10-CM

## 2023-03-07 LAB — CUP PACEART INCLINIC DEVICE CHECK
Date Time Interrogation Session: 20240307122916
HighPow Impedance: 68 Ohm
Implantable Lead Connection Status: 753985
Implantable Lead Connection Status: 753985
Implantable Lead Implant Date: 20240227
Implantable Lead Implant Date: 20240227
Implantable Lead Location: 753859
Implantable Lead Location: 753860
Implantable Lead Model: 673
Implantable Lead Model: 7841
Implantable Lead Serial Number: 1407551
Implantable Lead Serial Number: 209112
Implantable Pulse Generator Implant Date: 20240227
Lead Channel Impedance Value: 413 Ohm
Lead Channel Impedance Value: 667 Ohm
Lead Channel Pacing Threshold Amplitude: 0.4 V
Lead Channel Pacing Threshold Amplitude: 1 V
Lead Channel Pacing Threshold Pulse Width: 0.4 ms
Lead Channel Pacing Threshold Pulse Width: 0.4 ms
Lead Channel Sensing Intrinsic Amplitude: 21.6 mV
Lead Channel Sensing Intrinsic Amplitude: 6.3 mV
Lead Channel Setting Pacing Amplitude: 3.5 V
Lead Channel Setting Pacing Amplitude: 3.5 V
Lead Channel Setting Pacing Pulse Width: 0.4 ms
Lead Channel Setting Sensing Sensitivity: 0.5 mV
Pulse Gen Serial Number: 652491
Zone Setting Status: 755011

## 2023-03-07 NOTE — Progress Notes (Signed)

## 2023-03-07 NOTE — Patient Instructions (Signed)
After Your ICD (Implantable Cardiac Defibrillator)    Monitor your defibrillator site for redness, swelling, and drainage. Call the device clinic at [phone] if you experience these symptoms or fever/chills.  Your incision was closed with Steri-strips or staples:  You may shower 7 days after your procedure and wash your incision with soap and water. Avoid lotions, ointments, or perfumes over your incision until it is well-healed.  You may use a hot tub or a pool after your wound check appointment if the incision is completely closed.  Do not lift, push or pull greater than 10 pounds with the affected arm until 6 weeks after your procedure.   UNTIL AFTER APRIL 9TH. There are no other restrictions in arm movement after your wound check appointment.  Your ICD is designed to protect you from life threatening heart rhythms. Because of this, you may receive a shock.   1 shock with no symptoms:  Call the office during business hours. 1 shock with symptoms (chest pain, chest pressure, dizziness, lightheadedness, shortness of breath, overall feeling unwell):  Call 911. If you experience 2 or more shocks in 24 hours:  Call 911. If you receive a shock, you should not drive.  [place] DMV - no driving for 6 months if you receive appropriate therapy from your ICD.   ICD Alerts:  Some alerts are vibratory and others beep. These are NOT emergencies. Please call our office to let us know. If this occurs at night or on weekends, it can wait until the next business day. Send a remote transmission.  If your device is capable of reading fluid status (for heart failure), you will be offered monthly monitoring to review this with you.   Remote monitoring is used to monitor your ICD from home. This monitoring is scheduled every 91 days by our office. It allows [place] to keep an eye on the functioning of your device to ensure it is working properly. You will routinely see your Electrophysiologist annually (more often if  necessary).

## 2023-03-09 ENCOUNTER — Emergency Department
Admission: EM | Admit: 2023-03-09 | Discharge: 2023-03-09 | Disposition: A | Discharge status: Home / Self Care | Payer: Medicaid Other | Attending: Emergency Medicine | Admitting: Emergency Medicine

## 2023-03-09 ENCOUNTER — Other Ambulatory Visit: Payer: Self-pay

## 2023-03-09 ENCOUNTER — Emergency Department: Payer: Medicaid Other

## 2023-03-09 DIAGNOSIS — Z0000 Encounter for general adult medical examination without abnormal findings: Secondary | ICD-10-CM | POA: Insufficient documentation

## 2023-03-09 DIAGNOSIS — Z4502 Encounter for adjustment and management of automatic implantable cardiac defibrillator: Secondary | ICD-10-CM | POA: Diagnosis present

## 2023-03-09 NOTE — ED Triage Notes (Signed)
Patient recently had ICD placed and reached up to turn the fan down in his house when he felt a "pop" and instantly realized he wasn't suppose to do that; He does not have any pain and appears quite well; Is here today to evaluate for possible ICD problem

## 2023-03-09 NOTE — ED Provider Notes (Signed)
[facility] Provider Note    Event Date/Time   First MD Initiated Contact with Patient 03/09/23 1536     (approximate)  History   Chief Complaint: AICD Problem (Patient recently had ICD placed and reached up to turn the fan down in his house when he felt a "pop" and instantly realized he wasn't suppose to do that; He does not have any pain and appears quite well; Is here today to evaluate for possible ICD problem)  HPI  Robert Mccann is a 27 y.o. male with a past medical history of cardiomyopathy, heart block status post AICD placed 2 weeks ago who presents to the emergency department for AICD evaluation.  According to the patient he was cleaning his room when he reached up to turn the light off he felt a pop he is not sure if the polyp was in his left chest or in his shoulder one of his joints may have popped.  Patient states he felt like his heart could have been racing but then it stopped.  States he was concerned and his fiance was coming to the emergency department for evaluation anyway so he came with her to be evaluated.  Denies any discomfort currently.  No shortness of breath.  Physical Exam   Triage Vital Signs: ED Triage Vitals  Enc Vitals Group     BP 03/09/23 1506 126/78     Pulse Rate 03/09/23 1506 77     Resp 03/09/23 1506 18     Temp 03/09/23 1506 98.2 F (36.8 C)     Temp Source 03/09/23 1506 Oral     SpO2 03/09/23 1506 96 %     Weight 03/09/23 1505 (!) 360 lb (163.3 kg)     Height 03/09/23 1505 '5\' 10"'$  (1.778 m)     Head Circumference --      Peak Flow --      Pain Score 03/09/23 1505 0     Pain Loc --      Pain Edu? --      Excl. in [place]? --     Most recent vital signs: Vitals:   03/09/23 1506  BP: 126/78  Pulse: 77  Resp: 18  Temp: 98.2 F (36.8 C)  SpO2: 96%    General: Awake, no distress.  CV:  Good peripheral perfusion.  Regular rate and rhythm  Resp:  Normal effort.  Equal breath sounds bilaterally.  Patient has a  well-appearing incision able to palpate the AICD no obvious abnormality seen on my evaluation. Abd:  No distention.  Soft, nontender.  No rebound or guarding.   ED Results / Procedures / Treatments    RADIOLOGY  I have reviewed and interpreted the x-ray images.  Leads appear to be appropriately placed. Radiology is read the x-ray as no acute finding with stable cardiomegaly and stable appearance of the left-sided pacemaker with lead tips projecting over the right atrium and ventricle.   MEDICATIONS ORDERED IN ED: Medications - No data to display   IMPRESSION / MDM / [place] / ED COURSE  I reviewed the triage vital signs and the nursing notes.  Patient's presentation is most consistent with acute presentation with potential threat to life or bodily function.  Patient presents emergency department for evaluation.  Overall patient appears well, no distress.  Reassuring x-ray reassuring physical exam, reassuring vital signs.  I believe the patient is safe for discharge home with routine follow-up.  Discussed return precautions.  Patient agreeable to  plan.  FINAL CLINICAL IMPRESSION(S) / ED DIAGNOSES   Medical evaluation    Note:  This document was prepared using Dragon voice recognition software and may include unintentional dictation errors.   Harvest Dark, MD 03/09/23 1547

## 2023-03-11 ENCOUNTER — Other Ambulatory Visit: Payer: Self-pay | Admitting: Internal Medicine

## 2023-03-15 ENCOUNTER — Other Ambulatory Visit (HOSPITAL_COMMUNITY): Payer: Self-pay

## 2023-03-15 ENCOUNTER — Telehealth: Payer: Self-pay | Admitting: Pharmacist

## 2023-03-15 MED ORDER — APIXABAN 5 MG PO TABS: 5.0000 mg | ORAL_TABLET | Freq: Two times a day (BID) | ORAL | 0 refills | Status: DC

## 2023-03-15 MED ORDER — OZEMPIC (2 MG/DOSE) 8 MG/3ML ~~LOC~~ SOPN: 2.0000 mg | PEN_INJECTOR | SUBCUTANEOUS | 3 refills | Status: DC

## 2023-03-15 MED ORDER — DIGOXIN 125 MCG PO TABS: 125.0000 ug | ORAL_TABLET | Freq: Every day | ORAL | 0 refills | Status: DC

## 2023-03-15 NOTE — Telephone Encounter (Signed)
Patient called requesting refill on Ozempic, Eliquis, digoxin. Reports tolerating Ozempic 2 mg dose well without any s/e. Will send prescription for all except digoxin since it's managed by HF clinic. We will route to HF clinic.

## 2023-03-19 ENCOUNTER — Other Ambulatory Visit: Payer: Self-pay | Admitting: Pharmacist

## 2023-03-19 MED ORDER — METFORMIN HCL 500 MG PO TABS: 500.0000 mg | ORAL_TABLET | Freq: Two times a day (BID) | ORAL | 1 refills | Status: DC

## 2023-05-02 ENCOUNTER — Telehealth (HOSPITAL_COMMUNITY): Payer: Self-pay

## 2023-05-02 NOTE — Telephone Encounter (Signed)
Called to confirm/remind patient of their appointment at the [facility] on 05/03/23.   Patient reminded to bring all medications and/or complete list.  Confirmed patient has transportation. Gave directions, instructed to utilize valet parking.  Confirmed appointment prior to ending call.

## 2023-05-03 ENCOUNTER — Encounter (HOSPITAL_COMMUNITY): Payer: Self-pay

## 2023-05-03 ENCOUNTER — Ambulatory Visit (HOSPITAL_COMMUNITY)
Admission: RE | Admit: 2023-05-03 | Discharge: 2023-05-03 | Disposition: A | Discharge status: Home / Self Care | Payer: Medicaid Other | Source: Ambulatory Visit | Attending: Family Medicine | Admitting: Family Medicine

## 2023-05-03 VITALS — BP 126/98 | HR 84 | Wt 357.0 lb

## 2023-05-03 DIAGNOSIS — I5022 Chronic systolic (congestive) heart failure: Secondary | ICD-10-CM | POA: Insufficient documentation

## 2023-05-03 DIAGNOSIS — Z7984 Long term (current) use of oral hypoglycemic drugs: Secondary | ICD-10-CM | POA: Insufficient documentation

## 2023-05-03 DIAGNOSIS — I3139 Other pericardial effusion (noninflammatory): Secondary | ICD-10-CM | POA: Diagnosis not present

## 2023-05-03 DIAGNOSIS — Z6841 Body Mass Index (BMI) 40.0 and over, adult: Secondary | ICD-10-CM | POA: Diagnosis not present

## 2023-05-03 DIAGNOSIS — I513 Intracardiac thrombosis, not elsewhere classified: Secondary | ICD-10-CM | POA: Diagnosis not present

## 2023-05-03 DIAGNOSIS — D751 Secondary polycythemia: Secondary | ICD-10-CM | POA: Insufficient documentation

## 2023-05-03 DIAGNOSIS — Z7901 Long term (current) use of anticoagulants: Secondary | ICD-10-CM | POA: Insufficient documentation

## 2023-05-03 DIAGNOSIS — E785 Hyperlipidemia, unspecified: Secondary | ICD-10-CM | POA: Insufficient documentation

## 2023-05-03 DIAGNOSIS — Z79899 Other long term (current) drug therapy: Secondary | ICD-10-CM | POA: Diagnosis not present

## 2023-05-03 DIAGNOSIS — Z86718 Personal history of other venous thrombosis and embolism: Secondary | ICD-10-CM | POA: Insufficient documentation

## 2023-05-03 DIAGNOSIS — I441 Atrioventricular block, second degree: Secondary | ICD-10-CM | POA: Insufficient documentation

## 2023-05-03 DIAGNOSIS — Z86711 Personal history of pulmonary embolism: Secondary | ICD-10-CM | POA: Insufficient documentation

## 2023-05-03 DIAGNOSIS — Z8673 Personal history of transient ischemic attack (TIA), and cerebral infarction without residual deficits: Secondary | ICD-10-CM | POA: Insufficient documentation

## 2023-05-03 DIAGNOSIS — I5082 Biventricular heart failure: Secondary | ICD-10-CM | POA: Insufficient documentation

## 2023-05-03 DIAGNOSIS — I110 Hypertensive heart disease with heart failure: Secondary | ICD-10-CM | POA: Insufficient documentation

## 2023-05-03 DIAGNOSIS — Z95810 Presence of automatic (implantable) cardiac defibrillator: Secondary | ICD-10-CM | POA: Insufficient documentation

## 2023-05-03 DIAGNOSIS — E119 Type 2 diabetes mellitus without complications: Secondary | ICD-10-CM | POA: Diagnosis not present

## 2023-05-03 LAB — BASIC METABOLIC PANEL
Anion gap: 10 (ref 5–15)
BUN: 12 mg/dL (ref 6–20)
CO2: 26 mmol/L (ref 22–32)
Calcium: 9.2 mg/dL (ref 8.9–10.3)
Chloride: 100 mmol/L (ref 98–111)
Creatinine, Ser: 1.04 mg/dL (ref 0.61–1.24)
GFR, Estimated: >60 mL/min (ref >60)
Glucose, Bld: 126 mg/dL — ABNORMAL HIGH (ref 70–99)
Potassium: 3.4 mmol/L — ABNORMAL LOW (ref 3.5–5.1)
Sodium: 136 mmol/L (ref 135–145)

## 2023-05-03 LAB — CBC
HCT: 46.7 % (ref 39.0–52.0)
Hemoglobin: 15.8 g/dL (ref 13.0–17.0)
MCH: 30 pg (ref 26.0–34.0)
MCHC: 33.8 g/dL (ref 30.0–36.0)
MCV: 88.6 fL (ref 80.0–100.0)
Platelets: 388 10*3/uL (ref 150–400)
RBC: 5.27 MIL/uL (ref 4.22–5.81)
RDW: 11.9 % (ref 11.5–15.5)
WBC: 6.2 10*3/uL (ref 4.0–10.5)
nRBC: 0 % (ref 0.0–0.2)

## 2023-05-03 LAB — DIGOXIN LEVEL: Digoxin Level: <0.2 ng/mL — ABNORMAL LOW (ref 0.8–2.0)

## 2023-05-03 MED ORDER — SPIRONOLACTONE 25 MG PO TABS: 25.0000 mg | ORAL_TABLET | Freq: Every day | ORAL | 3 refills | Status: DC

## 2023-05-03 MED ORDER — TORSEMIDE 20 MG PO TABS: ORAL_TABLET | ORAL | 8 refills | Status: DC

## 2023-05-03 MED ORDER — APIXABAN 5 MG PO TABS: 5.0000 mg | ORAL_TABLET | Freq: Two times a day (BID) | ORAL | 0 refills | Status: DC

## 2023-05-03 MED ORDER — DAPAGLIFLOZIN PROPANEDIOL 10 MG PO TABS: 10.0000 mg | ORAL_TABLET | Freq: Every day | ORAL | 3 refills | Status: DC

## 2023-05-03 MED ORDER — DIGOXIN 125 MCG PO TABS: 125.0000 ug | ORAL_TABLET | Freq: Every day | ORAL | 0 refills | Status: DC

## 2023-05-03 NOTE — Patient Instructions (Addendum)
Thank you for coming in today  Labs were done today, if any labs are abnormal the clinic will call you No news is good news  Medications: Increase Spironolactone to 25 mg 1 tablet daily STOP daily Metolazone  STOP Lasix  START Torsemide 60 mg alternating with 40 mg every other day    Follow up appointments: Your physician recommends that you return for lab work in: 1 week bmet you were given a prescription to take to the [facility] in [place]   Your physician recommends that you schedule a follow-up appointment in:  3 months with With Dr. Gala Romney    Do the following things EVERYDAY: Weigh yourself in the morning before breakfast. Write it down and keep it in a log. Take your medicines as prescribed Eat low salt foods--Limit salt (sodium) to 2000 mg per day.  Stay as active as you can everyday Limit all fluids for the day to less than 2 liters   At the [facility], you and your health needs are our priority. As part of our continuing mission to provide you with exceptional heart care, we have created designated Provider Care Teams. These Care Teams include your primary Cardiologist (physician) and Advanced Practice Providers (APPs- Physician Assistants and Nurse Practitioners) who all work together to provide you with the care you need, when you need it.   You may see any of the following providers on your designated Care Team at your next follow up: Dr Arvilla Meres Dr Marca Ancona Dr. Marcos Eke, NP Robbie Lis, [place] [facility] [place], [place] Brynda Peon, NP Karle Plumber, PharmD   Please be sure to bring in all your medications bottles to every appointment.    Thank you for choosing [facility]  If you have any questions or concerns before your next appointment please send [place] a message through [place] or call our office at [phone].    TO LEAVE A MESSAGE FOR THE NURSE  SELECT OPTION 2, PLEASE LEAVE A MESSAGE INCLUDING: YOUR NAME DATE OF BIRTH CALL BACK NUMBER REASON FOR CALL**this is important as we prioritize the call backs  YOU WILL RECEIVE A CALL BACK THE SAME DAY AS LONG AS YOU CALL BEFORE 4:00 PM

## 2023-05-03 NOTE — Progress Notes (Signed)
Advanced Heart Failure Clinic Note  Primary Care: Mick Sell, MD General Cardiologist: Dr. Azucena Cecil HF Cardiologist: Dr. Gala Romney  HPI: Robert Mccann is a 27 y.o. 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  Admitted 6/22 to [facility] with PE. Underwent thrombectomy/thrombolysis of the right upper/middle/lower lobes. Dopplers negative for DVT though it was noted that the patient previously spent 12 hours each way to [place] and [place] and back about a week or 2 prior to admission. Echo showed EF of less than 20% with global hypokinesis and severely reduced RV function. 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 Noted to have intermittent Mobitz 2 heart block during periods of sleep with recommendation for outpatient sleep study. Started on GDMT and discharged home.    Admitted 10/2021 with dysarthria and left hand numbness/weakness. Found to have a large acute posterior right MCA territory infarct. TEE did not show any LA/LAA/LV thrombus, and bubble study was negative.  He reported compliance with Eliquis, which was held in the setting of areas of petechial hemorrhage on follow-up CT.  We discussed potentially switching to warfarin however, patient preferred to remain on Eliquis, and this was resumed once cleared by neurology.  Unable to complete coronary CTA due to inability to maintain adequate heart rate.   Admitted to [facility] 3/23 with CP and SOB. CT chest with small LUL PE, RA clot. Echo EF <20% severe RV dysfunction large LV clot. Transferred to [facility] for further management. No R/LHC with RA and LV clot. PICC placed, milrinone and lasix started. cMRI with LVEF 11%, RVEF 19%, large LV thrombus, criteria for LV non-compaction met, subendocardial LGE which can be seen in CAD and noncompaction, moderate pericardial effusion, moderate TR, mild MR.  Milrinone weaned and GDMT titrated, no beta blocker with wenckebach AV block. Not currently candidate for advanced therapies due to size and noncompliance. Discharged home, weight 309 lbs.  Follow up 4/23, Echo showed EF <20% moderate RV dysfunction large LV clot. Entresto increased and sleep study arranged.  Admitted 5/23 with rectal bleeding. Eliquis held. GI consulted, underwent colonoscopy showing significant bleeding from anal fissure. Had hemorrhoids, diverticulosis and 1 polyp that was resected and clipped. Eliquis resumed before discharge. CBC noted with polycythemia, hgb 19.6, he was referred to hematology at discharge. GDMT remained unchanged.  Follow up 06/21/22 with Dr. Azucena Cecil, patient self increased Lasix to 120 mg daily as he felt he was not urinating as much on 80 daily.  Echo 8/23: EF 25-30% RV ok   Echo 12/23 EF 25-30%, LV with GHK, normal RV, LA mildly dilated, no MR   S/p boston scientific dual chamber ICD 02/25/22 by Dr. Lalla Brothers.  Presented to the ED 3/24 after he heard a pop after reaching up to his ceiling fan (s/p ICD). Imaging reviewed, leads stable. Discharged home same day.   Today he returns for AHF follow up with his fiance. Overall feeling good. Denies palpitations, CP, dizziness, edema, or PND/Orthopnea. No SOB. Appetite ok. No fever or chills. Weight at home 330-350 pounds. Taking all medications, fiance helps set up his medicine box.  Started going to the gym and walking on the treadmill at an incline. Has been using lasix at different doses and gotten different responses.   Cardiac Studies: - Echo 12/23 EF 25-30%, LV with GHK, normal RV, LA mildly dilated, no MR  - Echo 8/23: EF 25-30% RV ok  -  Sleep study 8/23 AHI 3.4  - Echo (4/23): EF < 20%, moderate RV dysfunction with large LV clot. - cMRI (3/23): with LVEF 11%, RVEF 19%, large LV thrombus, criteria for LV non-compaction met, subendocardial LGE which can be seen in CAD and noncompaction, moderate  pericardial effusion, moderate TR, mild MR.  - Echo (3/23): EF <20% severe RV dysfunction large LV clot. - TEE (11/22): EF 20-25%, global HK LV, severe RV dysfunction, no clot, bubble study negative. - Echo (6/22): EF < 20% with global hypokinesis and severely reduced RV function.  ROS: All systems reviewed and negative except as per HPI.   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 05/2021 noted during periods of sleep @ time of hospitalization for PE.   Cardiomyopathy (HCC)    a. 05/2021 Echo: EF <20%, glob HK. Rheems:C ratio of 2.1:1 suggesting non-compaction. Gr2 DD. Sev reduced RV fxn. Nl PASP. Mildly dil RA. Mild MR; b. 10/2021 Echo: EF <20%, GrII DD; c. 10/2021 TEE: EF 20-25%, glob HK, sev red RV fxn.   Diabetes mellitus without complication (HCC)    HFrEF (heart failure with reduced ejection fraction) (HCC)    a. 05/2021 Echo: EF <20%. ? non-compaction.   Hypertension    Pulmonary embolism (HCC)    a. 05/2021 CTA Chest: PE extending from lobar arteris of RUL and RLL w/ concern for pulml infarct-->s/p thrombolysis and thrombectomy of RUL/RML/RLL.   Stroke [facility])    a. 10/2021 CT Head: Bilat cerebral infarcts w/ mod-sided acute to subacute R temporoparietal infarct; b. 10/2021 MRI: R post PCA territory infarcts w/o hemorrhage; c. 10/2021 TEE: Neg bubble study. No LA/LAA thrombus. EF 20-25%, glob HK.   Current Outpatient Medications  Medication Sig Dispense Refill   apixaban (ELIQUIS) 5 MG TABS tablet Take 1 tablet (5 mg total) by mouth 2 (two) times daily. 180 tablet 0   Blood Glucose Monitoring Suppl (BLOOD GLUCOSE MONITOR SYSTEM) w/Device KIT Use up to four times daily as directed. 1 kit 3   digoxin (LANOXIN) 0.125 MG tablet Take 1 tablet (125 mcg total) by mouth daily. 30 tablet 0   ezetimibe (ZETIA) 10 MG tablet Take 1 tablet (10 mg total) by mouth daily. 90 tablet 3   FARXIGA 10 MG TABS tablet TAKE 1 TABLET (10 MG) BY MOUTH DAILY 90 tablet 0   furosemide (LASIX)  20 MG tablet Take 120 mg by mouth daily. Patient takes 120 mg(6) tablets by mouth daily. ALTERNATES WITH 40mg /3tabs     melatonin 5 MG TABS Take 5 mg by mouth at bedtime as needed.     metFORMIN (GLUCOPHAGE) 500 MG tablet Take 1 tablet (500 mg total) by mouth 2 (two) times daily with a meal. 180 tablet 1   metolazone (ZAROXOLYN) 2.5 MG tablet Take 2.5 mg by mouth daily.     metoprolol succinate (TOPROL XL) 25 MG 24 hr tablet Take 1 tablet (25 mg total) by mouth daily. 90 tablet 3   potassium chloride (KLOR-CON) 20 MEQ packet Take 60 mEq by mouth daily. (Patient taking differently: Take 40-60 mEq by mouth daily as needed (low potassium).) 90 packet 6   rosuvastatin (CRESTOR) 40 MG tablet Take 1 tablet (40 mg total) by mouth daily. 90 tablet 3   sacubitril-valsartan (ENTRESTO) 97-103 MG Take 1 tablet by mouth 2 (two) times daily. 180 tablet 3   Semaglutide, 2 MG/DOSE, (OZEMPIC, 2 MG/DOSE,) 8 MG/3ML SOPN Inject 2 mg into the skin once a  week. 3 mL 3   spironolactone (ALDACTONE) 25 MG tablet Take 1/2 (one-half) tablet by mouth once daily 45 tablet 0   No current facility-administered medications for this encounter.   No Known Allergies  Social History   Socioeconomic History   Marital status: Single    Spouse name: Not on file   Number of children: Not on file   Years of education: Not on file   Highest education level: Not on file  Occupational History   Not on file  Tobacco Use   Smoking status: Never   Smokeless tobacco: Never  Vaping Use   Vaping Use: Never used  Substance and Sexual Activity   Alcohol use: Not Currently    Comment: special occasions   Drug use: Yes    Types: Marijuana    Comment: last month   Sexual activity: Yes    Partners: Female  Other Topics Concern   Not on file  Social History Narrative   Smokes weed; No smoking cigarettes; no alcohol. 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 Lives in [place].  NO children.    Social Determinants of Health    Financial Resource Strain: Not on file  Food Insecurity: Not on file  Transportation Needs: Not on file  Physical Activity: Not on file  Stress: Not on file  Social Connections: Not on file  Intimate Partner Violence: Not on file   Family History  Problem Relation Age of Onset   Heart failure Mother    Cerebrovascular Accident Mother    Diabetes Father    Hypertension Father    Cancer Cousin        unknown origin   Cancer Cousin        unknown origing   BP (!) 126/98   Pulse 84   Wt (!) 161.9 kg (357 lb)   SpO2 97%   BMI 51.22 kg/m   Wt Readings from Last 3 Encounters:  05/03/23 (!) 161.9 kg (357 lb)  03/09/23 (!) 163.3 kg (360 lb)  02/25/23 (!) 158.8 kg (350 lb)   PHYSICAL EXAM: General:  well appearing.  No respiratory difficulty. Walked into clinic.  HEENT: normal Neck: supple. JVD difficult to see, thick neck however does not appear elevated. Carotids 2+ bilat; no bruits. No lymphadenopathy or thyromegaly appreciated. Cor: PMI nondisplaced. Regular rate & rhythm. No rubs, gallops or murmurs. Lungs: clear Abdomen: obese, soft, nontender, nondistended. No hepatosplenomegaly. No bruits or masses. Good bowel sounds. Extremities: no cyanosis, clubbing, rash, edema  Neuro: alert & oriented x 3, cranial nerves grossly intact. moves all 4 extremities w/o difficulty. Affect pleasant.   Device interrogation: HeartLogic score 0, activity level 0.8 hrs/day, mean HR 85, No AT/AF  ASSESSMENT & PLAN: 1. 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  - Echo EF < 20% with large LV clot RV several decreased - cMRI (3/23): LVEF 11%, RVEF 19%, large LV thrombus, criteria for LV non-compaction met, subendocardial LGE which can be seen in CAD and noncompaction, moderate pericardial effusion, moderate TR, mild MR - Echo (4/23) EF <20% moderate RV dysfunction large LV clot. - Echo 8/23: EF 25-30% RV ok  - Echo 12/23 EF 25-30%, LV with GHK, normal RV, LA mildly dilated, no MR   - Genetic testing with 2 genes of uncertain significant - S/p boston scientific dual chamber ICD 02/25/22 by Dr. Lalla Brothers. - Stable NYHA II. Volume looks ok. Weight stable.  - Volume status ok. Stop lasix as he has been alternating between random doses (  16,10,960,454) . Will try to simplify regimen.  - Start Torsemide 60 mg daily alternating with 40 QOD. Instructed to take an extra 20mg  if weight goes up. Will have device nurse send device interrogation in 1 week to follow volume status.  - Continue Entresto 97/103 mg bid. - Continue digoxin 0.125 mg daily. Check level today, has not had meds - Increase spironolactone 12.5> 25 mg daily. - Continue Farxiga 10 mg daily. - Stop daily metolazone, hadn't been taking for a while anyway's - Continue Toprol 25 QHS - Has been taking potassium packets PRN, has not taken any in a while. Check BMET today. Increase spiro as above. Repeat BMET 1 week.  - Coronary CT 8/23. No CAD  CAC = 0. - Not candidate for advanced therapies currently with size, RV dysfunction. Long discussion about need to get weight off. Continue SGLT2i. Watch diet. Needs more activity.  - BMET today   2. Large LV thrombus/RA thrombus - RA thrombus noted on CTA chest. - Large LV thrombus on echo and cMRI.in 4/23 - Continue Eliquis 5 mg bid. No bleeding issues. - CBC today   3. Pericardial effusion - Moderate in size on cMRI. - Trivial on echo 4/23.   4. History of PE s/p thrombectomy and thrombolysis with recurrent new PEs most recent admission - Continue Eliquis as above.   5. History of right posterior PCA territory infarcts (cardioembolic) - At high risk for recurrent events with large LV thrombus. - Continue Eliquis and statin.  6. Erythrocytosis - Followed by Heme/Onc.  7. HLD - Continue statin + Nexlizet. - LDL 39 (6/23).   8. DM2 - A1c 8.9% 3/23. - Continue SGLT2i and GLP1RA.   9. Morbid obesity - Body mass index is 51.22 kg/m. - Continue GLP1RA. - Eats  small meals during the day.  - Has started going to the gym.   Follow up in 3 months with Dr. Gala Romney.   Alen Bleacher, NP  9:29 AM

## 2023-05-04 IMAGING — CR DG CHEST 2V
2 series · 2 of 2 positions shown · non-contrast
Comparison: None.

CLINICAL DATA: Chest pain times 3-4 days.

EXAM:
CHEST - 2 VIEW

[chest pa]
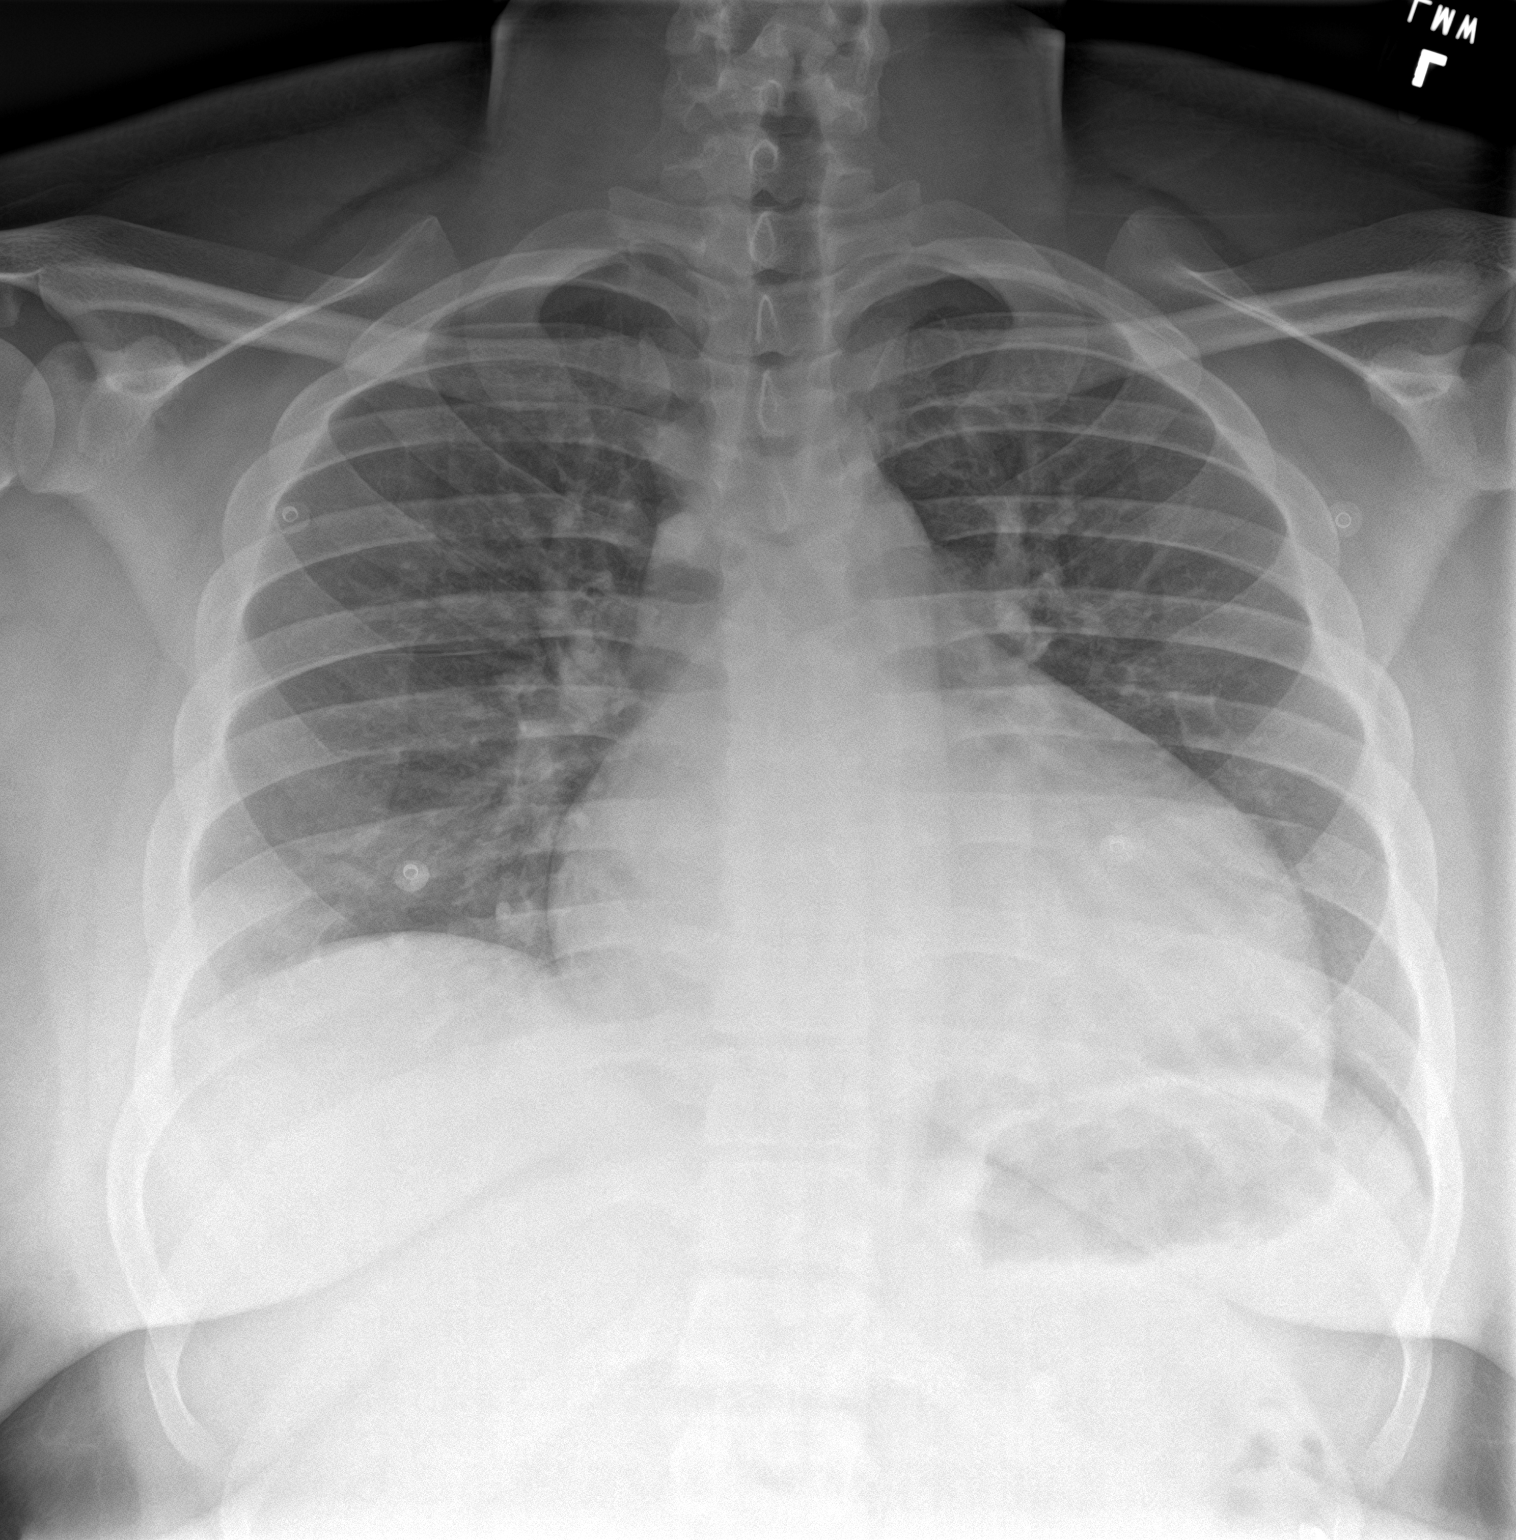

[chest lat]
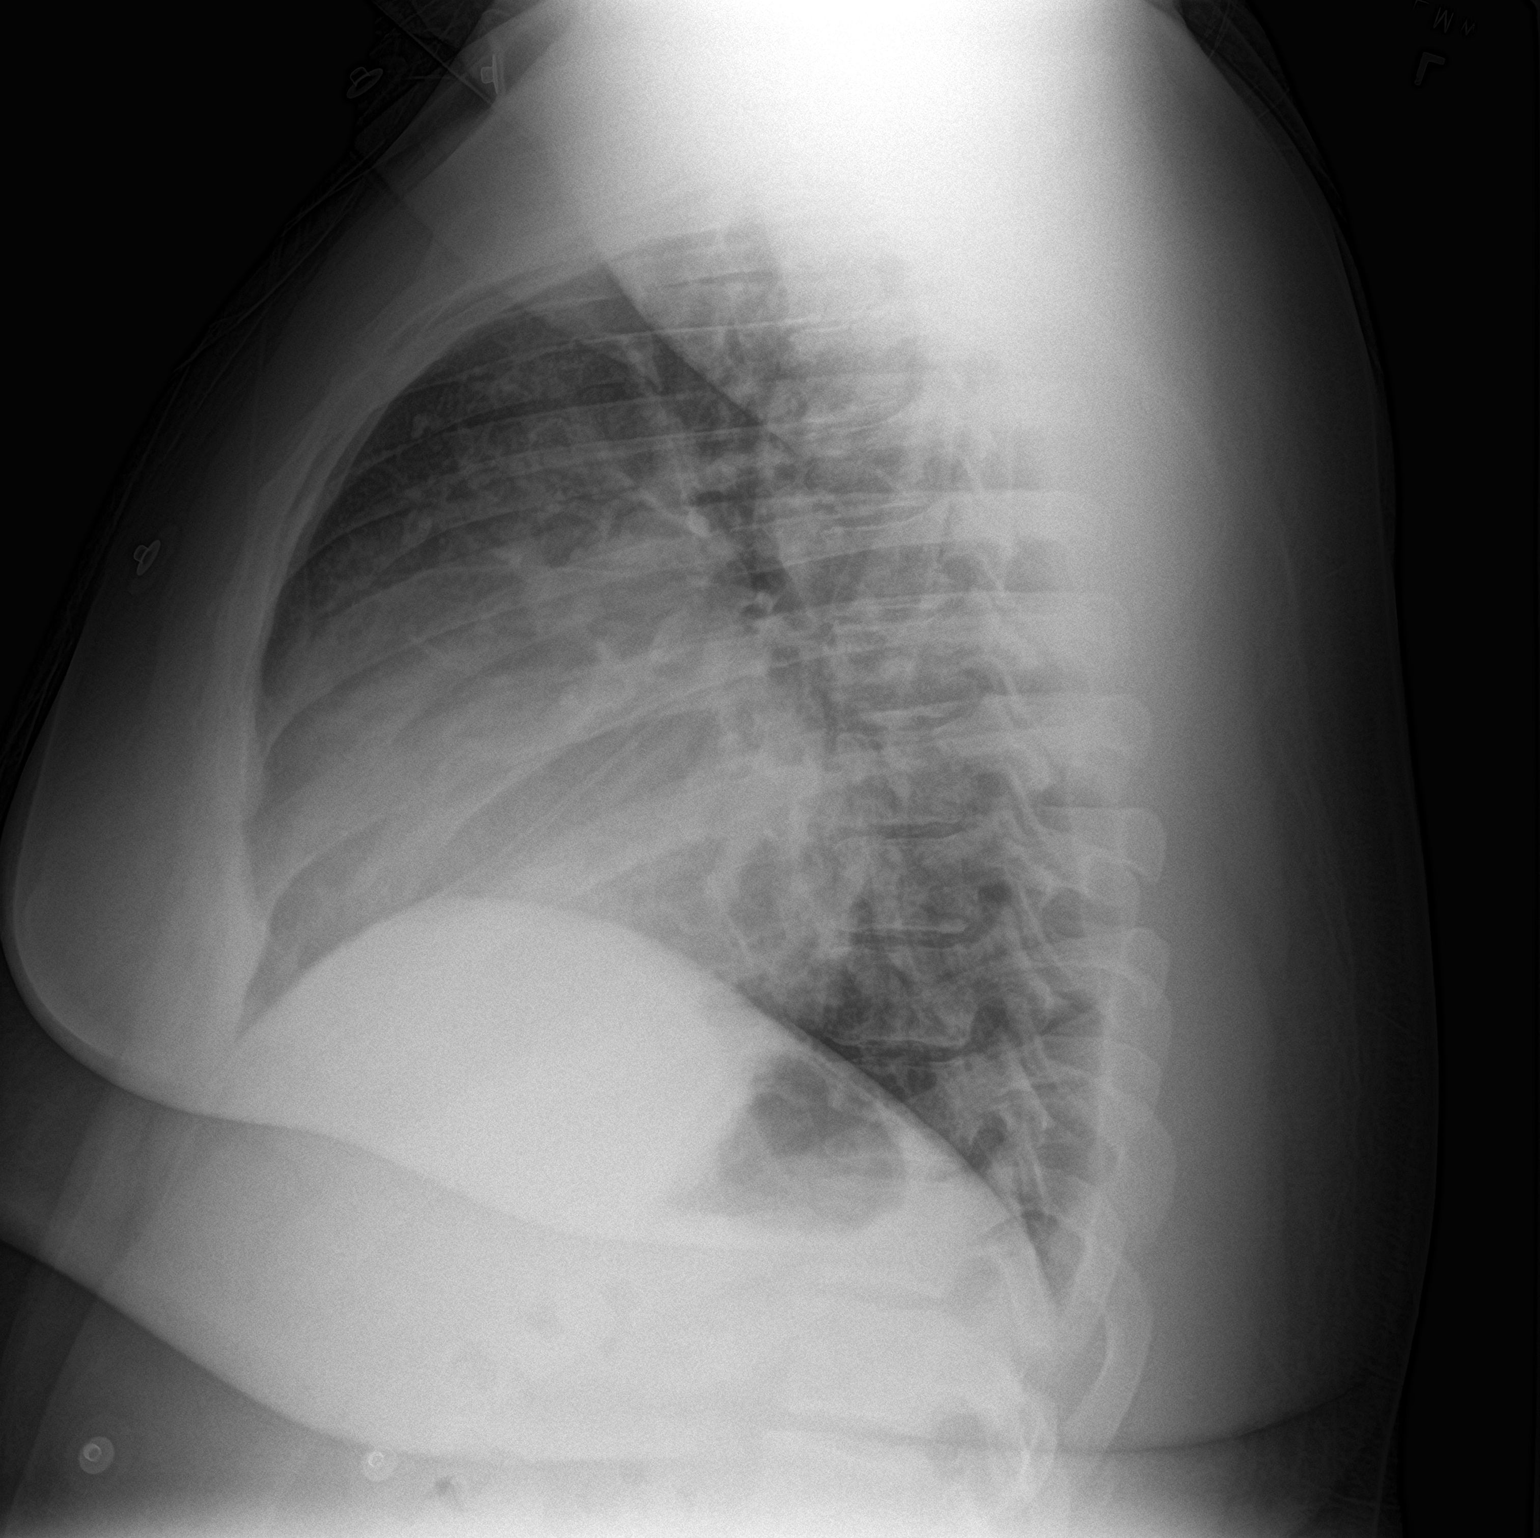

[2 of 2 positions shown; findings below may reference images not displayed]

FINDINGS: The cardiac silhouette is markedly enlarged. Both lungs are clear.
The visualized skeletal structures are unremarkable.
IMPRESSION: Cardiomegaly without an acute infiltrate. Sequelae associated with a
large pericardial effusion cannot be excluded. Correlation with
chest CT is recommended if this is of clinical concern.

## 2023-05-13 ENCOUNTER — Ambulatory Visit: Payer: BLUE CROSS/BLUE SHIELD | Attending: Cardiology

## 2023-05-13 DIAGNOSIS — Z95810 Presence of automatic (implantable) cardiac defibrillator: Secondary | ICD-10-CM | POA: Diagnosis not present

## 2023-05-13 DIAGNOSIS — I5022 Chronic systolic (congestive) heart failure: Secondary | ICD-10-CM

## 2023-05-13 NOTE — Progress Notes (Signed)
EPIC Encounter for ICM Monitoring  Patient Name: Robert Mccann is a 27 y.o. male Date: 05/13/2023 Primary Care Physican: Mick Sell, MD Primary Cardiologist: Bensimhon Electrophysiologist: Lalla Brothers Office Weight: 357 lbs      ICM check for HF clinic as requested following 5/3 OV.    Heart Failure questions reviewed, pt asymptomatic and doing well.    Confirmed he is taking Torsemide 60 mg every other day alternating with 40 mg every other day.    HeartLogic Heart Failure Index is 2 suggesting fluid accumulation which is within normal threshold range.  Prescribed: Torsemide 60 mg daily alternating with 40 QOD. Instructed to take an extra 20mg  if weight goes up.   Recommendations: Discussed diet.   Advised to limit salt intake to 2000 mg/day and fluid intake to < 2 liters/day.   Advised to continue with prescribed Torsemide dosage at this time.  No changes.   Encouraged to call for fluid symptoms.  Copy sent to Brynda Peon, NP   Follow-up plan: No further ICM clinic phone appointment.   91 day device clinic remote transmission 05/29/2023.             EP/Cardiology next office visit: 05/29/2023 with Dr. Lalla Brothers         Copy of ICM check sent to Dr. Lalla Brothers.    3 Month HeartLogicT Heart Failure Index:    8 Day Data Trend:          Karie Soda, RN 05/13/2023 4:50 PM

## 2023-05-23 LAB — HEMOGLOBIN A1C: Hemoglobin A1c: 7.2 — AB

## 2023-05-28 NOTE — Progress Notes (Unsigned)
  Electrophysiology Office Follow up Visit Note:    Date:  05/29/2023   ID:  Robert Mccann, DOB 01-12-1996, MRN [id number]  PCP:  Mick Sell, MD  [facility] Cardiologist:  Debbe Odea, MD  [facility] Electrophysiologist:  Lanier Prude, MD    Interval History:    Robert Mccann is a 27 y.o. male who presents for a follow up visit.   He had an ICD implanted 02/25/2023 for his history of chronic systolic heart failure.  He has done well since implant.      Past medical, surgical, social and family history were reviewed.  ROS:   Please see the history of present illness.    All other systems reviewed and are negative.  EKGs/Labs/Other Studies Reviewed:    The following studies were reviewed today:  05/29/2023 in clinic device interrogation personally reviewed. Battery longevity 13 years Lead parameter stable No high-voltage therapies     Physical Exam:    VS:  BP 126/80   Pulse 88   Ht 5\' 10"  (1.778 m)   Wt (!) 346 lb (156.9 kg)   SpO2 98%   BMI 49.65 kg/m     Wt Readings from Last 3 Encounters:  05/29/23 (!) 346 lb (156.9 kg)  05/03/23 (!) 357 lb (161.9 kg)  03/09/23 (!) 360 lb (163.3 kg)     GEN:  Well nourished, well developed in no acute distress CARDIAC: RRR, no murmurs, rubs, gallops. ICD pocket well healed. RESPIRATORY:  Clear to auscultation without rales, wheezing or rhonchi       ASSESSMENT:    1. Chronic systolic congestive heart failure (HCC)   2. Implantable cardioverter-defibrillator (ICD) in situ   3. Morbid obesity (HCC)    PLAN:    In order of problems listed above:  #Chronic systolic heart failure NYHA II. Warm and dry on exam. Continue w GDMT.  Base heart rate elevated. Increase metoprolol to 50 mg by mouth twice daily.  We did discuss the side effects of beta-blockers given his concerns about erectile dysfunction.  If we encountered difficulties, may need to consider alternatives such as ?  Ivabridine.  #ICD in situ Device functioning appropriately. Continue remote monitoring.  Follow up 1 year with EP APP.   Signed, Steffanie Dunn, MD, [facility], [facility] 05/29/2023 1:57 PM    Electrophysiology [facility]

## 2023-05-29 ENCOUNTER — Ambulatory Visit (INDEPENDENT_AMBULATORY_CARE_PROVIDER_SITE_OTHER): Payer: BLUE CROSS/BLUE SHIELD

## 2023-05-29 ENCOUNTER — Encounter: Payer: Self-pay | Admitting: Cardiology

## 2023-05-29 ENCOUNTER — Ambulatory Visit: Payer: Medicaid Other | Attending: Cardiology | Admitting: Cardiology

## 2023-05-29 ENCOUNTER — Other Ambulatory Visit
Admission: RE | Admit: 2023-05-29 | Discharge: 2023-05-29 | Disposition: A | Discharge status: Home / Self Care | Payer: BLUE CROSS/BLUE SHIELD | Source: Ambulatory Visit | Attending: Cardiology | Admitting: Cardiology

## 2023-05-29 VITALS — BP 126/80 | HR 88 | Ht 70.0 in | Wt 346.0 lb

## 2023-05-29 DIAGNOSIS — I5022 Chronic systolic (congestive) heart failure: Secondary | ICD-10-CM

## 2023-05-29 DIAGNOSIS — I429 Cardiomyopathy, unspecified: Secondary | ICD-10-CM

## 2023-05-29 DIAGNOSIS — Z95810 Presence of automatic (implantable) cardiac defibrillator: Secondary | ICD-10-CM

## 2023-05-29 LAB — CUP PACEART INCLINIC DEVICE CHECK
Date Time Interrogation Session: 20240529124708
HighPow Impedance: 77 Ohm
Implantable Lead Connection Status: 753985
Implantable Lead Connection Status: 753985
Implantable Lead Implant Date: 20240227
Implantable Lead Implant Date: 20240227
Implantable Lead Location: 753859
Implantable Lead Location: 753860
Implantable Lead Model: 673
Implantable Lead Model: 7841
Implantable Lead Serial Number: 1407551
Implantable Lead Serial Number: 209112
Implantable Pulse Generator Implant Date: 20240227
Lead Channel Impedance Value: 447 Ohm
Lead Channel Impedance Value: 763 Ohm
Lead Channel Pacing Threshold Amplitude: 0.5 V
Lead Channel Pacing Threshold Amplitude: 1.1 V
Lead Channel Pacing Threshold Pulse Width: 0.4 ms
Lead Channel Pacing Threshold Pulse Width: 0.4 ms
Lead Channel Sensing Intrinsic Amplitude: 25 mV
Lead Channel Sensing Intrinsic Amplitude: 8 mV
Lead Channel Setting Pacing Amplitude: 2 V
Lead Channel Setting Pacing Amplitude: 2.5 V
Lead Channel Setting Pacing Pulse Width: 0.4 ms
Lead Channel Setting Sensing Sensitivity: 0.5 mV
Pulse Gen Serial Number: 652491
Zone Setting Status: 755011

## 2023-05-29 LAB — BASIC METABOLIC PANEL
Anion gap: 10 (ref 5–15)
BUN: 14 mg/dL (ref 6–20)
CO2: 28 mmol/L (ref 22–32)
Calcium: 9.1 mg/dL (ref 8.9–10.3)
Chloride: 95 mmol/L — ABNORMAL LOW (ref 98–111)
Creatinine, Ser: 1.17 mg/dL (ref 0.61–1.24)
GFR, Estimated: >60 mL/min (ref >60)
Glucose, Bld: 152 mg/dL — ABNORMAL HIGH (ref 70–99)
Potassium: 3.1 mmol/L — ABNORMAL LOW (ref 3.5–5.1)
Sodium: 133 mmol/L — ABNORMAL LOW (ref 135–145)

## 2023-05-29 LAB — MAGNESIUM: Magnesium: 2 mg/dL (ref 1.7–2.4)

## 2023-05-29 MED ORDER — METOPROLOL SUCCINATE ER 50 MG PO TB24: 50.0000 mg | ORAL_TABLET | Freq: Two times a day (BID) | ORAL | 3 refills | Status: DC

## 2023-05-29 NOTE — Patient Instructions (Addendum)
Medication Instructions:  Your physician has recommended you make the following change in your medication:  1) INCREASE metoprolol to 50 mg twice daily   *If you need a refill on your cardiac medications before your next appointment, please call your pharmacy*  Lab Work: TODAY: BMET and Mag You will get your lab work at [facility]) hospital.  Your lab work will be done at [facility] next to [employer].  These are walk in labs- you will not need an appointment and you do not need to be fasting.     Follow-Up: At [facility], you and your health needs are our priority.  As part of our continuing mission to provide you with exceptional heart care, we have created designated Provider Care Teams.  These Care Teams include your primary Cardiologist (physician) and Advanced Practice Providers (APPs -  Physician Assistants and Nurse Practitioners) who all work together to provide you with the care you need, when you need it.  Your next appointment:   1 year  Provider:   Sherie Don, NP

## 2023-05-30 LAB — CUP PACEART REMOTE DEVICE CHECK
Battery Remaining Longevity: 156 mo
Battery Remaining Percentage: 100 %
Brady Statistic RA Percent Paced: 0 %
Brady Statistic RV Percent Paced: 0 %
Date Time Interrogation Session: 20240529025000
HighPow Impedance: 66 Ohm
Implantable Lead Connection Status: 753985
Implantable Lead Connection Status: 753985
Implantable Lead Implant Date: 20240227
Implantable Lead Implant Date: 20240227
Implantable Lead Location: 753859
Implantable Lead Location: 753860
Implantable Lead Model: 673
Implantable Lead Model: 7841
Implantable Lead Serial Number: 1407551
Implantable Lead Serial Number: 209112
Implantable Pulse Generator Implant Date: 20240227
Lead Channel Impedance Value: 387 Ohm
Lead Channel Impedance Value: 651 Ohm
Lead Channel Pacing Threshold Amplitude: 0.4 V
Lead Channel Pacing Threshold Amplitude: 1.2 V
Lead Channel Pacing Threshold Pulse Width: 0.4 ms
Lead Channel Pacing Threshold Pulse Width: 0.4 ms
Lead Channel Setting Pacing Amplitude: 3.5 V
Lead Channel Setting Pacing Amplitude: 3.5 V
Lead Channel Setting Pacing Pulse Width: 0.4 ms
Lead Channel Setting Sensing Sensitivity: 0.5 mV
Pulse Gen Serial Number: 652491
Zone Setting Status: 755011

## 2023-06-03 ENCOUNTER — Telehealth: Payer: Self-pay | Admitting: Cardiology

## 2023-06-03 DIAGNOSIS — I5022 Chronic systolic (congestive) heart failure: Secondary | ICD-10-CM

## 2023-06-03 DIAGNOSIS — Z95810 Presence of automatic (implantable) cardiac defibrillator: Secondary | ICD-10-CM

## 2023-06-03 MED ORDER — POTASSIUM CHLORIDE 20 MEQ PO PACK: 60.0000 meq | PACK | Freq: Every day | ORAL | 6 refills | Status: DC

## 2023-06-03 NOTE — Telephone Encounter (Signed)
-----   Message from Lanier Prude, MD sent at 06/02/2023 10:33 AM EDT ----- The potassium is low as we expected.  Carly, can you call him to confirm what he has been taking. I don't think he has been taking daily. If not, can you have him take Potassium twice daily for 2 days then decrease to once daily. He needs repeat BMP in 1 week. Thanks, Sheria Lang

## 2023-06-03 NOTE — Telephone Encounter (Signed)
The patient has been notified of the result and verbalized understanding.  All questions (if any) were answered. Frutoso Schatz, RN 06/03/2023 11:49 AM  Patient has not been taking potassium daily, he only takes as needed.  I have sent patient in a refill of potassium and he will take twice daily for the next two days then begin taking 60 meq daily. Repeat labs have been ordered.

## 2023-06-03 NOTE — Telephone Encounter (Signed)
Patient is returning call to discuss lab results. 

## 2023-06-06 ENCOUNTER — Ambulatory Visit: Payer: BLUE CROSS/BLUE SHIELD | Admitting: Dietician

## 2023-06-13 ENCOUNTER — Ambulatory Visit: Payer: BLUE CROSS/BLUE SHIELD | Admitting: Dietician

## 2023-06-20 NOTE — Progress Notes (Signed)
Remote ICD transmission.   

## 2023-08-07 ENCOUNTER — Other Ambulatory Visit (HOSPITAL_COMMUNITY): Payer: Self-pay

## 2023-08-07 DIAGNOSIS — Z95810 Presence of automatic (implantable) cardiac defibrillator: Secondary | ICD-10-CM

## 2023-08-07 DIAGNOSIS — I5022 Chronic systolic (congestive) heart failure: Secondary | ICD-10-CM

## 2023-08-07 MED ORDER — DAPAGLIFLOZIN PROPANEDIOL 10 MG PO TABS: 10.0000 mg | ORAL_TABLET | Freq: Every day | ORAL | 3 refills | Status: DC

## 2023-08-07 MED ORDER — DIGOXIN 125 MCG PO TABS
125.0000 ug | ORAL_TABLET | Freq: Every day | ORAL | 1 refills | Status: DC
  Filled 2024-01-17 – 2024-01-21 (×4): qty 90, 90d supply, fill #0

## 2023-08-07 MED ORDER — APIXABAN 5 MG PO TABS: 5.0000 mg | ORAL_TABLET | Freq: Two times a day (BID) | ORAL | 3 refills | Status: DC

## 2023-08-07 MED ORDER — SPIRONOLACTONE 25 MG PO TABS: 25.0000 mg | ORAL_TABLET | Freq: Every day | ORAL | 3 refills | Status: DC

## 2023-08-07 MED ORDER — POTASSIUM CHLORIDE 20 MEQ PO PACK
60.0000 meq | PACK | Freq: Every day | ORAL | 1 refills | Status: DC
  Filled 2024-01-20: qty 90, 30d supply, fill #0
  Filled 2024-03-13: qty 90, 30d supply, fill #1

## 2023-08-07 MED ORDER — ENTRESTO 97-103 MG PO TABS
1.0000 | ORAL_TABLET | Freq: Two times a day (BID) | ORAL | 1 refills | Status: DC
  Filled 2024-01-20: qty 180, 90d supply, fill #0
  Filled 2024-04-30: qty 180, 90d supply, fill #1

## 2023-08-07 MED ORDER — TORSEMIDE 20 MG PO TABS: ORAL_TABLET | ORAL | 3 refills | Status: DC

## 2023-08-07 MED ORDER — EZETIMIBE 10 MG PO TABS
10.0000 mg | ORAL_TABLET | Freq: Every day | ORAL | 2 refills | Status: DC
  Filled 2024-01-20 – 2024-06-02 (×2): qty 90, 90d supply, fill #0
  Filled 2024-07-23: qty 90, 90d supply, fill #1

## 2023-08-07 MED ORDER — METOPROLOL SUCCINATE ER 50 MG PO TB24: 50.0000 mg | ORAL_TABLET | Freq: Two times a day (BID) | ORAL | 3 refills | Status: DC

## 2023-08-07 MED ORDER — ROSUVASTATIN CALCIUM 40 MG PO TABS
40.0000 mg | ORAL_TABLET | Freq: Every day | ORAL | 1 refills | Status: DC
  Filled 2024-01-20: qty 90, 90d supply, fill #0
  Filled 2024-04-30: qty 90, 90d supply, fill #1

## 2023-08-07 NOTE — Telephone Encounter (Signed)
Meds ordered this encounter  Medications   apixaban (ELIQUIS) 5 MG TABS tablet    Sig: Take 1 tablet (5 mg total) by mouth 2 (two) times daily.    Dispense:  180 tablet    Refill:  3   dapagliflozin propanediol (FARXIGA) 10 MG TABS tablet    Sig: Take 1 tablet (10 mg total) by mouth daily.    Dispense:  90 tablet    Refill:  3   digoxin (LANOXIN) 0.125 MG tablet    Sig: Take 1 tablet (125 mcg total) by mouth daily.    Dispense:  90 tablet    Refill:  3   ezetimibe (ZETIA) 10 MG tablet    Sig: Take 1 tablet (10 mg total) by mouth daily.    Dispense:  90 tablet    Refill:  3    Replacing Nexlizet   metoprolol succinate (TOPROL-XL) 50 MG 24 hr tablet    Sig: Take 1 tablet (50 mg total) by mouth in the morning and at bedtime. Take with or immediately following a meal.    Dispense:  180 tablet    Refill:  3   potassium chloride (KLOR-CON) 20 MEQ packet    Sig: Take 60 mEq by mouth daily.    Dispense:  90 packet    Refill:  3   rosuvastatin (CRESTOR) 40 MG tablet    Sig: Take 1 tablet (40 mg total) by mouth daily.    Dispense:  90 tablet    Refill:  3   sacubitril-valsartan (ENTRESTO) 97-103 MG    Sig: Take 1 tablet by mouth 2 (two) times daily.    Dispense:  180 tablet    Refill:  3   spironolactone (ALDACTONE) 25 MG tablet    Sig: Take 1 tablet (25 mg total) by mouth daily.    Dispense:  90 tablet    Refill:  3   torsemide (DEMADEX) 20 MG tablet    Sig: Alternating 60 mg every other day with 40 mg every other day    Dispense:  140 tablet    Refill:  3

## 2023-08-08 ENCOUNTER — Other Ambulatory Visit: Payer: Self-pay | Admitting: Cardiology

## 2023-08-09 ENCOUNTER — Ambulatory Visit
Admission: EM | Admit: 2023-08-09 | Discharge: 2023-08-09 | Disposition: A | Discharge status: Home / Self Care | Payer: BLUE CROSS/BLUE SHIELD | Attending: Emergency Medicine | Admitting: Emergency Medicine

## 2023-08-09 ENCOUNTER — Encounter: Payer: Self-pay | Admitting: Emergency Medicine

## 2023-08-09 DIAGNOSIS — H6123 Impacted cerumen, bilateral: Secondary | ICD-10-CM | POA: Diagnosis not present

## 2023-08-09 NOTE — Discharge Instructions (Addendum)
Use Debrox once or twice a week before your shower to help emulsify earwax.  Use the shower water to help wash out your ears and prevent wax from accumulating.  If you have a return of symptoms please return for reevaluation.

## 2023-08-09 NOTE — ED Provider Notes (Signed)
[facility]    CSN: [id number] Arrival date & time: 08/09/23  1412      History   Chief Complaint Chief Complaint  Patient presents with   Otalgia    left    HPI Robert Mccann is a 27 y.o. male.   HPI  28 year old male with past medical history significant for diabetes, cardiomyopathy, AV block Mobitz 2, stroke, PE, heart failure with reduced ejection fraction presents for evaluation of 3 days worth of pain in the left ear.  He started having pain in his right ear yesterday.  He reports that his hearing is muffled and he has had some ringing in both ears.  He denies any drainage from his ears or dizziness.  He does have a history of cerumen impaction and did try applying hydrogen peroxide to his ear yesterday without improvement of symptoms.  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 05/2021 noted during periods of sleep @ time of hospitalization for PE.   Cardiomyopathy (HCC)    a. 05/2021 Echo: EF <20%, glob HK. [place]:C ratio of 2.1:1 suggesting non-compaction. Gr2 DD. Sev reduced RV fxn. Nl PASP. Mildly dil RA. Mild MR; b. 10/2021 Echo: EF <20%, GrII DD; c. 10/2021 TEE: EF 20-25%, glob HK, sev red RV fxn.   Diabetes mellitus without complication (HCC)    HFrEF (heart failure with reduced ejection fraction) (HCC)    a. 05/2021 Echo: EF <20%. ? non-compaction.   Hypertension    Pulmonary embolism (HCC)    a. 05/2021 CTA Chest: PE extending from lobar arteris of RUL and RLL w/ concern for pulml infarct-->s/p thrombolysis and thrombectomy of RUL/RML/RLL.   Stroke [facility])    a. 10/2021 CT Head: Bilat cerebral infarcts w/ mod-sided acute to subacute R temporoparietal infarct; b. 10/2021 MRI: R post PCA territory infarcts w/o hemorrhage; c. 10/2021 TEE: Neg bubble study. No LA/LAA thrombus. EF 20-25%, glob HK.    Patient Active Problem List   Diagnosis Date Noted   ICD (implantable cardioverter-defibrillator) in place 02/25/2023   Erythrocytosis  05/30/2022   Lower GI bleed 05/17/2022   Rectal bleeding    Cardiomyopathy (HCC)    Chronic congestive heart failure (HCC)    Essential hypertension 03/21/2022   AKI (acute kidney injury) (HCC) 03/21/2022   Type 2 diabetes mellitus with hyperlipidemia (HCC) 03/21/2022   Shock liver 03/21/2022   Class 3 obesity (HCC) 03/21/2022   Iron deficiency 03/21/2022   Acute on chronic systolic (congestive) heart failure (HCC) 03/16/2022   Acute on chronic HFrEF (heart failure with reduced ejection fraction) (HCC)    LV (left ventricular) mural thrombus    NSTEMI (non-ST elevated myocardial infarction) (HCC) 03/13/2022   Diabetes mellitus type 2, noninsulin dependent (HCC) 03/13/2022   Hyperlipidemia 03/13/2022   Acute pulmonary embolism (HCC) 03/13/2022   Cerebrovascular accident (CVA) (HCC) 11/05/2021   PE (pulmonary thromboembolism) (HCC) 06/28/2021   Acute pulmonary embolism with acute cor pulmonale (HCC)    Obesity, Class III, BMI 40-49.9 (morbid obesity) (HCC)    DM II (diabetes mellitus, type II), controlled (HCC) 06/27/2021   Chest pain 06/27/2021   HTN (hypertension) 06/27/2021   Malaise 05/25/2014   Routine history and physical examination of adult 05/25/2014   Obesity 05/25/2014    Past Surgical History:  Procedure Laterality Date   COLONOSCOPY N/A 05/18/2022   Procedure: COLONOSCOPY;  Surgeon: [name], MD;  Location: [facility] ENDOSCOPY;  Service: Gastroenterology;  Laterality: N/A;  HAND SURGERY Left age 54   HAND SURGERY     HEMOSTASIS CLIP PLACEMENT  05/18/2022   Procedure: HEMOSTASIS CLIP PLACEMENT;  Surgeon: [name], MD;  Location: [facility] ENDOSCOPY;  Service: Gastroenterology;;   ICD IMPLANT N/A 02/25/2023   Procedure: ICD IMPLANT;  Surgeon: Lanier Prude, MD;  Location: [facility] INVASIVE CV LAB;  Service: Cardiovascular;  Laterality: N/A;   POLYPECTOMY  05/18/2022   Procedure: POLYPECTOMY;  Surgeon: [name], MD;  Location: [facility] ENDOSCOPY;  Service:  Gastroenterology;;   PULMONARY THROMBECTOMY N/A 06/28/2021   Procedure: PULMONARY THROMBECTOMY;  Surgeon: Annice Needy, MD;  Location: [facility] INVASIVE CV LAB;  Service: Cardiovascular;  Laterality: N/A;   TEE WITHOUT CARDIOVERSION N/A 11/08/2021   Procedure: TRANSESOPHAGEAL ECHOCARDIOGRAM (TEE);  Surgeon: Debbe Odea, MD;  Location: [facility] ORS;  Service: Cardiovascular;  Laterality: N/A;       Home Medications    Prior to Admission medications   Medication Sig Start Date End Date Taking? Authorizing Provider  apixaban (ELIQUIS) 5 MG TABS tablet Take 1 tablet (5 mg total) by mouth 2 (two) times daily. 08/07/23   Bensimhon, Bevelyn Buckles, MD  Blood Glucose Monitoring Suppl (BLOOD GLUCOSE MONITOR SYSTEM) w/Device KIT Use up to four times daily as directed. 03/21/22   Arrien, York Ram, MD  dapagliflozin propanediol (FARXIGA) 10 MG TABS tablet Take 1 tablet (10 mg total) by mouth daily. 08/07/23   Bensimhon, Bevelyn Buckles, MD  digoxin (LANOXIN) 0.125 MG tablet Take 1 tablet (125 mcg total) by mouth daily. 08/07/23   Bensimhon, Bevelyn Buckles, MD  ezetimibe (ZETIA) 10 MG tablet Take 1 tablet (10 mg total) by mouth daily. 08/07/23   Bensimhon, Bevelyn Buckles, MD  melatonin 5 MG TABS Take 5 mg by mouth at bedtime as needed.    [provider]  metFORMIN (GLUCOPHAGE) 500 MG tablet Take 1 tablet (500 mg total) by mouth 2 (two) times daily with a meal. 03/19/23   Laurey Morale, MD  metoprolol succinate (TOPROL-XL) 50 MG 24 hr tablet Take 1 tablet (50 mg total) by mouth in the morning and at bedtime. Take with or immediately following a meal. 08/07/23   Bensimhon, Bevelyn Buckles, MD  potassium chloride (KLOR-CON) 20 MEQ packet Take 60 mEq by mouth daily. 08/07/23   Bensimhon, Bevelyn Buckles, MD  rosuvastatin (CRESTOR) 40 MG tablet Take 1 tablet (40 mg total) by mouth daily. 08/07/23   Bensimhon, Bevelyn Buckles, MD  sacubitril-valsartan (ENTRESTO) 97-103 MG Take 1 tablet by mouth 2 (two) times daily. 08/07/23   Bensimhon, Bevelyn Buckles, MD   Semaglutide, 2 MG/DOSE, (OZEMPIC, 2 MG/DOSE,) 8 MG/3ML SOPN Inject 2 mg into the skin once a week. 03/15/23   Debbe Odea, MD  spironolactone (ALDACTONE) 25 MG tablet Take 1 tablet (25 mg total) by mouth daily. 08/07/23   Bensimhon, Bevelyn Buckles, MD  torsemide (DEMADEX) 20 MG tablet Alternating 60 mg every other day with 40 mg every other day 08/07/23   Bensimhon, Bevelyn Buckles, MD    Family History Family History  Problem Relation Age of Onset   Heart failure Mother    Cerebrovascular Accident Mother    Diabetes Father    Hypertension Father    Cancer Cousin        unknown origin   Cancer Cousin        unknown origing    Social History Social History   Tobacco Use   Smoking status: Never   Smokeless tobacco: Never  Vaping Use  Vaping status: Never Used  Substance Use Topics   Alcohol use: Not Currently    Comment: special occasions   Drug use: Yes    Types: Marijuana    Comment: last month     Allergies   Patient has no known allergies.   Review of Systems Review of Systems  Constitutional:  Negative for fever.  HENT:  Positive for ear pain, hearing loss and tinnitus. Negative for ear discharge.      Physical Exam Triage Vital Signs ED Triage Vitals  Encounter Vitals Group     BP      Systolic BP Percentile      Diastolic BP Percentile      Pulse      Resp      Temp      Temp src      SpO2      Weight      Height      Head Circumference      Peak Flow      Pain Score      Pain Loc      Pain Education      Exclude from Growth Chart    No data found.  Updated Vital Signs BP (!) 150/99 (BP Location: Right Arm)   Pulse 69   Temp 98.6 F (37 C) (Oral)   Resp 16   Ht 5\' 10"  (1.778 m)   Wt (!) 345 lb 14.4 oz (156.9 kg)   SpO2 95%   BMI 49.63 kg/m   Visual Acuity Right Eye Distance:   Left Eye Distance:   Bilateral Distance:    Right Eye Near:   Left Eye Near:    Bilateral Near:     Physical Exam Vitals and nursing note reviewed.   Constitutional:      Appearance: Normal appearance. He is not ill-appearing.  HENT:     Head: Normocephalic and atraumatic.     Right Ear: External ear normal. There is impacted cerumen.     Left Ear: External ear normal. There is impacted cerumen.     Ears:     Comments: Dark, hard cerumen occluding both EACs. Skin:    General: Skin is warm and dry.     Capillary Refill: Capillary refill takes less than 2 seconds.  Neurological:     General: No focal deficit present.     Mental Status: He is alert and oriented to person, place, and time.      UC Treatments / Results  Labs (all labs ordered are listed, but only abnormal results are displayed) Labs Reviewed - No data to display  EKG   Radiology No results found.  Procedures Procedures (including critical care time)  Medications Ordered in UC Medications - No data to display  Initial Impression / Assessment and Plan / UC Course  I have reviewed the triage vital signs and the nursing notes.  Pertinent labs & imaging results that were available during my care of the patient were reviewed by me and considered in my medical decision making (see chart for details).   Patient is a pleasant, nontoxic-appearing 27 year old male presenting for evaluation of decreased hearing and ringing in both ears along with pain in both ears that started 3 days ago.  He does have history of cerumen impaction and on exam both external auditory canals are occluded with dry, dark, hard cerumen.  I will order bilateral ear lavage and reassess.  Following ear lavage patient reports that his symptoms have  resolved and he is hearing much better.  Reinspection of both external auditory canals reveals patent canals with pearly gray tympanic membranes and a normal light reflex bilaterally.  I will discharge patient home with diagnosis of cerumen impaction.  We discussed using Debrox once or twice a week at home to prevent further cerumen impaction from  kerning.   Final Clinical Impressions(s) / UC Diagnoses   Final diagnoses:  Bilateral impacted cerumen     Discharge Instructions      Use Debrox once or twice a week before your shower to help emulsify earwax.  Use the shower water to help wash out your ears and prevent wax from accumulating.  If you have a return of symptoms please return for reevaluation.     ED Prescriptions   None    PDMP not reviewed this encounter.   Becky Augusta, NP 08/09/23 1520

## 2023-08-09 NOTE — ED Triage Notes (Signed)
Patient c/o left ear pain that started 3 days ago.  Patient reports right ear pain that started yesterday.  Patient denies fevers.  Patient denies any cold symptoms.

## 2023-08-12 ENCOUNTER — Other Ambulatory Visit: Payer: Self-pay

## 2023-08-12 NOTE — Telephone Encounter (Signed)
This is a CHF pt, Dr. Mclean 

## 2023-08-13 IMAGING — DX DG WRIST COMPLETE 3+V*R*
4 series · 4 of 4 positions shown · non-contrast
Comparison: None.

CLINICAL DATA: Right wrist and left finger pain.

EXAM:
RIGHT WRIST - COMPLETE 3+ VIEW

[wrist ap (1 of 2)]
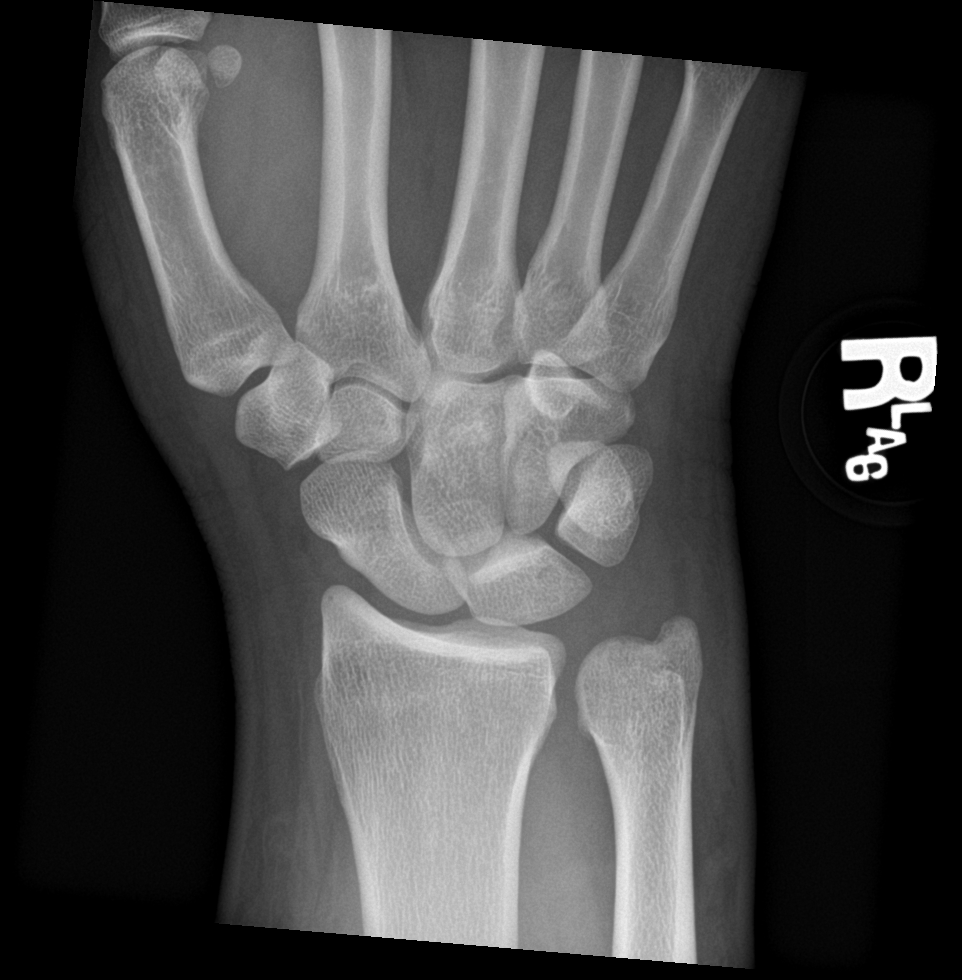

[wrist obl]
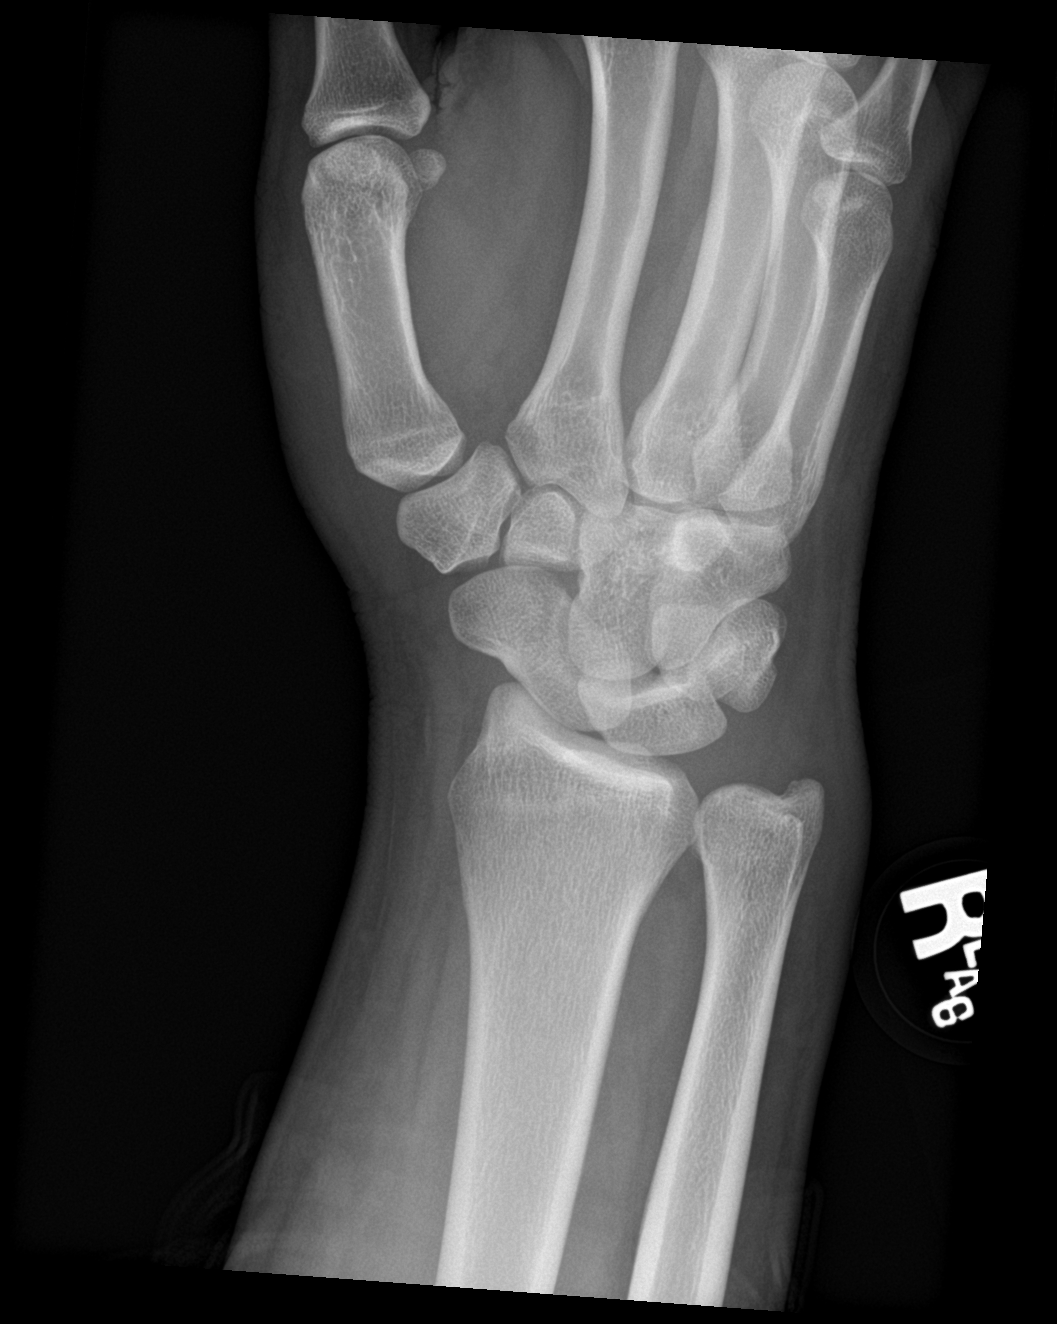

[wrist lat]
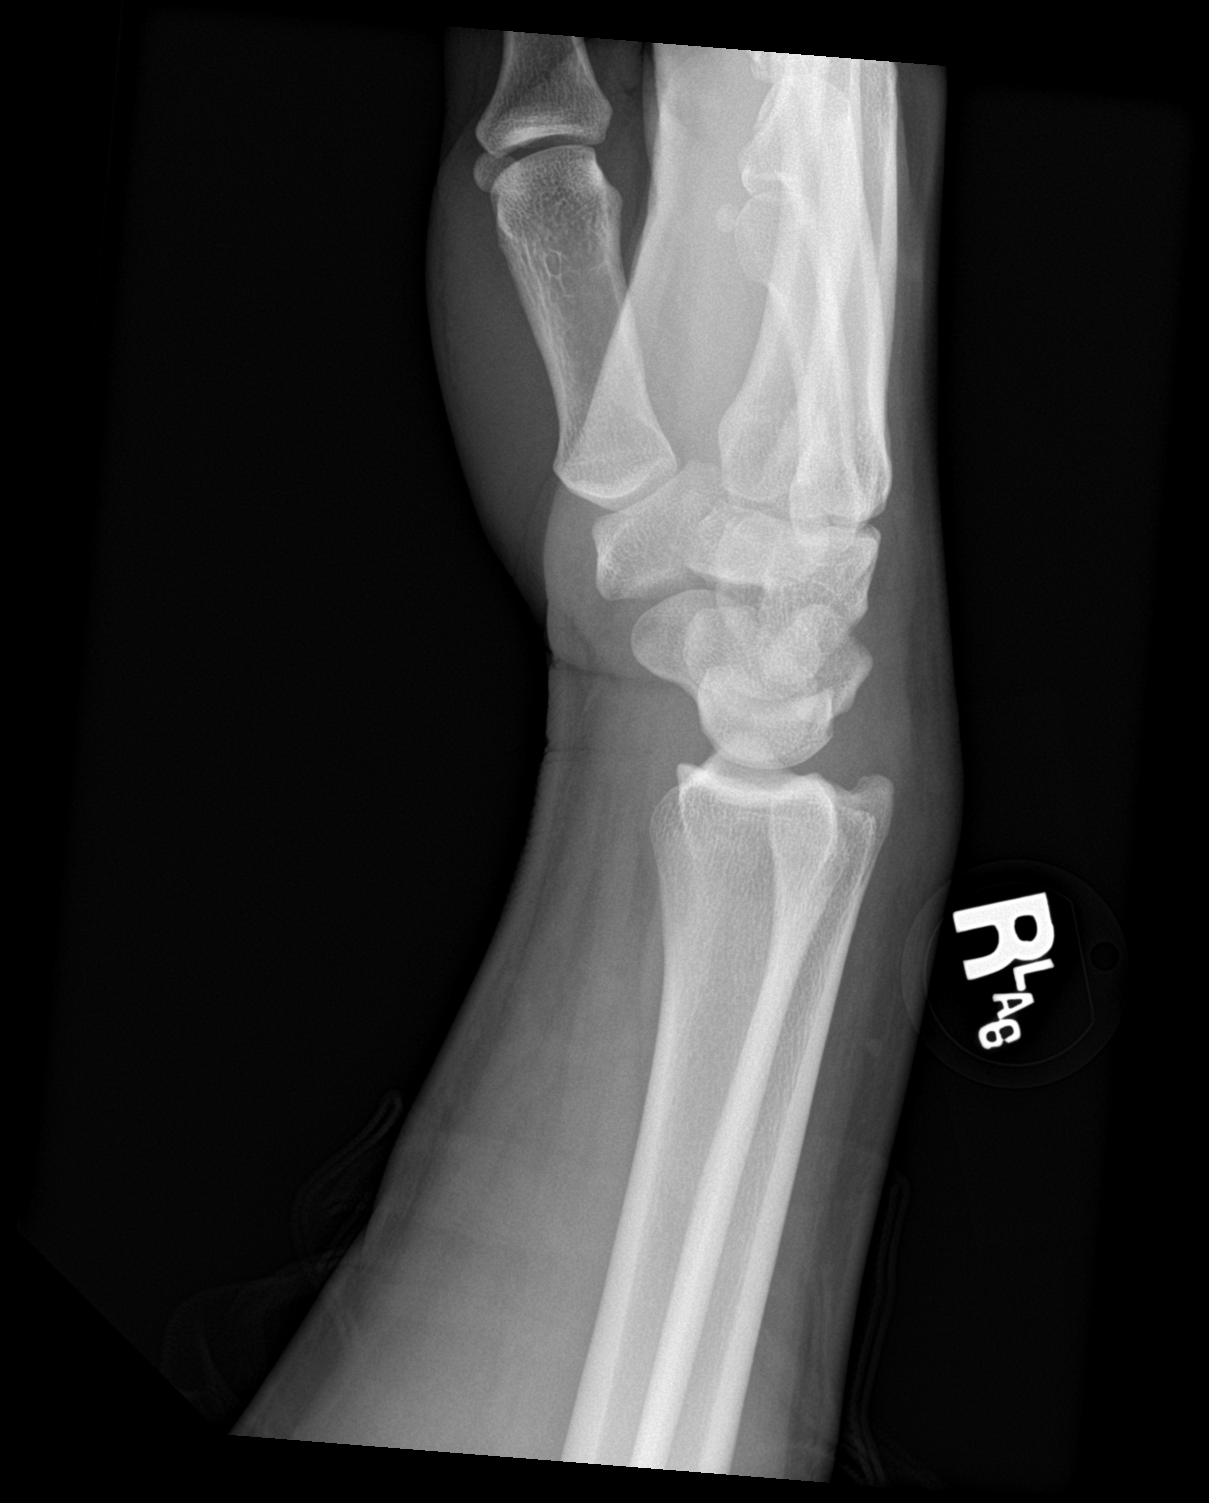

[wrist ap (2 of 2)]
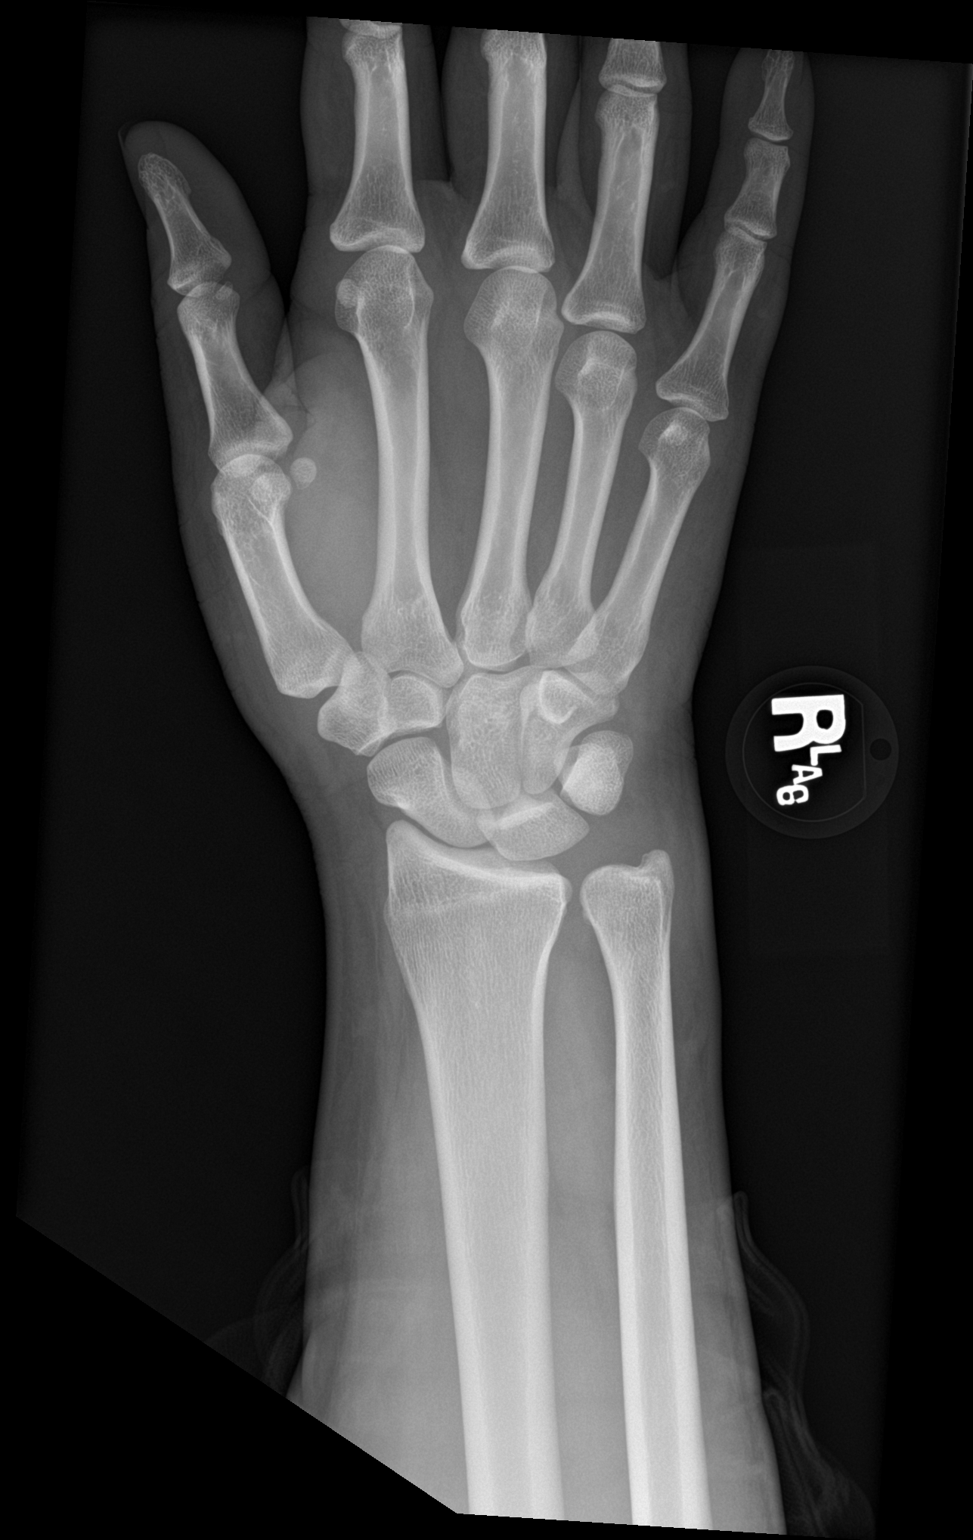

[4 of 4 positions shown; findings below may reference images not displayed]

FINDINGS: There is no evidence of fracture or dislocation. There is no
evidence of arthropathy or other focal bone abnormality. Soft
tissues are unremarkable.
IMPRESSION: Negative.

## 2023-08-28 ENCOUNTER — Ambulatory Visit (INDEPENDENT_AMBULATORY_CARE_PROVIDER_SITE_OTHER): Payer: BLUE CROSS/BLUE SHIELD

## 2023-08-28 ENCOUNTER — Ambulatory Visit: Payer: BLUE CROSS/BLUE SHIELD | Attending: Internal Medicine | Admitting: Internal Medicine

## 2023-08-28 VITALS — BP 121/68 | HR 88 | Ht 71.0 in | Wt 356.0 lb

## 2023-08-28 DIAGNOSIS — I429 Cardiomyopathy, unspecified: Secondary | ICD-10-CM

## 2023-08-28 DIAGNOSIS — Z95810 Presence of automatic (implantable) cardiac defibrillator: Secondary | ICD-10-CM

## 2023-08-28 DIAGNOSIS — I5022 Chronic systolic (congestive) heart failure: Secondary | ICD-10-CM | POA: Diagnosis not present

## 2023-08-28 LAB — CUP PACEART REMOTE DEVICE CHECK
Battery Remaining Longevity: 156 mo
Battery Remaining Percentage: 100 %
Brady Statistic RA Percent Paced: 0 %
Brady Statistic RV Percent Paced: 0 %
Date Time Interrogation Session: 20240828025200
HighPow Impedance: 67 Ohm
Implantable Lead Connection Status: 753985
Implantable Lead Connection Status: 753985
Implantable Lead Implant Date: 20240227
Implantable Lead Implant Date: 20240227
Implantable Lead Location: 753859
Implantable Lead Location: 753860
Implantable Lead Model: 673
Implantable Lead Model: 7841
Implantable Lead Serial Number: 1407551
Implantable Lead Serial Number: 209112
Implantable Pulse Generator Implant Date: 20240227
Lead Channel Impedance Value: 383 Ohm
Lead Channel Impedance Value: 749 Ohm
Lead Channel Pacing Threshold Amplitude: 0.3 V
Lead Channel Pacing Threshold Amplitude: 1.2 V
Lead Channel Pacing Threshold Pulse Width: 0.4 ms
Lead Channel Pacing Threshold Pulse Width: 0.4 ms
Lead Channel Setting Pacing Amplitude: 2 V
Lead Channel Setting Pacing Amplitude: 2.5 V
Lead Channel Setting Pacing Pulse Width: 0.4 ms
Lead Channel Setting Sensing Sensitivity: 0.5 mV
Pulse Gen Serial Number: 652491
Zone Setting Status: 755011

## 2023-08-28 MED ORDER — METOPROLOL SUCCINATE ER 50 MG PO TB24
75.0000 mg | ORAL_TABLET | Freq: Two times a day (BID) | ORAL | 3 refills | Status: DC
Start: 2023-08-28 — End: 2023-11-21

## 2023-08-28 NOTE — Progress Notes (Signed)
Advanced Heart Failure Clinic Note  Primary Care: Mick Sell, MD General Cardiologist: Dr. Azucena Cecil HF Cardiologist: Dr. Gala Romney  HPI: Robert Mccann is a 27 y.o. 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  Admitted 6/22 to [facility] with PE. Underwent thrombectomy/thrombolysis of the right upper/middle/lower lobes. Dopplers negative for DVT. Echo EF <  20% with global hypokinesis and severely reduced RV function. 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    Admitted 11/22 with dysarthria and left hand numbness/weakness. Found to have a large acute posterior right MCA territory infarct. TEE did not show any LA/LAA/LV thrombus, and bubble study was negative.  He reported compliance with Eliquis, which was held in the setting of areas of petechial hemorrhage on follow-up CT.  Unable to complete coronary CTA due to inability to maintain adequate heart rate.   Admitted [facility] 3/23 with CP and SOB. CT chest with small LUL PE, RA clot. Echo EF <20% severe RV dysfunction large LV clot. Transferred to [facility] for further management. No R/LHC with RA and LV clot. cMRI with LVEF 11%, RVEF 19%, large LV thrombus, criteria for LV non-compaction met, subendocardial LGE which can be seen in CAD and noncompaction, moderate pericardial effusion, moderate TR, mild MR. Milrinone weaned and GDMT titrated, no beta blocker with wenckebach AV block. Not currently candidate for advanced therapies due to size and noncompliance. Discharged home, weight 309 lbs.  Follow up 4/23, Echo showed EF <20% moderate RV dysfunction large LV clot. Entresto increased and sleep study arranged.  Admitted 5/23 with rectal bleeding. Eliquis held. GI consulted, underwent colonoscopy showing significant bleeding from anal fissure. Had hemorrhoids, diverticulosis and 1 polyp that was resected  and clipped. Eliquis resumed before discharge.   Echo 8/23: EF 25-30% RV ok   Echo 12/23 EF 25-30%, LV with GHK, normal RV, LA mildly dilated, no MR   S/p boston scientific dual chamber ICD 02/25/22 by Dr. Lalla Brothers.  Today he returns for AHF follow up with his fiance. Overall feeling good. Says his weight has been going up and down. Says he didn't take meds for 4 days this week because he was on a cruise. Wait was up 15 pounds but now back down to baseline 353 pounds after restarting his meds. No CP or SOB. Says he can walk pretty well but gets tired easily. No edema, orthopnea or PND. No bleeding with Eliquis. Can mow grass with push mower.   Cardiac Studies: - Echo 12/23 EF 25-30%, LV with GHK, normal RV, LA mildly dilated, no MR  - Echo 8/23: EF 25-30% RV ok  - Sleep study 8/23 AHI 3.4  - Echo (4/23): EF < 20%, moderate RV dysfunction with large LV clot. - cMRI (3/23): with LVEF 11%, RVEF 19%, large LV thrombus, criteria for LV non-compaction met, subendocardial LGE which can be seen in CAD and noncompaction, moderate pericardial effusion, moderate TR, mild MR.  - Echo (3/23): EF <20% severe RV dysfunction large LV clot. - TEE (11/22): EF 20-25%, global HK LV, severe RV dysfunction, no clot, bubble study negative. - Echo (6/22): EF < 20% with global hypokinesis and severely reduced RV function.  ROS: All systems reviewed and negative except as per HPI.   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 05/2021 noted during periods of sleep @ time of hospitalization for PE.   Cardiomyopathy (  HCC)    a. 05/2021 Echo: EF <20%, glob HK. [place]:C ratio of 2.1:1 suggesting non-compaction. Gr2 DD. Sev reduced RV fxn. Nl PASP. Mildly dil RA. Mild MR; b. 10/2021 Echo: EF <20%, GrII DD; c. 10/2021 TEE: EF 20-25%, glob HK, sev red RV fxn.   Diabetes mellitus without complication (HCC)    HFrEF (heart failure with reduced ejection fraction) (HCC)    a. 05/2021 Echo: EF <20%. ?  non-compaction.   Hypertension    Pulmonary embolism (HCC)    a. 05/2021 CTA Chest: PE extending from lobar arteris of RUL and RLL w/ concern for pulml infarct-->s/p thrombolysis and thrombectomy of RUL/RML/RLL.   Stroke [facility])    a. 10/2021 CT Head: Bilat cerebral infarcts w/ mod-sided acute to subacute R temporoparietal infarct; b. 10/2021 MRI: R post PCA territory infarcts w/o hemorrhage; c. 10/2021 TEE: Neg bubble study. No LA/LAA thrombus. EF 20-25%, glob HK.   Current Outpatient Medications  Medication Sig Dispense Refill   apixaban (ELIQUIS) 5 MG TABS tablet Take 1 tablet (5 mg total) by mouth 2 (two) times daily. 180 tablet 3   Blood Glucose Monitoring Suppl (BLOOD GLUCOSE MONITOR SYSTEM) w/Device KIT Use up to four times daily as directed. 1 kit 3   dapagliflozin propanediol (FARXIGA) 10 MG TABS tablet Take 1 tablet (10 mg total) by mouth daily. 90 tablet 3   digoxin (LANOXIN) 0.125 MG tablet Take 1 tablet (125 mcg total) by mouth daily. 90 tablet 3   ezetimibe (ZETIA) 10 MG tablet Take 1 tablet (10 mg total) by mouth daily. 90 tablet 3   melatonin 5 MG TABS Take 5 mg by mouth at bedtime as needed.     metFORMIN (GLUCOPHAGE) 500 MG tablet Take 1 tablet (500 mg total) by mouth 2 (two) times daily with a meal. 180 tablet 1   metoprolol succinate (TOPROL-XL) 50 MG 24 hr tablet Take 1 tablet (50 mg total) by mouth in the morning and at bedtime. Take with or immediately following a meal. 180 tablet 3   potassium chloride (KLOR-CON) 20 MEQ packet Take 60 mEq by mouth daily. 90 packet 3   rosuvastatin (CRESTOR) 40 MG tablet Take 1 tablet (40 mg total) by mouth daily. 90 tablet 3   sacubitril-valsartan (ENTRESTO) 97-103 MG Take 1 tablet by mouth 2 (two) times daily. 180 tablet 3   Semaglutide, 2 MG/DOSE, (OZEMPIC, 2 MG/DOSE,) 8 MG/3ML SOPN Inject 2 mg into the skin once a week. 3 mL 3   spironolactone (ALDACTONE) 25 MG tablet Take 1 tablet (25 mg total) by mouth daily. 90 tablet 3   torsemide  (DEMADEX) 20 MG tablet Alternating 60 mg every other day with 40 mg every other day 140 tablet 3   No current facility-administered medications for this visit.   No Known Allergies  Social History   Socioeconomic History   Marital status: Single    Spouse name: Not on file   Number of children: Not on file   Years of education: Not on file   Highest education level: Not on file  Occupational History   Not on file  Tobacco Use   Smoking status: Never   Smokeless tobacco: Never  Vaping Use   Vaping status: Never Used  Substance and Sexual Activity   Alcohol use: Not Currently    Comment: special occasions   Drug use: Yes    Types: Marijuana    Comment: last month   Sexual activity: Yes    Partners: Female  Other  Topics Concern   Not on file  Social History Narrative   Smokes weed; No smoking cigarettes; no alcohol. 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 Lives in [place].  NO children.    Social Determinants of Health   Financial Resource Strain: Not on file  Food Insecurity: Not on file  Transportation Needs: Not on file  Physical Activity: Not on file  Stress: Not on file  Social Connections: Not on file  Intimate Partner Violence: Not on file   Family History  Problem Relation Age of Onset   Heart failure Mother    Cerebrovascular Accident Mother    Diabetes Father    Hypertension Father    Cancer Cousin        unknown origin   Cancer Cousin        unknown origing   BP 121/68   Pulse 88   Ht 5\' 11"  (1.803 m)   Wt (!) 356 lb (161.5 kg)   SpO2 99%   BMI 49.65 kg/m   Wt Readings from Last 3 Encounters:  08/28/23 (!) 356 lb (161.5 kg)  08/09/23 (!) 345 lb 14.4 oz (156.9 kg)  05/29/23 (!) 346 lb (156.9 kg)   PHYSICAL EXAM: General:  Well appearing. No resp difficulty HEENT: normal Neck: supple. no JVD. Carotids 2+ bilat; no bruits. No lymphadenopathy or thryomegaly appreciated. Cor: PMI nondisplaced. Regular rate & rhythm. No rubs, gallops  or murmurs. Lungs: clear Abdomen: obese soft, nontender, nondistended. No hepatosplenomegaly. No bruits or masses. Good bowel sounds. Extremities: no cyanosis, clubbing, rash, edema Neuro: alert & orientedx3, cranial nerves grossly intact. moves all 4 extremities w/o difficulty. Affect pleasant   Device interrogation: HeartLogic score 0, activity level 0.8 hrs/day, mean HR 85, No AT/AF  ASSESSMENT & PLAN: 1. 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 No CAD  CAC = 0. - cMRI (3/23): LVEF 11%, RVEF 19%, large LV thrombus, criteria for LV non-compaction met, subendocardial LGE which can be seen in CAD and noncompaction, moderate pericardial effusion, moderate TR, mild MR - Echo (4/23) EF <20% moderate RV dysfunction large LV clot. - Echo 8/23: EF 25-30% RV ok  - Echo 12/23 EF 25-30%, LV with GHK, normal RV, LA mildly dilated, no MR  - Genetic testing with 2 genes of uncertain significance - S/p boston scientific dual chamber ICD 02/25/22 by Dr. Lalla Brothers. - Stable NYHA II Volume looks ok. - Volume status ok. Stop lasix as he has been alternating between random doses (40,80,100,120) . Will try to simplify regimen.  - Continue Torsemide 60 mg daily alternating with 40 QOD. - Continue Entresto 97/103 mg bid. - Continue digoxin 0.125 mg daily. Check level today, has not had meds - Continue spironolactone 25 mg daily. - Continue Farxiga 10 mg daily. - Increase Toprol 75 at bedtime - Repeat echo and check labs  2. 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 - Large LV thrombus on echo and cMRI.in 4/23 - Continue Eliquis 5 mg bid lifelong   3. Pericardial effusion - Moderate in size on cMRI. - Trivial on echo 4/23. - Repeat echo   4. History of PE s/p thrombectomy and thrombolysis with recurrent new PEs most recent admission - Continue Eliquis as  above.   5. History of right posterior PCA territory infarcts (cardioembolic) - At high risk for recurrent events with large LV  thrombus.and LVNC - Continue Eliquis and statin.  6. DM2 - Per PCP - Continue SGLT2i and GLP1RA.   7. Morbid obesity - Body mass index is 49.65 kg/m. - Continue Ozempic - Watch diet  - Refer to [facility]   8. Snoring - sleeps study 8/23 AHI 3.4   Arvilla Meres, MD  10:37 AM

## 2023-08-28 NOTE — Patient Instructions (Addendum)
Medication Changes:  Increase Metoprolol 75 mg (1.5 tablet) two times daily.  Lab Work:  Labs done today, your results will be available in MyChart, we will contact you for abnormal readings.   Testing/Procedures:  Your physician has requested that you have an echocardiogram. Echocardiography is a painless test that uses sound waves to create images of your heart. It provides your doctor with information about the size and shape of your heart and how well your heart's chambers and valves are working. This procedure takes approximately one hour. There are no restrictions for this procedure. Please do NOT wear cologne, perfume, aftershave, or lotions (deodorant is allowed). Please arrive 15 minutes prior to your appointment time.  Echo 11/21/23  Arrive 9:45a    Special Instructions // Education:  Do the following things EVERYDAY: Weigh yourself in the morning before breakfast. Write it down and keep it in a log. Take your medicines as prescribed Eat low salt foods--Limit salt (sodium) to 2000 mg per day.  Stay as active as you can everyday Limit all fluids for the day to less than 2 liters  You have been referred to [facility]. You will receive a call to schedule your appointment.     Follow-Up in: follow up on November.    If you have any questions or concerns before your next appointment please send [place] a message through [place] or call our office at [phone] Monday-Friday 8 am-5 pm.   If you have an urgent need after hours on the weekend please call your Primary Cardiologist or the Advanced Heart Failure Clinic in [place] at [phone].

## 2023-08-29 LAB — BASIC METABOLIC PANEL
BUN/Creatinine Ratio: 14 (ref 9–20)
BUN: 15 mg/dL (ref 6–20)
CO2: 25 mmol/L (ref 20–29)
Calcium: 9.7 mg/dL (ref 8.7–10.2)
Chloride: 96 mmol/L (ref 96–106)
Creatinine, Ser: 1.09 mg/dL (ref 0.76–1.27)
Glucose: 114 mg/dL — ABNORMAL HIGH (ref 70–99)
Potassium: 3.8 mmol/L (ref 3.5–5.2)
Sodium: 142 mmol/L (ref 134–144)
eGFR: 95 mL/min/{1.73_m2} (ref >59)

## 2023-08-29 LAB — CBC
Hematocrit: 45 % (ref 37.5–51.0)
Hemoglobin: 15.4 g/dL (ref 13.0–17.7)
MCH: 30.2 pg (ref 26.6–33.0)
MCHC: 34.2 g/dL (ref 31.5–35.7)
MCV: 88 fL (ref 79–97)
Platelets: 375 10*3/uL (ref 150–450)
RBC: 5.1 x10E6/uL (ref 4.14–5.80)
RDW: 13.1 % (ref 11.6–15.4)
WBC: 5.7 10*3/uL (ref 3.4–10.8)

## 2023-08-29 LAB — BRAIN NATRIURETIC PEPTIDE: BNP: 7.3 pg/mL (ref 0.0–100.0)

## 2023-08-29 LAB — DIGOXIN LEVEL: Digoxin, Serum: <0.4 ng/mL — ABNORMAL LOW (ref 0.5–0.9)

## 2023-09-05 NOTE — Progress Notes (Signed)
Remote ICD transmission.   

## 2023-09-14 ENCOUNTER — Encounter: Payer: Self-pay | Admitting: Emergency Medicine

## 2023-09-14 ENCOUNTER — Ambulatory Visit (INDEPENDENT_AMBULATORY_CARE_PROVIDER_SITE_OTHER): Payer: BLUE CROSS/BLUE SHIELD

## 2023-09-14 ENCOUNTER — Ambulatory Visit
Admission: EM | Admit: 2023-09-14 | Discharge: 2023-09-14 | Disposition: A | Discharge status: Home / Self Care | Payer: BLUE CROSS/BLUE SHIELD | Attending: Family Medicine | Admitting: Family Medicine

## 2023-09-14 DIAGNOSIS — M25511 Pain in right shoulder: Secondary | ICD-10-CM

## 2023-09-14 MED ORDER — TRAMADOL HCL 50 MG PO TABS
50.0000 mg | ORAL_TABLET | Freq: Two times a day (BID) | ORAL | 0 refills | Status: DC | PRN
Start: 2023-09-14 — End: 2023-10-08

## 2023-09-14 NOTE — ED Triage Notes (Signed)
Patient states that he was popping his right arm and felt a sharp pain in his right shoulder.  Patient states that the pain has been ongoing for the past 2 days.

## 2023-09-14 NOTE — ED Provider Notes (Signed)
[facility]    CSN: [id number] Arrival date & time: 09/14/23  1002      History   Chief Complaint Chief Complaint  Patient presents with   Shoulder Pain    right    HPI Robert Mccann is a 27 y.o. male with an extensive past medical history including CHF/cardiomyopathy status post ICD placement, pulm embolism, stroke, hypertension, type 2 diabetes, morbid obesity presents for evaluation of shoulder pain.  Patient reports a 2-day history of right shoulder pain.  Occurred after he outstretched his arm and felt a pop.  He reports decreased range of motion and pain.  Pain currently 6/10 in severity.  No relieving factors.  Does not recall a direct trauma or fall.  No other complaints or concerns at this time.  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 05/2021 noted during periods of sleep @ time of hospitalization for PE.   Cardiomyopathy (HCC)    a. 05/2021 Echo: EF <20%, glob HK. [place]:C ratio of 2.1:1 suggesting non-compaction. Gr2 DD. Sev reduced RV fxn. Nl PASP. Mildly dil RA. Mild MR; b. 10/2021 Echo: EF <20%, GrII DD; c. 10/2021 TEE: EF 20-25%, glob HK, sev red RV fxn.   Diabetes mellitus without complication (HCC)    HFrEF (heart failure with reduced ejection fraction) (HCC)    a. 05/2021 Echo: EF <20%. ? non-compaction.   Hypertension    Pulmonary embolism (HCC)    a. 05/2021 CTA Chest: PE extending from lobar arteris of RUL and RLL w/ concern for pulml infarct-->s/p thrombolysis and thrombectomy of RUL/RML/RLL.   Stroke [facility])    a. 10/2021 CT Head: Bilat cerebral infarcts w/ mod-sided acute to subacute R temporoparietal infarct; b. 10/2021 MRI: R post PCA territory infarcts w/o hemorrhage; c. 10/2021 TEE: Neg bubble study. No LA/LAA thrombus. EF 20-25%, glob HK.    Patient Active Problem List   Diagnosis Date Noted   ICD (implantable cardioverter-defibrillator) in place 02/25/2023   Erythrocytosis 05/30/2022   Cardiomyopathy (HCC)     Chronic congestive heart failure (HCC)    Essential hypertension 03/21/2022   LV (left ventricular) mural thrombus    NSTEMI (non-ST elevated myocardial infarction) (HCC) 03/13/2022   Hyperlipidemia 03/13/2022   Cerebrovascular accident (CVA) (HCC) 11/05/2021   PE (pulmonary thromboembolism) (HCC) 06/28/2021   Obesity, Class III, BMI 40-49.9 (morbid obesity) (HCC)    DM II (diabetes mellitus, type II), controlled (HCC) 06/27/2021   Malaise 05/25/2014    Past Surgical History:  Procedure Laterality Date   COLONOSCOPY N/A 05/18/2022   Procedure: COLONOSCOPY;  Surgeon: [name], MD;  Location: [facility] ENDOSCOPY;  Service: Gastroenterology;  Laterality: N/A;   HAND SURGERY Left age 24   HAND SURGERY     HEMOSTASIS CLIP PLACEMENT  05/18/2022   Procedure: HEMOSTASIS CLIP PLACEMENT;  Surgeon: [name], MD;  Location: [facility] ENDOSCOPY;  Service: Gastroenterology;;   ICD IMPLANT N/A 02/25/2023   Procedure: ICD IMPLANT;  Surgeon: Lanier Prude, MD;  Location: [facility] INVASIVE CV LAB;  Service: Cardiovascular;  Laterality: N/A;   POLYPECTOMY  05/18/2022   Procedure: POLYPECTOMY;  Surgeon: [name], MD;  Location: [facility] ENDOSCOPY;  Service: Gastroenterology;;   PULMONARY THROMBECTOMY N/A 06/28/2021   Procedure: PULMONARY THROMBECTOMY;  Surgeon: Annice Needy, MD;  Location: [facility] INVASIVE CV LAB;  Service: Cardiovascular;  Laterality: N/A;   TEE WITHOUT CARDIOVERSION N/A 11/08/2021   Procedure: TRANSESOPHAGEAL ECHOCARDIOGRAM (TEE);  Surgeon: Debbe Odea, MD;  Location: [facility] ORS;  Service: Cardiovascular;  Laterality: N/A;       Home Medications    Prior to Admission medications   Medication Sig Start Date End Date Taking? Authorizing Provider  traMADol (ULTRAM) 50 MG tablet Take 1 tablet (50 mg total) by mouth every 12 (twelve) hours as needed for moderate pain or severe pain. 09/14/23  Yes Halo Shevlin G, DO  apixaban (ELIQUIS) 5 MG TABS tablet Take 1 tablet (5 mg total) by mouth 2  (two) times daily. 08/07/23   Bensimhon, Bevelyn Buckles, MD  Blood Glucose Monitoring Suppl (BLOOD GLUCOSE MONITOR SYSTEM) w/Device KIT Use up to four times daily as directed. 03/21/22   Arrien, York Ram, MD  dapagliflozin propanediol (FARXIGA) 10 MG TABS tablet Take 1 tablet (10 mg total) by mouth daily. 08/07/23   Bensimhon, Bevelyn Buckles, MD  digoxin (LANOXIN) 0.125 MG tablet Take 1 tablet (125 mcg total) by mouth daily. 08/07/23   Bensimhon, Bevelyn Buckles, MD  ezetimibe (ZETIA) 10 MG tablet Take 1 tablet (10 mg total) by mouth daily. 08/07/23   Bensimhon, Bevelyn Buckles, MD  melatonin 5 MG TABS Take 5 mg by mouth at bedtime as needed.    [provider]  metFORMIN (GLUCOPHAGE) 500 MG tablet Take 1 tablet (500 mg total) by mouth 2 (two) times daily with a meal. 03/19/23   Laurey Morale, MD  metoprolol succinate (TOPROL-XL) 50 MG 24 hr tablet Take 1.5 tablets (75 mg total) by mouth in the morning and at bedtime. Take with or immediately following a meal. 08/28/23   Bensimhon, Bevelyn Buckles, MD  potassium chloride (KLOR-CON) 20 MEQ packet Take 60 mEq by mouth daily. 08/07/23   Bensimhon, Bevelyn Buckles, MD  rosuvastatin (CRESTOR) 40 MG tablet Take 1 tablet (40 mg total) by mouth daily. 08/07/23   Bensimhon, Bevelyn Buckles, MD  sacubitril-valsartan (ENTRESTO) 97-103 MG Take 1 tablet by mouth 2 (two) times daily. 08/07/23   Bensimhon, Bevelyn Buckles, MD  Semaglutide, 2 MG/DOSE, (OZEMPIC, 2 MG/DOSE,) 8 MG/3ML SOPN Inject 2 mg into the skin once a week. 03/15/23   Debbe Odea, MD  spironolactone (ALDACTONE) 25 MG tablet Take 1 tablet (25 mg total) by mouth daily. 08/07/23   Bensimhon, Bevelyn Buckles, MD  torsemide (DEMADEX) 20 MG tablet Alternating 60 mg every other day with 40 mg every other day 08/07/23   Bensimhon, Bevelyn Buckles, MD    Family History Family History  Problem Relation Age of Onset   Heart failure Mother    Cerebrovascular Accident Mother    Diabetes Father    Hypertension Father    Cancer Cousin        unknown origin   Cancer  Cousin        unknown origing    Social History Social History   Tobacco Use   Smoking status: Never   Smokeless tobacco: Never  Vaping Use   Vaping status: Never Used  Substance Use Topics   Alcohol use: Not Currently    Comment: special occasions   Drug use: Yes    Types: Marijuana    Comment: last month     Allergies   Patient has no known allergies.   Review of Systems Review of Systems Per HPI  Physical Exam Triage Vital Signs ED Triage Vitals  Encounter Vitals Group     BP 09/14/23 1023 (!) 142/88     Systolic BP Percentile --      Diastolic BP Percentile --      Pulse  Rate 09/14/23 1023 94     Resp 09/14/23 1023 15     Temp 09/14/23 1023 98.5 F (36.9 C)     Temp Source 09/14/23 1023 Oral     SpO2 09/14/23 1023 94 %     Weight 09/14/23 1023 (!) 356 lb 0.7 oz (161.5 kg)     Height 09/14/23 1023 5\' 11"  (1.803 m)     Head Circumference --      Peak Flow --      Pain Score 09/14/23 1022 6     Pain Loc --      Pain Education --      Exclude from Growth Chart --     Updated Vital Signs BP (!) 142/88 (BP Location: Right Arm)   Pulse 94   Temp 98.5 F (36.9 C) (Oral)   Resp 15   Ht 5\' 11"  (1.803 m)   Wt (!) 161.5 kg   SpO2 94%   BMI 49.66 kg/m   Visual Acuity Right Eye Distance:   Left Eye Distance:   Bilateral Distance:    Right Eye Near:   Left Eye Near:    Bilateral Near:     Physical Exam Vitals and nursing note reviewed.  Constitutional:      General: He is not in acute distress.    Appearance: He is obese.  HENT:     Head: Normocephalic and atraumatic.  Pulmonary:     Effort: Pulmonary effort is normal. No respiratory distress.  Musculoskeletal:     Comments: Right shoulder with tenderness over the bicipital groove.  Decreased range of motion.  Good handgrip strength.  Neurological:     Mental Status: He is alert.  Psychiatric:        Mood and Affect: Mood normal.        Behavior: Behavior normal.      UC Treatments /  Results  Labs (all labs ordered are listed, but only abnormal results are displayed) Labs Reviewed - No data to display  EKG   Radiology DG Shoulder Right  Result Date: 09/14/2023 CLINICAL DATA:  Pain in the right shoulder after popping his arm 2 days ago. EXAM: RIGHT SHOULDER - 2+ VIEW COMPARISON:  None Available. FINDINGS: There is no evidence of fracture or dislocation. There is no evidence of arthropathy or other focal bone abnormality. Soft tissues are unremarkable. IMPRESSION: Negative. Electronically Signed   By: Amie Portland M.D.   On: 09/14/2023 11:20    Procedures Procedures (including critical care time)  Medications Ordered in UC Medications - No data to display  Initial Impression / Assessment and Plan / UC Course  I have reviewed the triage vital signs and the nursing notes.  Pertinent labs & imaging results that were available during my care of the patient were reviewed by me and considered in my medical decision making (see chart for details).    27 year old male presents with acute right shoulder pain.  X-ray was obtained and was independently read by me. Interpretation: Normal x-ray.  No abnormalities.  Cannot use NSAIDs due to patient's medical history and Eliquis.  Tramadol as needed.  Supportive care.  Final Clinical Impressions(s) / UC Diagnoses   Final diagnoses:  Acute pain of right shoulder   Discharge Instructions   None    ED Prescriptions     Medication Sig Dispense Auth. Provider   traMADol (ULTRAM) 50 MG tablet Take 1 tablet (50 mg total) by mouth every 12 (twelve) hours as needed for moderate  pain or severe pain. 10 tablet Everlene Other G, DO      I have reviewed the PDMP during this encounter.   Tommie Sams, [place] 09/14/23 1359

## 2023-09-16 ENCOUNTER — Other Ambulatory Visit (HOSPITAL_COMMUNITY): Payer: Self-pay

## 2023-10-02 ENCOUNTER — Other Ambulatory Visit: Payer: Self-pay | Admitting: *Deleted

## 2023-10-02 ENCOUNTER — Ambulatory Visit: Payer: BLUE CROSS/BLUE SHIELD | Admitting: Dietician

## 2023-10-04 NOTE — Progress Notes (Unsigned)
   Advanced Heart Failure Clinic Note  PCP: Mick Sell, MD  PCP-Cardiologist: Debbe Odea, MD  HF-Cardiologist: {AHFC Cardiologist:210917259}  HPI:  ***  Today Robert Mccann returns to Heart Failure Clinic for pharmacist medication titration. Reports feeling ***. {Reports/Denies:210917258}. {ACTIONS;DENIES/REPORTS:21021675::"Denies"} being able to complete all activities of daily living (ADLs). Is *** active throughout the day. Weight at home is *** pounds. Takes {CHL AMB AHFC Medications:210917260} ***. Appetite ***. {Does Follow/Does Not Follow:210917261} a low sodium diet.  Current HF Medications:                Has the patient been experiencing any side effects to the medications prescribed? {yes/no:20286}  Does the patient have any problems obtaining medications due to transportation or finances? {yes/no:20286}  Understanding of regimen: {CHL AMB AHFC Excellent/Good/Fair/Poor:210917262}  Understanding of indications: {CHL AMB [facility]FC Excellent/Good/Fair/Poor:210917262}  Potential of adherence: {CHL AMB [facility] Excellent/Good/Fair/Poor:210917262}  Patient understands to avoid NSAIDs.  Patient understands to avoid decongestants.  Pertinent Lab Values: Creatinine, Ser  Date Value Ref Range Status  08/28/2023 1.09 0.76 - 1.27 mg/dL Final   BUN  Date Value Ref Range Status  08/28/2023 15 6 - 20 mg/dL Final   Potassium  Date Value Ref Range Status  08/28/2023 3.8 3.5 - 5.2 mmol/L Final   Sodium  Date Value Ref Range Status  08/28/2023 142 134 - 144 mmol/L Final   B Natriuretic Peptide  Date Value Ref Range Status  09/28/2022 32.1 0.0 - 100.0 pg/mL Final    Comment:    Performed at [facility], [address] [place], [place] [address]   BNP  Date Value Ref Range Status  08/28/2023 7.3 0.0 - 100.0 pg/mL Final    Comment:    [employer] ADVIA Centaur XP methodology   Magnesium  Date Value Ref Range Status  05/29/2023 2.0 1.7 - 2.4  mg/dL Final    Comment:    Performed at [facility], [address] [place], [place] [address]   Digoxin Level  Date Value Ref Range Status  05/03/2023 <0.2 (L) 0.8 - 2.0 ng/mL Final    Comment:    RESULT CONFIRMED BY MANUAL DILUTION Performed at [facility], [address], [place], [place] [address]    TSH  Date Value Ref Range Status  03/17/2022 5.035 (H) 0.350 - 4.500 uIU/mL Final    Comment:    Performed by a 3rd Generation assay with a functional sensitivity of <=0.01 uIU/mL. Performed at [facility], [address] [place], [place] [address]     Vital Signs: There were no vitals filed for this visit.  Assessment/Plan:  Follow up: ***  ***

## 2023-10-08 ENCOUNTER — Ambulatory Visit: Payer: BLUE CROSS/BLUE SHIELD | Attending: Internal Medicine | Admitting: Pharmacist

## 2023-10-08 VITALS — BP 126/76 | HR 98 | Wt 351.0 lb

## 2023-10-08 DIAGNOSIS — I5022 Chronic systolic (congestive) heart failure: Secondary | ICD-10-CM

## 2023-10-08 MED ORDER — TORSEMIDE 20 MG PO TABS: 40.0000 mg | ORAL_TABLET | Freq: Every day | ORAL | 3 refills | Status: DC

## 2023-10-08 NOTE — Patient Instructions (Signed)
It was a pleasure seeing you today!  MEDICATIONS: -We are changing your medications today -Increase spironolactone to 25 mg daily -Change torsemide to 40 mg (2 tablets) once daily  -Call if you have questions about your medications.  LABS: -Return for labs at he lower level of the medical arts building in 1 week - (Ask for BMP and BNP) -We will call you if your labs need attention.  NEXT APPOINTMENT: Return to clinic in for echocardiogram and to see Dr. Gala Romney in ~1 month  In general, to take care of your heart failure: -Limit your fluid intake to 2 Liters (half-gallon) per day.   -Limit your salt intake to ideally 2-3 grams (2000-3000 mg) per day. -Weigh yourself daily and record, and bring that "weight diary" to your next appointment.  (Weight gain of 2-3 pounds in 1 day typically means fluid weight.) -The medications for your heart are to help your heart and help you live longer.   -Please contact us before stopping any of your heart medications.  Call the clinic at [phone] with questions or to reschedule future appointments.

## 2023-10-10 ENCOUNTER — Other Ambulatory Visit (HOSPITAL_COMMUNITY): Payer: Self-pay

## 2023-10-11 ENCOUNTER — Other Ambulatory Visit: Payer: Self-pay

## 2023-10-11 MED ORDER — METFORMIN HCL 500 MG PO TABS
500.0000 mg | ORAL_TABLET | Freq: Two times a day (BID) | ORAL | 1 refills | Status: DC
  Filled 2024-01-20: qty 180, 90d supply, fill #0
  Filled 2024-04-30: qty 180, 90d supply, fill #1

## 2023-11-06 ENCOUNTER — Ambulatory Visit: Payer: BLUE CROSS/BLUE SHIELD | Admitting: Dietician

## 2023-11-11 ENCOUNTER — Other Ambulatory Visit: Payer: Self-pay | Admitting: *Deleted

## 2023-11-14 NOTE — Telephone Encounter (Signed)
Patient seeing CHF and they are helping with refills at the moment Will call back to schedule if needed

## 2023-11-20 ENCOUNTER — Telehealth: Payer: Self-pay | Admitting: Cardiology

## 2023-11-20 ENCOUNTER — Telehealth: Payer: Self-pay | Admitting: Internal Medicine

## 2023-11-20 NOTE — Telephone Encounter (Signed)
Called and spoke w/ pt. Notified him that he as a year supply of Zetia and just needs to contact his pharmacy to get his next refill. Pt had good understanding and no further questions.

## 2023-11-20 NOTE — Telephone Encounter (Signed)
*  STAT* If patient is at the pharmacy, call can be transferred to refill team.   1. Which medications need to be refilled? (please list name of each medication and dose if known) Ozempic   2. Would you like to learn more about the convenience, safety, & potential cost savings by using the [facility]? No      3. Are you open to using the [facility] (Type [facility]. no ).   4. Which pharmacy/location (including street and city if local pharmacy) is medication to be sent to?[facility] [place]   5. Do they need a 30 day or 90 day supply? 30

## 2023-11-20 NOTE — Telephone Encounter (Signed)
Hi,   Could you schedule this patient an overdue follow up visit? The patient was last seen by Dr. Azucena Cecil on 08-20-22. Thank you so much.

## 2023-11-20 NOTE — Telephone Encounter (Signed)
*  STAT* If patient is at the pharmacy, call can be transferred to refill team.   1. Which medications need to be refilled? (please list name of each medication and dose if known)   Semaglutide, 2 MG/DOSE, (OZEMPIC, 2 MG/DOSE,) 8 MG/3ML SOPN     2. Would you like to learn more about the convenience, safety, & potential cost savings by using the [facility]? No   3. Are you open to using the [facility] ([facility].) No   4. Which pharmacy/location (including street and city if local pharmacy) is medication to be sent to? [id number] - [place], [place] - [address] [place] [address]    5. Do they need a 30 day or 90 day supply? 30 day

## 2023-11-20 NOTE — Telephone Encounter (Signed)
Please contact pt for future appointment. Pt overdue for 6 month f/u last seen 07/2022. Pt needs appointment.

## 2023-11-21 ENCOUNTER — Other Ambulatory Visit
Admission: RE | Admit: 2023-11-21 | Discharge: 2023-11-21 | Disposition: A | Discharge status: Home / Self Care | Payer: BLUE CROSS/BLUE SHIELD | Source: Ambulatory Visit | Attending: Internal Medicine | Admitting: Internal Medicine

## 2023-11-21 ENCOUNTER — Ambulatory Visit
Admission: RE | Admit: 2023-11-21 | Discharge: 2023-11-21 | Disposition: A | Discharge status: Home / Self Care | Payer: BLUE CROSS/BLUE SHIELD | Source: Ambulatory Visit | Attending: Internal Medicine | Admitting: Internal Medicine

## 2023-11-21 ENCOUNTER — Encounter: Payer: Self-pay | Admitting: Cardiology

## 2023-11-21 ENCOUNTER — Other Ambulatory Visit (HOSPITAL_COMMUNITY): Payer: Self-pay

## 2023-11-21 ENCOUNTER — Ambulatory Visit (HOSPITAL_BASED_OUTPATIENT_CLINIC_OR_DEPARTMENT_OTHER): Payer: BLUE CROSS/BLUE SHIELD | Admitting: Cardiology

## 2023-11-21 VITALS — BP 123/84 | HR 89 | Wt 365.2 lb

## 2023-11-21 DIAGNOSIS — Z6841 Body Mass Index (BMI) 40.0 and over, adult: Secondary | ICD-10-CM

## 2023-11-21 DIAGNOSIS — I5082 Biventricular heart failure: Secondary | ICD-10-CM | POA: Insufficient documentation

## 2023-11-21 DIAGNOSIS — Z8673 Personal history of transient ischemic attack (TIA), and cerebral infarction without residual deficits: Secondary | ICD-10-CM | POA: Insufficient documentation

## 2023-11-21 DIAGNOSIS — N529 Male erectile dysfunction, unspecified: Secondary | ICD-10-CM | POA: Insufficient documentation

## 2023-11-21 DIAGNOSIS — E662 Morbid (severe) obesity with alveolar hypoventilation: Secondary | ICD-10-CM

## 2023-11-21 DIAGNOSIS — Z7901 Long term (current) use of anticoagulants: Secondary | ICD-10-CM | POA: Diagnosis not present

## 2023-11-21 DIAGNOSIS — I5022 Chronic systolic (congestive) heart failure: Secondary | ICD-10-CM

## 2023-11-21 DIAGNOSIS — E119 Type 2 diabetes mellitus without complications: Secondary | ICD-10-CM | POA: Insufficient documentation

## 2023-11-21 DIAGNOSIS — I429 Cardiomyopathy, unspecified: Secondary | ICD-10-CM | POA: Diagnosis not present

## 2023-11-21 DIAGNOSIS — I240 Acute coronary thrombosis not resulting in myocardial infarction: Secondary | ICD-10-CM

## 2023-11-21 DIAGNOSIS — Z7985 Long-term (current) use of injectable non-insulin antidiabetic drugs: Secondary | ICD-10-CM | POA: Insufficient documentation

## 2023-11-21 DIAGNOSIS — I110 Hypertensive heart disease with heart failure: Secondary | ICD-10-CM | POA: Diagnosis not present

## 2023-11-21 DIAGNOSIS — R0683 Snoring: Secondary | ICD-10-CM | POA: Diagnosis not present

## 2023-11-21 DIAGNOSIS — Z79899 Other long term (current) drug therapy: Secondary | ICD-10-CM | POA: Diagnosis not present

## 2023-11-21 DIAGNOSIS — Z86711 Personal history of pulmonary embolism: Secondary | ICD-10-CM | POA: Diagnosis not present

## 2023-11-21 DIAGNOSIS — I3139 Other pericardial effusion (noninflammatory): Secondary | ICD-10-CM | POA: Insufficient documentation

## 2023-11-21 DIAGNOSIS — E66813 Obesity, class 3: Secondary | ICD-10-CM

## 2023-11-21 LAB — BASIC METABOLIC PANEL
Anion gap: 11 (ref 5–15)
BUN: 11 mg/dL (ref 6–20)
CO2: 25 mmol/L (ref 22–32)
Calcium: 9.1 mg/dL (ref 8.9–10.3)
Chloride: 99 mmol/L (ref 98–111)
Creatinine, Ser: 0.96 mg/dL (ref 0.61–1.24)
GFR, Estimated: >60 mL/min (ref >60)
Glucose, Bld: 232 mg/dL — ABNORMAL HIGH (ref 70–99)
Potassium: 3.5 mmol/L (ref 3.5–5.1)
Sodium: 135 mmol/L (ref 135–145)

## 2023-11-21 LAB — ECHOCARDIOGRAM COMPLETE
AR max vel: 3.47 cm2
AV Area VTI: 3.24 cm2
AV Area mean vel: 2.98 cm2
AV Mean grad: 2 mm[Hg]
AV Peak grad: 3.2 mm[Hg]
Ao pk vel: 0.9 m/s
Area-P 1/2: 5.62 cm2
Calc EF: 28.1 %
S' Lateral: 4.3 cm
Single Plane A2C EF: 39 %
Single Plane A4C EF: 17.8 %

## 2023-11-21 LAB — BRAIN NATRIURETIC PEPTIDE: B Natriuretic Peptide: 9.7 pg/mL (ref 0.0–100.0)

## 2023-11-21 MED ORDER — OZEMPIC (2 MG/DOSE) 8 MG/3ML ~~LOC~~ SOPN
2.0000 mg | PEN_INJECTOR | SUBCUTANEOUS | 3 refills | Status: DC
  Filled 2024-01-20 – 2024-01-21 (×2): qty 3, 28d supply, fill #0
  Filled 2024-03-13: qty 3, 28d supply, fill #1
  Filled 2024-04-30: qty 3, 28d supply, fill #2

## 2023-11-21 MED ORDER — SILDENAFIL CITRATE 50 MG PO TABS: 50.0000 mg | ORAL_TABLET | Freq: Every day | ORAL | 0 refills | Status: AC | PRN

## 2023-11-21 NOTE — Patient Instructions (Signed)
INCREASE Toprol to 75mg  daily  Routine lab work today. Will notify you of abnormal results  Follow up in 1 month  Do the following things EVERYDAY: Weigh yourself in the morning before breakfast. Write it down and keep it in a log. Take your medicines as prescribed Eat low salt foods--Limit salt (sodium) to 2000 mg per day.  Stay as active as you can everyday Limit all fluids for the day to less than 2 liters

## 2023-11-21 NOTE — Progress Notes (Signed)
*  PRELIMINARY RESULTS* Echocardiogram 2D Echocardiogram has been performed.  Robert Mccann 11/21/2023, 10:55 AM

## 2023-11-21 NOTE — Progress Notes (Signed)
Advanced Heart Failure Clinic Note  Primary Care: Mick Sell, MD General Cardiologist: Dr. Azucena Cecil HF Cardiologist: Dr. Gala Romney  HPI: Robert Mccann is a 27 y.o. 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  Admitted 6/22 to [facility] with PE. Underwent thrombectomy/thrombolysis of the right upper/middle/lower lobes. Dopplers negative for DVT. Echo EF <  20% with global hypokinesis and severely reduced RV function. 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    Admitted 11/22 with dysarthria and left hand numbness/weakness. Found to have a large acute posterior right MCA territory infarct. TEE did not show any LA/LAA/LV thrombus, and bubble study was negative.  He reported compliance with Eliquis, which was held in the setting of areas of petechial hemorrhage on follow-up CT.  Unable to complete coronary CTA due to inability to maintain adequate heart rate.   Admitted [facility] 3/23 with CP and SOB. CT chest with small LUL PE, RA clot. Echo EF <20% severe RV dysfunction large LV clot. Transferred to [facility] for further management. No R/LHC with RA and LV clot. cMRI with LVEF 11%, RVEF 19%, large LV thrombus, criteria for LV non-compaction met, subendocardial LGE which can be seen in CAD and noncompaction, moderate pericardial effusion, moderate TR, mild MR. Milrinone weaned and GDMT titrated, no beta blocker with wenckebach AV block. Not currently candidate for advanced therapies due to size and noncompliance. Discharged home, weight 309 lbs.  Follow up 4/23, Echo showed EF <20% moderate RV dysfunction large LV clot. Entresto increased and sleep study arranged.  Admitted 5/23 with rectal bleeding. Eliquis held. GI consulted, underwent colonoscopy showing significant bleeding from anal fissure. Had hemorrhoids, diverticulosis and 1 polyp that was resected  and clipped. Eliquis resumed before discharge.   Echo 8/23: EF 25-30% RV ok   Echo 12/23 EF 25-30%, LV with GHK, normal RV, LA mildly dilated, no MR   S/p boston scientific dual chamber ICD 02/25/22 by Dr. Lalla Brothers.  Today he presents for AHF follow up. Since his last appointment he has done very well. He has had no heart failure exacerbations or required increased diuretics. He has started a job once weekly and is now working on obtaining an Investment banker, [employer]. Echocardiogram prior to appointment shows persistently reduced LV function with an EF of 25%-30%. He continues to struggle with weight loss. He is on Ozempic but BMI remains 50. Otherwise, NYHA II functional status.   Cardiac Studies: - Echo 11/21/23: EF 30% with global hypokinesis.  - Echo 12/23 EF 25-30%, LV with GHK, normal RV, LA mildly dilated, no MR  - Echo 8/23: EF 25-30% RV ok  - Sleep study 8/23 AHI 3.4  - Echo (4/23): EF < 20%, moderate RV dysfunction with large LV clot. - cMRI (3/23): with LVEF 11%, RVEF 19%, large LV thrombus, criteria for LV non-compaction met, subendocardial LGE which can be seen in CAD and noncompaction, moderate pericardial effusion, moderate TR, mild MR.  - Echo (3/23): EF <20% severe RV dysfunction large LV clot. - TEE (11/22): EF 20-25%, global HK LV, severe RV dysfunction, no clot, bubble study negative. - Echo (6/22): EF < 20% with global hypokinesis and severely reduced RV function.  ROS: All systems reviewed and negative except as per HPI.   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 05/2021 noted during periods of sleep @ time of hospitalization for PE.  Cardiomyopathy (HCC)    a. 05/2021 Echo: EF <20%, glob HK. Andrew:C ratio of 2.1:1 suggesting non-compaction. Gr2 DD. Sev reduced RV fxn. Nl PASP. Mildly dil RA. Mild MR; b. 10/2021 Echo: EF <20%, GrII DD; c. 10/2021 TEE: EF 20-25%, glob HK, sev red RV fxn.   Diabetes mellitus without complication (HCC)    HFrEF (heart failure  with reduced ejection fraction) (HCC)    a. 05/2021 Echo: EF <20%. ? non-compaction.   Hypertension    Pulmonary embolism (HCC)    a. 05/2021 CTA Chest: PE extending from lobar arteris of RUL and RLL w/ concern for pulml infarct-->s/p thrombolysis and thrombectomy of RUL/RML/RLL.   Stroke [facility])    a. 10/2021 CT Head: Bilat cerebral infarcts w/ mod-sided acute to subacute R temporoparietal infarct; b. 10/2021 MRI: R post PCA territory infarcts w/o hemorrhage; c. 10/2021 TEE: Neg bubble study. No LA/LAA thrombus. EF 20-25%, glob HK.   Current Outpatient Medications  Medication Sig Dispense Refill   apixaban (ELIQUIS) 5 MG TABS tablet Take 1 tablet (5 mg total) by mouth 2 (two) times daily. 180 tablet 3   Blood Glucose Monitoring Suppl (BLOOD GLUCOSE MONITOR SYSTEM) w/Device KIT Use up to four times daily as directed. 1 kit 3   dapagliflozin propanediol (FARXIGA) 10 MG TABS tablet Take 1 tablet (10 mg total) by mouth daily. 90 tablet 3   digoxin (LANOXIN) 0.125 MG tablet Take 1 tablet (125 mcg total) by mouth daily. 90 tablet 3   ezetimibe (ZETIA) 10 MG tablet Take 1 tablet (10 mg total) by mouth daily. 90 tablet 3   melatonin 5 MG TABS Take 5 mg by mouth at bedtime as needed.     metFORMIN (GLUCOPHAGE) 500 MG tablet Take 1 tablet (500 mg total) by mouth 2 (two) times daily with a meal. 180 tablet 1   metoprolol succinate (TOPROL-XL) 50 MG 24 hr tablet Take 50 mg by mouth daily. Take with or immediately following a meal.     potassium chloride (KLOR-CON) 20 MEQ packet Take 60 mEq by mouth daily. 90 packet 3   rosuvastatin (CRESTOR) 40 MG tablet Take 1 tablet (40 mg total) by mouth daily. 90 tablet 3   sacubitril-valsartan (ENTRESTO) 97-103 MG Take 1 tablet by mouth 2 (two) times daily. 180 tablet 3   Semaglutide, 2 MG/DOSE, (OZEMPIC, 2 MG/DOSE,) 8 MG/3ML SOPN Inject 2 mg into the skin once a week. 3 mL 3   spironolactone (ALDACTONE) 25 MG tablet Take 1 tablet (25 mg total) by mouth daily. 90 tablet  3   torsemide (DEMADEX) 20 MG tablet Take 40 mg by mouth daily 60 tablet 3   No current facility-administered medications for this visit.   No Known Allergies  Social History   Socioeconomic History   Marital status: Single    Spouse name: Not on file   Number of children: Not on file   Years of education: Not on file   Highest education level: Not on file  Occupational History   Not on file  Tobacco Use   Smoking status: Never   Smokeless tobacco: Never  Vaping Use   Vaping status: Never Used  Substance and Sexual Activity   Alcohol use: Not Currently    Comment: special occasions   Drug use: Yes    Types: Marijuana    Comment: last month   Sexual activity: Yes    Partners: Female  Other Topics Concern   Not on file  Social History Narrative   Smokes  weed; No smoking cigarettes; no alcohol. 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 Lives in [place].  NO children.    Social Determinants of Health   Financial Resource Strain: Not on file  Food Insecurity: Not on file  Transportation Needs: Not on file  Physical Activity: Not on file  Stress: Not on file  Social Connections: Not on file  Intimate Partner Violence: Not on file   Family History  Problem Relation Age of Onset   Heart failure Mother    Cerebrovascular Accident Mother    Diabetes Father    Hypertension Father    Cancer Cousin        unknown origin   Cancer Cousin        unknown origing   BP 123/84   Pulse 89   Wt (!) 365 lb 4 oz (165.7 kg)   SpO2 100%   BMI 50.94 kg/m   Wt Readings from Last 3 Encounters:  11/21/23 (!) 365 lb 4 oz (165.7 kg)  10/08/23 (!) 351 lb (159.2 kg)  09/14/23 (!) 356 lb 0.7 oz (161.5 kg)   PHYSICAL EXAM: Vitals:   11/21/23 1153  BP: 123/84  Pulse: 89  SpO2: 100%   GENERAL: Overweight M in NAD HEENT: Negative for arcus senilis or xanthelasma. There is no scleral icterus.  The mucous membranes are pink and moist.   NECK: Supple, No masses. Normal  carotid upstrokes without bruits. No masses or thyromegaly.    CHEST: There are no chest wall deformities. There is no chest wall tenderness. Respirations are unlabored.  Lungs- CTA B/L CARDIAC:  JVP: difficult to visualize but does not appear elevated         Normal rate with regular rhythm. No murmurs, rubs or gallops.  Pulses are 2+ and symmetrical in upper and lower extremities. No edema.  ABDOMEN: Soft, non-tender, non-distended. There are no masses or hepatomegaly. There are normal bowel sounds.  EXTREMITIES: Warm and well perfused with no cyanosis, clubbing.  LYMPHATIC: No axillary or supraclavicular lymphadenopathy.  NEUROLOGIC: Patient is oriented x3 with no focal or lateralizing neurologic deficits.  PSYCH: Patients affect is appropriate, there is no evidence of anxiety or depression.  SKIN: Warm and dry; no lesions or wounds.     Device interrogation: HeartLogic score 0, activity level 0.8 hrs/day, mean HR 85, No AT/AF  ASSESSMENT & PLAN: 1. 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 No CAD  CAC = 0. - cMRI (3/23): LVEF 11%, RVEF 19%, large LV thrombus, criteria for LV non-compaction met, subendocardial LGE which can be seen in CAD and noncompaction, moderate pericardial effusion, moderate TR, mild MR - Echo (4/23) EF <20% moderate RV dysfunction large LV clot. - Echo 8/23: EF 25-30% RV ok  - Echo 12/23 EF 25-30%, LV with GHK, normal RV, LA mildly dilated, no MR  - Echo 11/21/23: EF 25%-30% - Genetic testing with 2 genes of uncertain significance - S/p boston scientific dual chamber ICD 02/25/22 by Dr. Lalla Brothers. - Stable NYHA II class.  - Currently on torsemide 40mg  daily; euvolemic on exam. Repeat labs today.  - Continue Entresto 97/103 mg bid. - Continue digoxin 0.125 mg daily. Check level today, has not had meds - Continue spironolactone 25 mg daily. - Continue Farxiga 10 mg  daily. - Increase Toprol to 75mg  daily. Did not increase previously due to ED. Will prescribe viagra today so we can  uptitrate beta blockers.  - Repeat echo today reviewed with him.  - We had a very lengthy discussion today regarding advanced therapies including transplant and BTT LVAD. I explained to him the importance of weight loss given his high risk of progression to Stage D HF at some point in his life.   2. 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 - Large LV thrombus on echo and cMRI.in 4/23 - Continue apixaban life long.  - No contrast used on TTE today.    3. Pericardial effusion - Moderate in size on cMRI. - Trivial on echo 4/23. - Repeat echo w/ no effusion.    4. History of PE s/p thrombectomy and thrombolysis with recurrent new PEs most recent admission - Continue Eliquis as above.   5. History of right posterior PCA territory infarcts (cardioembolic) - At high risk for recurrent events with large LV thrombus.and LVNC - Continue Eliquis and statin.  6. DM2 - Per PCP - Continue SGLT2i and GLP1RA.   7. Morbid obesity - Body mass index is 50.94 kg/m. - Continue Ozempic - Watch diet  - Discussed importance of weight loss extensively today.   8. Snoring - sleeps study 8/23 AHI 3.4  9. Erectile dysfunction - Start viagra 50mg  prn.   I spent 45-50 minutes caring for this patient today including face to face time, ordering and reviewing labs, seeing the patient, documenting in the record, and arranging follow ups. We spent extensive time today discuss advanced therapies including transplant and LVAD as BTT. Much of this time was spent discussing the importance of weight loss with regards to candidacy.    Mackinze Criado, DO  12:13 PM

## 2023-11-22 ENCOUNTER — Telehealth: Payer: Self-pay | Admitting: Pharmacy Technician

## 2023-11-22 ENCOUNTER — Other Ambulatory Visit (HOSPITAL_COMMUNITY): Payer: Self-pay

## 2023-11-22 ENCOUNTER — Telehealth: Payer: Self-pay | Admitting: Pharmacist

## 2023-11-22 NOTE — Telephone Encounter (Signed)
Pharmacy Patient Advocate Encounter   Received notification from Pt Calls Messages that prior authorization for ozempic is required/requested.   Insurance verification completed.   The patient is insured through [facility]  [place] .   Per test claim: PA required; PA submitted to above mentioned insurance via CoverMyMeds Key/confirmation #/EOC [facility] Status is pending

## 2023-11-22 NOTE — Telephone Encounter (Signed)
Pharmacy Patient Advocate Encounter  Received notification from [facility] Medicaid that Prior Authorization for ozempic has been APPROVED from 11/08/23 to 11/21/24   PA #/Case ID/Reference #: [id number]

## 2023-11-22 NOTE — Telephone Encounter (Signed)
Pharmacy Patient Advocate Encounter  Received notification from [facility] that Prior Authorization for ozempic has been CANCELLED due to not required   PA #/Case ID/Reference #: [id number]

## 2023-11-22 NOTE — Telephone Encounter (Signed)
Pharmacy Patient Advocate Encounter   Received notification from Pt Calls Messages that prior authorization for ozempic is required/requested.   Insurance verification completed.   The patient is insured through [facility] .   Per test claim: PA required; PA submitted to above mentioned insurance via CoverMyMeds Key/confirmation #/EOC [id number] Status is pending

## 2023-11-24 NOTE — Progress Notes (Unsigned)
Cardiology Clinic Note   Date: 11/26/2023 ID: Robert Mccann, DOB 01-01-1996, MRN [id number]  Primary Cardiologist:  Debbe Odea, MD  Patient Profile    Robert Mccann is a 27 y.o. male who presents to the clinic today for routine follow up.     Past medical history significant for: Chronic systolic heart failure/nonischemic cardiomyopathy. Coronary CTA 08/20/2022: Calcium score 0.  No evidence of CAD. ICD implantation 02/25/2023. Remote device check 08/28/2023: Normal device function.  Lead parameters stable. Echo 11/21/2023: EF 35 to 40%.  Mild global hypokinesis with severe hypokinesis of the anterior and anteroseptal wall.  Grade I DD.  Normal RV size/function.  Normal PA pressure Mobitz 2 heart block. LV thrombus. Cardiac MRI 03/19/2022: Severe LV dilatation with severe systolic dysfunction, EF 11%.  Moderate RV dilatation with severe systolic dysfunction, EF 19%.  Large LV thrombus adjacent to anterior/anteroseptal walls 58 mm x 27 mm.  Smaller LV thrombus adjacent to inferior wall 39 mm x 9 mm.  Hypertrabeculation of LV that meets criteria for noncompaction though hypertrabeculation can also be seen in dilated cardiomyopathies.  LGE primarily in the noncompaction regions in the basal to apical anterior wall, apex, and apical to mid inferior wall but appears to extend into compacted subendocardial particularly in the apical inferior wall and apex.  Given subendocardial LGE would recommend ruling out obstructive CAD but subendocardial LGE can also be seen in noncompaction.  Moderate pericardial effusion adjacent to LV inferior wall.  Moderate TR.  Mild MR. Hypertension. Hyperlipidemia. Lipid panel 12/27/2022: LDL 62, HDL 40, TG 120, total 126. CVA. PE. Pulmonary thrombectomy 06/28/2021. T2DM.  In summary, patient initially presented to [facility] in June 2022 with chest pain and dyspnea.  CT of the chest revealed pulmonary emboli extending from the lobar arteries of the right upper  and lower lobes and throughout there is segmental branches.  He subsequently underwent thrombectomy and thrombolysis of the right upper/middle/lower lobes.  He was treated with heparin and subsequently Eliquis.  Lower extremity ultrasound was negative for DVT though it was noted that the patient previously spent 12 hours each way to [place] and back about a week or 2 prior to admission.  Echo at that time showed EF <20% with global hypokinesis, Grade II DD, severely reduced RV function.  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  During hospital admission he was noted to have intermittent Mobitz 2 heart block during periods of sleep.  GDMT was started at time of discharge.  At follow-up July 2022 he was only taking carvedilol, Eliquis, low-dose Lasix as he was not able to afford [place].  He was provided with samples and co-pay card for [facility].  At follow-up in September 2022 was placed on spironolactone and Lasix was switched to torsemide.  In November 2022 patient was admitted to [facility] with his 2 to 3-day history of dysarthria and left hand numbness/weakness.  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  TEE did not show LA/LAA/LV thrombus and bubble study was negative.  He reported compliance with Eliquis which was held in the setting of areas of petechial hemorrhage on follow-up CT.  There was a discussion of switching to Coumadin but patient preferred to remain on Eliquis and this was resumed once cleared by neurology.  Upon follow-up in January 2023 patient continued with some aphasia and left-sided visual neglect.  In March 2023 patient underwent hospital admission at [facility] for recurrent PE in the  setting of being off anticoagulation for 2 weeks.  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  He was improving symptomatically but labs were  concerning for multiorgan dysfunction.  He was transferred to [facility] for further management with advanced heart failure service.  Cardiac MRI was performed showing LV thrombus and noncompaction as described above.  Patient followed up in the office with Dr. Azucena Cecil in June 2023 with complaints of bradycardia.  He had self increased Lasix as he felt he was not urinating enough. He had not been taking carvedilol secondary to previous heart block.  He was instructed to return to prior dose of Lasix.  He was seen by heart failure clinic and Lasix was increased.  He underwent sleep study which did not reveal OSA.  Coronary CTA August 2023 showed no evidence of CAD.  Echo December 2023 showed EF 25 to 30%, global hypokinesis, LV internal cavity moderately dilated, normal diastolic parameters, abnormal global strain, normal RV size/function, mild LAE, moderately dilated pulmonary artery.  Patient was evaluated by Dr. Lalla Brothers on 10/10/2022 for consideration of ICD.  He underwent ICD implantation February 2024.     History of Present Illness    Robert Mccann is followed by Dr. Azucena Cecil, Dr. Lalla Brothers and advanced heart failure clinic for the above outlined history.   Patient was last seen by advanced heart failure, Dr. Gasper Lloyd, on 11/21/2023 for routine follow-up.  He was doing well at that time and had not required an increase in diuretics.  Repeat echo showed EF 35 to 40%, global hypokinesis, Grade I DD.  Toprol was increased.  Today, patient is here alone for routine follow up. He reports he is doing well. Patient denies shortness of breath, dyspnea on exertion, lower extremity edema, orthopnea or PND. No chest pain, pressure, or tightness. No palpitations.  He is working on increasing his exercise. He recently joined [employer] and has been going 2 times a week. He is working on weight loss and inquires on what else he can do. We discussed tracking food intake to get a good idea of his daily  caloric intake.      ROS: All other systems reviewed and are otherwise negative except as noted in History of Present Illness.  Studies Reviewed    EKG Interpretation Date/Time:  Tuesday November 26 2023 11:54:19 EST Ventricular Rate:  88 PR Interval:  174 QRS Duration:  96 QT Interval:  398 QTC Calculation: 481 R Axis:   20  Text Interpretation: Normal sinus rhythm Nonspecific T wave abnormality When compared with ECG of 25-Feb-2023 14:12, No significant change was found Confirmed by Robert Mccann [phone]) on 11/26/2023 12:01:05 PM           Physical Exam    VS:  BP 131/88   Pulse 88   Ht 5\' 11"  (1.803 m)   Wt (!) 361 lb 6.4 oz (163.9 kg)   SpO2 97%   BMI 50.41 kg/m  , BMI Body mass index is 50.41 kg/m.  GEN: Well nourished, well developed, in no acute distress. Neck: No JVD or carotid bruits. Cardiac:  RRR. No murmurs. No rubs or gallops.   Respiratory:  Respirations regular and unlabored. Clear to auscultation without rales, wheezing or rhonchi. GI: Soft, nontender, nondistended. Extremities: Radials/DP/PT 2+ and equal bilaterally. No clubbing or cyanosis. No edema.  Skin: Warm and dry, no rash. Neuro: Strength intact.  Assessment & Plan   Chronic systolic heart failure/nonischemic cardiomyopathy/LV noncompaction  S/p ICD implantation February 2024.  Remote device check August 2024 showed normal device function and stable lead parameters.  Patient denies lower extremity edema, orthopnea or PND.  Euvolemic and well compensated on exam.  -Continue torsemide, spironolactone, Entresto, Toprol, digoxin, Farxiga.  LV thrombus Large LV thrombus on echo and cardiac MRI April 2023.  Patient denies spontaneous bleeding concerns. -Continue Eliquis.  Hypertension BP today 131/88. No reports of headaches or dizziness.  -Continue Entresto, spironolactone, Toprol.  Hyperlipidemia LDL December 2023 62, at goal. -Continue rosuvastatin.   Weight loss Patient is on  Ozempic and recently joined [employer] He is very motivated to lose weight.  -Consider tracking food to determine daily caloric intake.  -Continue to advance physical activity as tolerated.       Disposition: Return in 6 months or sooner as needed.          Signed, Robert Grandchild. Deldrick Linch, DNP, NP-C

## 2023-11-25 NOTE — Telephone Encounter (Signed)
Patient managed to get Ozempic yesterday from [place]. Aware of PA approval.

## 2023-11-25 NOTE — Telephone Encounter (Signed)
PA for Ozempic approved and patient made aware

## 2023-11-26 ENCOUNTER — Encounter: Payer: Self-pay | Admitting: Student

## 2023-11-26 ENCOUNTER — Ambulatory Visit: Payer: BLUE CROSS/BLUE SHIELD | Attending: Student | Admitting: Student

## 2023-11-26 VITALS — BP 131/88 | HR 88 | Ht 71.0 in | Wt 361.4 lb

## 2023-11-26 DIAGNOSIS — I5022 Chronic systolic (congestive) heart failure: Secondary | ICD-10-CM

## 2023-11-26 DIAGNOSIS — E782 Mixed hyperlipidemia: Secondary | ICD-10-CM | POA: Diagnosis not present

## 2023-11-26 DIAGNOSIS — I10 Essential (primary) hypertension: Secondary | ICD-10-CM

## 2023-11-26 DIAGNOSIS — I513 Intracardiac thrombosis, not elsewhere classified: Secondary | ICD-10-CM | POA: Diagnosis not present

## 2023-11-26 DIAGNOSIS — R634 Abnormal weight loss: Secondary | ICD-10-CM

## 2023-11-26 NOTE — Patient Instructions (Signed)
Medication Instructions:  - Your physician recommends that you continue on your current medications as directed. Please refer to the Current Medication list given to you today.  *If you need a refill on your cardiac medications before your next appointment, please call your pharmacy*   Lab Work: - none ordered  If you have labs (blood work) drawn today and your tests are completely normal, you will receive your results only by: MyChart Message (if you have MyChart) OR A paper copy in the mail If you have any lab test that is abnormal or we need to change your treatment, we will call you to review the results.   Testing/Procedures: - none ordered   Follow-Up: At [facility], you and your health needs are our priority.  As part of our continuing mission to provide you with exceptional heart care, we have created designated Provider Care Teams.  These Care Teams include your primary Cardiologist (physician) and Advanced Practice Providers (APPs -  Physician Assistants and Nurse Practitioners) who all work together to provide you with the care you need, when you need it.  We recommend signing up for the patient portal called "MyChart".  Sign up information is provided on this After Visit Summary.  MyChart is used to connect with patients for Virtual Visits (Telemedicine).  Patients are able to view lab/test results, encounter notes, upcoming appointments, etc.  Non-urgent messages can be sent to your provider as well.   To learn more about what you can do with MyChart, go to [email or web address].    Your next appointment:   6 month(s)  Provider:   You may see Debbe Odea, MD or one of the following Advanced Practice Providers on your designated Care Team:    Carlos Levering, NP    Other Instructions N/a

## 2023-11-27 ENCOUNTER — Ambulatory Visit (INDEPENDENT_AMBULATORY_CARE_PROVIDER_SITE_OTHER): Payer: BLUE CROSS/BLUE SHIELD

## 2023-11-27 DIAGNOSIS — I428 Other cardiomyopathies: Secondary | ICD-10-CM | POA: Diagnosis not present

## 2023-11-27 LAB — CUP PACEART REMOTE DEVICE CHECK
Battery Remaining Longevity: 156 mo
Battery Remaining Percentage: 100 %
Brady Statistic RA Percent Paced: 0 %
Brady Statistic RV Percent Paced: 0 %
Date Time Interrogation Session: 20241127025200
HighPow Impedance: 71 Ohm
Implantable Lead Connection Status: 753985
Implantable Lead Connection Status: 753985
Implantable Lead Implant Date: 20240227
Implantable Lead Implant Date: 20240227
Implantable Lead Location: 753859
Implantable Lead Location: 753860
Implantable Lead Model: 673
Implantable Lead Model: 7841
Implantable Lead Serial Number: 1407551
Implantable Lead Serial Number: 209112
Implantable Pulse Generator Implant Date: 20240227
Lead Channel Impedance Value: 385 Ohm
Lead Channel Impedance Value: 723 Ohm
Lead Channel Pacing Threshold Amplitude: 0.4 V
Lead Channel Pacing Threshold Amplitude: 1.2 V
Lead Channel Pacing Threshold Pulse Width: 0.4 ms
Lead Channel Pacing Threshold Pulse Width: 0.4 ms
Lead Channel Setting Pacing Amplitude: 2 V
Lead Channel Setting Pacing Amplitude: 2.5 V
Lead Channel Setting Pacing Pulse Width: 0.4 ms
Lead Channel Setting Sensing Sensitivity: 0.5 mV
Pulse Gen Serial Number: 652491
Zone Setting Status: 755011

## 2023-12-03 ENCOUNTER — Other Ambulatory Visit (HOSPITAL_COMMUNITY): Payer: Self-pay

## 2023-12-30 ENCOUNTER — Encounter: Payer: BLUE CROSS/BLUE SHIELD | Admitting: Cardiology

## 2024-01-16 ENCOUNTER — Other Ambulatory Visit: Payer: Self-pay

## 2024-01-17 ENCOUNTER — Other Ambulatory Visit: Payer: Self-pay

## 2024-01-17 MED ORDER — TORSEMIDE 20 MG PO TABS
40.0000 mg | ORAL_TABLET | ORAL | 1 refills | Status: DC
  Filled 2024-01-17: qty 140, 49d supply, fill #0
  Filled 2024-03-13: qty 140, 49d supply, fill #1

## 2024-01-17 MED ORDER — METOPROLOL SUCCINATE ER 50 MG PO TB24
50.0000 mg | ORAL_TABLET | Freq: Two times a day (BID) | ORAL | 0 refills | Status: DC
  Filled 2024-01-17 – 2024-01-21 (×3): qty 180, 90d supply, fill #0

## 2024-01-20 ENCOUNTER — Other Ambulatory Visit: Payer: Self-pay

## 2024-01-20 MED ORDER — SPIRONOLACTONE 25 MG PO TABS
25.0000 mg | ORAL_TABLET | Freq: Every day | ORAL | 0 refills | Status: DC
  Filled 2024-01-20: qty 15, 15d supply, fill #0
  Filled 2024-01-20: qty 75, 75d supply, fill #0

## 2024-01-20 MED ORDER — DAPAGLIFLOZIN PROPANEDIOL 10 MG PO TABS
10.0000 mg | ORAL_TABLET | Freq: Every day | ORAL | 0 refills | Status: DC
  Filled 2024-01-20: qty 90, 90d supply, fill #0

## 2024-01-21 ENCOUNTER — Other Ambulatory Visit: Payer: Self-pay

## 2024-01-22 ENCOUNTER — Other Ambulatory Visit: Payer: Self-pay

## 2024-01-24 ENCOUNTER — Encounter (HOSPITAL_COMMUNITY): Payer: Self-pay | Admitting: *Deleted

## 2024-01-27 ENCOUNTER — Other Ambulatory Visit: Payer: Self-pay

## 2024-01-27 ENCOUNTER — Other Ambulatory Visit
Admission: RE | Admit: 2024-01-27 | Discharge: 2024-01-27 | Disposition: A | Discharge status: Home / Self Care | Payer: BLUE CROSS/BLUE SHIELD | Source: Ambulatory Visit | Attending: Cardiology | Admitting: Cardiology

## 2024-01-27 ENCOUNTER — Ambulatory Visit (HOSPITAL_BASED_OUTPATIENT_CLINIC_OR_DEPARTMENT_OTHER): Payer: BLUE CROSS/BLUE SHIELD | Admitting: Cardiology

## 2024-01-27 ENCOUNTER — Encounter: Payer: Self-pay | Admitting: Cardiology

## 2024-01-27 VITALS — BP 124/87 | HR 87 | Ht 72.0 in | Wt 362.0 lb

## 2024-01-27 DIAGNOSIS — Z86711 Personal history of pulmonary embolism: Secondary | ICD-10-CM | POA: Insufficient documentation

## 2024-01-27 DIAGNOSIS — N529 Male erectile dysfunction, unspecified: Secondary | ICD-10-CM | POA: Insufficient documentation

## 2024-01-27 DIAGNOSIS — I428 Other cardiomyopathies: Secondary | ICD-10-CM

## 2024-01-27 DIAGNOSIS — I110 Hypertensive heart disease with heart failure: Secondary | ICD-10-CM | POA: Insufficient documentation

## 2024-01-27 DIAGNOSIS — Z7901 Long term (current) use of anticoagulants: Secondary | ICD-10-CM | POA: Insufficient documentation

## 2024-01-27 DIAGNOSIS — I5022 Chronic systolic (congestive) heart failure: Secondary | ICD-10-CM

## 2024-01-27 DIAGNOSIS — Z8673 Personal history of transient ischemic attack (TIA), and cerebral infarction without residual deficits: Secondary | ICD-10-CM | POA: Insufficient documentation

## 2024-01-27 DIAGNOSIS — I240 Acute coronary thrombosis not resulting in myocardial infarction: Secondary | ICD-10-CM

## 2024-01-27 DIAGNOSIS — R0683 Snoring: Secondary | ICD-10-CM | POA: Diagnosis not present

## 2024-01-27 DIAGNOSIS — Z79899 Other long term (current) drug therapy: Secondary | ICD-10-CM | POA: Diagnosis not present

## 2024-01-27 DIAGNOSIS — I2699 Other pulmonary embolism without acute cor pulmonale: Secondary | ICD-10-CM | POA: Diagnosis not present

## 2024-01-27 DIAGNOSIS — Z7985 Long-term (current) use of injectable non-insulin antidiabetic drugs: Secondary | ICD-10-CM | POA: Diagnosis not present

## 2024-01-27 DIAGNOSIS — E119 Type 2 diabetes mellitus without complications: Secondary | ICD-10-CM | POA: Insufficient documentation

## 2024-01-27 DIAGNOSIS — Z6841 Body Mass Index (BMI) 40.0 and over, adult: Secondary | ICD-10-CM | POA: Insufficient documentation

## 2024-01-27 DIAGNOSIS — I3139 Other pericardial effusion (noninflammatory): Secondary | ICD-10-CM | POA: Diagnosis not present

## 2024-01-27 LAB — BASIC METABOLIC PANEL
Anion gap: 12 (ref 5–15)
BUN: 11 mg/dL (ref 6–20)
CO2: 25 mmol/L (ref 22–32)
Calcium: 9 mg/dL (ref 8.9–10.3)
Chloride: 98 mmol/L (ref 98–111)
Creatinine, Ser: 0.92 mg/dL (ref 0.61–1.24)
GFR, Estimated: >60 mL/min (ref >60)
Glucose, Bld: 137 mg/dL — ABNORMAL HIGH (ref 70–99)
Potassium: 3.6 mmol/L (ref 3.5–5.1)
Sodium: 135 mmol/L (ref 135–145)

## 2024-01-27 LAB — BRAIN NATRIURETIC PEPTIDE: B Natriuretic Peptide: 7.1 pg/mL (ref 0.0–100.0)

## 2024-01-27 MED ORDER — METOPROLOL SUCCINATE ER 100 MG PO TB24: 100.0000 mg | ORAL_TABLET | Freq: Every day | ORAL | 3 refills | Status: DC

## 2024-01-27 MED ORDER — METOPROLOL SUCCINATE ER 100 MG PO TB24
100.0000 mg | ORAL_TABLET | Freq: Every day | ORAL | 3 refills | Status: DC
  Filled 2024-01-27 – 2024-03-13 (×2): qty 30, 30d supply, fill #0
  Filled 2024-04-30: qty 30, 30d supply, fill #1
  Filled 2024-06-02: qty 30, 30d supply, fill #2
  Filled 2024-07-16: qty 30, 30d supply, fill #3

## 2024-01-27 NOTE — Progress Notes (Unsigned)
Advanced Heart Failure Clinic Note  Primary Care: Mick Sell, MD General Cardiologist: Dr. Azucena Cecil HF Cardiologist: Dr. Gala Romney  HPI: Robert Mccann is a 28 y.o. 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  Admitted 6/22 to [facility] with PE. Underwent thrombectomy/thrombolysis of the right upper/middle/lower lobes. Dopplers negative for DVT. Echo EF <  20% with global hypokinesis and severely reduced RV function. 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    Admitted 11/22 with dysarthria and left hand numbness/weakness. Found to have a large acute posterior right MCA territory infarct. TEE did not show any LA/LAA/LV thrombus, and bubble study was negative.  He reported compliance with Eliquis, which was held in the setting of areas of petechial hemorrhage on follow-up CT.  Unable to complete coronary CTA due to inability to maintain adequate heart rate.   Admitted [facility] 3/23 with CP and SOB. CT chest with small LUL PE, RA clot. Echo EF <20% severe RV dysfunction large LV clot. Transferred to [facility] for further management. No R/LHC with RA and LV clot. cMRI with LVEF 11%, RVEF 19%, large LV thrombus, criteria for LV non-compaction met, subendocardial LGE which can be seen in CAD and noncompaction, moderate pericardial effusion, moderate TR, mild MR. Milrinone weaned and GDMT titrated, no beta blocker with wenckebach AV block. Not currently candidate for advanced therapies due to size and noncompliance. Discharged home, weight 309 lbs.  Follow up 4/23, Echo showed EF <20% moderate RV dysfunction large LV clot. Entresto increased and sleep study arranged.  Admitted 5/23 with rectal bleeding. Eliquis held. GI consulted, underwent colonoscopy showing significant bleeding from anal fissure. Had hemorrhoids, diverticulosis and 1 polyp that was resected  and clipped. Eliquis resumed before discharge.   Echo 8/23: EF 25-30% RV ok   Echo 12/23 EF 25-30%, LV with GHK, normal RV, LA mildly dilated, no MR   S/p boston scientific dual chamber ICD 02/25/22 by Dr. Lalla Brothers.  Today he presents for AHF follow up. Since his last appointment he has done very well. He has had no heart failure exacerbations or required increased diuretics. He has started a job once weekly and is now working on obtaining an Investment banker, corporate. Echocardiogram prior to appointment shows persistently reduced LV function with an EF of 25%-30%. He continues to struggle with weight loss. He is on Ozempic but BMI remains 50. Otherwise, NYHA II functional status.   Cardiac Studies: - Echo 11/21/23: EF 30% with global hypokinesis.  - Echo 12/23 EF 25-30%, LV with GHK, normal RV, LA mildly dilated, no MR  - Echo 8/23: EF 25-30% RV ok  - Sleep study 8/23 AHI 3.4  - Echo (4/23): EF < 20%, moderate RV dysfunction with large LV clot. - cMRI (3/23): with LVEF 11%, RVEF 19%, large LV thrombus, criteria for LV non-compaction met, subendocardial LGE which can be seen in CAD and noncompaction, moderate pericardial effusion, moderate TR, mild MR.  - Echo (3/23): EF <20% severe RV dysfunction large LV clot. - TEE (11/22): EF 20-25%, global HK LV, severe RV dysfunction, no clot, bubble study negative. - Echo (6/22): EF < 20% with global hypokinesis and severely reduced RV function.  ROS: All systems reviewed and negative except as per HPI.   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 05/2021 noted during periods of sleep @ time of hospitalization for PE.  Cardiomyopathy (HCC)    a. 05/2021 Echo: EF <20%, glob HK. [place]:C ratio of 2.1:1 suggesting non-compaction. Gr2 DD. Sev reduced RV fxn. Nl PASP. Mildly dil RA. Mild MR; b. 10/2021 Echo: EF <20%, GrII DD; c. 10/2021 TEE: EF 20-25%, glob HK, sev red RV fxn.   Diabetes mellitus without complication (HCC)    HFrEF (heart failure  with reduced ejection fraction) (HCC)    a. 05/2021 Echo: EF <20%. ? non-compaction.   Hypertension    Pulmonary embolism (HCC)    a. 05/2021 CTA Chest: PE extending from lobar arteris of RUL and RLL w/ concern for pulml infarct-->s/p thrombolysis and thrombectomy of RUL/RML/RLL.   Stroke [facility])    a. 10/2021 CT Head: Bilat cerebral infarcts w/ mod-sided acute to subacute R temporoparietal infarct; b. 10/2021 MRI: R post PCA territory infarcts w/o hemorrhage; c. 10/2021 TEE: Neg bubble study. No LA/LAA thrombus. EF 20-25%, glob HK.   Current Outpatient Medications  Medication Sig Dispense Refill   apixaban (ELIQUIS) 5 MG TABS tablet Take 1 tablet (5 mg total) by mouth 2 (two) times daily. 180 tablet 3   Blood Glucose Monitoring Suppl (BLOOD GLUCOSE MONITOR SYSTEM) w/Device KIT Use up to four times daily as directed. 1 kit 3   dapagliflozin propanediol (FARXIGA) 10 MG TABS tablet Take 1 tablet (10 mg total) by mouth daily. 90 tablet 3   dapagliflozin propanediol (FARXIGA) 10 MG TABS tablet Take 1 tablet (10 mg total) by mouth daily. 90 tablet 0   digoxin (LANOXIN) 0.125 MG tablet Take 1 tablet (125 mcg total) by mouth daily. 90 tablet 1   ezetimibe (ZETIA) 10 MG tablet Take 1 tablet (10 mg total) by mouth daily. 90 tablet 2   Fluocinolone Acetonide Body 0.01 % OIL Apply to scalp, face, beard up to 2 times a day as needed for scaling, itching, redness.     furosemide (LASIX) 40 MG tablet Take by mouth.     ketoconazole (NIZORAL) 2 % shampoo Apply topically.     melatonin 5 MG TABS Take 5 mg by mouth at bedtime as needed. (Patient not taking: Reported on 11/26/2023)     metFORMIN (GLUCOPHAGE) 500 MG tablet Take 1 tablet (500 mg total) by mouth 2 (two) times daily with a meal. 180 tablet 1   metoprolol succinate (TOPROL-XL) 50 MG 24 hr tablet Take 75 mg by mouth daily. Take with or immediately following a meal. 1 - 2 tablets daily     metoprolol succinate (TOPROL-XL) 50 MG 24 hr tablet Take 1 tablet  (50 mg total) by mouth 2 (two) times daily after a meal. 180 tablet 0   potassium chloride (KLOR-CON) 20 MEQ packet Dissolve and take 60 mEq (3 packets) by mouth daily. 90 packet 1   rosuvastatin (CRESTOR) 40 MG tablet Take 1 tablet (40 mg total) by mouth daily. 90 tablet 1   sacubitril-valsartan (ENTRESTO) 97-103 MG Take 1 tablet by mouth 2 (two) times daily. 180 tablet 1   Semaglutide, 2 MG/DOSE, (OZEMPIC, 2 MG/DOSE,) 8 MG/3ML SOPN Inject 2 mg into the skin once a week. 3 mL 3   sildenafil (VIAGRA) 50 MG tablet Take 1 tablet (50 mg total) by mouth daily as needed for erectile dysfunction. 10 tablet 0   spironolactone (ALDACTONE) 25 MG tablet Take 1 tablet (25 mg total) by mouth daily. 90 tablet 3   spironolactone (ALDACTONE) 25 MG tablet Take 1 tablet (25 mg total) by mouth daily. 90 tablet 0   torsemide (DEMADEX) 20 MG  tablet Take 2 tablets (40 mg total) by mouth every other day alternating with 3 tablets (60 mg total) every other day. 140 tablet 1   No current facility-administered medications for this visit.   No Known Allergies  Social History   Socioeconomic History   Marital status: Single    Spouse name: Not on file   Number of children: Not on file   Years of education: Not on file   Highest education level: Not on file  Occupational History   Not on file  Tobacco Use   Smoking status: Never   Smokeless tobacco: Never  Vaping Use   Vaping status: Never Used  Substance and Sexual Activity   Alcohol use: Not Currently    Comment: special occasions   Drug use: Yes    Types: Marijuana    Comment: last month   Sexual activity: Yes    Partners: Female  Other Topics Concern   Not on file  Social History Narrative   Smokes weed; No smoking cigarettes; no alcohol. 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 Lives in [place].  NO children.    Social Drivers of Corporate investment banker Strain: Not on file  Food Insecurity: Not on file  Transportation Needs: Not on  file  Physical Activity: Not on file  Stress: Not on file  Social Connections: Not on file  Intimate Partner Violence: Not on file   Family History  Problem Relation Age of Onset   Heart failure Mother    Cerebrovascular Accident Mother    Diabetes Father    Hypertension Father    Cancer Cousin        unknown origin   Cancer Cousin        unknown origing   Ht 6' (1.829 m)   Wt (!) 362 lb (164.2 kg)   BMI 49.10 kg/m   Wt Readings from Last 3 Encounters:  01/27/24 (!) 362 lb (164.2 kg)  11/26/23 (!) 361 lb 6.4 oz (163.9 kg)  11/21/23 (!) 365 lb 4 oz (165.7 kg)   PHYSICAL EXAM: There were no vitals filed for this visit. GENERAL: Well nourished, well developed, and in no apparent distress at rest.  HEENT: There is no scleral icterus.  The mucous membranes are pink and moist.   CHEST: There are no chest wall deformities. There is no chest wall tenderness. Respirations are unlabored.  Lungs- *** CARDIAC:  JVP: *** cm          Normal rate with regular rhythm. *** murmur.  Pulses are 2+ and symmetrical in upper and lower extremities. *** edema.  ABDOMEN: Soft, non-tender, non-distended. There are normal bowel sounds.  EXTREMITIES: Warm and well perfused.  NEUROLOGIC: Patient is oriented x3 with no obvious focal neurologic deficits.  PSYCH: Patients affect is appropriate SKIN: Warm and dry; no lesions or wounds.      Device interrogation: HeartLogic score 0, activity level 0.8 hrs/day, mean HR 85, No AT/AF  ASSESSMENT & PLAN: 1. 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 No CAD  CAC = 0. - cMRI (3/23): LVEF 11%, RVEF 19%, large LV thrombus, criteria for LV non-compaction met, subendocardial LGE which can be seen in CAD and noncompaction, moderate pericardial effusion, moderate TR, mild MR - Echo (4/23) EF <20% moderate RV dysfunction large LV clot. 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 Lalla Brothers. - Stable NYHA II class.  - Currently on torsemide 40mg  daily; euvolemic on exam. Repeat labs today.  - Continue Entresto 97/103 mg bid. - Continue digoxin 0.125 mg daily. Check level today, has not had meds - Continue spironolactone 25 mg daily. - Continue Farxiga 10 mg daily. - Increase Toprol to 75mg  daily. Did not increase previously due to ED. Will prescribe viagra today so we can uptitrate beta blockers.  - Repeat echo today reviewed with him.  - We had a very lengthy discussion today regarding advanced therapies including transplant and BTT LVAD. I explained to him the importance of weight loss given his high risk of progression to Stage D HF at some point in his life.   2. 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 - Large LV thrombus on echo and cMRI.in 4/23 - Continue apixaban life long.  - No contrast used on TTE today.    3. Pericardial effusion - Moderate in size on cMRI. - Trivial on echo 4/23. - Repeat echo w/ no effusion.    4. History of PE s/p thrombectomy and thrombolysis with recurrent new PEs most recent admission - Continue Eliquis as above.   5. History of right posterior PCA territory infarcts (cardioembolic) - At high risk for recurrent events with large LV thrombus.and LVNC - Continue Eliquis and statin.  6. DM2 - Per PCP - Continue SGLT2i and GLP1RA.   7. Morbid obesity - Body mass index is 49.1 kg/m. - Continue Ozempic - Watch diet  - Discussed importance of weight loss extensively today.   8. Snoring - sleeps study 8/23 AHI 3.4  9. Erectile dysfunction - continue viagra 50mg  prn.   I spent *** minutes caring for this patient today including face to face time, ordering and  reviewing labs, reviewing records from ***, seeing the patient, documenting in the record, and arranging follow ups.    Neymar Dowe, DO  10:14 AM

## 2024-01-27 NOTE — Patient Instructions (Signed)
Medication Changes:  INCREASE METOPROLOL SUCCINATE TO 100MG  ONCE DAILY   Lab Work:  Go DOWN to LOWER LEVEL (LL) to have your blood work completed inside of [employer] office.  We will only call you if the results are abnormal or if the provider would like to make medication changes.  Referrals:  YOU HAVE BEEN REFERRED TO MEDICAL WEIGHT MANAGEMENT  THEY WILL REACH OUT TO YOU OR CALL TO ARRANGE THIS. PLEASE CALL us WITH ANY CONCERNS   Follow-Up in: 2 MONTHS AS SCHEDULED WITH DR. Gala Romney   If you have any questions or concerns before your next appointment please send [place] a message through mychart or call our office at [phone] Monday-Friday 8 am-5 pm.   If you have an urgent need after hours on the weekend please call your Primary Cardiologist or the Advanced Heart Failure Clinic in [place] at [phone].   At the Advanced Heart Failure Clinic, you and your health needs are our priority. We have a designated team specialized in the treatment of Heart Failure. This Care Team includes your primary Heart Failure Specialized Cardiologist (physician), Advanced Practice Providers (APPs- Physician Assistants and Nurse Practitioners), and Pharmacist who all work together to provide you with the care you need, when you need it.   You may see any of the following providers on your designated Care Team at your next follow up:  Dr. Arvilla Meres Dr. Marca Ancona Dr. Dorthula Nettles Dr. Theresia Bough Tonye Becket, NP Robbie Lis, [place] [address] [place], [place] Brynda Peon, NP [place] Lee, NP Clarisa Kindred, NP Enos Fling, PharmD

## 2024-01-30 ENCOUNTER — Ambulatory Visit: Payer: BLUE CROSS/BLUE SHIELD | Admitting: Physician Assistant

## 2024-01-31 ENCOUNTER — Ambulatory Visit: Payer: BLUE CROSS/BLUE SHIELD | Admitting: Physician Assistant

## 2024-01-31 ENCOUNTER — Encounter: Payer: Self-pay | Admitting: Physician Assistant

## 2024-01-31 DIAGNOSIS — I441 Atrioventricular block, second degree: Secondary | ICD-10-CM | POA: Insufficient documentation

## 2024-02-11 ENCOUNTER — Other Ambulatory Visit: Payer: Self-pay

## 2024-02-26 ENCOUNTER — Ambulatory Visit (INDEPENDENT_AMBULATORY_CARE_PROVIDER_SITE_OTHER): Payer: Medicaid Other

## 2024-02-26 DIAGNOSIS — I428 Other cardiomyopathies: Secondary | ICD-10-CM

## 2024-02-27 LAB — CUP PACEART REMOTE DEVICE CHECK
Battery Remaining Longevity: 138 mo
Battery Remaining Percentage: 100 %
Brady Statistic RA Percent Paced: 0 %
Brady Statistic RV Percent Paced: 0 %
Date Time Interrogation Session: 20250226043600
HighPow Impedance: 64 Ohm
Implantable Lead Connection Status: 753985
Implantable Lead Connection Status: 753985
Implantable Lead Implant Date: 20240227
Implantable Lead Implant Date: 20240227
Implantable Lead Location: 753859
Implantable Lead Location: 753860
Implantable Lead Model: 673
Implantable Lead Model: 7841
Implantable Lead Serial Number: 1407551
Implantable Lead Serial Number: 209112
Implantable Pulse Generator Implant Date: 20240227
Lead Channel Impedance Value: 364 Ohm
Lead Channel Impedance Value: 736 Ohm
Lead Channel Pacing Threshold Amplitude: 0.4 V
Lead Channel Pacing Threshold Amplitude: 1.4 V
Lead Channel Pacing Threshold Pulse Width: 0.4 ms
Lead Channel Pacing Threshold Pulse Width: 0.4 ms
Lead Channel Setting Pacing Amplitude: 2 V
Lead Channel Setting Pacing Amplitude: 2.5 V
Lead Channel Setting Pacing Pulse Width: 0.4 ms
Lead Channel Setting Sensing Sensitivity: 0.5 mV
Pulse Gen Serial Number: 652491
Zone Setting Status: 755011

## 2024-03-03 ENCOUNTER — Encounter: Payer: Self-pay | Admitting: Cardiology

## 2024-03-04 ENCOUNTER — Encounter (INDEPENDENT_AMBULATORY_CARE_PROVIDER_SITE_OTHER): Payer: Self-pay

## 2024-03-04 ENCOUNTER — Other Ambulatory Visit (HOSPITAL_COMMUNITY): Payer: Self-pay

## 2024-03-04 ENCOUNTER — Telehealth: Payer: Self-pay | Admitting: Pharmacist

## 2024-03-04 NOTE — Telephone Encounter (Signed)
 Pt confirmed appt for 03/05/24

## 2024-03-05 ENCOUNTER — Other Ambulatory Visit (HOSPITAL_COMMUNITY): Payer: Self-pay

## 2024-03-05 ENCOUNTER — Telehealth: Payer: Self-pay | Admitting: Pharmacist

## 2024-03-05 ENCOUNTER — Other Ambulatory Visit

## 2024-03-05 NOTE — Telephone Encounter (Signed)
 Patient did not show for appointment today. Prior authorization for Robert Mccann to primary insurance sent and approved (Key: [id number]). Prior authorization to secondary medicaid sent and denied (Key: [id number]). Appeal faxed today.

## 2024-03-05 NOTE — Progress Notes (Deleted)
 Advanced Heart Failure Clinic Note  PCP: Mick Sell, MD  PCP-Cardiologist: Debbe Odea, MD  HF-Cardiologist: Dr. Arvilla Meres  HPI:  Robert Mccann is a 28 y.o. 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   Admitted 6/22 to [facility] with PE. Underwent thrombectomy/thrombolysis of the right upper/middle/lower lobes. Dopplers negative for DVT. Echo EF <  20% with global hypokinesis and severely reduced RV function. 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    Admitted 11/22 with dysarthria and left hand numbness/weakness. Found to have a large acute posterior right MCA territory infarct. TEE did not show any LA/LAA/LV thrombus, and bubble study was negative.  He reported compliance with Eliquis, which was held in the setting of areas of petechial hemorrhage on follow-up CT.  Unable to complete coronary CTA due to inability to maintain adequate heart rate.   Admitted [facility] 3/23 with CP and SOB. CT chest with small LUL PE, RA clot. Echo EF <20% severe RV dysfunction large LV clot. Transferred to [facility] for further management. No R/LHC with RA and LV clot. cMRI with LVEF 11%, RVEF 19%, large LV thrombus, criteria for LV non-compaction met, subendocardial LGE which can be seen in CAD and noncompaction, moderate pericardial effusion, moderate TR, mild MR. Milrinone weaned and GDMT titrated, no beta blocker with wenckebach AV block. Not currently candidate for advanced therapies due to size and noncompliance. Discharged home, weight 309 lbs.   Follow up 4/23, Echo showed EF <20% moderate RV dysfunction large LV clot. Entresto increased and sleep study arranged.   Admitted 5/23 with rectal bleeding. Eliquis held. GI consulted, underwent colonoscopy showing significant bleeding from anal fissure. Had hemorrhoids, diverticulosis and 1 polyp that was  resected and clipped. Eliquis resumed before discharge.    Echo 8/23: EF 25-30% RV ok    Echo 12/23 EF 25-30%, LV with GHK, normal RV, LA mildly dilated, no MR    S/p boston scientific dual chamber ICD 02/25/22 by Dr. Lalla Brothers.   Last seen by CHF clinic on 08/28/23 where re reported missing medications x 4 days when he was on a cruise. Wait was up 15 pounds but returned to baseline 353 pounds after restarting his meds. No CP or SOB. Metoprolol was increased to 75 mg BID. He had been alternating between random furosemide doses (40,80,100,120). Regimen was simplified to torsemide 60 mg alternating with 40 mg every other day.  Visited urgent care on 09/14/23 for right shoulder pain. Discharged with tramadol.  Seen by Dr. Gasper Lloyd 11/21/23. Metoprolol was stopped by the patient due to impotence. Metoprolol succinate was added again at 75 mg daily alongside sildenafil prn.  Seen by Dr. Gasper Lloyd 01/27/24 where metoprolol was increased to 100 mg daily.  Today Robert Mccann returns to Heart Failure Clinic for pharmacist medication titration. Reports feeling dehydrated. Reports feeling mild fatigue and dehydration. Denies dizziness,lightheadedness, chest pain, palpitations, SOB, LEE, orthopnea, orthostasis, and PND. Reports being able to complete all activities of daily living (ADLs). 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 Is moderately active throughout the day. Goes to the gym 2 times per week. Weight at home is ~349 pounds. Takes torsemide 60 mg alternating with 40 mg daily. Appetite is reduced due to GLP-1RA.Marland Kitchen Somewhat follows a low sodium diet, but likes sports drinks.  Current HF Medications: -Metoprolol succinate 25 mg daily -Entresto 97-103 mg BID -Spironolactone 25 mg daily -Farxiga 10 mg daily -  Torsemide 60 mg alternating with 40 mg every other day -Other: digoxin 0.125 mg daily  Has the patient been experiencing any side effects to the medications prescribed? Yes. Experienced sexual  dysfunction after increasing metoprolol to 1.5 tablets daily. He self-reduced the dose back to 25 mg daily. Counseled to take the metoprolol right before bedtime to try to mitigate sexual dysfunction.   Does the patient have any problems obtaining medications due to transportation or finances? No  Understanding of regimen: Fair. Misunderstood his metpolol dosing and was taking 25 mg daily rather than 75 mg BID. Also misunderstood spironolactone dose and was taking 12.5 mg daily instead of 25 mg daily.  Understanding of indications: Good  Potential of adherence: Good  Patient understands to avoid NSAIDs.  Patient understands to avoid decongestants.  Pertinent Lab Values: Creatinine, Ser  Date Value Ref Range Status  01/27/2024 0.92 0.61 - 1.24 mg/dL Final   BUN  Date Value Ref Range Status  01/27/2024 11 6 - 20 mg/dL Final  16/10/9603 15 6 - 20 mg/dL Final   Potassium  Date Value Ref Range Status  01/27/2024 3.6 3.5 - 5.1 mmol/L Final   Sodium  Date Value Ref Range Status  01/27/2024 135 135 - 145 mmol/L Final  08/28/2023 142 134 - 144 mmol/L Final   B Natriuretic Peptide  Date Value Ref Range Status  01/27/2024 7.1 0.0 - 100.0 pg/mL Final    Comment:    Performed at [facility], [address] [place], [place] [address]   Magnesium  Date Value Ref Range Status  05/29/2023 2.0 1.7 - 2.4 mg/dL Final    Comment:    Performed at [facility], [address] [place], [place] [address]   Digoxin Level  Date Value Ref Range Status  05/03/2023 <0.2 (L) 0.8 - 2.0 ng/mL Final    Comment:    RESULT CONFIRMED BY MANUAL DILUTION Performed at [facility], [address], [place], [place] [address]    TSH  Date Value Ref Range Status  03/17/2022 5.035 (H) 0.350 - 4.500 uIU/mL Final    Comment:    Performed by a 3rd Generation assay with a functional sensitivity of <=0.01 uIU/mL. Performed at [facility], [address], [place],  [place] [address]     Vital Signs: There were no vitals filed for this visit.  Assessment/Plan: 1. 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 No CAD  CAC = 0. - cMRI (02/2022): LVEF 11%, RVEF 19%, large LV thrombus, criteria for LV non-compaction met, subendocardial LGE which can be seen in CAD and noncompaction, moderate pericardial effusion, moderate TR, mild MR - Echo (03/2022) EF <20% moderate RV dysfunction large LV clot. 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 Lalla Brothers. - Stable NYHA II - Volume looks ok on exam, but patient reports dehydration symptoms.  - Decrease Torsemide to 40 mg daily. BMP and BNP in 1 week. - Continue Entresto 97/103 mg bid. - Continue digoxin 0.125 mg daily. Check level today, has not had meds - Increase spironolactone to 25 mg daily as intended. BMP and BNP in 1 week. - Continue Farxiga 10 mg daily. - Continue  Toprol 25 at bedtime - Repeat echo scheduled for November.   2. 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 - Large LV thrombus on echo and cMRI.in 03/2022 - Continue Eliquis 5 mg bid lifelong   3. Pericardial effusion - Moderate in size on cMRI. - Trivial on echo 03/2022.   4. History of PE s/p thrombectomy and thrombolysis with recurrent new PEs most recent admission - Continue Eliquis as above.   5. History of right posterior PCA territory infarcts (cardioembolic) - At high risk for recurrent events with large LV thrombus.and LVNC - Continue Eliquis and statin.   6. DM2 - Per PCP - Continue SGLT2i and GLP1RA.   7. Morbid obesity - Body mass index is 49.65 kg/m. - Continue Ozempic. Tolerating max dose, but has seen a stall in  weight loss. Nutrition and exercise will help. - Meeting with nutritionist soon.   8. Snoring - Home sleep study performed, CPAP not indicated per patient report.  Follow up: with MD in November after echo  Please do not hesitate to reach out with questions or concerns,  Enos Fling, PharmD, BCPS Heart Failure Pharmacist Phone - [phone] 03/05/2024 10:58 AM

## 2024-03-12 ENCOUNTER — Telehealth: Payer: Self-pay | Admitting: Physician Assistant

## 2024-03-12 ENCOUNTER — Ambulatory Visit: Payer: BLUE CROSS/BLUE SHIELD | Admitting: Physician Assistant

## 2024-03-12 ENCOUNTER — Telehealth: Payer: Self-pay

## 2024-03-12 ENCOUNTER — Telehealth: Payer: Self-pay | Admitting: Internal Medicine

## 2024-03-12 ENCOUNTER — Encounter: Payer: Self-pay | Admitting: Physician Assistant

## 2024-03-12 ENCOUNTER — Other Ambulatory Visit: Payer: Self-pay

## 2024-03-12 VITALS — BP 124/86 | HR 84 | Temp 98.4°F | Ht 72.0 in | Wt 356.0 lb

## 2024-03-12 DIAGNOSIS — E1169 Type 2 diabetes mellitus with other specified complication: Secondary | ICD-10-CM | POA: Diagnosis not present

## 2024-03-12 DIAGNOSIS — Z7985 Long-term (current) use of injectable non-insulin antidiabetic drugs: Secondary | ICD-10-CM

## 2024-03-12 DIAGNOSIS — I428 Other cardiomyopathies: Secondary | ICD-10-CM | POA: Diagnosis not present

## 2024-03-12 DIAGNOSIS — E66813 Obesity, class 3: Secondary | ICD-10-CM

## 2024-03-12 DIAGNOSIS — Z3189 Encounter for other procreative management: Secondary | ICD-10-CM | POA: Diagnosis not present

## 2024-03-12 DIAGNOSIS — Z6841 Body Mass Index (BMI) 40.0 and over, adult: Secondary | ICD-10-CM

## 2024-03-12 LAB — POCT GLYCOSYLATED HEMOGLOBIN (HGB A1C): Hemoglobin A1C: 7.5 % — AB (ref 4.0–5.6)

## 2024-03-12 MED ORDER — TIRZEPATIDE 7.5 MG/0.5ML ~~LOC~~ SOAJ
7.5000 mg | SUBCUTANEOUS | 0 refills | Status: DC
  Filled 2024-03-12 – 2024-03-18 (×4): qty 2, 28d supply, fill #0

## 2024-03-12 NOTE — Telephone Encounter (Signed)
 PA completed  KP

## 2024-03-12 NOTE — Assessment & Plan Note (Signed)
 Continue specialist follow-up as planned

## 2024-03-12 NOTE — Telephone Encounter (Signed)
 PA completed waiting on insurance approval.  Key: [id number]  [id number]

## 2024-03-12 NOTE — Telephone Encounter (Signed)
 Denied. Per the health plan preferred drug list, at least 2 preferred drugs must be tried before requesting this drug or tell us why the member cannot try any preferred alternatives. Please send [place] supporting chart notes and lab results. Here is list of preferred alternatives: Byetta Pen, Trulicity Pen, Victoza Pen, Ozempic Pen. Note: Some preferred drug(s) may have quantity limits. Refer to the health plan's preferred drug list for additional details. Additionally, per the health plan guideline, GLP-1 Receptor Agonists and Combinations, the clinical criteria below must be met before this request can be approved. Please send [place] supporting chart notes and lab results. 1. In addition, for non-preferred products, the beneficiary must have tried and failed or experienced an insufficient response to at least two preferred products or have a clinical reason that preferred products cannot be tried.  Only the Requesting Provider may call the Medical Director who made this decision at  [phone] to schedule a Peer-to-Peer Review.

## 2024-03-12 NOTE — Telephone Encounter (Signed)
 Called the number. Was told this was not the only prior auth for [facility] - one was placed last week too. It looks like the HF clinic put in for prior auth on [employer]ounjar[employer] which was approved under BCBS but denied by Medicaid. Either way, he is approved. I have advised he follow up with the prescriber - he has appt tomorrow.

## 2024-03-12 NOTE — Progress Notes (Signed)
 Date:  03/12/2024   Name:  Robert Mccann   DOB:  [birth year]   MRN:  [id number]   Chief Complaint: Establish Care (Dx with cell mutation ) and Diabetes (Wants to switch from ozempic to mounjaro)  HPI Robert Mccann is a pleasant but medically complex 28 y.o. male who presents new to the clinic today to establish care. 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 Follows with cardiology regularly.   His main concern is losing weight. Currently on Ozempic 2 mg weekly and interested in switching to [facility].  Last A1c 7.2%.  He has previously used Mounjaro, but it sounds like he was switched back to Ozempic due to more literature support for cardiac indications.  He also reports he has some type of genetic mutation from his mother, but does not know what it is called or what impact it has on his health.  Apparently he has been tested for this.  Says he thinks it might have to do with the Y chromosome but cannot give me further details.  Nothing to this effect found in care everywhere. He had much hematology testing in 2022 and 2023, negative for Factor V Leiden among other tests which I am not familiar with.   Interested in fertility consult.  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 Says he needs a [place] fertility specialist per insurance.    Medication list has been reviewed and updated.  Current Meds  Medication Sig   apixaban (ELIQUIS) 5 MG TABS tablet Take 1 tablet (5 mg total) by mouth 2 (two) times daily.   dapagliflozin propanediol (FARXIGA) 10 MG TABS tablet Take 1 tablet (10 mg total) by mouth daily.   digoxin (LANOXIN) 0.125 MG tablet Take 1 tablet (125 mcg total) by mouth daily.   ezetimibe (ZETIA) 10 MG tablet Take 1 tablet (10 mg total) by mouth daily.   Fluocinolone  Acetonide Body 0.01 % OIL Apply to scalp, face, beard up to 2 times a day as needed for scaling, itching, redness.   furosemide (LASIX) 40 MG tablet Take by mouth.   ketoconazole (NIZORAL) 2 % shampoo Apply topically.   metFORMIN (GLUCOPHAGE) 500 MG tablet Take 1 tablet (500 mg total) by mouth 2 (two) times daily with a meal.   metoprolol succinate (TOPROL-XL) 100 MG 24 hr tablet Take 1 tablet (100 mg total) by mouth daily.   potassium chloride (KLOR-CON) 20 MEQ packet Dissolve and take 60 mEq (3 packets) by mouth daily. (Patient taking differently: Take 60 mEq by mouth as needed.)   rosuvastatin (CRESTOR) 40 MG tablet Take 1 tablet (40 mg total) by mouth daily.   sacubitril-valsartan (ENTRESTO) 97-103 MG Take 1 tablet by mouth 2 (two) times daily.   Semaglutide, 2 MG/DOSE, (OZEMPIC, 2 MG/DOSE,) 8 MG/3ML SOPN Inject 2 mg into the skin once a week.   sildenafil (VIAGRA) 50 MG tablet Take 1 tablet (50 mg total) by mouth daily as needed for erectile dysfunction.   spironolactone (ALDACTONE) 25 MG tablet Take 1 tablet (25 mg total) by mouth daily.   tirzepatide (MOUNJARO) 7.5 MG/0.5ML Pen Inject 7.5 mg into the skin once a week.   torsemide (DEMADEX) 20 MG tablet Take 2 tablets (40 mg total) by mouth every other day alternating with 3 tablets (60 mg total) every other day. (Patient  taking differently: Take 40 mg by mouth every other day. Alternates 40mg   and 60mg )   [DISCONTINUED] dapagliflozin propanediol (FARXIGA) 10 MG TABS tablet Take 1 tablet (10 mg total) by mouth daily.   [DISCONTINUED] melatonin 5 MG TABS Take 5 mg by mouth at bedtime as needed.   [DISCONTINUED] spironolactone (ALDACTONE) 25 MG tablet Take 1 tablet (25 mg total) by mouth daily.     Review of Systems  Patient Active Problem List   Diagnosis Date Noted   Second degree Mobitz II AV block (intermittent) 01/31/2024   ICD (implantable cardioverter-defibrillator) in place 02/25/2023   NICM (nonischemic cardiomyopathy) (HCC)     Chronic HFrEF (heart failure with reduced ejection fraction) (HCC)    Essential hypertension 03/21/2022   History of non-ST elevation myocardial infarction (NSTEMI) 03/13/2022   Hyperlipidemia associated with type 2 diabetes mellitus (HCC) 03/13/2022   History of CVA (cerebrovascular accident) 11/05/2021   History of pulmonary embolus (PE) 06/28/2021   Class 3 severe obesity with serious comorbidity and body mass index (BMI) of 45.0 to 49.9 in adult [facility])    Type 2 diabetes mellitus with other specified complication (HCC) 06/27/2021    No Known Allergies  Immunization History  Administered Date(s) Administered   Influenza Inj Mdck Quad Pf 09/25/2020   Influenza,inj,Quad PF,6+ Mos 10/06/2019   Influenza-Unspecified 09/25/2020, 10/02/2022, 08/15/2023   PPD Test 09/23/2020, 01/27/2022    Past Surgical History:  Procedure Laterality Date   COLONOSCOPY N/A 05/18/2022   Procedure: COLONOSCOPY;  Surgeon: [name], MD;  Location: [facility] ENDOSCOPY;  Service: Gastroenterology;  Laterality: N/A;   HAND SURGERY Left age 23   HAND SURGERY     HEMOSTASIS CLIP PLACEMENT  05/18/2022   Procedure: HEMOSTASIS CLIP PLACEMENT;  Surgeon: [name], MD;  Location: [facility] ENDOSCOPY;  Service: Gastroenterology;;   ICD IMPLANT N/A 02/25/2023   Procedure: ICD IMPLANT;  Surgeon: Lanier Prude, MD;  Location: [facility] INVASIVE CV LAB;  Service: Cardiovascular;  Laterality: N/A;   POLYPECTOMY  05/18/2022   Procedure: POLYPECTOMY;  Surgeon: [name], MD;  Location: [facility] ENDOSCOPY;  Service: Gastroenterology;;   PULMONARY THROMBECTOMY N/A 06/28/2021   Procedure: PULMONARY THROMBECTOMY;  Surgeon: Annice Needy, MD;  Location: [facility] INVASIVE CV LAB;  Service: Cardiovascular;  Laterality: N/A;   TEE WITHOUT CARDIOVERSION N/A 11/08/2021   Procedure: TRANSESOPHAGEAL ECHOCARDIOGRAM (TEE);  Surgeon: Debbe Odea, MD;  Location: [facility] ORS;  Service: Cardiovascular;  Laterality: N/A;    Social History   Tobacco  Use   Smoking status: Never   Smokeless tobacco: Never  Vaping Use   Vaping status: Never Used  Substance Use Topics   Alcohol use: Not Currently   Drug use: Not Currently    Types: Marijuana    Family History  Problem Relation Age of Onset   Stroke Mother    Diabetes Mother    Heart disease Mother    Heart failure Mother    Cerebrovascular Accident Mother    Diabetes Father    Hypertension Father    Heart disease Maternal Uncle    Diabetes Paternal Uncle    Heart disease Maternal Grandmother    Diabetes Maternal Grandfather    Hypertension Paternal Grandmother    Diabetes Paternal Grandfather    Cancer Paternal Grandfather    Cancer Cousin        unknown origin   Cancer Cousin        unknown origing        03/12/2024    9:52  AM 01/05/2022   10:22 AM  GAD 7 : Generalized Anxiety Score  Nervous, Anxious, on Edge 0 0  Control/stop worrying 0 0  Worry too much - different things 0 0  Trouble relaxing 0 0  Restless 0 0  Easily annoyed or irritable 0 0  Afraid - awful might happen 0 0  Total GAD 7 Score 0 0  Anxiety Difficulty Not difficult at all Not difficult at all       03/12/2024    9:51 AM 08/01/2022   11:14 AM 04/19/2022    1:48 PM  Depression screen PHQ 2/9  Decreased Interest 0 0 0  Down, Depressed, Hopeless 0 0 0  PHQ - 2 Score 0 0 0  Altered sleeping  1 0  Tired, decreased energy  1 1  Change in appetite  1 1  Feeling bad or failure about yourself   0 0  Trouble concentrating  0 0  Moving slowly or fidgety/restless  0 1  Suicidal thoughts  0 0  PHQ-9 Score  3 3  Difficult doing work/chores  Somewhat difficult Not difficult at all    BP Readings from Last 3 Encounters:  03/12/24 124/86  01/27/24 124/87  11/26/23 131/88    Wt Readings from Last 3 Encounters:  03/12/24 (!) 356 lb (161.5 kg)  01/27/24 (!) 362 lb (164.2 kg)  11/26/23 (!) 361 lb 6.4 oz (163.9 kg)    BP 124/86   Pulse 84   Temp 98.4 F (36.9 C)   Ht 6' (1.829 m)   Wt (!)  356 lb (161.5 kg)   SpO2 96%   BMI 48.28 kg/m   Physical Exam Vitals and nursing note reviewed.  Constitutional:      Appearance: Normal appearance. He is obese.  Cardiovascular:     Rate and Rhythm: Normal rate.  Pulmonary:     Effort: Pulmonary effort is normal.  Abdominal:     General: There is no distension.  Musculoskeletal:        General: Normal range of motion.  Skin:    General: Skin is warm and dry.  Neurological:     Mental Status: He is alert and oriented to person, place, and time.     Gait: Gait is intact.  Psychiatric:        Mood and Affect: Mood and affect normal.     Recent Labs     Component Value Date/Time   NA 135 01/27/2024 1127   NA 142 08/28/2023 1137   K 3.6 01/27/2024 1127   CL 98 01/27/2024 1127   CO2 25 01/27/2024 1127   GLUCOSE 137 (H) 01/27/2024 1127   BUN 11 01/27/2024 1127   BUN 15 08/28/2023 1137   CREATININE 0.92 01/27/2024 1127   CALCIUM 9.0 01/27/2024 1127   PROT 7.7 06/12/2022 1140   ALBUMIN 4.5 06/12/2022 1140   AST 54 (H) 06/12/2022 1140   ALT 38 06/12/2022 1140   ALKPHOS 102 06/12/2022 1140   BILITOT 0.5 06/12/2022 1140   GFRNONAA >60 01/27/2024 1127    Lab Results  Component Value Date   WBC 5.7 08/28/2023   HGB 15.4 08/28/2023   HCT 45.0 08/28/2023   MCV 88 08/28/2023   PLT 375 08/28/2023   Lab Results  Component Value Date   HGBA1C 7.5 (A) 03/12/2024   Lab Results  Component Value Date   CHOL 98 (L) 06/12/2022   HDL 38 (L) 06/12/2022   LDLCALC 39 06/12/2022   TRIG 116 06/12/2022  CHOLHDL 2.6 06/12/2022   Lab Results  Component Value Date   TSH 5.035 (H) 03/17/2022   Lab Results  Component Value Date   HGBA1C 7.5 (A) 03/12/2024   HGBA1C 7.2 (A) 05/23/2023   HGBA1C 8.9 (H) 03/13/2022     Assessment and Plan:  Type 2 diabetes mellitus with other specified complication, without long-term current use of insulin (HCC) Assessment & Plan: A1c 7.5% today, slightly increased from previous A1c of  7.2%.  Would benefit from more routine A1c checks.  Overall, glycemic control seems fair but reviewed A1c goal of less than 7.0% with patient.  He does have room for improvement when it comes to diet, stating he likes sweets.  Recommend diet low in simple carbohydrates such as white starches (bread, pasta, rice) and refined sugar found in desserts and sweetened beverages including juice, sweet tea, and soda.   Referring to dietician.  We will also be attempting a switch from Ozempic to [facility] if insurance will authorize. While there is less available evidence for cardiac outcomes for Mounjaro, I do feel like the potency would prove useful to mitigate his KNOWN cardiac risk factors, namely obesity and DM2. If approved, we will begin at [facility] 7.5 mg (Step 3) and gauge efficacy after 1 month.  Advised the importance of adequate protein intake on this medication as well as resistance training which will not only improve insulin sensitivity but also build muscle mass and reduce risk of atrophy.  Advised importance of portion control and slower eating on this medication to minimize GI side effects. Avoid high fat foods as these may cause discomfort. Patient aware of potential for weight regain when eventually stopping the medication. For this reason, continued efforts toward improving lifestyle will be particularly important moving forward.    Orders: -     POCT glycosylated hemoglobin (Hb A1C) -     Tirzepatide; Inject 7.5 mg into the skin once a week.  Dispense: 2 mL; Refill: 0 -     Amb ref to Medical Nutrition Therapy-MNT  Class 3 severe obesity due to excess calories with serious comorbidity and body mass index (BMI) of 45.0 to 49.9 in adult [facility]) Assessment & Plan: Encouraged drinking mainly water, flavoring naturally with lemon, cucumber, berries etc or with artificial sweetener if needed for convenience, though excess artificial sweetener is not ideal either.   Recommend routine physical  activity which can start small (10 min daily walk) with eventual goal of 150 min of heart rate elevation per week. Examples could include brisk walk, stationary bike, etc. At least 3x per week, I recommend some type of resistance training which could be weights, resistance bands, or body weight exercises - this would count toward the weekly exercise goal.    Orders: -     Amb ref to Medical Nutrition Therapy-MNT  NICM (nonischemic cardiomyopathy) [facility]) Assessment & Plan: Continue specialist follow-up as planned   Encounter for fertility planning  Patient advised that I am not aware of any [place] fertility providers.  That said, he can call around to various fertility clinics and ask if they accept [place] Medicaid; would be happy to refer at any time if he can give me the information.  Additionally, it would be well worthwhile for him to find out whatever genetic condition/cell mutation he has, as this would be useful for his overall clinical picture as well as any fertility planning.  Might even consider genetic counseling prior to seeing any fertility specialists.   Return in  about 4 weeks (around 04/09/2024) for OV f/u chronic conditions.   Today's visit billed for provider time of 61 minutes inclusive of thorough chart review, history taking, physical exam, discussion of chronic conditions, medication management, specialist referral.  Alvester Morin, PA-C, DMSc, Nutritionist [facility] [phone]

## 2024-03-12 NOTE — Assessment & Plan Note (Addendum)
 A1c 7.5% today, slightly increased from previous A1c of 7.2%.  Would benefit from more routine A1c checks.  Overall, glycemic control seems fair but reviewed A1c goal of less than 7.0% with patient.  He does have room for improvement when it comes to diet, stating he likes sweets.  Recommend diet low in simple carbohydrates such as white starches (bread, pasta, rice) and refined sugar found in desserts and sweetened beverages including juice, sweet tea, and soda.   Referring to dietician.  We will also be attempting a switch from Ozempic to [facility] if insurance will authorize. While there is less available evidence for cardiac outcomes for Mounjaro, I do feel like the potency would prove useful to mitigate his KNOWN cardiac risk factors, namely obesity and DM2. If approved, we will begin at [facility] 7.5 mg (Step 3) and gauge efficacy after 1 month.  Advised the importance of adequate protein intake on this medication as well as resistance training which will not only improve insulin sensitivity but also build muscle mass and reduce risk of atrophy.  Advised importance of portion control and slower eating on this medication to minimize GI side effects. Avoid high fat foods as these may cause discomfort. Patient aware of potential for weight regain when eventually stopping the medication. For this reason, continued efforts toward improving lifestyle will be particularly important moving forward.

## 2024-03-12 NOTE — Assessment & Plan Note (Signed)
 Encouraged drinking mainly water, flavoring naturally with lemon, cucumber, berries etc or with artificial sweetener if needed for convenience, though excess artificial sweetener is not ideal either.   Recommend routine physical activity which can start small (10 min daily walk) with eventual goal of 150 min of heart rate elevation per week. Examples could include brisk walk, stationary bike, etc. At least 3x per week, I recommend some type of resistance training which could be weights, resistance bands, or body weight exercises - this would count toward the weekly exercise goal.

## 2024-03-12 NOTE — Telephone Encounter (Signed)
 Prior Joylene Draft for diabetes

## 2024-03-12 NOTE — Telephone Encounter (Signed)
 Please review.  KP

## 2024-03-12 NOTE — Telephone Encounter (Signed)
 Pt confirmed appt on 03/13/24

## 2024-03-13 ENCOUNTER — Ambulatory Visit: Payer: BLUE CROSS/BLUE SHIELD | Attending: Internal Medicine | Admitting: Internal Medicine

## 2024-03-13 ENCOUNTER — Other Ambulatory Visit: Payer: Self-pay

## 2024-03-13 VITALS — BP 123/87 | HR 82 | Wt 359.0 lb

## 2024-03-13 DIAGNOSIS — I3139 Other pericardial effusion (noninflammatory): Secondary | ICD-10-CM | POA: Diagnosis not present

## 2024-03-13 DIAGNOSIS — I10 Essential (primary) hypertension: Secondary | ICD-10-CM | POA: Diagnosis not present

## 2024-03-13 DIAGNOSIS — I5022 Chronic systolic (congestive) heart failure: Secondary | ICD-10-CM | POA: Diagnosis not present

## 2024-03-13 NOTE — Patient Instructions (Addendum)
 Medication Changes:  STOP DIGOXIN  Lab Work:  Go DOWN to LOWER LEVEL (LL) to have your blood work completed inside of [employer] office.  We will only call you if the results are abnormal or if the provider would like to make medication changes.   Testing/Procedures:  Please have your echo completed on Monday, April 21st at 9:00 AM. You will check in for this at the [facility]. You have to arrive 15 MINS EARLY for preparation, otherwise you will have to reschedule.   Referrals:  We have placed a referral today to Genetic Counseling. They should reach out to you within 1-2 weeks. If they do not, please reach out to them. Their information is below on this AVS.   Follow-Up in: 3 months with Dr. Gala Romney.  Our Doctors' schedules are NOT open yet for 3 months. We will place you on our recall list. Once they are available, we will call you to schedule your follow up appointment.   At the [facility], you and your health needs are our priority. We have a designated team specialized in the treatment of Heart Failure. This Care Team includes your primary Heart Failure Specialized Cardiologist (physician), Advanced Practice Providers (APPs- Physician Assistants and Nurse Practitioners), and Pharmacist who all work together to provide you with the care you need, when you need it.   You may see any of the following providers on your designated Care Team at your next follow up:  Dr. Arvilla Meres Dr. Marca Ancona Dr. Dorthula Nettles Dr. Theresia Bough Clarisa Kindred, FNP Enos Fling, RPH-CPP  Please be sure to bring in all your medications bottles to every appointment.   Need to Contact us:  If you have any questions or concerns before your next appointment please send [place] a message through [place] or call our office at [phone].    TO LEAVE A MESSAGE FOR THE NURSE SELECT OPTION 2, PLEASE LEAVE A MESSAGE INCLUDING: YOUR NAME DATE OF BIRTH CALL BACK  NUMBER REASON FOR CALL**this is important as we prioritize the call backs  YOU WILL RECEIVE A CALL BACK THE SAME DAY AS LONG AS YOU CALL BEFORE 4:00 PM

## 2024-03-13 NOTE — Progress Notes (Signed)
 Advanced Heart Failure Clinic Note  Primary Care: Remo Lipps, [place] General Cardiologist: Dr. Azucena Cecil HF Cardiologist: Dr. Gala Romney  CC: Stage C/D Biventricular failure  HPI: Robert Mccann is a 28 y.o. 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  Admitted 6/22 to [facility] with PE. Underwent thrombectomy/thrombolysis of the right upper/middle/lower lobes. Dopplers negative for DVT. Echo EF <  20% with global hypokinesis and severely reduced RV function. 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    Admitted 11/22 with dysarthria and left hand numbness/weakness. Found to have a large acute posterior right MCA territory infarct. TEE did not show any LA/LAA/LV thrombus, and bubble study was negative.  He reported compliance with Eliquis, which was held in the setting of areas of petechial hemorrhage on follow-up CT.  Unable to complete coronary CTA due to inability to maintain adequate heart rate.   Admitted [facility] 3/23 with CP and SOB. CT chest with small LUL PE, RA clot. Echo EF <20% severe RV dysfunction large LV clot. Transferred to [facility] for further management. No R/LHC with RA and LV clot. cMRI with LVEF 11%, RVEF 19%, large LV thrombus, criteria for LV non-compaction met, subendocardial LGE which can be seen in CAD and noncompaction, moderate pericardial effusion, moderate TR, mild MR. Milrinone weaned and GDMT titrated, no beta blocker with wenckebach AV block. Not currently candidate for advanced therapies due to size and noncompliance. Discharged home, weight 309 lbs.  Follow up 4/23, Echo showed EF <20% moderate RV dysfunction large LV clot. Entresto increased and sleep study arranged.  Admitted 5/23 with rectal bleeding. Eliquis held. GI consulted, underwent colonoscopy showing significant bleeding from anal fissure. Had hemorrhoids,  diverticulosis and 1 polyp that was resected and clipped. Eliquis resumed before discharge.   Echo 8/23: EF 25-30% RV ok   Echo 12/23 EF 25-30%, LV with GHK, normal RV, LA mildly dilated, no MR   S/p boston scientific dual chamber ICD 02/25/22 by Dr. Lalla Brothers.  Here for f/u with his wife. Says he feels good. "I have no issues"  Trying to get back to working. Going to school for HVAC. No CP or SOB. No edema, orthopnea or PND. Off Ozempic x 2 weeks  Cardiac Studies: - Echo 11/21/23: EF 30% with global hypokinesis.  - Echo 12/23 EF 25-30%, LV with GHK, normal RV, LA mildly dilated, no MR  - Echo 8/23: EF 25-30% RV ok  - Sleep study 8/23 AHI 3.4  - Echo (4/23): EF < 20%, moderate RV dysfunction with large LV clot. - cMRI (3/23): with LVEF 11%, RVEF 19%, large LV thrombus, criteria for LV non-compaction met, subendocardial LGE which can be seen in CAD and noncompaction, moderate pericardial effusion, moderate TR, mild MR.  - Echo (3/23): EF <20% severe RV dysfunction large LV clot. - TEE (11/22): EF 20-25%, global HK LV, severe RV dysfunction, no clot, bubble study negative. - Echo (6/22): EF < 20% with global hypokinesis and severely reduced RV function.   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 05/2021 noted during periods of sleep @ time of hospitalization for PE.   Cardiomyopathy (HCC)    a. 05/2021 Echo: EF <20%, glob HK. [place]:C ratio of 2.1:1 suggesting non-compaction. Gr2 DD. Sev reduced RV fxn. Nl PASP. Mildly dil RA. Mild MR; b. 10/2021 Echo: EF <20%, GrII DD; c. 10/2021 TEE: EF  20-25%, glob HK, sev red RV fxn.   Diabetes mellitus without complication (HCC)    Erythrocytosis 05/30/2022   HFrEF (heart failure with reduced ejection fraction) (HCC)    a. 05/2021 Echo: EF <20%. ? non-compaction.   Hyperlipidemia    Hypertension    LV (left ventricular) mural thrombus    Pulmonary embolism (HCC)    a. 05/2021 CTA Chest: PE extending from lobar arteris of RUL and  RLL w/ concern for pulml infarct-->s/p thrombolysis and thrombectomy of RUL/RML/RLL.   Stroke [facility])    a. 10/2021 CT Head: Bilat cerebral infarcts w/ mod-sided acute to subacute R temporoparietal infarct; b. 10/2021 MRI: R post PCA territory infarcts w/o hemorrhage; c. 10/2021 TEE: Neg bubble study. No LA/LAA thrombus. EF 20-25%, glob HK.   Current Outpatient Medications  Medication Sig Dispense Refill   apixaban (ELIQUIS) 5 MG TABS tablet Take 1 tablet (5 mg total) by mouth 2 (two) times daily. 180 tablet 3   Blood Glucose Monitoring Suppl (BLOOD GLUCOSE MONITOR SYSTEM) w/Device KIT Use up to four times daily as directed. 1 kit 3   dapagliflozin propanediol (FARXIGA) 10 MG TABS tablet Take 1 tablet (10 mg total) by mouth daily. 90 tablet 0   digoxin (LANOXIN) 0.125 MG tablet Take 1 tablet (125 mcg total) by mouth daily. 90 tablet 1   ezetimibe (ZETIA) 10 MG tablet Take 1 tablet (10 mg total) by mouth daily. 90 tablet 2   Fluocinolone Acetonide Body 0.01 % OIL Apply to scalp, face, beard up to 2 times a day as needed for scaling, itching, redness.     furosemide (LASIX) 40 MG tablet Take by mouth.     ketoconazole (NIZORAL) 2 % shampoo Apply topically.     metFORMIN (GLUCOPHAGE) 500 MG tablet Take 1 tablet (500 mg total) by mouth 2 (two) times daily with a meal. 180 tablet 1   metoprolol succinate (TOPROL-XL) 100 MG 24 hr tablet Take 1 tablet (100 mg total) by mouth daily. 30 tablet 3   potassium chloride (KLOR-CON) 20 MEQ packet Dissolve and take 60 mEq (3 packets) by mouth daily. (Patient taking differently: Take 60 mEq by mouth as needed.) 90 packet 1   rosuvastatin (CRESTOR) 40 MG tablet Take 1 tablet (40 mg total) by mouth daily. 90 tablet 1   sacubitril-valsartan (ENTRESTO) 97-103 MG Take 1 tablet by mouth 2 (two) times daily. 180 tablet 1   Semaglutide, 2 MG/DOSE, (OZEMPIC, 2 MG/DOSE,) 8 MG/3ML SOPN Inject 2 mg into the skin once a week. 3 mL 3   sildenafil (VIAGRA) 50 MG tablet Take 1  tablet (50 mg total) by mouth daily as needed for erectile dysfunction. 10 tablet 0   spironolactone (ALDACTONE) 25 MG tablet Take 1 tablet (25 mg total) by mouth daily. 90 tablet 0   tirzepatide (MOUNJARO) 7.5 MG/0.5ML Pen Inject 7.5 mg into the skin once a week. 2 mL 0   torsemide (DEMADEX) 20 MG tablet Take 2 tablets (40 mg total) by mouth every other day alternating with 3 tablets (60 mg total) every other day. (Patient taking differently: Take 40 mg by mouth every other day. Alternates 40mg   and 60mg ) 140 tablet 1   No current facility-administered medications for this visit.   No Known Allergies  Social History   Socioeconomic History   Marital status: Single    Spouse name: Not on file   Number of children: 0   Years of education: Not on file   Highest education level: Not on  file  Occupational History   Not on file  Tobacco Use   Smoking status: Never   Smokeless tobacco: Never  Vaping Use   Vaping status: Never Used  Substance and Sexual Activity   Alcohol use: Not Currently   Drug use: Not Currently    Types: Marijuana   Sexual activity: Yes    Partners: Female  Other Topics Concern   Not on file  Social History Narrative   Smokes weed; No smoking cigarettes; no alcohol. 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 Lives in [place].  NO children.    Social Drivers of Corporate investment banker Strain: Not on file  Food Insecurity: No Food Insecurity (03/12/2024)   Hunger Vital Sign    Worried About Running Out of Food in the Last Year: Never true    Ran Out of Food in the Last Year: Never true  Transportation Needs: No Transportation Needs (03/12/2024)   PRAPARE - Administrator, Civil Service (Medical): No    Lack of Transportation (Non-Medical): No  Physical Activity: Not on file  Stress: Not on file  Social Connections: Not on file  Intimate Partner Violence: Not At Risk (03/12/2024)   Humiliation, Afraid, Rape, and Kick questionnaire     Fear of Current or Ex-Partner: No    Emotionally Abused: No    Physically Abused: No    Sexually Abused: No   Family History  Problem Relation Age of Onset   Stroke Mother    Diabetes Mother    Heart disease Mother    Heart failure Mother    Cerebrovascular Accident Mother    Diabetes Father    Hypertension Father    Heart disease Maternal Uncle    Diabetes Paternal Uncle    Heart disease Maternal Grandmother    Diabetes Maternal Grandfather    Hypertension Paternal Grandmother    Diabetes Paternal Grandfather    Cancer Paternal Grandfather    Cancer Cousin        unknown origin   Cancer Cousin        unknown origing   BP 123/87   Pulse 82   Wt (!) 359 lb (162.8 kg)   SpO2 98%   BMI 48.69 kg/m   Wt Readings from Last 3 Encounters:  03/13/24 (!) 359 lb (162.8 kg)  03/12/24 (!) 356 lb (161.5 kg)  01/27/24 (!) 362 lb (164.2 kg)   PHYSICAL EXAM: Vitals:   03/13/24 1027 03/13/24 1050  BP: (!) 140/114 123/87  Pulse: 82   SpO2: 98%    General:  Well appearing. No resp difficulty HEENT: normal Neck: supple. no JVD. Carotids 2+ bilat; no bruits. No lymphadenopathy or thryomegaly appreciated. Cor: PMI nondisplaced. Regular rate & rhythm. No rubs, gallops or murmurs. Lungs: clear Abdomen: obese soft, nontender, nondistended. No hepatosplenomegaly. No bruits or masses. Good bowel sounds. Extremities: no cyanosis, clubbing, rash, edema Neuro: alert & orientedx3, cranial nerves grossly intact. moves all 4 extremities w/o difficulty. Affect pleasant    Device interrogation: HeartLogic score 0, activity level 0.8 hrs/day, mean HR 85, No AT/AF  ASSESSMENT & PLAN: 1. 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 No CAD  CAC = 0. - cMRI (3/23): LVEF 11%, RVEF 19%, large LV thrombus, criteria for LV non-compaction met, subendocardial LGE which can be seen in CAD and  noncompaction, moderate pericardial effusion, moderate TR, mild MR -  Echo (4/23) EF <20% moderate RV dysfunction large LV clot. - Echo 8/23: EF 25-30% RV ok  - Echo 12/23 EF 25-30%, LV with GHK, normal RV, LA mildly dilated, no MR  - Echo 11/21/23: EF 25%-30% - Genetic testing with 2 genes of uncertain significance. His mother tested postive for MYH7 genetic mutation.  - S/p boston scientific dual chamber ICD 02/25/22 by Dr. Lalla Brothers. - Stable NYHA II - Currently on torsemide 40mg  daily; volume status ok - Continue Entresto 97/103 mg bid. - Can stop digoxin  - Continue spironolactone 25 mg daily. - Continue Farxiga 10 mg daily. - Continue toprol 100mg  daily - Repeat echo and labs - We once again had an extensive discussion on the importance of weight loss with regards to transplant. I suspect he will eventually have Stage D, NYHA IV HF given that his mother also has MYH7 genetic cardiomyopathy. Stable for now - They are thinking of having kids. Will send to [facility]   2. 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 - Large LV thrombus on echo and cMRI.in 4/23 - Continue apixaban life long while EF down - Repeat echo    3. Pericardial effusion - Moderate in size on cMRI. - Trivial on echo 4/23. - Resolved   4. History of PE s/p thrombectomy and thrombolysis with recurrent new PEs most recent admission - Continue Eliquis as above.   5. History of right posterior PCA territory infarcts (cardioembolic) - At high risk for recurrent events with large LV thrombus.and LVNC - Continue Eliquis and statin.  6. DM2 - Per PCP - Last A1 7,5 - Continue SGLT2i and GLP1RA.   7. Morbid obesity - Body mass index is 48.69 kg/m. - Hasn't taken it in 2 weeks. Encourged him to restart  - Extensively discussed importance of weight loss.    8. Snoring - sleep study 8/23 AHI 3.4    Arvilla Meres, MD  10:51 AM

## 2024-03-14 LAB — LIPID PANEL
Chol/HDL Ratio: 3.1 ratio (ref 0.0–5.0)
Cholesterol, Total: 131 mg/dL (ref 100–199)
HDL: 42 mg/dL (ref >39)
LDL Chol Calc (NIH): 67 mg/dL (ref 0–99)
Triglycerides: 124 mg/dL (ref 0–149)
VLDL Cholesterol Cal: 22 mg/dL (ref 5–40)

## 2024-03-14 LAB — COMPREHENSIVE METABOLIC PANEL
ALT: 39 IU/L (ref 0–44)
AST: 34 IU/L (ref 0–40)
Albumin: 4.3 g/dL (ref 4.3–5.2)
Alkaline Phosphatase: 125 IU/L — ABNORMAL HIGH (ref 44–121)
BUN/Creatinine Ratio: 13 (ref 9–20)
BUN: 13 mg/dL (ref 6–20)
Bilirubin Total: 0.3 mg/dL (ref 0.0–1.2)
CO2: 25 mmol/L (ref 20–29)
Calcium: 9.5 mg/dL (ref 8.7–10.2)
Chloride: 99 mmol/L (ref 96–106)
Creatinine, Ser: 1 mg/dL (ref 0.76–1.27)
Globulin, Total: 3.3 g/dL (ref 1.5–4.5)
Glucose: 162 mg/dL — ABNORMAL HIGH (ref 70–99)
Potassium: 4.3 mmol/L (ref 3.5–5.2)
Sodium: 141 mmol/L (ref 134–144)
Total Protein: 7.6 g/dL (ref 6.0–8.5)
eGFR: 104 mL/min/{1.73_m2} (ref >59)

## 2024-03-14 LAB — BRAIN NATRIURETIC PEPTIDE: BNP: 12.4 pg/mL (ref 0.0–100.0)

## 2024-03-16 ENCOUNTER — Other Ambulatory Visit: Payer: Self-pay

## 2024-03-18 ENCOUNTER — Other Ambulatory Visit: Payer: Self-pay

## 2024-03-18 MED ORDER — FLUOCINOLONE ACETONIDE BODY 0.01 % EX OIL
TOPICAL_OIL | Freq: Two times a day (BID) | CUTANEOUS | 5 refills | Status: DC | PRN
  Filled 2024-03-18: qty 118.28, 30d supply, fill #0

## 2024-03-18 MED ORDER — KETOCONAZOLE 2 % EX SHAM
MEDICATED_SHAMPOO | CUTANEOUS | 5 refills | Status: DC
  Filled 2024-03-18: qty 120, 30d supply, fill #0

## 2024-03-20 ENCOUNTER — Other Ambulatory Visit: Payer: Self-pay

## 2024-03-23 ENCOUNTER — Other Ambulatory Visit: Payer: Self-pay

## 2024-03-24 ENCOUNTER — Ambulatory Visit: Admitting: Dietician

## 2024-03-25 ENCOUNTER — Encounter: Attending: Physician Assistant | Admitting: Dietician

## 2024-03-25 ENCOUNTER — Encounter: Payer: Self-pay | Admitting: Dietician

## 2024-03-25 VITALS — Ht 71.0 in | Wt 359.3 lb

## 2024-03-25 DIAGNOSIS — Z713 Dietary counseling and surveillance: Secondary | ICD-10-CM | POA: Insufficient documentation

## 2024-03-25 DIAGNOSIS — E1169 Type 2 diabetes mellitus with other specified complication: Secondary | ICD-10-CM

## 2024-03-25 NOTE — Progress Notes (Signed)
 Diabetes Self-Management Education  Visit Type: First/Initial  Appt. Start Time: 0845 Appt. End Time: 1000  03/25/2024  [name], identified by name and date of birth, is a 28 y.o. male with a diagnosis of Diabetes: Type 2.   ASSESSMENT Pt spouse, Clabe Seal, present for appointment. Pt reports goal of weight loss, has a goal weight of 230-240 lbs., pt reports history of weight fluctuations (PMH of CHF, on diuretics), states they have never experiencing edema. Pt reports being on disability for the last 2 years r/t heart condition, spends most of their time sedentary at home. Pt reports taking Metformin, Farxiga, and Ozempic @2 .0 mg for their DM, previously was having sulfur burps but is not experiencing any side effects anymore. Pt reports history of going to the gym, would go M,W,F, some resistance exercise, currently switched gyms and has not been in ~1 month. Pt reports starting IF (16 off, 8 on) for the last few days, trying to eat less calories. Pt reports boredom eating in the evening of energy dense foods (ice cream, chips, etc), and drinking a good amount juices.   Diabetes Self-Management Education - 03/25/24 0902       Visit Information   Visit Type First/Initial      Initial Visit   Diabetes Type Type 2    Date Diagnosed 2021    Are you currently following a meal plan? No    Are you taking your medications as prescribed? Yes      Health Coping   How would you rate your overall health? Fair      Psychosocial Assessment   Patient Belief/Attitude about Diabetes Motivated to manage diabetes    What is the hardest part about your diabetes right now, causing you the most concern, or is the most worrisome to you about your diabetes?   Being active    Self-care barriers None    Self-management support Doctor's office;Family    Other persons present Patient;Spouse/SO    Patient Concerns Nutrition/Meal planning;Weight Control;Healthy Lifestyle    Special Needs None     Preferred Learning Style No preference indicated    Learning Readiness Contemplating    How often do you need to have someone help you when you read instructions, pamphlets, or other written materials from your doctor or pharmacy? 1 - Never    What is the last grade level you completed in school? 12th      Pre-Education Assessment   Patient understands the diabetes disease and treatment process. Needs Instruction    Patient understands incorporating nutritional management into lifestyle. Needs Instruction    Patient undertands incorporating physical activity into lifestyle. Needs Instruction    Patient understands using medications safely. Needs Instruction    Patient understands monitoring blood glucose, interpreting and using results Needs Instruction    Patient understands prevention, detection, and treatment of acute complications. Needs Instruction    Patient understands prevention, detection, and treatment of chronic complications. Needs Instruction    Patient understands how to develop strategies to address psychosocial issues. Needs Instruction    Patient understands how to develop strategies to promote health/change behavior. Needs Instruction      Complications   Last HgB A1C per patient/outside source 7.5 %   03/12/2024   How often do you check your blood sugar? 0 times/day (not testing)    Have you had a dilated eye exam in the past 12 months? No    Have you had a dental exam in the past 12 months?  No    Are you checking your feet? No      Activity / Exercise   Activity / Exercise Type ADL's      Patient Education   Previous Diabetes Education Yes (please comment)   [facility]   Disease Pathophysiology Explored patient's options for treatment of their diabetes;Factors that contribute to the development of diabetes    Healthy Eating Role of diet in the treatment of diabetes and the relationship between the three main macronutrients and blood glucose level;Food label reading,  portion sizes and measuring food.;Meal options for control of blood glucose level and chronic complications.    Being Active Helped patient identify appropriate exercises in relation to his/her diabetes, diabetes complications and other health issue.;Role of exercise on diabetes management, blood pressure control and cardiac health.    Medications Reviewed patients medication for diabetes, action, purpose, timing of dose and side effects.    Chronic complications Relationship between chronic complications and blood glucose control;Lipid levels, blood glucose control and heart disease    Diabetes Stress and Support Identified and addressed patients feelings and concerns about diabetes    Lifestyle and Health Coping Lifestyle issues that need to be addressed for better diabetes care   Weight Loss     Individualized Goals (developed by patient)   Nutrition Follow meal plan discussed;General guidelines for healthy choices and portions discussed    Physical Activity 15 minutes per day    Medications take my medication as prescribed    Monitoring  Not Applicable    Problem Solving Eating Pattern;Addressing barriers to behavior change      Post-Education Assessment   Patient understands the diabetes disease and treatment process. Comprehends key points    Patient understands incorporating nutritional management into lifestyle. Needs Review    Patient undertands incorporating physical activity into lifestyle. Needs Review    Patient understands using medications safely. Comphrehends key points    Patient understands monitoring blood glucose, interpreting and using results N/A    Patient understands prevention, detection, and treatment of acute complications. N/A    Patient understands prevention, detection, and treatment of chronic complications. Needs Review    Patient understands how to develop strategies to address psychosocial issues. Needs Review    Patient understands how to develop strategies  to promote health/change behavior. Needs Review      Outcomes   Expected Outcomes Demonstrated interest in learning. Expect positive outcomes    Future DMSE 2 months    Program Status Not Completed             Individualized Plan for Diabetes Self-Management Training:   Learning Objective:  Patient will have a greater understanding of diabetes self-management. Patient education plan is to attend individual and/or group sessions per assessed needs and concerns.   Plan:   Patient Instructions  Start your day with 1/2 of a protein shake within an hour of waking up!  Work towards eating three meals a day, about 5-6 hours apart!  Begin to recognize carbohydrates, proteins, and non-starchy vegetables in your food choices!  Begin to build your meals using the proportions of the Balanced Plate. First, select your carb choice(s) for the meal. Make this 25% of your meal. Next, select your source of protein to pair with your carb choice(s). Make this another 25% of your meal. Finally, complete your meal with a variety of non-starchy vegetables. Make this the remaining 50% of your meal.  Move your body as much as you can during the day! Remember  that every bit of activity adds up!  Expected Outcomes:  Demonstrated interest in learning. Expect positive outcomes  Education material provided: Food label handouts and Carbohydrate counting sheet  If problems or questions, patient to contact team via:  Phone and Email  Future DSME appointment: 2 months

## 2024-03-25 NOTE — Patient Instructions (Addendum)
 Start your day with 1/2 of a protein shake within an hour of waking up!  Work towards eating three meals a day, about 5-6 hours apart!  Begin to recognize carbohydrates, proteins, and non-starchy vegetables in your food choices!  Begin to build your meals using the proportions of the Balanced Plate. First, select your carb choice(s) for the meal. Make this 25% of your meal. Next, select your source of protein to pair with your carb choice(s). Make this another 25% of your meal. Finally, complete your meal with a variety of non-starchy vegetables. Make this the remaining 50% of your meal.  Move your body as much as you can during the day! Remember that every bit of activity adds up!

## 2024-03-30 ENCOUNTER — Other Ambulatory Visit: Payer: Self-pay

## 2024-03-31 NOTE — Progress Notes (Signed)
 Remote ICD transmission.

## 2024-03-31 NOTE — Addendum Note (Signed)
 Addended by: Elease Etienne A on: 03/31/2024 04:10 PM   Modules accepted: Orders

## 2024-04-02 ENCOUNTER — Ambulatory Visit
Admission: RE | Admit: 2024-04-02 | Discharge: 2024-04-02 | Disposition: A | Discharge status: Home / Self Care | Source: Ambulatory Visit | Attending: Family Medicine

## 2024-04-02 ENCOUNTER — Encounter: Payer: Self-pay | Admitting: Family Medicine

## 2024-04-02 ENCOUNTER — Ambulatory Visit
Admission: RE | Admit: 2024-04-02 | Discharge: 2024-04-02 | Disposition: A | Discharge status: Home / Self Care | Attending: Family Medicine | Admitting: Family Medicine

## 2024-04-02 ENCOUNTER — Other Ambulatory Visit: Payer: Self-pay

## 2024-04-02 ENCOUNTER — Ambulatory Visit (INDEPENDENT_AMBULATORY_CARE_PROVIDER_SITE_OTHER): Admitting: Family Medicine

## 2024-04-02 VITALS — BP 120/78 | HR 84 | Ht 71.0 in | Wt 367.4 lb

## 2024-04-02 DIAGNOSIS — M25562 Pain in left knee: Secondary | ICD-10-CM

## 2024-04-02 DIAGNOSIS — M1712 Unilateral primary osteoarthritis, left knee: Secondary | ICD-10-CM

## 2024-04-02 NOTE — Progress Notes (Signed)
 Primary Care / Sports Medicine Office Visit  Patient Information:  Patient ID: Robert Mccann, male DOB: [birth year] Age: 28 y.o. MRN: [id number]   Robert Mccann is a pleasant 28 y.o. male presenting with the following:  Chief Complaint  Patient presents with   Knee Pain    Left knee pain x 7 yrs. Patient has a clicking and popping since injury in high school.    Vitals:   04/02/24 1032  BP: 120/78  Pulse: 84  SpO2: 97%   Vitals:   04/02/24 1032  Weight: (!) 367 lb 6.4 oz (166.7 kg)  Height: 5\' 11"  (1.803 m)   Body mass index is 51.24 kg/m.  DG Knee Complete 4 Views Left Result Date: 04/02/2024 CLINICAL DATA:  Left knee pain EXAM: LEFT KNEE - COMPLETE 4+ VIEW COMPARISON:  None Available. FINDINGS: Severe osteoarthrosis, narrowing of the medial compartment, marginal osteophytes of the femoral condyle and tibial plateau with subtle calcifications of the lateral meniscus. Patella Alta. IMPRESSION: Severe osteoarthrosis. Electronically Signed   By: Shaaron Adler M.D.   On: 04/02/2024 09:57     Independent interpretation of notes and tests performed by another provider:   Independent interpretation of left knee x-rays dated 04/02/2024 demonstrates severe tricompartmental degenerative changes with primary focality to the lateral and patellofemoral compartments where there is asymmetric joint space loss, osteophyte formation, including lateral patellar facet prominence, with Zoila Shutter test near complete loss of lateral tibiofemoral compartment noted.  Procedures performed:   None  Pertinent History, Exam, Impression, and Recommendations:   Problem List Items Addressed This Visit     Osteoarthritis of left knee - Primary   History of Present Illness Robert Mccann is a 28 year old male who presents with knee popping and clicking. He is accompanied by his fiance.  He has a history of a knee injury sustained during his teenage years while playing football. He does not  recall the exact details of the initial injury but remembers being on crutches. He never fully recovered to 100% after the injury.  He experiences knee popping and clicking, which he has noticed since the initial injury. When standing up, he often has to push his knee in to make it pop, which then allows him to walk. He describes the sensation as a clicking and popping sound, which he associates with potential bone-on-bone contact. The knee popping occurs more frequently when he is active, but he does not experience significant pain. Occasionally, he notices some discomfort, particularly when going downstairs or after increased activity. No significant knee pain is reported, but there is occasional discomfort and frequent popping and clicking sounds in the knee, especially during activity.  He is concerned about the long-term implications of the knee popping, especially as he ages. He has not had any recent imaging or diagnostic studies until now, and he recalls no prior mention of an MRI or further investigation after the initial injury.  He is currently not on any medication for his knee condition and has not received any injections or other treatments for this issue.  Physical Exam PALPATION: No effusion or tenderness on palpation of the left knee. Non-tender patellar and quadriceps tendons. Nontender medial and lateral joint lines. RANGE OF MOTION: Left knee range of motion 0-120 degrees, limited by habitus, no discomfort at maximal terminal flexion. STRENGTH: 5/5 strength in left hip flexion. SPECIAL TESTS: Negative McMurray test and no laxity with valgus stress test on the left knee.  Results RADIOLOGY Left knee X-ray: Narrowing  of the lateral compartment, presence of osteophytes, patella alta, uneven motion with flexion, osteophytic spur at the patellar ridge (04/02/2024)  Assessment and Plan Left Knee Osteoarthritis Chronic left knee osteoarthritis with patellofemoral and lateral  compartment involvement. Osteophytes and high-riding patella present. No significant pain currently, but risk of progression noted. Management plan discussed, including physical therapy and brace use. - Refer to physical therapy with pool therapy option. - Provide home exercises for VMO and hip abductors. - Consider patellar stabilizing brace. - Advise weight management. - Recommend elliptical use over treadmill. - Schedule follow-up in three months.      Relevant Orders   Ambulatory referral to Physical Therapy     Orders & Medications Medications: No orders of the defined types were placed in this encounter.  Orders Placed This Encounter  Procedures   Ambulatory referral to Physical Therapy     No follow-ups on file.     Jerrol Banana, MD, [facility]   Primary Care Sports Medicine Primary Care and Sports Medicine at [facility]

## 2024-04-07 DIAGNOSIS — M1712 Unilateral primary osteoarthritis, left knee: Secondary | ICD-10-CM | POA: Insufficient documentation

## 2024-04-07 NOTE — Patient Instructions (Signed)
 Patient Plan for Post-Visit Guidance  1. Physical Therapy:    - Attend physical therapy sessions, including pool therapy if available.  2. Home Exercises:    - Perform exercises for the VMO (vastus medialis oblique) and hip abductors as instructed.  3. Brace Use:    - Consider using a patellar stabilizing brace for support.  4. Lifestyle Recommendations:    - Focus on weight management to help reduce knee stress.    - Use an elliptical machine for exercise instead of a treadmill.  5. Follow-Up:    - Schedule a follow-up appointment in three months to assess progress.  Please follow these steps and reach out if you have any questions or need further assistance.

## 2024-04-07 NOTE — Assessment & Plan Note (Signed)
 History of Present Illness Robert Mccann is a 28 year old male who presents with knee popping and clicking. He is accompanied by his fiance.  He has a history of a knee injury sustained during his teenage years while playing football. He does not recall the exact details of the initial injury but remembers being on crutches. He never fully recovered to 100% after the injury.  He experiences knee popping and clicking, which he has noticed since the initial injury. When standing up, he often has to push his knee in to make it pop, which then allows him to walk. He describes the sensation as a clicking and popping sound, which he associates with potential bone-on-bone contact. The knee popping occurs more frequently when he is active, but he does not experience significant pain. Occasionally, he notices some discomfort, particularly when going downstairs or after increased activity. No significant knee pain is reported, but there is occasional discomfort and frequent popping and clicking sounds in the knee, especially during activity.  He is concerned about the long-term implications of the knee popping, especially as he ages. He has not had any recent imaging or diagnostic studies until now, and he recalls no prior mention of an MRI or further investigation after the initial injury.  He is currently not on any medication for his knee condition and has not received any injections or other treatments for this issue.  Physical Exam PALPATION: No effusion or tenderness on palpation of the left knee. Non-tender patellar and quadriceps tendons. Nontender medial and lateral joint lines. RANGE OF MOTION: Left knee range of motion 0-120 degrees, limited by habitus, no discomfort at maximal terminal flexion. STRENGTH: 5/5 strength in left hip flexion. SPECIAL TESTS: Negative McMurray test and no laxity with valgus stress test on the left knee.  Results RADIOLOGY Left knee X-ray: Narrowing of the lateral  compartment, presence of osteophytes, patella alta, uneven motion with flexion, osteophytic spur at the patellar ridge (04/02/2024)  Assessment and Plan Left Knee Osteoarthritis Chronic left knee osteoarthritis with patellofemoral and lateral compartment involvement. Osteophytes and high-riding patella present. No significant pain currently, but risk of progression noted. Management plan discussed, including physical therapy and brace use. - Refer to physical therapy with pool therapy option. - Provide home exercises for VMO and hip abductors. - Consider patellar stabilizing brace. - Advise weight management. - Recommend elliptical use over treadmill. - Schedule follow-up in three months.

## 2024-04-13 ENCOUNTER — Encounter: Payer: Self-pay | Admitting: Physician Assistant

## 2024-04-13 ENCOUNTER — Other Ambulatory Visit: Payer: Self-pay

## 2024-04-13 ENCOUNTER — Ambulatory Visit (INDEPENDENT_AMBULATORY_CARE_PROVIDER_SITE_OTHER): Admitting: Physician Assistant

## 2024-04-13 VITALS — BP 134/82 | HR 79 | Temp 98.5°F | Ht 71.0 in | Wt 358.0 lb

## 2024-04-13 DIAGNOSIS — I422 Other hypertrophic cardiomyopathy: Secondary | ICD-10-CM | POA: Diagnosis not present

## 2024-04-13 DIAGNOSIS — Z6841 Body Mass Index (BMI) 40.0 and over, adult: Secondary | ICD-10-CM

## 2024-04-13 DIAGNOSIS — E66813 Obesity, class 3: Secondary | ICD-10-CM | POA: Diagnosis not present

## 2024-04-13 DIAGNOSIS — L210 Seborrhea capitis: Secondary | ICD-10-CM

## 2024-04-13 MED ORDER — KETOCONAZOLE 2 % EX SHAM
MEDICATED_SHAMPOO | CUTANEOUS | 3 refills | Status: DC
  Filled 2024-04-13 – 2024-04-30 (×3): qty 120, 30d supply, fill #0
  Filled 2024-07-16: qty 120, 30d supply, fill #1
  Filled 2024-07-23: qty 120, 30d supply, fill #2

## 2024-04-13 MED ORDER — FLUOCINOLONE ACETONIDE BODY 0.01 % EX OIL
1.0000 | TOPICAL_OIL | Freq: Two times a day (BID) | CUTANEOUS | 3 refills | Status: AC | PRN
  Filled 2024-04-13 – 2024-04-30 (×2): qty 118.28, 30d supply, fill #0
  Filled 2024-07-16: qty 118.28, 30d supply, fill #1
  Filled 2024-07-23 – 2024-12-02 (×2): qty 118.28, 30d supply, fill #2

## 2024-04-13 NOTE — Assessment & Plan Note (Signed)
 Congratulated on weight loss and return to gym. Continue healthy habits and RD follow up. Will try to do peer-to-peer for Mounjaro.

## 2024-04-13 NOTE — Assessment & Plan Note (Signed)
 Refilling topical rx.

## 2024-04-13 NOTE — Progress Notes (Signed)
 Date:  04/13/2024   Name:  Robert Mccann   DOB:  05-Jul-1996   MRN:  [id number]   Chief Complaint: Medical Management of Chronic Issues and Cerumen Impaction (Both ears, painful, no drainage )  HPI Alper returns for 1 month f/u on chronic conditions, mainly DM2 and obesity. Last visit he described having some type of genetic mutation. 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 Cardiology note says they planned [facility] referral but I do not see that this was completed, and patient has not heard anything since cardiology visit 1 month ago.  He has also returned to the gym and is seeing an RD at the lifestyle and nutrition center at [facility]. He is down 9 lbs since last visit, though he also has fluid fluctuations with HF.   Last visit I tried to prescribe Mounjaro instead of Ozempic. There was an insurance issue and ultimately he had a 2 wk lapse in Ozempic use but has since restarted. A1c with me last time was 7.5%.   Requests refills for topicals - ketoconazole shampoo and fluocinolone oil. Prev written by dermatologist who he is not currently seeing.   Feels like something is "poking" his eardrums bilaterally.   Medication list has been reviewed and updated.  Current Meds  Medication Sig   apixaban (ELIQUIS) 5 MG TABS tablet Take 1 tablet (5 mg total) by mouth 2 (two) times daily.   Blood Glucose Monitoring Suppl (BLOOD GLUCOSE MONITOR SYSTEM) w/Device KIT Use up to four times daily as directed.   dapagliflozin propanediol (FARXIGA) 10 MG TABS tablet Take 1 tablet (10 mg total) by mouth daily.   ezetimibe (ZETIA) 10 MG tablet Take 1 tablet (10 mg total) by mouth daily.   furosemide (LASIX) 40 MG tablet Take by mouth.   metFORMIN (GLUCOPHAGE) 500 MG tablet Take 1 tablet (500 mg total) by mouth 2 (two) times daily with a meal.   metoprolol succinate (TOPROL-XL) 100 MG 24 hr tablet Take 1 tablet (100 mg total) by mouth daily.   potassium chloride (KLOR-CON) 20 MEQ packet  Dissolve and take 60 mEq (3 packets) by mouth daily. (Patient taking differently: Take 60 mEq by mouth as needed.)   rosuvastatin (CRESTOR) 40 MG tablet Take 1 tablet (40 mg total) by mouth daily.   sacubitril-valsartan (ENTRESTO) 97-103 MG Take 1 tablet by mouth 2 (two) times daily.   Semaglutide, 2 MG/DOSE, (OZEMPIC, 2 MG/DOSE,) 8 MG/3ML SOPN Inject 2 mg into the skin once a week.   sildenafil (VIAGRA) 50 MG tablet Take 1 tablet (50 mg total) by mouth daily as needed for erectile dysfunction.   spironolactone (ALDACTONE) 25 MG tablet Take 1 tablet (25 mg total) by mouth daily.   torsemide (DEMADEX) 20 MG tablet Take 2 tablets (40 mg total) by mouth every other day alternating with 3 tablets (60 mg total) every other day. (Patient taking differently: Take 40 mg by mouth every other day. Alternates 40mg   and 60mg )   [DISCONTINUED] Fluocinolone Acetonide Body 0.01 % OIL Apply to scalp, face, beard up to 2 times a day as needed for scaling, itching, redness.   [DISCONTINUED] Fluocinolone Acetonide Body 0.01 % OIL Apply topically to scalp, face, and beard up to 2 (two) times daily as needed for scaling, itching, redness.   [DISCONTINUED] ketoconazole (NIZORAL) 2 % shampoo Apply topically.   [DISCONTINUED] ketoconazole (NIZORAL) 2 % shampoo Shampoo topically 3 (three) times a week. Lather and leave on for 5 to 10 minutes before  rinsing. Can use on face, beard, and scalp.     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) 01/31/2024   ICD (implantable cardioverter-defibrillator) in place 02/25/2023   NICM (nonischemic cardiomyopathy) (HCC)    Chronic HFrEF (heart failure with reduced ejection fraction) (HCC)    Essential hypertension 03/21/2022   History of non-ST  elevation myocardial infarction (NSTEMI) 03/13/2022   Hyperlipidemia associated with type 2 diabetes mellitus (HCC) 03/13/2022   History of CVA (cerebrovascular accident) 11/05/2021   History of pulmonary embolus (PE) 06/28/2021   Class 3 severe obesity with serious comorbidity and body mass index (BMI) of 45.0 to 49.9 in adult [facility])    Type 2 diabetes mellitus with other specified complication (HCC) 06/27/2021    No Known Allergies  Immunization History  Administered Date(s) Administered   Influenza Inj Mdck Quad Pf 09/25/2020   Influenza,inj,Quad PF,6+ Mos 10/06/2019   Influenza-Unspecified 09/25/2020, 10/02/2022, 08/15/2023   PPD Test 09/23/2020, 01/27/2022    Past Surgical History:  Procedure Laterality Date   COLONOSCOPY N/A 05/18/2022   Procedure: COLONOSCOPY;  Surgeon: [name], MD;  Location: [facility] ENDOSCOPY;  Service: Gastroenterology;  Laterality: N/A;   HAND SURGERY Left age 81   HAND SURGERY     HEMOSTASIS CLIP PLACEMENT  05/18/2022   Procedure: HEMOSTASIS CLIP PLACEMENT;  Surgeon: [name], MD;  Location: [facility] ENDOSCOPY;  Service: Gastroenterology;;   ICD IMPLANT N/A 02/25/2023   Procedure: ICD IMPLANT;  Surgeon: Lanier Prude, MD;  Location: [facility] INVASIVE CV LAB;  Service: Cardiovascular;  Laterality: N/A;   POLYPECTOMY  05/18/2022   Procedure: POLYPECTOMY;  Surgeon: [name], MD;  Location: [facility] ENDOSCOPY;  Service: Gastroenterology;;   PULMONARY THROMBECTOMY N/A 06/28/2021   Procedure: PULMONARY THROMBECTOMY;  Surgeon: Annice Needy, MD;  Location: [facility] INVASIVE CV LAB;  Service: Cardiovascular;  Laterality: N/A;   TEE WITHOUT CARDIOVERSION N/A 11/08/2021   Procedure: TRANSESOPHAGEAL ECHOCARDIOGRAM (TEE);  Surgeon: Debbe Odea, MD;  Location: [facility] ORS;  Service: Cardiovascular;  Laterality: N/A;    Social History   Tobacco Use   Smoking status: Never   Smokeless tobacco: Never  Vaping Use   Vaping status: Never Used  Substance Use Topics    Alcohol use: Not Currently   Drug use: Not Currently    Types: Marijuana    Family History  Problem Relation Age of Onset   Stroke Mother    Diabetes Mother    Heart disease Mother    Heart failure Mother    Cerebrovascular Accident Mother    Diabetes Father    Hypertension Father    Heart disease Maternal Uncle    Diabetes Paternal Uncle    Heart disease Maternal Grandmother    Diabetes Maternal Grandfather    Hypertension Paternal Grandmother    Diabetes Paternal Grandfather    Cancer Paternal Grandfather    Cancer Cousin        unknown origin   Cancer Cousin        unknown origing        04/13/2024    8:15 AM 03/12/2024    9:52 AM 01/05/2022   10:22 AM  GAD 7 : Generalized Anxiety Score  Nervous, Anxious, on Edge 0 0 0  Control/stop worrying 0 0 0  Worry too much - different things 0 0 0  Trouble relaxing 0 0 0  Restless 0 0 0  Easily annoyed or irritable 0 0 0  Afraid - awful might happen 0 0 0  Total GAD 7 Score 0 0 0  Anxiety Difficulty Not difficult at all Not difficult at all Not difficult at all       04/13/2024    8:15 AM 03/25/2024    8:52 AM 03/12/2024    9:51 AM  Depression screen PHQ 2/9  Decreased Interest 0 0 0  Down, Depressed, Hopeless 0 0 0  PHQ - 2 Score 0 0 0    BP Readings from Last 3 Encounters:  04/13/24 134/82  04/02/24 120/78  03/13/24 123/87    Wt Readings from Last 3 Encounters:  04/13/24 (!) 358 lb (162.4 kg)  04/02/24 (!) 367 lb 6.4 oz (166.7 kg)  03/25/24 (!) 359 lb 4.8 oz (163 kg)    BP 134/82   Pulse 79   Temp 98.5 F (36.9 C)   Ht 5\' 11"  (1.803 m)   Wt (!) 358 lb (162.4 kg)   SpO2 97%   BMI 49.93 kg/m   Physical Exam Vitals and nursing note reviewed.  Constitutional:      Appearance: Normal appearance.  HENT:     Right Ear: Tympanic membrane normal.     Left Ear: Tympanic membrane normal.     Ears:     Comments: Right EAC is not obstructed, but there are small flakes of dried cerumen scattered throughout  including one flake resting directly on the right TM.  Left EAC with cerumen impaction, easily removed by provider using curette in one attempt. Hearing improved immediately.  Cardiovascular:     Rate and Rhythm: Normal rate.  Pulmonary:     Effort: Pulmonary effort is normal.  Abdominal:     General: There is no distension.  Musculoskeletal:        General: Normal range of motion.  Skin:    General: Skin is warm and dry.  Neurological:     Mental Status: He is alert and oriented to person, place, and time.     Gait: Gait is intact.  Psychiatric:        Mood and Affect: Mood and affect normal.     Recent Labs     Component Value Date/Time   NA 141 03/13/2024 1135   K 4.3 03/13/2024 1135   CL 99 03/13/2024 1135   CO2 25 03/13/2024 1135   GLUCOSE 162 (H) 03/13/2024 1135   GLUCOSE 137 (H) 01/27/2024 1127   BUN 13 03/13/2024 1135   CREATININE 1.00 03/13/2024 1135   CALCIUM 9.5 03/13/2024 1135   PROT 7.6 03/13/2024 1135   ALBUMIN 4.3 03/13/2024 1135   AST 34 03/13/2024 1135   ALT 39 03/13/2024 1135   ALKPHOS 125 (H) 03/13/2024 1135   BILITOT 0.3 03/13/2024 1135   GFRNONAA >60 01/27/2024 1127    Lab Results  Component Value Date   WBC 5.7 08/28/2023   HGB 15.4 08/28/2023   HCT 45.0 08/28/2023   MCV 88 08/28/2023   PLT 375 08/28/2023   Lab Results  Component Value Date   HGBA1C 7.5 (A) 03/12/2024   Lab Results  Component Value Date   CHOL 131 03/13/2024   HDL 42 03/13/2024   LDLCALC 67 03/13/2024   TRIG 124 03/13/2024   CHOLHDL 3.1 03/13/2024   Lab Results  Component Value Date   TSH 5.035 (H)  03/17/2022     Assessment and Plan:  Class 3 severe obesity due to excess calories with serious comorbidity and body mass index (BMI) of 45.0 to 49.9 in adult [facility]) Assessment & Plan: Congratulated on weight loss and return to gym. Continue healthy habits and RD follow up. Will try to do peer-to-peer for Mounjaro.    Hypertrophic cardiomyopathy associated with  mutation in MYH7 gene [facility]) Assessment & Plan: Sending to [facility], referral submitted today.   Orders: -     Ambulatory referral to Genetics  Dandruff in adult Assessment & Plan: Refilling topical rx.   Orders: -     Fluocinolone Acetonide Body; Apply 1 Application topically 2 (two) times daily as needed. Apply to scalp, face, beard up to 2 times a day as needed for scaling, itching, redness.  Dispense: 118 mL; Refill: 3 -     Ketoconazole; Apply topically 2 (two) times a week.  Dispense: 120 mL; Refill: 3     F/u as scheduled for July    Cody Das, PA-C, DMSc, Nutritionist [facility] Primary Care and Sports Medicine [facility] [phone]

## 2024-04-13 NOTE — Assessment & Plan Note (Signed)
 Sending to [facility], referral submitted today.

## 2024-04-16 ENCOUNTER — Telehealth: Payer: Self-pay | Admitting: Physician Assistant

## 2024-04-16 NOTE — Telephone Encounter (Signed)
 Spoke with Medicaid provider line at [phone]. Appealed for coverage of Mounjaro due to inadequate glycemic control with Ozempic 2 mg. Other drugs on the preferred list are weaker, not stronger. Appeal submitted for Mounjaro 7.5 mg/wk. Should have decision in 72h.

## 2024-04-20 ENCOUNTER — Ambulatory Visit
Admission: RE | Admit: 2024-04-20 | Discharge: 2024-04-20 | Disposition: A | Discharge status: Home / Self Care | Source: Ambulatory Visit | Attending: Internal Medicine | Admitting: Internal Medicine

## 2024-04-20 DIAGNOSIS — Z86711 Personal history of pulmonary embolism: Secondary | ICD-10-CM | POA: Insufficient documentation

## 2024-04-20 DIAGNOSIS — I509 Heart failure, unspecified: Secondary | ICD-10-CM | POA: Diagnosis present

## 2024-04-20 DIAGNOSIS — I5022 Chronic systolic (congestive) heart failure: Secondary | ICD-10-CM | POA: Diagnosis present

## 2024-04-20 DIAGNOSIS — Z8673 Personal history of transient ischemic attack (TIA), and cerebral infarction without residual deficits: Secondary | ICD-10-CM | POA: Diagnosis not present

## 2024-04-20 LAB — ECHOCARDIOGRAM COMPLETE
AR max vel: 3.26 cm2
AV Area VTI: 3.69 cm2
AV Area mean vel: 3.15 cm2
AV Mean grad: 2 mmHg
AV Peak grad: 3 mmHg
Ao pk vel: 0.87 m/s
Area-P 1/2: 7.29 cm2
S' Lateral: 4.5 cm
Single Plane A4C EF: 35.8 %

## 2024-04-20 NOTE — Progress Notes (Signed)
*  PRELIMINARY RESULTS* Echocardiogram 2D Echocardiogram has been performed.  Robert Mccann 04/20/2024, 9:53 AM

## 2024-04-27 ENCOUNTER — Other Ambulatory Visit: Payer: Self-pay

## 2024-04-27 ENCOUNTER — Encounter (INDEPENDENT_AMBULATORY_CARE_PROVIDER_SITE_OTHER): Admitting: Internal Medicine

## 2024-04-30 ENCOUNTER — Other Ambulatory Visit: Payer: Self-pay

## 2024-04-30 ENCOUNTER — Other Ambulatory Visit (HOSPITAL_COMMUNITY): Payer: Self-pay | Admitting: Internal Medicine

## 2024-05-01 ENCOUNTER — Other Ambulatory Visit: Payer: Self-pay

## 2024-05-01 ENCOUNTER — Other Ambulatory Visit: Payer: Self-pay | Admitting: Physician Assistant

## 2024-05-04 ENCOUNTER — Encounter: Payer: Self-pay | Admitting: Physician Assistant

## 2024-05-04 ENCOUNTER — Other Ambulatory Visit: Payer: Self-pay

## 2024-05-04 ENCOUNTER — Telehealth: Payer: Self-pay

## 2024-05-04 MED FILL — Potassium Chloride Powder Packet 20 mEq: ORAL | 30 days supply | Qty: 90 | Fill #0 | Status: CN

## 2024-05-04 MED FILL — Torsemide Tab 20 MG: ORAL | 54 days supply | Qty: 140 | Fill #0 | Status: CN

## 2024-05-04 MED FILL — Spironolactone Tab 25 MG: ORAL | 90 days supply | Qty: 90 | Fill #0 | Status: CN

## 2024-05-04 NOTE — Telephone Encounter (Signed)
 Copied from CRM [id number]. Topic: Clinical - Medical Advice >> May 04, 2024 10:39 AM Phil Braun wrote: Reason for CRM: Lovett Ruck with [place] [employer] [phone]  Lovett Ruck need to know if pt has tried and failed Byetta Pen or the Trulicity or Victosia or Ozempic . If the patient has not tried and failed any of these why has he not tried them? Please advise.

## 2024-05-04 NOTE — Telephone Encounter (Signed)
 Duplicate request- all filled by outside provider today Requested Prescriptions  Pending Prescriptions Disp Refills   potassium chloride  (KLOR-CON ) 20 MEQ packet 90 packet 1    Sig: Dissolve and take 60 mEq (3 packets) by mouth daily.     Endocrinology:  Minerals - Potassium Supplementation Passed - 05/04/2024  3:31 PM      Passed - K in normal range and within 360 days    Potassium  Date Value Ref Range Status  03/13/2024 4.3 3.5 - 5.2 mmol/L Final         Passed - Cr in normal range and within 360 days    Creatinine, Ser  Date Value Ref Range Status  03/13/2024 1.00 0.76 - 1.27 mg/dL Final         Passed - Valid encounter within last 12 months    Recent Outpatient Visits           3 weeks ago Class 3 severe obesity due to excess calories with serious comorbidity and body mass index (BMI) of 45.0 to 49.9 in adult   [facility] Primary [facility] & [facility] Medicine at [facility], Arleen Lacer, [place]   1 month ago Osteoarthritis of left knee, unspecified osteoarthritis type   [facility] & Sports Medicine at [facility], Dessie Flow, MD   1 month ago Type 2 diabetes mellitus with other specified complication, without long-term current use of insulin  [facility])   [facility] & Sports Medicine at [facility], Arleen Lacer, [place]       Future Appointments             In 3 weeks Morey Ar, NP [facility] at [facility]             spironolactone  (ALDACTONE ) 25 MG tablet 90 tablet 0    Sig: Take 1 tablet (25 mg total) by mouth daily.     Cardiovascular: Diuretics - Aldosterone Antagonist Passed - 05/04/2024  3:31 PM      Passed - Cr in normal range and within 180 days    Creatinine, Ser  Date Value Ref Range Status  03/13/2024 1.00 0.76 - 1.27 mg/dL Final         Passed - K in normal range and within 180 days    Potassium  Date Value Ref Range Status  03/13/2024 4.3 3.5 - 5.2 mmol/L Final         Passed - Na in  normal range and within 180 days    Sodium  Date Value Ref Range Status  03/13/2024 141 134 - 144 mmol/L Final         Passed - eGFR is 30 or above and within 180 days    GFR, Estimated  Date Value Ref Range Status  01/27/2024 >60 >60 mL/min Final    Comment:    (NOTE) Calculated using the CKD-EPI Creatinine Equation (2021)    eGFR  Date Value Ref Range Status  03/13/2024 104 >59 mL/min/1.73 Final         Passed - Last BP in normal range    BP Readings from Last 1 Encounters:  04/13/24 134/82         Passed - Valid encounter within last 6 months    Recent Outpatient Visits           3 weeks ago Class 3 severe obesity due to excess calories with serious comorbidity and body mass index (BMI) of 45.0 to 49.9 in adult  [facility] Sports Medicine at [facility], Arleen Lacer, [place]   1 month ago Osteoarthritis of left knee, unspecified osteoarthritis type   [place] [facility] at [facility], Dessie Flow, [place]   1 month ago Type 2 diabetes mellitus with other specified complication, without long-term current use of insulin  [facility])   [place] [facility] at [facility], Arleen Lacer, [place]       Future Appointments             In 3 weeks Asberry Bjornstad, Bernardo Bridgeman, NP [facility] at [facility]             torsemide  (DEMADEX ) 20 MG tablet 140 tablet 1    Sig: Take 2 tablets (40 mg total) by mouth every other day alternating with 3 tablets (60 mg total) every other day.     Cardiovascular:  Diuretics - Loop Failed - 05/04/2024  3:31 PM      Failed - Mg Level in normal range and within 180 days    Magnesium   Date Value Ref Range Status  05/29/2023 2.0 1.7 - 2.4 mg/dL Final    Comment:    Performed at [facility], [address] [place], [place] [address]         Passed - K in normal range and within 180 days    Potassium  Date Value Ref Range Status  03/13/2024 4.3 3.5 -  5.2 mmol/L Final         Passed - Ca in normal range and within 180 days    Calcium   Date Value Ref Range Status  03/13/2024 9.5 8.7 - 10.2 mg/dL Final         Passed - Na in normal range and within 180 days    Sodium  Date Value Ref Range Status  03/13/2024 141 134 - 144 mmol/L Final         Passed - Cr in normal range and within 180 days    Creatinine, Ser  Date Value Ref Range Status  03/13/2024 1.00 0.76 - 1.27 mg/dL Final         Passed - Cl in normal range and within 180 days    Chloride  Date Value Ref Range Status  03/13/2024 99 96 - 106 mmol/L Final         Passed - Last BP in normal range    BP Readings from Last 1 Encounters:  04/13/24 134/82         Passed - Valid encounter within last 6 months    Recent Outpatient Visits           3 weeks ago Class 3 severe obesity due to excess calories with serious comorbidity and body mass index (BMI) of 45.0 to 49.9 in adult   [facility] Medicine at [facility], Arleen Lacer, [place]   1 month ago Osteoarthritis of left knee, unspecified osteoarthritis type   [facility] Care & [facility] Medicine at [facility], Dessie Flow, [place]   1 month ago Type 2 diabetes mellitus with other specified complication, without long-term current use of insulin  [facility])   [facility] Primary Care & Sports Medicine at [facility], Arleen Lacer, [place]       Future Appointments             In 3 weeks Asberry Bjornstad, Bernardo Bridgeman, NP [facility] at [facility]

## 2024-05-05 NOTE — Telephone Encounter (Signed)
 I faxed the letter yesterday to [facility].  KP

## 2024-05-14 DIAGNOSIS — I110 Hypertensive heart disease with heart failure: Secondary | ICD-10-CM | POA: Insufficient documentation

## 2024-05-15 ENCOUNTER — Ambulatory Visit: Admitting: Dietician

## 2024-05-19 ENCOUNTER — Other Ambulatory Visit: Payer: Self-pay

## 2024-05-19 DIAGNOSIS — Z1589 Genetic susceptibility to other disease: Secondary | ICD-10-CM | POA: Insufficient documentation

## 2024-05-20 ENCOUNTER — Other Ambulatory Visit: Payer: Self-pay

## 2024-05-21 ENCOUNTER — Other Ambulatory Visit: Payer: Self-pay

## 2024-05-21 MED ORDER — APIXABAN 5 MG PO TABS
5.0000 mg | ORAL_TABLET | Freq: Two times a day (BID) | ORAL | 3 refills | Status: AC
  Filled 2024-05-21: qty 180, 90d supply, fill #0
  Filled 2024-07-23 – 2024-09-03 (×2): qty 180, 90d supply, fill #1
  Filled 2024-10-29: qty 180, 90d supply, fill #2

## 2024-05-21 MED FILL — Torsemide Tab 20 MG: ORAL | 90 days supply | Qty: 90 | Fill #0 | Status: AC

## 2024-05-21 MED FILL — Spironolactone Tab 25 MG: ORAL | 90 days supply | Qty: 90 | Fill #0 | Status: AC

## 2024-05-21 MED FILL — Potassium Chloride Powder Packet 20 mEq: ORAL | 30 days supply | Qty: 90 | Fill #0 | Status: AC

## 2024-05-23 NOTE — Progress Notes (Deleted)
 Cardiology Clinic Note   Date: 05/23/2024 ID: Robert Mccann, DOB 29-Aug-1996, MRN [id number]  Primary Cardiologist:  Constancia Delton, MD  Chief Complaint   Robert Mccann is a 28 y.o. male who presents to the clinic today for ***  Patient Profile   Robert Mccann is followed by *** for the history outlined below.      Past medical history significant for: Chronic systolic heart failure/nonischemic cardiomyopathy. Coronary CTA 08/20/2022: Calcium  score 0.  No evidence of CAD. ICD implantation 02/25/2023. Remote device check 02/26/2024: Normal device function.  Battery and lead parameters stable.  Device programming appropriate. Echo 11/21/2023: EF 35 to 40%.  Mild global hypokinesis with severe hypokinesis of the anterior and anteroseptal wall.  Grade I DD.  Normal RV size/function.  Normal PA pressure*** Echo 04/20/2024: EF 35 to 40%.  Wall motion unable to be evaluated.  Mildly dilated LV internal cavity.  Mild LVH.  Indeterminate diastolic parameters.  Mildly reduced RV function, normal RV size. Mobitz 2 heart block. LV thrombus. Cardiac MRI 03/19/2022: Severe LV dilatation with severe systolic dysfunction, EF 11%.  Moderate RV dilatation with severe systolic dysfunction, EF 19%.  Large LV thrombus adjacent to anterior/anteroseptal walls 58 mm x 27 mm.  Smaller LV thrombus adjacent to inferior wall 39 mm x 9 mm.  Hypertrabeculation of LV that meets criteria for noncompaction though hypertrabeculation can also be seen in dilated cardiomyopathies.  LGE primarily in the noncompaction regions in the basal to apical anterior wall, apex, and apical to mid inferior wall but appears to extend into compacted subendocardial particularly in the apical inferior wall and apex.  Given subendocardial LGE would recommend ruling out obstructive CAD but subendocardial LGE can also be seen in noncompaction.  Moderate pericardial effusion adjacent to LV inferior wall.  Moderate TR.  Mild  MR. Hypertension. Hyperlipidemia. Lipid panel 03/13/2024: LDL 67, HDL 42, TG 124, total 131. CVA. PE. Pulmonary thrombectomy 06/28/2021. T2DM.  In summary, patient initially presented to [facility] in June 2022 with chest pain and dyspnea.  CT of the chest revealed pulmonary emboli extending from the lobar arteries of the right upper and lower lobes and throughout there is segmental branches.  He subsequently underwent thrombectomy and thrombolysis of the right upper/middle/lower lobes.  He was treated with heparin  and subsequently Eliquis .  Lower extremity ultrasound was negative for DVT though it was noted that the patient previously spent 12 hours each way to [place] and back about a week or 2 prior to admission.  Echo at that time showed EF <20% with global hypokinesis, Grade II DD, severely reduced RV function.  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  During hospital admission he was noted to have intermittent Mobitz 2 heart block during periods of sleep.  GDMT was started at time of discharge.  At follow-up July 2022 he was only taking carvedilol , Eliquis , low-dose Lasix  as he was not able to afford Farxiga  and Entresto .  He was provided with samples and co-pay card for Entresto .  At follow-up in September 2022 was placed on spironolactone  and Lasix  was switched to torsemide .  In November 2022 patient was admitted to [facility] with his 2 to 3-day history of dysarthria and left hand numbness/weakness.  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  TEE did not show LA/LAA/LV thrombus and bubble study was negative.  He reported compliance with Eliquis  which was held in the setting of areas of petechial hemorrhage on follow-up CT.  There was a discussion of switching to Coumadin  but patient preferred to remain on Eliquis  and this was resumed once cleared by neurology.  Upon follow-up in January 2023 patient continued with some aphasia and  left-sided visual neglect.  In March 2023 patient underwent hospital admission at [facility] for recurrent PE in the setting of being off anticoagulation for 2 weeks.  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  He was improving symptomatically but labs were concerning for multiorgan dysfunction.  He was transferred to [facility] for further management with advanced heart failure service.  Cardiac MRI was performed showing LV thrombus and noncompaction as described above.  Patient followed up in the office with Dr. Junnie Olives in June 2023 with complaints of bradycardia.  He had self increased Lasix  as he felt he was not urinating enough. He had not been taking carvedilol  secondary to previous heart block.  He was instructed to return to prior dose of Lasix .  He was seen by heart failure clinic and Lasix  was increased.  He underwent sleep study which did not reveal OSA.  Coronary CTA August 2023 showed no evidence of CAD.  Echo December 2023 showed EF 25 to 30%, global hypokinesis, LV internal cavity moderately dilated, normal diastolic parameters, abnormal global strain, normal RV size/function, mild LAE, moderately dilated pulmonary artery.  Patient was evaluated by Dr. Marven Slimmer on 10/10/2022 for consideration of ICD.  He underwent ICD implantation February 2024.   Patient was last seen by Dr. Julane Ny in advanced heart failure clinic on 03/13/2024.  He was doing well at that time with no complaints.  He continued going to school for HVAC.  He was referred to genetic counseling as he and his wife are considering having kids.  Echo was repeated as detailed above.     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  Remote device check February 2025 showed normal device function with stable battery and lead  parameters and appropriate device programming.  Echo April 2024 showed EF 35 to 40%, mild LVH, mildly reduced RV function.  Patient denies lower extremity edema, orthopnea or PND.  Euvolemic and well compensated on exam.*** -Continue torsemide , spironolactone , Entresto , Toprol , Farxiga .   LV thrombus Large LV thrombus on echo and cardiac MRI April 2023.  Patient denies spontaneous bleeding concerns.*** -Continue Eliquis .   Hypertension BP today***. No reports of headaches or dizziness.  -Continue Entresto , spironolactone , Toprol .   Hyperlipidemia LDL 67 March 2025, at goal. -Continue rosuvastatin .    Weight loss Patient is on Ozempic  and recently joined [employer]. He is very motivated to lose weight.*** -Consider tracking food to determine daily caloric intake.  -Continue to advance physical activity as tolerated.   ROS: All other systems reviewed and are otherwise negative except as noted in History of Present Illness.  EKGs/Labs Reviewed        03/13/2024: ALT 39; AST 34; BUN 13; Creatinine, Ser 1.00; Potassium 4.3; Sodium 141   08/28/2023: Hemoglobin 15.4; WBC 5.7   No results found for requested labs within last 365 days.   03/13/2024: BNP 12.4  ***  Risk Assessment/Calculations    {Does this patient have ATRIAL FIBRILLATION?:[phone]} No BP recorded.  {Refresh Note OR Click here to enter BP  :1}***        Physical Exam    VS:  There were no vitals taken for this visit. ,  BMI There is no height or weight on file to calculate BMI.  GEN: Well nourished, well developed, in no acute distress. Neck: No JVD or carotid bruits. Cardiac: *** RRR. No murmurs. No rubs or gallops.   Respiratory:  Respirations regular and unlabored. Clear to auscultation without rales, wheezing or rhonchi. GI: Soft, nontender, nondistended. Extremities: Radials/DP/PT 2+ and equal bilaterally. No clubbing or cyanosis. No edema ***  Skin: Warm and dry, no rash. Neuro: Strength  intact.  Assessment & Plan   ***  Disposition: ***     {Are you ordering a CV Procedure (e.g. stress test, cath, DCCV, TEE, etc)?   Press F2        :[id number]}   Signed, Lonell Rives. Maximiano Lott, DNP, NP-C

## 2024-05-26 ENCOUNTER — Ambulatory Visit: Admitting: Student

## 2024-05-27 ENCOUNTER — Ambulatory Visit: Payer: Medicaid Other

## 2024-05-27 DIAGNOSIS — I428 Other cardiomyopathies: Secondary | ICD-10-CM | POA: Diagnosis not present

## 2024-05-28 LAB — CUP PACEART REMOTE DEVICE CHECK
Battery Remaining Longevity: 144 mo
Battery Remaining Percentage: 100 %
Brady Statistic RA Percent Paced: 0 %
Brady Statistic RV Percent Paced: 0 %
Date Time Interrogation Session: 20250528174900
HighPow Impedance: 72 Ohm
Implantable Lead Connection Status: 753985
Implantable Lead Connection Status: 753985
Implantable Lead Implant Date: 20240227
Implantable Lead Implant Date: 20240227
Implantable Lead Location: 753859
Implantable Lead Location: 753860
Implantable Lead Model: 673
Implantable Lead Model: 7841
Implantable Lead Serial Number: 1407551
Implantable Lead Serial Number: 209112
Implantable Pulse Generator Implant Date: 20240227
Lead Channel Impedance Value: 362 Ohm
Lead Channel Impedance Value: 744 Ohm
Lead Channel Pacing Threshold Amplitude: 0.4 V
Lead Channel Pacing Threshold Amplitude: 1.5 V
Lead Channel Pacing Threshold Pulse Width: 0.4 ms
Lead Channel Pacing Threshold Pulse Width: 0.4 ms
Lead Channel Setting Pacing Amplitude: 2 V
Lead Channel Setting Pacing Amplitude: 2.5 V
Lead Channel Setting Pacing Pulse Width: 0.4 ms
Lead Channel Setting Sensing Sensitivity: 0.5 mV
Pulse Gen Serial Number: 652491
Zone Setting Status: 755011

## 2024-06-01 ENCOUNTER — Telehealth: Payer: Self-pay | Admitting: Internal Medicine

## 2024-06-01 ENCOUNTER — Ambulatory Visit: Payer: Self-pay | Admitting: Cardiology

## 2024-06-01 NOTE — Progress Notes (Deleted)
 Cardiology Clinic Note   Date: 06/01/2024 ID: Fulton Job, DOB 1996/12/14, MRN [id number]  Primary Cardiologist:  Constancia Delton, MD  Chief Complaint   Robert Mccann is a 28 y.o. male who presents to the clinic today for ***  Patient Profile   Robert Mccann is followed by *** for the history outlined below.      Past medical history significant for: Chronic systolic heart failure/nonischemic cardiomyopathy. Coronary CTA 08/20/2022: Calcium  score 0.  No evidence of CAD. ICD implantation 02/25/2023. Remote device check 02/26/2024: Normal device function.  Battery and lead parameters stable.  Device programming appropriate. Echo 11/21/2023: EF 35 to 40%.  Mild global hypokinesis with severe hypokinesis of the anterior and anteroseptal wall.  Grade I DD.  Normal RV size/function.  Normal PA pressure*** Echo 04/20/2024: EF 35 to 40%.  Wall motion unable to be evaluated.  Mildly dilated LV internal cavity.  Mild LVH.  Indeterminate diastolic parameters.  Mildly reduced RV function, normal RV size. Mobitz 2 heart block. LV thrombus. Cardiac MRI 03/19/2022: Severe LV dilatation with severe systolic dysfunction, EF 11%.  Moderate RV dilatation with severe systolic dysfunction, EF 19%.  Large LV thrombus adjacent to anterior/anteroseptal walls 58 mm x 27 mm.  Smaller LV thrombus adjacent to inferior wall 39 mm x 9 mm.  Hypertrabeculation of LV that meets criteria for noncompaction though hypertrabeculation can also be seen in dilated cardiomyopathies.  LGE primarily in the noncompaction regions in the basal to apical anterior wall, apex, and apical to mid inferior wall but appears to extend into compacted subendocardial particularly in the apical inferior wall and apex.  Given subendocardial LGE would recommend ruling out obstructive CAD but subendocardial LGE can also be seen in noncompaction.  Moderate pericardial effusion adjacent to LV inferior wall.  Moderate TR.  Mild  MR. Hypertension. Hyperlipidemia. Lipid panel 03/13/2024: LDL 67, HDL 42, TG 124, total 131. CVA. PE. Pulmonary thrombectomy 06/28/2021. T2DM.  In summary, patient initially presented to [facility] in June 2022 with chest pain and dyspnea.  CT of the chest revealed pulmonary emboli extending from the lobar arteries of the right upper and lower lobes and throughout there is segmental branches.  He subsequently underwent thrombectomy and thrombolysis of the right upper/middle/lower lobes.  He was treated with heparin  and subsequently Eliquis .  Lower extremity ultrasound was negative for DVT though it was noted that the patient previously spent 12 hours each way to [facility] and back about a week or 2 prior to admission.  Echo at that time showed EF <20% with global hypokinesis, Grade II DD, severely reduced RV function.  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  During hospital admission he was noted to have intermittent Mobitz 2 heart block during periods of sleep.  GDMT was started at time of discharge.  At follow-up July 2022 he was only taking carvedilol , Eliquis , low-dose Lasix  as he was not able to afford Farxiga  and Entresto .  He was provided with samples and co-pay card for Entresto .  At follow-up in September 2022 was placed on spironolactone  and Lasix  was switched to torsemide .  In November 2022 patient was admitted to [facility] with his 2 to 3-day history of dysarthria and left hand numbness/weakness.  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  TEE did not show LA/LAA/LV thrombus and bubble study was negative.  He reported compliance with Eliquis  which was held in the setting of areas of petechial hemorrhage on follow-up CT.  There was a discussion of switching to Coumadin  but patient preferred to remain on Eliquis  and this was resumed once cleared by neurology.  Upon follow-up in January 2023 patient continued with some aphasia and  left-sided visual neglect.  In March 2023 patient underwent hospital admission at [facility] for recurrent PE in the setting of being off anticoagulation for 2 weeks.  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  He was improving symptomatically but labs were concerning for multiorgan dysfunction.  He was transferred to [facility] for further management with advanced heart failure service.  Cardiac MRI was performed showing LV thrombus and noncompaction as described above.  Patient followed up in the office with Dr. Junnie Olives in June 2023 with complaints of bradycardia.  He had self increased Lasix  as he felt he was not urinating enough. He had not been taking carvedilol  secondary to previous heart block.  He was instructed to return to prior dose of Lasix .  He was seen by heart failure clinic and Lasix  was increased.  He underwent sleep study which did not reveal OSA.  Coronary CTA August 2023 showed no evidence of CAD.  Echo December 2023 showed EF 25 to 30%, global hypokinesis, LV internal cavity moderately dilated, normal diastolic parameters, abnormal global strain, normal RV size/function, mild LAE, moderately dilated pulmonary artery.  Patient was evaluated by Dr. Marven Slimmer on 10/10/2022 for consideration of ICD.  He underwent ICD implantation February 2024.   Patient was last seen by Dr. Julane Ny in advanced heart failure clinic on 03/13/2024.  He was doing well at that time with no complaints.  He continued going to school for HVAC.  He was referred to genetic counseling as he and his wife are considering having kids.  Echo was repeated as detailed above.      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  Remote device check February 2025 showed normal device function with stable battery and lead  parameters and appropriate device programming.  Echo April 2024 showed EF 35 to 40%, mild LVH, mildly reduced RV function.  Patient denies lower extremity edema, orthopnea or PND.  Euvolemic and well compensated on exam.*** -Continue torsemide , spironolactone , Entresto , Toprol , Farxiga .   LV thrombus Large LV thrombus on echo and cardiac MRI April 2023.  Patient denies spontaneous bleeding concerns.*** -Continue Eliquis .   Hypertension BP today***. No reports of headaches or dizziness.  -Continue Entresto , spironolactone , Toprol .   Hyperlipidemia LDL 67 March 2025, at goal. -Continue rosuvastatin .    Weight loss Patient is on Ozempic  and recently joined [employer]. He is very motivated to lose weight.*** -Consider tracking food to determine daily caloric intake.  -Continue to advance physical activity as tolerated.   ROS: All other systems reviewed and are otherwise negative except as noted in History of Present Illness.  EKGs/Labs Reviewed        03/13/2024: ALT 39; AST 34; BUN 13; Creatinine, Ser 1.00; Potassium 4.3; Sodium 141   08/28/2023: Hemoglobin 15.4; WBC 5.7   No results found for requested labs within last 365 days.   03/13/2024: BNP 12.4  ***  Risk Assessment/Calculations    {Does this patient have ATRIAL FIBRILLATION?[phone]} No BP recorded.  {Refresh Note OR Click here to enter BP  :1}***        Physical Exam    VS:  There were no vitals taken for this  visit. , BMI There is no height or weight on file to calculate BMI.  GEN: Well nourished, well developed, in no acute distress. Neck: No JVD or carotid bruits. Cardiac: *** RRR. *** No murmurs. No rubs or gallops.   Respiratory:  Respirations regular and unlabored. Clear to auscultation without rales, wheezing or rhonchi. GI: Soft, nontender, nondistended. Extremities: Radials/DP/PT 2+ and equal bilaterally. No clubbing or cyanosis. No edema ***  Skin: Warm and dry, no rash. Neuro: Strength  intact.  Assessment & Plan   ***  Disposition: ***     {Are you ordering a CV Procedure (e.g. stress test, cath, DCCV, TEE, etc)?   Press F2        :[id number]}   Signed, Lonell Rives. Torrian Canion, DNP, NP-C

## 2024-06-01 NOTE — Telephone Encounter (Signed)
 Called to confirm/remind patient of their appointment at the Advanced Heart Failure Clinic on 06/02/24.   Appointment:   [x] Confirmed  [] Left mess   [] No answer/No voice mail  [] VM Full/unable to leave message  [] Phone not in service  Patient reminded to bring all medications and/or complete list.  Confirmed patient has transportation. Gave directions, instructed to utilize valet parking.

## 2024-06-02 ENCOUNTER — Other Ambulatory Visit: Payer: Self-pay

## 2024-06-02 ENCOUNTER — Ambulatory Visit: Admitting: Student

## 2024-06-02 ENCOUNTER — Other Ambulatory Visit: Payer: Self-pay | Admitting: Physician Assistant

## 2024-06-02 ENCOUNTER — Encounter: Payer: Self-pay | Admitting: Internal Medicine

## 2024-06-02 ENCOUNTER — Ambulatory Visit: Attending: Internal Medicine | Admitting: Internal Medicine

## 2024-06-02 VITALS — BP 133/89 | HR 72 | Wt 372.8 lb

## 2024-06-02 DIAGNOSIS — E1169 Type 2 diabetes mellitus with other specified complication: Secondary | ICD-10-CM

## 2024-06-02 DIAGNOSIS — I3139 Other pericardial effusion (noninflammatory): Secondary | ICD-10-CM | POA: Diagnosis not present

## 2024-06-02 DIAGNOSIS — R200 Anesthesia of skin: Secondary | ICD-10-CM | POA: Diagnosis not present

## 2024-06-02 DIAGNOSIS — Z86718 Personal history of other venous thrombosis and embolism: Secondary | ICD-10-CM | POA: Diagnosis not present

## 2024-06-02 DIAGNOSIS — I2699 Other pulmonary embolism without acute cor pulmonale: Secondary | ICD-10-CM | POA: Diagnosis not present

## 2024-06-02 DIAGNOSIS — E119 Type 2 diabetes mellitus without complications: Secondary | ICD-10-CM | POA: Insufficient documentation

## 2024-06-02 DIAGNOSIS — R471 Dysarthria and anarthria: Secondary | ICD-10-CM | POA: Diagnosis not present

## 2024-06-02 DIAGNOSIS — Z6841 Body Mass Index (BMI) 40.0 and over, adult: Secondary | ICD-10-CM | POA: Insufficient documentation

## 2024-06-02 DIAGNOSIS — Z86711 Personal history of pulmonary embolism: Secondary | ICD-10-CM | POA: Diagnosis not present

## 2024-06-02 DIAGNOSIS — Z7985 Long-term (current) use of injectable non-insulin antidiabetic drugs: Secondary | ICD-10-CM | POA: Diagnosis not present

## 2024-06-02 DIAGNOSIS — R233 Spontaneous ecchymoses: Secondary | ICD-10-CM | POA: Insufficient documentation

## 2024-06-02 DIAGNOSIS — M1712 Unilateral primary osteoarthritis, left knee: Secondary | ICD-10-CM

## 2024-06-02 DIAGNOSIS — I5022 Chronic systolic (congestive) heart failure: Secondary | ICD-10-CM | POA: Diagnosis not present

## 2024-06-02 DIAGNOSIS — I428 Other cardiomyopathies: Secondary | ICD-10-CM

## 2024-06-02 DIAGNOSIS — Z8673 Personal history of transient ischemic attack (TIA), and cerebral infarction without residual deficits: Secondary | ICD-10-CM | POA: Insufficient documentation

## 2024-06-02 DIAGNOSIS — I5082 Biventricular heart failure: Secondary | ICD-10-CM | POA: Diagnosis present

## 2024-06-02 DIAGNOSIS — R531 Weakness: Secondary | ICD-10-CM | POA: Insufficient documentation

## 2024-06-02 DIAGNOSIS — Z7901 Long term (current) use of anticoagulants: Secondary | ICD-10-CM | POA: Diagnosis not present

## 2024-06-02 DIAGNOSIS — Z7984 Long term (current) use of oral hypoglycemic drugs: Secondary | ICD-10-CM | POA: Diagnosis not present

## 2024-06-02 DIAGNOSIS — I110 Hypertensive heart disease with heart failure: Secondary | ICD-10-CM | POA: Diagnosis present

## 2024-06-02 DIAGNOSIS — I10 Essential (primary) hypertension: Secondary | ICD-10-CM

## 2024-06-02 DIAGNOSIS — E66813 Obesity, class 3: Secondary | ICD-10-CM

## 2024-06-02 DIAGNOSIS — Z79899 Other long term (current) drug therapy: Secondary | ICD-10-CM | POA: Insufficient documentation

## 2024-06-02 MED ORDER — DAPAGLIFLOZIN PROPANEDIOL 10 MG PO TABS
10.0000 mg | ORAL_TABLET | Freq: Every day | ORAL | 0 refills | Status: DC
  Filled 2024-06-02: qty 90, 90d supply, fill #0

## 2024-06-02 MED ORDER — ZEPBOUND 2.5 MG/0.5ML ~~LOC~~ SOAJ
2.5000 mg | SUBCUTANEOUS | 0 refills | Status: DC
Start: 2024-06-02 — End: 2024-07-07
  Filled 2024-06-02 – 2024-06-12 (×3): qty 2, 28d supply, fill #0

## 2024-06-02 MED ORDER — TORSEMIDE 20 MG PO TABS
40.0000 mg | ORAL_TABLET | ORAL | 1 refills | Status: DC
  Filled 2024-06-02 – 2024-07-23 (×2): qty 140, 140d supply, fill #0
  Filled 2024-09-03: qty 140, 30d supply, fill #0
  Filled 2024-10-29: qty 140, 30d supply, fill #1

## 2024-06-02 NOTE — Progress Notes (Signed)
 Advanced Heart Failure Clinic Note  Primary Care: Leopoldo Rancher, PA General Cardiologist: Dr. Junnie Olives HF Cardiologist: Dr. Julane Ny  CC: Stage C/D Biventricular failure  HPI: Robert Mccann is a 28 y.o. 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  Admitted 6/22 to [facility] with PE. Underwent thrombectomy/thrombolysis of the right upper/middle/lower lobes. Dopplers negative for DVT. Echo EF <  20% with global hypokinesis and severely reduced RV function. 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    Admitted 11/22 with dysarthria and left hand numbness/weakness. Found to have a large acute posterior right MCA territory infarct. TEE did not show any LA/LAA/LV thrombus, and bubble study was negative.  He reported compliance with Eliquis , which was held in the setting of areas of petechial hemorrhage on follow-up CT.  Unable to complete coronary CTA due to inability to maintain adequate heart rate.   Admitted [facility] 3/23 with CP and SOB. CT chest with small LUL PE, RA clot. Echo EF <20% severe RV dysfunction large LV clot. Transferred to [facility] for further management. No R/LHC with RA and LV clot. cMRI with LVEF 11%, RVEF 19%, large LV thrombus, criteria for LV non-compaction met, subendocardial LGE which can be seen in CAD and noncompaction, moderate pericardial effusion, moderate TR, mild MR. Milrinone  weaned and GDMT titrated, no beta blocker with wenckebach AV block. Not currently candidate for advanced therapies due to size and noncompliance. Discharged home, weight 309 lbs.  Follow up 4/23, Echo showed EF <20% moderate RV dysfunction large LV clot. Entresto  increased and sleep study arranged.  Admitted 5/23 with rectal bleeding. Eliquis  held. GI consulted, underwent colonoscopy showing significant bleeding from anal fissure. Had hemorrhoids,  diverticulosis and 1 polyp that was resected and clipped. Eliquis  resumed before discharge.   Echo 8/23: EF 25-30% RV ok   Echo 12/23 EF 25-30%, LV with GHK, normal RV, LA mildly dilated, no MR   S/p boston scientific dual chamber ICD 02/25/22 by Dr. Marven Slimmer.  Echo 4/25 EF 35-40% (I felt 30-35%) Personally reviewed  Here for f/u with his wife. Feels good. 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 Weight fluctuates daily. Now torsemide  40 lasix  daily. Was taking 40 alternating with 60 which he felt worked better. Had Genetics w/u which was + for MHY-7  ICD interrogated: HL score 0 No VT/AF. Activity level 0.9hr/day Personally reviewed   Cardiac Studies: - Echo 11/21/23: EF 30% with global hypokinesis.  - Echo 12/23 EF 25-30%, LV with GHK, normal RV, LA mildly dilated, no MR  - Echo 8/23: EF 25-30% RV ok  - Sleep study 8/23 AHI 3.4  - Echo (4/23): EF < 20%, moderate RV dysfunction with large LV clot. - cMRI (3/23): with LVEF 11%, RVEF 19%, large LV thrombus, criteria for LV non-compaction met, subendocardial LGE which can be seen in CAD and noncompaction, moderate pericardial effusion, moderate TR, mild MR.  - Echo (3/23): EF <20% severe RV dysfunction large LV clot. - TEE (11/22): EF 20-25%, global HK LV, severe RV dysfunction, no clot, bubble study negative. - Echo (6/22): EF < 20% with global hypokinesis and severely reduced RV function.   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 05/2021 noted during periods of sleep @ time of hospitalization for PE.   Cardiomyopathy (HCC)    a. 05/2021 Echo: EF <20%, glob HK. Beaver:C ratio of 2.1:1 suggesting non-compaction.  Gr2 DD. Sev reduced RV fxn. Nl PASP. Mildly dil RA. Mild MR; b. 10/2021 Echo: EF <20%, GrII DD; c. 10/2021 TEE: EF 20-25%, glob HK, sev red RV fxn.   Diabetes mellitus without complication (HCC)    Erythrocytosis 05/30/2022   HFrEF (heart failure with reduced ejection fraction) (HCC)    a. 05/2021 Echo: EF <20%. ?  non-compaction.   Hyperlipidemia    Hypertension    LV (left ventricular) mural thrombus    Pulmonary embolism (HCC)    a. 05/2021 CTA Chest: PE extending from lobar arteris of RUL and RLL w/ concern for pulml infarct-->s/p thrombolysis and thrombectomy of RUL/RML/RLL.   Stroke [facility])    a. 10/2021 CT Head: Bilat cerebral infarcts w/ mod-sided acute to subacute R temporoparietal infarct; b. 10/2021 MRI: R post PCA territory infarcts w/o hemorrhage; c. 10/2021 TEE: Neg bubble study. No LA/LAA thrombus. EF 20-25%, glob HK.   Current Outpatient Medications  Medication Sig Dispense Refill   apixaban  (ELIQUIS ) 5 MG TABS tablet Take 1 tablet (5 mg total) by mouth 2 (two) times daily. 180 tablet 3   Blood Glucose Monitoring Suppl (BLOOD GLUCOSE MONITOR SYSTEM) w/Device KIT Use up to four times daily as directed. 1 kit 3   dapagliflozin  propanediol (FARXIGA ) 10 MG TABS tablet Take 1 tablet (10 mg total) by mouth daily. 90 tablet 0   ezetimibe  (ZETIA ) 10 MG tablet Take 1 tablet (10 mg total) by mouth daily. 90 tablet 2   Fluocinolone  Acetonide Body 0.01 % OIL Apply 1 Application topically 2 (two) times daily as needed. Apply to scalp, face, beard up to 2 times a day as needed for scaling, itching, redness. 118 mL 3   furosemide  (LASIX ) 40 MG tablet Take by mouth.     ketoconazole  (NIZORAL ) 2 % shampoo Apply topically 2 (two) times a week. 120 mL 3   metFORMIN  (GLUCOPHAGE ) 500 MG tablet Take 1 tablet (500 mg total) by mouth 2 (two) times daily with a meal. 180 tablet 1   metoprolol  succinate (TOPROL -XL) 100 MG 24 hr tablet Take 1 tablet (100 mg total) by mouth daily. 30 tablet 3   potassium chloride  (KLOR-CON ) 20 MEQ packet Dissolve and take 60 mEq (3 packets) by mouth daily. 90 packet 1   rosuvastatin  (CRESTOR ) 40 MG tablet Take 1 tablet (40 mg total) by mouth daily. 90 tablet 1   sacubitril -valsartan  (ENTRESTO ) 97-103 MG Take 1 tablet by mouth 2 (two) times daily. 180 tablet 1   Semaglutide , 2 MG/DOSE,  (OZEMPIC , 2 MG/DOSE,) 8 MG/3ML SOPN Inject 2 mg into the skin once a week. 3 mL 3   sildenafil  (VIAGRA ) 50 MG tablet Take 1 tablet (50 mg total) by mouth daily as needed for erectile dysfunction. 10 tablet 0   spironolactone  (ALDACTONE ) 25 MG tablet Take 1 tablet (25 mg total) by mouth daily. 90 tablet 0   torsemide  (DEMADEX ) 20 MG tablet Take 2 tablets (40 mg total) by mouth every other day alternating with 3 tablets (60 mg total) every other day. 140 tablet 1   No current facility-administered medications for this visit.   No Known Allergies  Social History   Socioeconomic History   Marital status: Single    Spouse name: Not on file   Number of children: 0   Years of education: Not on file   Highest education level: Not on file  Occupational History   Not on file  Tobacco Use   Smoking status: Never   Smokeless tobacco: Never  Vaping Use   Vaping status: Never Used  Substance and Sexual Activity   Alcohol use: Not Currently   Drug use: Not Currently    Types: Marijuana   Sexual activity: Yes    Partners: Female  Other Topics Concern   Not on file  Social History Narrative   Smokes weed; No smoking cigarettes; no alcohol. 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 Lives in [place].  NO children.    Social Drivers of Corporate investment banker Strain: Not on file  Food Insecurity: No Food Insecurity (03/12/2024)   Hunger Vital Sign    Worried About Running Out of Food in the Last Year: Never true    Ran Out of Food in the Last Year: Never true  Transportation Needs: No Transportation Needs (03/12/2024)   PRAPARE - Administrator, Civil Service (Medical): No    Lack of Transportation (Non-Medical): No  Physical Activity: Not on file  Stress: Not on file  Social Connections: Not on file  Intimate Partner Violence: Not At Risk (03/12/2024)   Humiliation, Afraid, Rape, and Kick questionnaire    Fear of Current or Ex-Partner: No    Emotionally Abused: No     Physically Abused: No    Sexually Abused: No   Family History  Problem Relation Age of Onset   Stroke Mother    Diabetes Mother    Heart disease Mother    Heart failure Mother    Cerebrovascular Accident Mother    Diabetes Father    Hypertension Father    Heart disease Maternal Uncle    Diabetes Paternal Uncle    Heart disease Maternal Grandmother    Diabetes Maternal Grandfather    Hypertension Paternal Grandmother    Diabetes Paternal Grandfather    Cancer Paternal Grandfather    Cancer Cousin        unknown origin   Cancer Cousin        unknown origing   BP 133/89   Pulse 72   Wt (!) 372 lb 12.8 oz (169.1 kg)   SpO2 98%   BMI 52.00 kg/m   Wt Readings from Last 3 Encounters:  06/02/24 (!) 372 lb 12.8 oz (169.1 kg)  04/13/24 (!) 358 lb (162.4 kg)  04/02/24 (!) 367 lb 6.4 oz (166.7 kg)   PHYSICAL EXAM: Vitals:   06/02/24 0919  BP: 133/89  Pulse: 72  SpO2: 98%   General:  Well appearing. No resp difficulty HEENT: normal Neck: supple. no JVD. Carotids 2+ bilat; no bruits. No lymphadenopathy or thryomegaly appreciated. Cor: PMI nondisplaced. Regular rate & rhythm. No rubs, gallops or murmurs. Lungs: clear Abdomen: obese soft, nontender, nondistended. No hepatosplenomegaly. No bruits or masses. Good bowel sounds. Extremities: no cyanosis, clubbing, rash, edema Neuro: alert & orientedx3, cranial nerves grossly intact. moves all 4 extremities w/o difficulty. Affect pleasant   Device interrogation: As above  ASSESSMENT & PLAN: 1. 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 No CAD  CAC = 0. - cMRI (3/23): LVEF 11%, RVEF 19%, large LV thrombus, criteria for LV non-compaction met, subendocardial LGE which can be seen in CAD and noncompaction, moderate pericardial effusion, moderate TR, mild MR - Echo (4/23) EF <20% moderate RV dysfunction large LV clot. - Echo  8/23: EF 25-30% RV ok  - Echo 12/23 EF 25-30%, LV with GHK, normal RV, LA mildly dilated, no MR  -  Echo 11/21/23: EF 25%-30% - Genetic testing with 2 genes of uncertain significance. His mother tested postive for MYH7 genetic mutation.  - S/p boston scientific dual chamber ICD 02/25/22 by Dr. Marven Slimmer. - ICD interrogated: HL score 0 No VT/AF. Activity level 0.9hr/day Personally reviewed - Doing well. NYHA I - Increase torsemide  40 every other day with 60 every other day.  - Continue Entresto  97/103 mg bid. - Continue spironolactone  25 mg daily. - Continue Farxiga  10 mg daily. - Continue toprol  100mg  daily - Repeat echo and labs - We once again had an extensive discussion on the importance of weight loss with regards to transplant. I suspect he will eventually have Stage D, NYHA IV - Has been seen by [facility]. He has MYH7 genetic cardiomyopathy. (Mother has it too) - GDMT optimized - Check labs today and in 2 weeks  2. 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 - Large LV thrombus on echo and cMRI.in 4/23 - Continue apixaban  life long while EF down - Resolved on recent echo  3. Pericardial effusion - Moderate in size on cMRI. - Trivial on echo 4/23. - Resolved on recent echo   4. History of PE s/p thrombectomy and thrombolysis with recurrent new PEs most recent admission - Continue Eliquis    5. History of right posterior PCA territory infarcts (cardioembolic) - At high risk for recurrent events with large LV thrombus.and LVNC - Continue Eliquis  and statin.  6. DM2 - Per PCP - Last A1 7.5 - Continue SGLT2i and GLP1RA.   7. Morbid obesity - Body mass index is 52 kg/m. - On Ozempic  but not losing weight. Working on getting Mounjaro   8. Snoring - sleep study 8/23 AHI 3.4   Jules Oar, MD  9:39 AM

## 2024-06-02 NOTE — Patient Instructions (Signed)
 Medication Changes:  INCREASE Torsemide  to 40mg  (2 tab) alternating every other day with 60mg  (3 tabs)  Lab Work:  Go DOWN to LOWER LEVEL (LL) to have your blood work completed inside of [employer] office TODAY.  Go over to the MEDICAL MALL. Go pass the gift shop and have your blood work completed IN TWO WEEKS.   We will only call you if the results are abnormal or if the provider would like to make medication changes.  Follow-Up in: Please follow up with the Advanced Heart Failure Clinic in 2 months with Dr. Julane Ny. We do not currently have that schedule. Please give us  a call in July in order to schedule your appointment for August.  At the Advanced Heart Failure Clinic, you and your health needs are our priority. We have a designated team specialized in the treatment of Heart Failure. This Care Team includes your primary Heart Failure Specialized Cardiologist (physician), Advanced Practice Providers (APPs- Physician Assistants and Nurse Practitioners), and Pharmacist who all work together to provide you with the care you need, when you need it.   You may see any of the following providers on your designated Care Team at your next follow up:  Dr. Jules Oar Dr. Peder Bourdon Dr. Alwin Baars Dr. Judyth Nunnery Shawnee Dellen, FNP Bevely Brush, RPH-CPP  Please be sure to bring in all your medications bottles to every appointment.   Need to Contact Us :  If you have any questions or concerns before your next appointment please send us  a message through [place] or call our office at [phone].    TO LEAVE A MESSAGE FOR THE NURSE SELECT OPTION 2, PLEASE LEAVE A MESSAGE INCLUDING: YOUR NAME DATE OF BIRTH CALL BACK NUMBER REASON FOR CALL**this is important as we prioritize the call backs  YOU WILL RECEIVE A CALL BACK THE SAME DAY AS LONG AS YOU CALL BEFORE 4:00 PM

## 2024-06-02 NOTE — Addendum Note (Signed)
 Addended by: Autry Legions A on: 06/02/2024 10:33 AM   Modules accepted: Orders

## 2024-06-03 LAB — BASIC METABOLIC PANEL WITH GFR
BUN/Creatinine Ratio: 11 (ref 9–20)
BUN: 11 mg/dL (ref 6–20)
CO2: 21 mmol/L (ref 20–29)
Calcium: 9.4 mg/dL (ref 8.7–10.2)
Chloride: 103 mmol/L (ref 96–106)
Creatinine, Ser: 1.01 mg/dL (ref 0.76–1.27)
Glucose: 108 mg/dL — ABNORMAL HIGH (ref 70–99)
Potassium: 4.9 mmol/L (ref 3.5–5.2)
Sodium: 138 mmol/L (ref 134–144)
eGFR: 103 mL/min/{1.73_m2} (ref >59)

## 2024-06-08 ENCOUNTER — Telehealth: Payer: Self-pay | Admitting: Physician Assistant

## 2024-06-08 ENCOUNTER — Other Ambulatory Visit: Payer: Self-pay

## 2024-06-08 ENCOUNTER — Telehealth: Payer: Self-pay

## 2024-06-08 NOTE — Telephone Encounter (Signed)
 Please complete PA for Zepbound  2.5 MG.  KP Copied from CRM [id number]. Topic: Clinical - Medication Prior Auth >> Jun 08, 2024  4:23 PM Loreda Rodriguez T wrote: Reason for CRM: patient called stated he needs PA for ZEPBOUND  2.5 MG/0.5ML Pen.

## 2024-06-08 NOTE — Telephone Encounter (Signed)
 Please initiate prior auth for Zepbound  due to class 3 obesity with serious comorbidities including diabetes type 2, hypertension, hyperlipidemia, and chronic heart failure.

## 2024-06-09 ENCOUNTER — Other Ambulatory Visit (HOSPITAL_COMMUNITY): Payer: Self-pay

## 2024-06-09 ENCOUNTER — Telehealth: Payer: Self-pay | Admitting: Pharmacy Technician

## 2024-06-09 NOTE — Telephone Encounter (Signed)
 PA request has been Submitted. New Encounter has been or will be created for follow up. For additional info see Pharmacy Prior Auth telephone encounter from 06/09/24.

## 2024-06-09 NOTE — Telephone Encounter (Signed)
 Pharmacy Patient Advocate Encounter   Received notification from Pt Calls Messages that prior authorization for Zepbound  2.5MG /0.5ML pen-injectors is required/requested.   Insurance verification completed.   The patient is insured through [facility] [place] .   Per test claim: PA required; PA submitted to above mentioned insurance via CoverMyMeds Key/confirmation #/EOC [facility] Status is pending

## 2024-06-09 NOTE — Telephone Encounter (Signed)
 Noted  KP

## 2024-06-09 NOTE — Telephone Encounter (Signed)
 Please review. PA denied for Zepbound . Please let me know next steps for patient.

## 2024-06-09 NOTE — Telephone Encounter (Signed)
 Message already sent to [facility] with denial.  KP

## 2024-06-09 NOTE — Telephone Encounter (Signed)
 Copied from CRM [id number]. Topic: Clinical - Medication Prior Auth >> Jun 09, 2024  2:34 PM Sophia H wrote: Reason for CRM: patient is calling in today because he states he is needing a prior authorization on his ZEPBOUND  2.5 MG/0.5ML Pen. Please advise, [phone].

## 2024-06-09 NOTE — Telephone Encounter (Signed)
 Pharmacy Patient Advocate Encounter  Received notification from [facility] Medicaid that Prior Authorization for Zepbound  2.5MG /0.5ML pen-injectors has been DENIED.  Full denial letter will be uploaded to the media tab. See denial reason below.     PA #/Case ID/Reference #: [id number]

## 2024-06-11 ENCOUNTER — Telehealth: Payer: Self-pay | Admitting: Physician Assistant

## 2024-06-11 ENCOUNTER — Telehealth: Payer: Self-pay | Admitting: Pharmacist

## 2024-06-11 ENCOUNTER — Other Ambulatory Visit (HOSPITAL_COMMUNITY): Payer: Self-pay

## 2024-06-11 NOTE — Telephone Encounter (Signed)
 Appeal has been submitted for Zepbound . Will advise when response is received or follow up in 1 week. Please be advised that most companies may take 30 days to make a decision. Appeal letter and supporting documentation have been faxed to [phone] on 06/11/2024 @ 4:32 pm.  Thank you, Dene Fines, PharmD Clinical Pharmacist  [place]  Direct Dial: [phone]

## 2024-06-11 NOTE — Telephone Encounter (Signed)
 Spoke with patient to make sure that he still has GLP-1 RA medication while we work on his Zepbound  appeal.  He confirmed that he has roughly 3 weeks worth of Ozempic  2 mg.  Advised to continue this for now.

## 2024-06-11 NOTE — Telephone Encounter (Signed)
 We will appeal and please keep in mind appeals can take up to 30 days, but we will try for expedited appeal.

## 2024-06-12 ENCOUNTER — Other Ambulatory Visit (HOSPITAL_COMMUNITY): Payer: Self-pay

## 2024-06-12 ENCOUNTER — Other Ambulatory Visit: Payer: Self-pay

## 2024-06-12 NOTE — Telephone Encounter (Signed)
 The appeal for Zepbound  has been approved for the patient by the insurance company:    Thank you, Dene Fines, PharmD Clinical Pharmacist  [place]  Direct Dial: [phone]

## 2024-06-21 ENCOUNTER — Emergency Department

## 2024-06-21 ENCOUNTER — Emergency Department
Admission: EM | Admit: 2024-06-21 | Discharge: 2024-06-21 | Disposition: A | Discharge status: Home / Self Care | Attending: Emergency Medicine | Admitting: Emergency Medicine

## 2024-06-21 DIAGNOSIS — I5020 Unspecified systolic (congestive) heart failure: Secondary | ICD-10-CM | POA: Diagnosis not present

## 2024-06-21 DIAGNOSIS — E119 Type 2 diabetes mellitus without complications: Secondary | ICD-10-CM | POA: Diagnosis not present

## 2024-06-21 DIAGNOSIS — Z7984 Long term (current) use of oral hypoglycemic drugs: Secondary | ICD-10-CM | POA: Insufficient documentation

## 2024-06-21 DIAGNOSIS — Z7901 Long term (current) use of anticoagulants: Secondary | ICD-10-CM | POA: Diagnosis not present

## 2024-06-21 DIAGNOSIS — Z95810 Presence of automatic (implantable) cardiac defibrillator: Secondary | ICD-10-CM | POA: Insufficient documentation

## 2024-06-21 DIAGNOSIS — S93401A Sprain of unspecified ligament of right ankle, initial encounter: Secondary | ICD-10-CM | POA: Diagnosis not present

## 2024-06-21 DIAGNOSIS — D68318 Other hemorrhagic disorder due to intrinsic circulating anticoagulants, antibodies, or inhibitors: Secondary | ICD-10-CM | POA: Insufficient documentation

## 2024-06-21 DIAGNOSIS — X503XXA Overexertion from repetitive movements, initial encounter: Secondary | ICD-10-CM | POA: Diagnosis not present

## 2024-06-21 DIAGNOSIS — Y93A1 Activity, exercise machines primarily for cardiorespiratory conditioning: Secondary | ICD-10-CM | POA: Insufficient documentation

## 2024-06-21 DIAGNOSIS — I110 Hypertensive heart disease with heart failure: Secondary | ICD-10-CM | POA: Insufficient documentation

## 2024-06-21 DIAGNOSIS — S99911A Unspecified injury of right ankle, initial encounter: Secondary | ICD-10-CM | POA: Diagnosis present

## 2024-06-21 DIAGNOSIS — Z79899 Other long term (current) drug therapy: Secondary | ICD-10-CM | POA: Diagnosis not present

## 2024-06-21 MED ORDER — OXYCODONE HCL 5 MG PO TABS
5.0000 mg | ORAL_TABLET | Freq: Once | ORAL | Status: AC
  Administered 2024-06-21: 5 mg via ORAL
  Filled 2024-06-21: qty 1

## 2024-06-21 MED ORDER — OXYCODONE HCL 5 MG PO TABS: 5.0000 mg | ORAL_TABLET | Freq: Three times a day (TID) | ORAL | 0 refills | Status: DC | PRN

## 2024-06-21 NOTE — ED Provider Notes (Signed)
 [facility] Provider Note    Event Date/Time   First MD Initiated Contact with Patient 06/21/24 0210     (approximate)   History   Ankle Pain   HPI  Robert Mccann is a 28 y.o. male with history of cardiomyopathy status post ICD, Mobitz type II, hypertension, hyperlipidemia, PE on Eliquis , stroke, diabetes, obesity who presents to the emergency department complaints of right ankle pain after using the treadmill yesterday.  He denies any falls, twisting his ankle.  States pain worse with ambulation.  No calf tenderness or calf swelling.  Took Tylenol  prior to arrival.   History provided by patient, family.    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 05/2021 noted during periods of sleep @ time of hospitalization for PE.   Cardiomyopathy (HCC)    a. 05/2021 Echo: EF <20%, glob HK. :C ratio of 2.1:1 suggesting non-compaction. Gr2 DD. Sev reduced RV fxn. Nl PASP. Mildly dil RA. Mild MR; b. 10/2021 Echo: EF <20%, GrII DD; c. 10/2021 TEE: EF 20-25%, glob HK, sev red RV fxn.   Diabetes mellitus without complication (HCC)    Erythrocytosis 05/30/2022   HFrEF (heart failure with reduced ejection fraction) (HCC)    a. 05/2021 Echo: EF <20%. ? non-compaction.   Hyperlipidemia    Hypertension    LV (left ventricular) mural thrombus    Pulmonary embolism (HCC)    a. 05/2021 CTA Chest: PE extending from lobar arteris of RUL and RLL w/ concern for pulml infarct-->s/p thrombolysis and thrombectomy of RUL/RML/RLL.   Stroke [facility])    a. 10/2021 CT Head: Bilat cerebral infarcts w/ mod-sided acute to subacute R temporoparietal infarct; b. 10/2021 MRI: R post PCA territory infarcts w/o hemorrhage; c. 10/2021 TEE: Neg bubble study. No LA/LAA thrombus. EF 20-25%, glob HK.    Past Surgical History:  Procedure Laterality Date   COLONOSCOPY N/A 05/18/2022   Procedure: COLONOSCOPY;  Surgeon: Federico Rosario BROCKS, MD;  Location: [facility] ENDOSCOPY;  Service:  Gastroenterology;  Laterality: N/A;   HAND SURGERY Left age 38   HAND SURGERY     HEMOSTASIS CLIP PLACEMENT  05/18/2022   Procedure: HEMOSTASIS CLIP PLACEMENT;  Surgeon: Federico Rosario BROCKS, MD;  Location: [facility] ENDOSCOPY;  Service: Gastroenterology;;   ICD IMPLANT N/A 02/25/2023   Procedure: ICD IMPLANT;  Surgeon: Cindie Ole DASEN, MD;  Location: [facility] INVASIVE CV LAB;  Service: Cardiovascular;  Laterality: N/A;   POLYPECTOMY  05/18/2022   Procedure: POLYPECTOMY;  Surgeon: Federico Rosario BROCKS, MD;  Location: [facility] ENDOSCOPY;  Service: Gastroenterology;;   PULMONARY THROMBECTOMY N/A 06/28/2021   Procedure: PULMONARY THROMBECTOMY;  Surgeon: Marea Selinda RAMAN, MD;  Location: [facility] INVASIVE CV LAB;  Service: Cardiovascular;  Laterality: N/A;   TEE WITHOUT CARDIOVERSION N/A 11/08/2021   Procedure: TRANSESOPHAGEAL ECHOCARDIOGRAM (TEE);  Surgeon: Darliss Rogue, MD;  Location: [facility] ORS;  Service: Cardiovascular;  Laterality: N/A;    MEDICATIONS:  Prior to Admission medications   Medication Sig Start Date End Date Taking? Authorizing Provider  apixaban  (ELIQUIS ) 5 MG TABS tablet Take 1 tablet (5 mg total) by mouth 2 (two) times daily. 05/21/24   Bensimhon, Toribio SAUNDERS, MD  Blood Glucose Monitoring Suppl (BLOOD GLUCOSE MONITOR SYSTEM) w/Device KIT Use up to four times daily as directed. 03/21/22   Arrien, Elidia Toribio, MD  dapagliflozin  propanediol (FARXIGA ) 10 MG TABS tablet Take 1 tablet (10 mg total) by mouth daily. 06/02/24   Bensimhon, Toribio SAUNDERS, MD  ezetimibe  (  ZETIA ) 10 MG tablet Take 1 tablet (10 mg total) by mouth daily. 08/07/23   Bensimhon, Toribio SAUNDERS, MD  Fluocinolone  Acetonide Body 0.01 % OIL Apply 1 Application topically 2 (two) times daily as needed. Apply to scalp, face, beard up to 2 times a day as needed for scaling, itching, redness. 04/13/24   Manya Toribio SQUIBB, PA  furosemide  (LASIX ) 40 MG tablet Take by mouth. 12/27/22   [provider]  ketoconazole  (NIZORAL ) 2 % shampoo Apply topically 2 (two) times a  week. 04/13/24   Manya Toribio SQUIBB, PA  metFORMIN  (GLUCOPHAGE ) 500 MG tablet Take 1 tablet (500 mg total) by mouth 2 (two) times daily with a meal. 10/11/23   Rolan Ezra RAMAN, MD  metoprolol  succinate (TOPROL -XL) 100 MG 24 hr tablet Take 1 tablet (100 mg total) by mouth daily. 01/27/24   Sabharwal, Aditya, DO  potassium chloride  (KLOR-CON ) 20 MEQ packet Dissolve and take 60 mEq (3 packets) by mouth daily. 05/04/24   Bensimhon, Toribio SAUNDERS, MD  rosuvastatin  (CRESTOR ) 40 MG tablet Take 1 tablet (40 mg total) by mouth daily. 08/07/23   Bensimhon, Toribio SAUNDERS, MD  sacubitril -valsartan  (ENTRESTO ) 97-103 MG Take 1 tablet by mouth 2 (two) times daily. 08/07/23   Bensimhon, Toribio SAUNDERS, MD  sildenafil  (VIAGRA ) 50 MG tablet Take 1 tablet (50 mg total) by mouth daily as needed for erectile dysfunction. 11/21/23   Sabharwal, Aditya, DO  spironolactone  (ALDACTONE ) 25 MG tablet Take 1 tablet (25 mg total) by mouth daily. 05/04/24   Bensimhon, Toribio SAUNDERS, MD  torsemide  (DEMADEX ) 20 MG tablet Take 2 tablets (40 mg total) by mouth every other day alternating with 3 tablets (60 mg total) every other day. 06/02/24   Bensimhon, Toribio SAUNDERS, MD  ZEPBOUND  2.5 MG/0.5ML Pen Inject 2.5 mg into the skin once a week. 06/02/24   Manya Toribio SQUIBB, PA    Physical Exam   Triage Vital Signs: ED Triage Vitals [06/21/24 0134]  Encounter Vitals Group     BP 118/84     Girls Systolic BP Percentile      Girls Diastolic BP Percentile      Boys Systolic BP Percentile      Boys Diastolic BP Percentile      Pulse Rate 94     Resp 20     Temp 98.6 F (37 C)     Temp Source Oral     SpO2 96 %     Weight (!) 360 lb (163.3 kg)     Height 5' 11 (1.803 m)     Head Circumference      Peak Flow      Pain Score      Pain Loc      Pain Education      Exclude from Growth Chart     Most recent vital signs: Vitals:   06/21/24 0134  BP: 118/84  Pulse: 94  Resp: 20  Temp: 98.6 F (37 C)  SpO2: 96%     CONSTITUTIONAL: Alert and responds  appropriately to questions. Well-appearing; well-nourished HEAD: Normocephalic, atraumatic EYES: Conjunctivae clear, pupils appear equal ENT: normal nose; moist mucous membranes NECK: Normal range of motion CARD: Regular rate and rhythm RESP: Normal chest excursion without splinting or tachypnea; no hypoxia or respiratory distress, speaking full sentences ABD/GI: non-distended EXT: Tender over the anterior right ankle.  No soft tissue swelling, increased redness or warmth.  No ligamentous laxity.  2+ right DP pulse and no tenderness over the calf and no calf  swelling.  No peripheral edema noted.  Normal capillary refill. SKIN: Normal color for age and race, no rashes on exposed skin NEURO: Moves all extremities equally, normal speech, no facial asymmetry noted PSYCH: The patient's mood and manner are appropriate. Grooming and personal hygiene are appropriate.  ED Results / Procedures / Treatments   LABS: (all labs ordered are listed, but only abnormal results are displayed) Labs Reviewed - No data to display   EKG:    RADIOLOGY: My personal review and interpretation of imaging: X-ray shows no acute abnormality.  I have personally reviewed all radiology reports. DG Ankle Complete Right Result Date: 06/21/2024 CLINICAL DATA:  Right ankle pain following treadmill injury, initial encounter EXAM: RIGHT ANKLE - COMPLETE 3+ VIEW COMPARISON:  None Available. FINDINGS: Mild degenerative changes of the talonavicular joint are seen. No soft tissue abnormality is seen. No acute fracture is noted. IMPRESSION: Mild degenerative change without acute abnormality. Electronically Signed   By: Oneil Devonshire M.D.   On: 06/21/2024 02:16     PROCEDURES:  Critical Care performed: No   Procedures    IMPRESSION / MDM / ASSESSMENT AND PLAN / ED COURSE  I reviewed the triage vital signs and the nursing notes.   Patient here with right ankle pain.     DIFFERENTIAL DIAGNOSIS (includes but not  limited to):   Sprain, tendinitis, fracture, no signs of septic arthritis or gout, doubt DVT.  No sign of arterial obstruction, compartment syndrome.  Patient's presentation is most consistent with acute complicated illness / injury requiring diagnostic workup.  PLAN: X-ray of the right ankle reviewed and interpreted by myself and the radiologist and shows no acute abnormality.  Patient unable to take NSAIDs given his cardiac history and being on Eliquis .  He has taken Tylenol  without relief.  Will give oxycodone here.  Will wrap with Ace wrap for support, compression.  Will give crutches to use as needed.  Recommended rest, elevation, ice and will have him follow-up with orthopedics if symptoms not improving in 1 to 2 weeks.   MEDICATIONS GIVEN IN ED: Medications  oxyCODONE (Oxy IR/ROXICODONE) immediate release tablet 5 mg (5 mg Oral Given 06/21/24 0255)     ED COURSE:  At this time, I do not feel there is any life-threatening condition present. I reviewed all nursing notes, vitals, pertinent previous records.  All lab and urine results, EKGs, imaging ordered have been independently reviewed and interpreted by myself.  I reviewed all available radiology reports from any imaging ordered this visit.  Based on my assessment, I feel the patient is safe to be discharged home without further emergent workup and can continue workup as an outpatient as needed. Discussed all findings, treatment plan as well as usual and customary return precautions.  They verbalize understanding and are comfortable with this plan.  Outpatient follow-up has been provided as needed.  All questions have been answered.    CONSULTS:  none   OUTSIDE RECORDS REVIEWED: Reviewed recent cardiology notes.     FINAL CLINICAL IMPRESSION(S) / ED DIAGNOSES   Final diagnoses:  Sprain of right ankle, unspecified ligament, initial encounter     Rx / DC Orders   ED Discharge Orders          Ordered    oxyCODONE (ROXICODONE)  5 MG immediate release tablet  Every 8 hours PRN        06/21/24 0259             Note:  This document was  prepared using Conservation officer, historic buildings and may include unintentional dictation errors.   Nikea Settle, Josette SAILOR, DO 06/21/24 [phone]

## 2024-06-21 NOTE — ED Triage Notes (Addendum)
 Right ankle pain that started after he was running on the tread mill yesterday. Pt states he can't put weight on it without pain 7/10. Denies swelling. Reports tenderness. Denies injury.

## 2024-06-21 NOTE — Discharge Instructions (Addendum)
 You may take Tylenol  1000 mg every 6 hours as needed for pain.  You are being provided a prescription for opiates (also known as narcotics) for pain control.  Opiates can be addictive and should only be used when absolutely necessary for pain control when other alternatives do not work.  We recommend you only use them for the recommended amount of time and only as prescribed.  Please do not take with other sedative medications or alcohol.  Please do not drive, operate machinery, make important decisions while taking opiates.  Please note that these medications can be addictive and have high abuse potential.  Patients can become addicted to narcotics after only taking them for a few days.  Please keep these medications locked away from children, teenagers or any family members with history of substance abuse.  Narcotic pain medicine may also make you constipated.  You may use over-the-counter medications such as MiraLAX, Colace to prevent constipation.  If you become constipated, you may use over-the-counter enemas as needed.  Itching and nausea are also common side effects of narcotic pain medication.  If you develop uncontrolled vomiting or a rash, please stop these medications and seek medical care.

## 2024-06-22 ENCOUNTER — Telehealth: Payer: Self-pay

## 2024-06-22 NOTE — Transitions of Care (Post Inpatient/ED Visit) (Signed)
   06/22/2024  Name: Robert Mccann MRN: [id number] DOB: [birth year]  Today's TOC FU Call Status: Today's TOC FU Call Status:: Successful TOC FU Call Completed TOC FU Call Complete Date: 06/23/23 Patient's Name and Date of Birth confirmed.  Transition Care Management Follow-up Telephone Call Date of Discharge: 06/21/24 Discharge Facility: [facility]) Type of Discharge: Emergency Department Reason for ED Visit: Other: (Sprain of unspecified ligament of right ankle) How have you been since you were released from the hospital?: Better Any questions or concerns?: No  Items Reviewed: Did you receive and understand the discharge instructions provided?: Yes Medications obtained,verified, and reconciled?: Yes (Medications Reviewed) Any new allergies since your discharge?: No Dietary orders reviewed?: NA Do you have support at home?: Yes  Medications Reviewed Today: Medications Reviewed Today   Medications were not reviewed in this encounter     Home Care and Equipment/Supplies: Were Home Health Services Ordered?: NA Any new equipment or medical supplies ordered?: No (boot for foot)  Functional Questionnaire: Do you need assistance with bathing/showering or dressing?: No Do you need assistance with meal preparation?: No Do you need assistance with eating?: No Do you have difficulty maintaining continence: No Do you need assistance with getting out of bed/getting out of a chair/moving?: No Do you have difficulty managing or taking your medications?: No  Follow up appointments reviewed: PCP Follow-up appointment confirmed?: Yes Follow-up Provider: pt went to [facility]- has boot an dbrace for knee [facility]ist [facility] Follow-up appointment confirmed?: NA Do you need transportation to your follow-up appointment?: No Do you understand care options if your condition(s) worsen?: Yes-patient verbalized understanding    SIGNATURE Steva Marylene Masek, CMA

## 2024-07-02 ENCOUNTER — Ambulatory Visit: Admitting: Physician Assistant

## 2024-07-07 ENCOUNTER — Ambulatory Visit (INDEPENDENT_AMBULATORY_CARE_PROVIDER_SITE_OTHER): Admitting: Physician Assistant

## 2024-07-07 ENCOUNTER — Other Ambulatory Visit: Payer: Self-pay

## 2024-07-07 ENCOUNTER — Encounter: Payer: Self-pay | Admitting: Physician Assistant

## 2024-07-07 VITALS — BP 126/86 | HR 105 | Temp 97.5°F | Ht 71.0 in | Wt 363.0 lb

## 2024-07-07 DIAGNOSIS — Z6841 Body Mass Index (BMI) 40.0 and over, adult: Secondary | ICD-10-CM

## 2024-07-07 DIAGNOSIS — E1169 Type 2 diabetes mellitus with other specified complication: Secondary | ICD-10-CM | POA: Diagnosis not present

## 2024-07-07 DIAGNOSIS — Z7985 Long-term (current) use of injectable non-insulin antidiabetic drugs: Secondary | ICD-10-CM | POA: Diagnosis not present

## 2024-07-07 DIAGNOSIS — E66813 Obesity, class 3: Secondary | ICD-10-CM | POA: Diagnosis not present

## 2024-07-07 LAB — POCT GLYCOSYLATED HEMOGLOBIN (HGB A1C): Hemoglobin A1C: 7.1 % — AB (ref 4.0–5.6)

## 2024-07-07 MED ORDER — ZEPBOUND 5 MG/0.5ML ~~LOC~~ SOAJ
5.0000 mg | SUBCUTANEOUS | 0 refills | Status: DC
  Filled 2024-07-07 – 2024-07-16 (×2): qty 2, 28d supply, fill #0

## 2024-07-07 NOTE — Assessment & Plan Note (Signed)
 A1c of 7.1% today improved from previous 7.5%, reflecting ongoing glycemic control.  Will be increasing Zepbound  which should help get him to goal of less than 7%.

## 2024-07-07 NOTE — Progress Notes (Signed)
 Date:  07/07/2024   Name:  Robert Mccann   DOB:  08/15/1996   MRN:  [id number]   Chief Complaint: Diabetes  HPI Robert Mccann returns to clinic for 3 month f/u on chronic conditions including DM2, HTN, CHF, and Class 3 obesity. He is joined by his mother Robert Mccann today. After struggling with insurance (would not approve Mounjaro ), we were able to switch him to Zepbound  2.5 mg which he is taking with good compliance and tolerance, ready for dose increase today. Weight seems to be stable.   Unfortunately, he was trying to get back into a good exercise routine but sprained his right ankle just over 2 weeks ago, now with a boot. To complicate things, since he was offloading some of his weight to the left leg, he had a left knee subluxation. He was evaluated by [facility] for both concerns with no dedicated follow up. He seems to be recovering nicely, states he has no pain at present.    Medication list has been reviewed and updated.  Current Meds  Medication Sig   apixaban  (ELIQUIS ) 5 MG TABS tablet Take 1 tablet (5 mg total) by mouth 2 (two) times daily.   Blood Glucose Monitoring Suppl (BLOOD GLUCOSE MONITOR SYSTEM) w/Device KIT Use up to four times daily as directed.   dapagliflozin  propanediol (FARXIGA ) 10 MG TABS tablet Take 1 tablet (10 mg total) by mouth daily.   Fluocinolone  Acetonide Body 0.01 % OIL Apply 1 Application topically 2 (two) times daily as needed. Apply to scalp, face, beard up to 2 times a day as needed for scaling, itching, redness.   furosemide  (LASIX ) 40 MG tablet Take by mouth.   ketoconazole  (NIZORAL ) 2 % shampoo Apply topically 2 (two) times a week.   metFORMIN  (GLUCOPHAGE ) 500 MG tablet Take 1 tablet (500 mg total) by mouth 2 (two) times daily with a meal.   metoprolol  succinate (TOPROL -XL) 100 MG 24 hr tablet Take 1 tablet (100 mg total) by mouth daily.   oxyCODONE  (ROXICODONE ) 5 MG immediate release tablet Take 1 tablet (5 mg total) by mouth every 8 (eight) hours as  needed.   potassium chloride  (KLOR-CON ) 20 MEQ packet Dissolve and take 60 mEq (3 packets) by mouth daily.   sacubitril -valsartan  (ENTRESTO ) 97-103 MG Take 1 tablet by mouth 2 (two) times daily.   sildenafil  (VIAGRA ) 50 MG tablet Take 1 tablet (50 mg total) by mouth daily as needed for erectile dysfunction.   spironolactone  (ALDACTONE ) 25 MG tablet Take 1 tablet (25 mg total) by mouth daily.   torsemide  (DEMADEX ) 20 MG tablet Take 2 tablets (40 mg total) by mouth every other day alternating with 3 tablets (60 mg total) every other day.   ZEPBOUND  5 MG/0.5ML Pen Inject 5 mg into the skin once a week.   [DISCONTINUED] ZEPBOUND  2.5 MG/0.5ML Pen Inject 2.5 mg into the skin once a week.     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) 01/31/2024   ICD (implantable cardioverter-defibrillator) in place 02/25/2023   NICM (nonischemic cardiomyopathy) (HCC)    Chronic HFrEF (heart failure with reduced ejection fraction) (HCC)    Essential hypertension 03/21/2022   History of non-ST elevation myocardial infarction (NSTEMI) 03/13/2022   Hyperlipidemia associated with type 2 diabetes mellitus (HCC) 03/13/2022  History of CVA (cerebrovascular accident) 11/05/2021   History of pulmonary embolus (PE) 06/28/2021   Class 3 severe obesity with serious comorbidity and body mass index (BMI) of 45.0 to 49.9 in adult    Type 2 diabetes mellitus with other specified complication (HCC) 06/27/2021    No Known Allergies  Immunization History  Administered Date(s) Administered   Influenza Inj Mdck Quad Pf 09/25/2020   Influenza,inj,Quad PF,6+ Mos 10/06/2019   Influenza-Unspecified 09/25/2020, 10/02/2022, 08/15/2023   PPD Test 09/23/2020, 01/27/2022    Past Surgical History:   Procedure Laterality Date   COLONOSCOPY N/A 05/18/2022   Procedure: COLONOSCOPY;  Surgeon: Federico Rosario BROCKS, MD;  Location: [facility] ENDOSCOPY;  Service: Gastroenterology;  Laterality: N/A;   HAND SURGERY Left age 73   HAND SURGERY     HEMOSTASIS CLIP PLACEMENT  05/18/2022   Procedure: HEMOSTASIS CLIP PLACEMENT;  Surgeon: Federico Rosario BROCKS, MD;  Location: [facility] ENDOSCOPY;  Service: Gastroenterology;;   ICD IMPLANT N/A 02/25/2023   Procedure: ICD IMPLANT;  Surgeon: Cindie Ole DASEN, MD;  Location: [facility] INVASIVE CV LAB;  Service: Cardiovascular;  Laterality: N/A;   POLYPECTOMY  05/18/2022   Procedure: POLYPECTOMY;  Surgeon: Federico Rosario BROCKS, MD;  Location: [facility] ENDOSCOPY;  Service: Gastroenterology;;   PULMONARY THROMBECTOMY N/A 06/28/2021   Procedure: PULMONARY THROMBECTOMY;  Surgeon: Marea Selinda RAMAN, MD;  Location: [facility] INVASIVE CV LAB;  Service: Cardiovascular;  Laterality: N/A;   TEE WITHOUT CARDIOVERSION N/A 11/08/2021   Procedure: TRANSESOPHAGEAL ECHOCARDIOGRAM (TEE);  Surgeon: Darliss Rogue, MD;  Location: [facility] ORS;  Service: Cardiovascular;  Laterality: N/A;    Social History   Tobacco Use   Smoking status: Never   Smokeless tobacco: Never  Vaping Use   Vaping status: Never Used  Substance Use Topics   Alcohol use: Not Currently   Drug use: Not Currently    Types: Marijuana    Family History  Problem Relation Age of Onset   Stroke Mother    Diabetes Mother    Heart disease Mother    Heart failure Mother    Cerebrovascular Accident Mother    Diabetes Father    Hypertension Father    Heart disease Maternal Uncle    Diabetes Paternal Uncle    Heart disease Maternal Grandmother    Diabetes Maternal Grandfather    Hypertension Paternal Grandmother    Diabetes Paternal Grandfather    Cancer Paternal Grandfather    Cancer Cousin        unknown origin   Cancer Cousin        unknown origing        07/07/2024    1:17 PM 04/13/2024    8:15 AM 03/12/2024    9:52 AM 01/05/2022   10:22 AM   GAD 7 : Generalized Anxiety Score  Nervous, Anxious, on Edge 0 0 0 0  Control/stop worrying 0 0 0 0  Worry too much - different things 0 0 0 0  Trouble relaxing 0 0 0 0  Restless 0 0 0 0  Easily annoyed or irritable 0 0 0 0  Afraid - awful might happen 0 0 0 0  Total GAD 7 Score 0 0 0 0  Anxiety Difficulty Not difficult at all Not difficult at all Not difficult at all Not difficult at all       07/07/2024    1:17 PM 04/13/2024    8:15 AM 03/25/2024    8:52 AM  Depression screen PHQ 2/9  Decreased Interest 0 0 0  Down, Depressed,  Hopeless 0 0 0  PHQ - 2 Score 0 0 0    BP Readings from Last 3 Encounters:  07/07/24 126/86  06/21/24 118/84  06/02/24 133/89    Wt Readings from Last 3 Encounters:  07/07/24 (!) 363 lb (164.7 kg)  06/21/24 (!) 360 lb (163.3 kg)  06/02/24 (!) 372 lb 12.8 oz (169.1 kg)    BP 126/86   Pulse (!) 105   Temp (!) 97.5 F (36.4 C)   Ht 5' 11 (1.803 m)   Wt (!) 363 lb (164.7 kg)   SpO2 97%   BMI 50.63 kg/m   Physical Exam Vitals and nursing note reviewed.  Constitutional:      Appearance: Normal appearance. He is obese.  Cardiovascular:     Rate and Rhythm: Normal rate.  Pulmonary:     Effort: Pulmonary effort is normal.  Abdominal:     General: There is no distension.  Musculoskeletal:        General: Normal range of motion.  Skin:    General: Skin is warm and dry.  Neurological:     Mental Status: He is alert and oriented to person, place, and time.     Gait: Gait is intact.  Psychiatric:        Mood and Affect: Mood and affect normal.     Recent Labs     Component Value Date/Time   NA 138 06/02/2024 1107   K 4.9 06/02/2024 1107   CL 103 06/02/2024 1107   CO2 21 06/02/2024 1107   GLUCOSE 108 (H) 06/02/2024 1107   GLUCOSE 137 (H) 01/27/2024 1127   BUN 11 06/02/2024 1107   CREATININE 1.01 06/02/2024 1107   CALCIUM  9.4 06/02/2024 1107   PROT 7.6 03/13/2024 1135   ALBUMIN 4.3 03/13/2024 1135   AST 34 03/13/2024 1135   ALT  39 03/13/2024 1135   ALKPHOS 125 (H) 03/13/2024 1135   BILITOT 0.3 03/13/2024 1135   GFRNONAA >60 01/27/2024 1127    Lab Results  Component Value Date   WBC 5.7 08/28/2023   HGB 15.4 08/28/2023   HCT 45.0 08/28/2023   MCV 88 08/28/2023   PLT 375 08/28/2023   Lab Results  Component Value Date   HGBA1C 7.1 (A) 07/07/2024   Lab Results  Component Value Date   CHOL 131 03/13/2024   HDL 42 03/13/2024   LDLCALC 67 03/13/2024   TRIG 124 03/13/2024   CHOLHDL 3.1 03/13/2024   Lab Results  Component Value Date   TSH 5.035 (H) 03/17/2022   Lab Results  Component Value Date   HGBA1C 7.1 (A) 07/07/2024   HGBA1C 7.5 (A) 03/12/2024   HGBA1C 7.2 (A) 05/23/2023     Assessment and Plan:  Class 3 severe obesity with serious comorbidity and body mass index (BMI) of 45.0 to 49.9 in adult Assessment & Plan: Doing well with Zepbound , increase to 5 mg/week.  Advised the importance of adequate protein intake on this medication as well as resistance training which will not only improve insulin  sensitivity but also build muscle mass and reduce risk of atrophy.  Advised importance of portion control and slower eating on this medication to minimize GI side effects. Avoid high fat foods as these may cause discomfort. Patient aware of potential for weight regain when eventually stopping the medication. For this reason, continued efforts toward improving lifestyle will be particularly important moving forward.    Orders: -     Zepbound ; Inject 5 mg into the skin once a  week.  Dispense: 2 mL; Refill: 0  Type 2 diabetes mellitus with other specified complication, without long-term current use of insulin  (HCC) Assessment & Plan: A1c of 7.1% today improved from previous 7.5%, reflecting ongoing glycemic control.  Will be increasing Zepbound  which should help get him to goal of less than 7%.  Orders: -     POCT glycosylated hemoglobin (Hb A1C)     Return in about 4 weeks (around 08/04/2024) for  OV f/u Zepbound .    Rolan Hoyle, PA-C, DMSc, Nutritionist [facility] [phone]

## 2024-07-07 NOTE — Assessment & Plan Note (Signed)
 Doing well with Zepbound , increase to 5 mg/week.  Advised the importance of adequate protein intake on this medication as well as resistance training which will not only improve insulin  sensitivity but also build muscle mass and reduce risk of atrophy.  Advised importance of portion control and slower eating on this medication to minimize GI side effects. Avoid high fat foods as these may cause discomfort. Patient aware of potential for weight regain when eventually stopping the medication. For this reason, continued efforts toward improving lifestyle will be particularly important moving forward.

## 2024-07-16 ENCOUNTER — Other Ambulatory Visit (HOSPITAL_COMMUNITY): Payer: Self-pay

## 2024-07-16 ENCOUNTER — Other Ambulatory Visit: Payer: Self-pay

## 2024-07-16 ENCOUNTER — Telehealth: Payer: Self-pay

## 2024-07-16 NOTE — Telephone Encounter (Signed)
 Sent pt a Mychart message to attach his new insurance card. Insurance card for [employer] is not on file.  KP

## 2024-07-16 NOTE — Telephone Encounter (Signed)
 Copied from CRM [id number]. Topic: Clinical - Medication Prior Auth >> Jul 16, 2024  2:01 PM Yolanda T wrote: Reason for CRM: patient called stated his insurance just changed and [employer] is asking for a peer to peer prior auth for the Zepbound . Please f/u with insurance

## 2024-07-17 ENCOUNTER — Telehealth: Payer: Self-pay

## 2024-07-17 NOTE — Progress Notes (Signed)
 Remote ICD transmission.

## 2024-07-17 NOTE — Telephone Encounter (Signed)
 Please complete PA for Zepbound  5MG . Pt has new insurance [employer]).  KP

## 2024-07-17 NOTE — Addendum Note (Signed)
 Addended by: VICCI SELLER A on: 07/17/2024 03:11 PM   Modules accepted: Orders

## 2024-07-20 ENCOUNTER — Other Ambulatory Visit: Payer: Self-pay

## 2024-07-20 ENCOUNTER — Other Ambulatory Visit (HOSPITAL_COMMUNITY): Payer: Self-pay

## 2024-07-20 ENCOUNTER — Telehealth: Payer: Self-pay

## 2024-07-20 ENCOUNTER — Telehealth: Payer: Self-pay | Admitting: Pharmacy Technician

## 2024-07-20 ENCOUNTER — Telehealth: Payer: Self-pay | Admitting: Physician Assistant

## 2024-07-20 ENCOUNTER — Other Ambulatory Visit: Payer: Self-pay | Admitting: Physician Assistant

## 2024-07-20 DIAGNOSIS — I5022 Chronic systolic (congestive) heart failure: Secondary | ICD-10-CM

## 2024-07-20 DIAGNOSIS — Z6841 Body Mass Index (BMI) 40.0 and over, adult: Secondary | ICD-10-CM

## 2024-07-20 DIAGNOSIS — E1169 Type 2 diabetes mellitus with other specified complication: Secondary | ICD-10-CM

## 2024-07-20 MED ORDER — MOUNJARO 2.5 MG/0.5ML ~~LOC~~ SOAJ
2.5000 mg | SUBCUTANEOUS | 0 refills | Status: DC
  Filled 2024-07-20: qty 2, 28d supply, fill #0

## 2024-07-20 NOTE — Telephone Encounter (Signed)
*  Patient had insurance change! Now [employer]/Medicare Part D (see recent messages).   In April, we tried prior auth for Mounjaro  for type 2 DM due to inadequate control with Ozempic  2 mg. He had BCBS and Medicaid at the time and it was denied so we switched to Zepbound  for obesity indication which was approved. His new insurance is not covering Zepbound .  Let's please try again to get Mounjaro  covered, which we hopefully can do with Medicare. He has Type 2 DM with comorbidities of heart failure and class 3 obesity. Ozempic  did not provide adequate control of A1c even at max dose.   Thank you so much!

## 2024-07-20 NOTE — Telephone Encounter (Signed)
 Awesome news, before I could do a test claim I saw this.....    [name] Mounjaro  is already filled and ready for pick up at his pharmacy.  His copay is $4.80.

## 2024-07-20 NOTE — Telephone Encounter (Signed)
 Provider completed call to pt. Pt is aware.  KP

## 2024-07-20 NOTE — Telephone Encounter (Signed)
 Copied from CRM [id number]. Topic: Clinical - Medication Question >> Jul 20, 2024  3:29 PM Tiffany S wrote: Reason for CRM: MOUNJARO  2.5 MG/0.5ML Pen [[id number]]  Patient asking if he should take the medication above or wait for the 5mg  for  zepbound  to be approved please follow up with patient

## 2024-07-20 NOTE — Telephone Encounter (Signed)
 Pharmacy Patient Advocate Encounter  Received notification from [employer] that Prior Authorization for Zepbound  5MG /0.5ML pen-injectors has been DENIED.  Full denial letter will be uploaded to the media tab. See denial reason below.     PA #/Case ID/Reference #: [id number]

## 2024-07-20 NOTE — Telephone Encounter (Signed)
 Denied  KP

## 2024-07-20 NOTE — Telephone Encounter (Signed)
 PA request has been Submitted. New Encounter has been or will be created for follow up. For additional info see Pharmacy Prior Auth telephone encounter from 07/20/24.

## 2024-07-20 NOTE — Telephone Encounter (Signed)
 Will try to create a new prior auth for Mounjaro  instead of Zepbound  (changed insurance).

## 2024-07-20 NOTE — Telephone Encounter (Signed)
 Just spoke with patient. New insurance does not cover Zepbound , but it does cover Mounjaro . Mounjaro  is what we will be using moving forward.

## 2024-07-20 NOTE — Telephone Encounter (Signed)
 Please review.  KP

## 2024-07-20 NOTE — Telephone Encounter (Signed)
 Pharmacy Patient Advocate Encounter   Received notification from Pt Calls Messages that prior authorization for Zepbound  5MG /0.5ML pen-injectors is required/requested.   Insurance verification completed.   The patient is insured through [place] .   Per test claim: PA required; PA submitted to above mentioned insurance via CoverMyMeds Key/confirmation #/EOC [id number] Status is pending

## 2024-07-23 ENCOUNTER — Other Ambulatory Visit: Payer: Self-pay

## 2024-07-23 ENCOUNTER — Other Ambulatory Visit: Payer: Self-pay | Admitting: Cardiology

## 2024-07-23 ENCOUNTER — Other Ambulatory Visit: Payer: Self-pay | Admitting: Internal Medicine

## 2024-07-23 ENCOUNTER — Other Ambulatory Visit (HOSPITAL_COMMUNITY): Payer: Self-pay | Admitting: Internal Medicine

## 2024-07-23 MED ORDER — ROSUVASTATIN CALCIUM 40 MG PO TABS
40.0000 mg | ORAL_TABLET | Freq: Every day | ORAL | 1 refills | Status: DC
  Filled 2024-07-23 – 2024-09-03 (×2): qty 30, 30d supply, fill #0
  Filled 2024-10-29: qty 30, 30d supply, fill #1

## 2024-07-23 MED ORDER — SACUBITRIL-VALSARTAN 97-103 MG PO TABS
1.0000 | ORAL_TABLET | Freq: Two times a day (BID) | ORAL | 1 refills | Status: DC
  Filled 2024-07-23 – 2024-09-03 (×2): qty 60, 30d supply, fill #0
  Filled 2024-10-29: qty 60, 30d supply, fill #1

## 2024-07-23 MED ORDER — METOPROLOL SUCCINATE ER 100 MG PO TB24
100.0000 mg | ORAL_TABLET | Freq: Every day | ORAL | 10 refills | Status: AC
  Filled 2024-07-23: qty 30, 30d supply, fill #0

## 2024-07-23 MED ORDER — DAPAGLIFLOZIN PROPANEDIOL 10 MG PO TABS
10.0000 mg | ORAL_TABLET | Freq: Every day | ORAL | 0 refills | Status: DC
  Filled 2024-07-23: qty 30, 30d supply, fill #0

## 2024-07-23 MED ORDER — SPIRONOLACTONE 25 MG PO TABS
25.0000 mg | ORAL_TABLET | Freq: Every day | ORAL | 1 refills | Status: DC
  Filled 2024-07-23 – 2024-09-03 (×2): qty 30, 30d supply, fill #0
  Filled 2024-10-29: qty 30, 30d supply, fill #1

## 2024-08-03 ENCOUNTER — Other Ambulatory Visit: Payer: Self-pay

## 2024-08-05 ENCOUNTER — Ambulatory Visit: Admitting: Physician Assistant

## 2024-08-07 ENCOUNTER — Telehealth: Payer: Self-pay | Admitting: Internal Medicine

## 2024-08-07 NOTE — Telephone Encounter (Signed)
 Called to confirm/remind patient of their appointment at the [facility] on 08/10/24.   Appointment:   [x] Confirmed  [] Left mess   [] No answer/No voice mail  [] VM Full/unable to leave message  [] Phone not in service  Patient reminded to bring all medications and/or complete list.  Confirmed patient has transportation. Gave directions, instructed to utilize valet parking.

## 2024-08-10 ENCOUNTER — Encounter: Admitting: Internal Medicine

## 2024-08-11 ENCOUNTER — Ambulatory Visit: Attending: Genetic Counselor | Admitting: Genetic Counselor

## 2024-08-26 ENCOUNTER — Ambulatory Visit (INDEPENDENT_AMBULATORY_CARE_PROVIDER_SITE_OTHER): Payer: Medicaid Other

## 2024-08-26 DIAGNOSIS — I428 Other cardiomyopathies: Secondary | ICD-10-CM | POA: Diagnosis not present

## 2024-08-27 LAB — CUP PACEART REMOTE DEVICE CHECK
Battery Remaining Longevity: 138 mo
Battery Remaining Percentage: 100 %
Brady Statistic RA Percent Paced: 0 %
Brady Statistic RV Percent Paced: 0 %
Date Time Interrogation Session: 20250827111700
HighPow Impedance: 73 Ohm
Implantable Lead Connection Status: 753985
Implantable Lead Connection Status: 753985
Implantable Lead Implant Date: 20240227
Implantable Lead Implant Date: 20240227
Implantable Lead Location: 753859
Implantable Lead Location: 753860
Implantable Lead Model: 673
Implantable Lead Model: 7841
Implantable Lead Serial Number: 1407551
Implantable Lead Serial Number: 209112
Implantable Pulse Generator Implant Date: 20240227
Lead Channel Impedance Value: 376 Ohm
Lead Channel Impedance Value: 767 Ohm
Lead Channel Pacing Threshold Amplitude: 0.4 V
Lead Channel Pacing Threshold Amplitude: 1.2 V
Lead Channel Pacing Threshold Pulse Width: 0.4 ms
Lead Channel Pacing Threshold Pulse Width: 0.4 ms
Lead Channel Setting Pacing Amplitude: 2 V
Lead Channel Setting Pacing Amplitude: 2.5 V
Lead Channel Setting Pacing Pulse Width: 0.4 ms
Lead Channel Setting Sensing Sensitivity: 0.5 mV
Pulse Gen Serial Number: 652491
Zone Setting Status: 755011

## 2024-08-28 ENCOUNTER — Ambulatory Visit: Payer: Self-pay | Admitting: Cardiology

## 2024-09-03 ENCOUNTER — Other Ambulatory Visit: Payer: Self-pay | Admitting: Physician Assistant

## 2024-09-03 ENCOUNTER — Other Ambulatory Visit: Payer: Self-pay | Admitting: Cardiology

## 2024-09-03 ENCOUNTER — Other Ambulatory Visit (HOSPITAL_COMMUNITY): Payer: Self-pay | Admitting: Internal Medicine

## 2024-09-03 ENCOUNTER — Other Ambulatory Visit: Payer: Self-pay

## 2024-09-03 DIAGNOSIS — E1169 Type 2 diabetes mellitus with other specified complication: Secondary | ICD-10-CM

## 2024-09-03 MED FILL — Potassium Chloride Powder Packet 20 mEq: ORAL | 30 days supply | Qty: 90 | Fill #1 | Status: CN

## 2024-09-03 NOTE — Telephone Encounter (Signed)
 Requested medications are due for refill today.  yes  Requested medications are on the active medications list.  yes  Last refill. 07/20/2024 2mL 0 rf  Future visit scheduled.   no  Notes to clinic.  Medication not assigned to a protocol . Please review for refill.    Requested Prescriptions  Pending Prescriptions Disp Refills   MOUNJARO  2.5 MG/0.5ML Pen 2 mL 0    Sig: Inject 2.5 mg into the skin once a week.     Off-Protocol Failed - 09/03/2024  4:50 PM      Failed - Medication not assigned to a protocol, review manually.      Passed - Valid encounter within last 12 months    Recent Outpatient Visits           1 month ago Class 3 severe obesity with serious comorbidity and body mass index (BMI) of 45.0 to 49.9 in adult   [facility] Primary Care & Sports Medicine at [facility], Toribio SQUIBB, [place]   4 months ago Class 3 severe obesity due to excess calories with serious comorbidity and body mass index (BMI) of 45.0 to 49.9 in adult   [facility] Primary Care & Sports Medicine at [facility], Toribio SQUIBB, [place]   5 months ago Osteoarthritis of left knee, unspecified osteoarthritis type   [facility] Primary Care & Sports Medicine at [facility], Selinda PARAS, [place]   5 months ago Type 2 diabetes mellitus with other specified complication, without long-term current use of insulin  [facility])   [facility] Primary Care & Sports Medicine at [facility], Toribio SQUIBB, [place]

## 2024-09-03 NOTE — Telephone Encounter (Signed)
 Requested medications are due for refill today.  yes  Requested medications are on the active medications list.  yes  Last refill. 07/20/2024 2mL 0 rf  Future visit scheduled.   no  Notes to clinic.  Medication not assigned to a protocol. Please review for refill.    Requested Prescriptions  Pending Prescriptions Disp Refills   MOUNJARO  2.5 MG/0.5ML Pen 2 mL 0    Sig: Inject 2.5 mg into the skin once a week.     Off-Protocol Failed - 09/03/2024  5:02 PM      Failed - Medication not assigned to a protocol, review manually.      Passed - Valid encounter within last 12 months    Recent Outpatient Visits           1 month ago Class 3 severe obesity with serious comorbidity and body mass index (BMI) of 45.0 to 49.9 in adult   [facility] Primary Care & Sports Medicine at [facility], Toribio SQUIBB, [place]   4 months ago Class 3 severe obesity due to excess calories with serious comorbidity and body mass index (BMI) of 45.0 to 49.9 in adult   [facility] Primary Care & Sports Medicine at [facility], Toribio SQUIBB, [place]   5 months ago Osteoarthritis of left knee, unspecified osteoarthritis type   [facility] Primary Care & Sports Medicine at [facility], Selinda PARAS, [place]   5 months ago Type 2 diabetes mellitus with other specified complication, without long-term current use of insulin  [facility])   [facility] Primary Care & Sports Medicine at [facility], Toribio SQUIBB, [place]

## 2024-09-04 ENCOUNTER — Other Ambulatory Visit: Payer: Self-pay

## 2024-09-04 ENCOUNTER — Other Ambulatory Visit: Payer: Self-pay | Admitting: Physician Assistant

## 2024-09-04 DIAGNOSIS — E1169 Type 2 diabetes mellitus with other specified complication: Secondary | ICD-10-CM

## 2024-09-04 MED ORDER — MOUNJARO 5 MG/0.5ML ~~LOC~~ SOAJ
5.0000 mg | SUBCUTANEOUS | 1 refills | Status: DC
  Filled 2024-09-04 – 2024-09-15 (×3): qty 2, 28d supply, fill #0
  Filled 2024-10-29 (×2): qty 2, 28d supply, fill #1

## 2024-09-04 MED FILL — Ezetimibe Tab 10 MG: ORAL | 90 days supply | Qty: 90 | Fill #0 | Status: AC

## 2024-09-04 MED FILL — Potassium Chloride Powder Packet 20 mEq: ORAL | 30 days supply | Qty: 90 | Fill #1 | Status: AC

## 2024-09-04 MED FILL — Metformin HCl Tab 500 MG: ORAL | 90 days supply | Qty: 180 | Fill #0 | Status: AC

## 2024-09-04 NOTE — Progress Notes (Signed)
 Spoke with patient via phone, he is in agreement with plan for dose increase of Mounjaro  to 5 mg/week.  He was encouraged to use the MyChart app to make an appointment with me in about a month to recheck A1c and assess his progress.

## 2024-09-04 NOTE — Telephone Encounter (Signed)
 Please review.  KP

## 2024-09-05 ENCOUNTER — Other Ambulatory Visit: Payer: Self-pay

## 2024-09-08 ENCOUNTER — Other Ambulatory Visit: Payer: Self-pay

## 2024-09-14 ENCOUNTER — Telehealth: Payer: Self-pay | Admitting: Physician Assistant

## 2024-09-14 ENCOUNTER — Telehealth: Payer: Self-pay | Admitting: Pharmacist

## 2024-09-14 ENCOUNTER — Other Ambulatory Visit (HOSPITAL_COMMUNITY): Payer: Self-pay

## 2024-09-14 NOTE — Progress Notes (Signed)
Remote ICD Transmission.

## 2024-09-14 NOTE — Telephone Encounter (Signed)
 Copied from CRM [id number]. Topic: Clinical - Medication Prior Auth >> Sep 14, 2024  9:48 AM Kevelyn M wrote: Reason for CRM: Patient needs a prior auth for  Mounjaro  to 5 mg.

## 2024-09-14 NOTE — Telephone Encounter (Signed)
 PA for Mounjaro  (Key: [id number] - [id number]) submitted and pending.

## 2024-09-14 NOTE — Telephone Encounter (Signed)
 Please complete PA.    KP

## 2024-09-15 ENCOUNTER — Other Ambulatory Visit (HOSPITAL_COMMUNITY): Payer: Self-pay

## 2024-09-15 ENCOUNTER — Telehealth: Payer: Self-pay | Admitting: Pharmacy Technician

## 2024-09-15 ENCOUNTER — Other Ambulatory Visit: Payer: Self-pay

## 2024-09-15 NOTE — Telephone Encounter (Signed)
 Pharmacy Patient Advocate Encounter   Received notification from Pt Calls Messages that prior authorization for Mounjaro  5 mg/0.5ml pen is required/requested.   Insurance verification completed.   The patient is insured through [place] .   Per test claim: The current 28 day co-pay is, $0.00.  No PA needed at this time. This test claim was processed through [facility]- copay amounts may vary at other pharmacies due to pharmacy/plan contracts, or as the patient moves through the different stages of their insurance plan.

## 2024-09-15 NOTE — Telephone Encounter (Signed)
 Noted  KP

## 2024-09-15 NOTE — Telephone Encounter (Signed)
 Prior authorization approved through 12/30/2024.

## 2024-10-20 ENCOUNTER — Encounter: Admitting: Internal Medicine

## 2024-10-26 ENCOUNTER — Ambulatory Visit: Admitting: Physician Assistant

## 2024-10-29 ENCOUNTER — Other Ambulatory Visit: Payer: Self-pay

## 2024-10-29 ENCOUNTER — Other Ambulatory Visit (HOSPITAL_COMMUNITY): Payer: Self-pay | Admitting: Internal Medicine

## 2024-10-29 MED FILL — Ezetimibe Tab 10 MG: ORAL | 90 days supply | Qty: 90 | Fill #1 | Status: CN

## 2024-10-29 MED FILL — Metformin HCl Tab 500 MG: ORAL | 90 days supply | Qty: 180 | Fill #1 | Status: CN

## 2024-10-30 ENCOUNTER — Other Ambulatory Visit: Payer: Self-pay | Admitting: Internal Medicine

## 2024-10-30 ENCOUNTER — Other Ambulatory Visit: Payer: Self-pay

## 2024-10-30 ENCOUNTER — Telehealth: Payer: Self-pay | Admitting: Internal Medicine

## 2024-10-30 MED FILL — Potassium Chloride Powder Packet 20 mEq: ORAL | 30 days supply | Qty: 90 | Fill #0 | Status: AC

## 2024-10-30 NOTE — Telephone Encounter (Signed)
 Called to confirm/remind patient of their appointment at the [facility] on 11/02/24.   Appointment:   [x] Confirmed  [] Left mess   [] No answer/No voice mail  [] VM Full/unable to leave message  [] Phone not in service  Patient reminded to bring all medications and/or complete list.  Confirmed patient has transportation. Gave directions, instructed to utilize valet parking.

## 2024-11-02 ENCOUNTER — Encounter: Payer: Self-pay | Admitting: Internal Medicine

## 2024-11-02 ENCOUNTER — Ambulatory Visit: Admitting: Physician Assistant

## 2024-11-02 ENCOUNTER — Ambulatory Visit: Attending: Internal Medicine | Admitting: Internal Medicine

## 2024-11-02 ENCOUNTER — Other Ambulatory Visit: Payer: Self-pay

## 2024-11-02 VITALS — BP 154/96 | HR 79 | Wt 369.2 lb

## 2024-11-02 DIAGNOSIS — Z7901 Long term (current) use of anticoagulants: Secondary | ICD-10-CM | POA: Insufficient documentation

## 2024-11-02 DIAGNOSIS — Z86711 Personal history of pulmonary embolism: Secondary | ICD-10-CM | POA: Insufficient documentation

## 2024-11-02 DIAGNOSIS — I110 Hypertensive heart disease with heart failure: Secondary | ICD-10-CM | POA: Diagnosis not present

## 2024-11-02 DIAGNOSIS — I428 Other cardiomyopathies: Secondary | ICD-10-CM | POA: Insufficient documentation

## 2024-11-02 DIAGNOSIS — R0683 Snoring: Secondary | ICD-10-CM | POA: Diagnosis not present

## 2024-11-02 DIAGNOSIS — I2699 Other pulmonary embolism without acute cor pulmonale: Secondary | ICD-10-CM

## 2024-11-02 DIAGNOSIS — Z79899 Other long term (current) drug therapy: Secondary | ICD-10-CM | POA: Insufficient documentation

## 2024-11-02 DIAGNOSIS — E119 Type 2 diabetes mellitus without complications: Secondary | ICD-10-CM | POA: Insufficient documentation

## 2024-11-02 DIAGNOSIS — E66813 Obesity, class 3: Secondary | ICD-10-CM | POA: Diagnosis not present

## 2024-11-02 DIAGNOSIS — I441 Atrioventricular block, second degree: Secondary | ICD-10-CM | POA: Insufficient documentation

## 2024-11-02 DIAGNOSIS — I5082 Biventricular heart failure: Secondary | ICD-10-CM | POA: Diagnosis present

## 2024-11-02 DIAGNOSIS — Z8673 Personal history of transient ischemic attack (TIA), and cerebral infarction without residual deficits: Secondary | ICD-10-CM | POA: Diagnosis not present

## 2024-11-02 DIAGNOSIS — Z95810 Presence of automatic (implantable) cardiac defibrillator: Secondary | ICD-10-CM

## 2024-11-02 DIAGNOSIS — I3139 Other pericardial effusion (noninflammatory): Secondary | ICD-10-CM

## 2024-11-02 DIAGNOSIS — I5022 Chronic systolic (congestive) heart failure: Secondary | ICD-10-CM | POA: Diagnosis not present

## 2024-11-02 DIAGNOSIS — Z7984 Long term (current) use of oral hypoglycemic drugs: Secondary | ICD-10-CM | POA: Diagnosis not present

## 2024-11-02 DIAGNOSIS — Z6841 Body Mass Index (BMI) 40.0 and over, adult: Secondary | ICD-10-CM | POA: Insufficient documentation

## 2024-11-02 DIAGNOSIS — Z7985 Long-term (current) use of injectable non-insulin antidiabetic drugs: Secondary | ICD-10-CM | POA: Insufficient documentation

## 2024-11-02 MED ORDER — TORSEMIDE 20 MG PO TABS
60.0000 mg | ORAL_TABLET | Freq: Every day | ORAL | 3 refills | Status: AC
Start: 2024-11-02 — End: ?
  Filled 2024-11-02: qty 90, 30d supply, fill #0

## 2024-11-02 NOTE — Progress Notes (Signed)
 [facility] Note  Primary Care: Manya Toribio SQUIBB, [place] General Cardiologist: Dr. Darliss HF Cardiologist: Dr. Cherrie  CC: Stage C/D Biventricular failure  HPI: Robert Mccann is a 28 y.o. 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  Admitted 6/22 to [facility] with PE. Underwent thrombectomy/thrombolysis of the right upper/middle/lower lobes. Dopplers negative for DVT. Echo EF <  20% with global hypokinesis and severely reduced RV function. 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    Admitted 11/22 with dysarthria and left hand numbness/weakness. Found to have a large acute posterior right MCA territory infarct. TEE did not show any LA/LAA/LV thrombus, and bubble study was negative.  He reported compliance with Eliquis , which was held in the setting of areas of petechial hemorrhage on follow-up CT.  Unable to complete coronary CTA due to inability to maintain adequate heart rate.   Admitted [facility] 3/23 with CP and SOB. CT chest with small LUL PE, RA clot. Echo EF <20% severe RV dysfunction large LV clot. Transferred to [facility] for further management. No R/LHC with RA and LV clot. cMRI with LVEF 11%, RVEF 19%, large LV thrombus, criteria for LV non-compaction met, subendocardial LGE which can be seen in CAD and noncompaction, moderate pericardial effusion, moderate TR, mild MR. Milrinone  weaned and GDMT titrated, no beta blocker with wenckebach AV block. Not currently candidate for advanced therapies due to size and noncompliance. Discharged home, weight 309 lbs.  Follow up 4/23, Echo showed EF <20% moderate RV dysfunction large LV clot. Entresto  increased and sleep study arranged.  Admitted 5/23 with rectal bleeding. Eliquis  held. GI consulted, underwent colonoscopy showing significant bleeding from anal fissure. Had hemorrhoids,  diverticulosis and 1 polyp that was resected and clipped. Eliquis  resumed before discharge.   Echo 8/23: EF 25-30% RV ok   Echo 12/23 EF 25-30%, LV with GHK, normal RV, LA mildly dilated, no MR   S/p boston scientific dual chamber ICD 02/25/22 by Dr. Cindie.  Echo 4/25 EF 35-40% (I felt 30-35%)  Had Genetics w/u which was + for MHY-7  Here for f/u with his wife. Feels good.Trying to be more active. Bowling once a week. Denies CP, SOB, orthopnea or PND. On Mounjaro .   ICD interrogated: HL score 12 mild fluid overload. No VT/AF. Activity level 0.9-> 2.2 Personally reviewed   Cardiac Studies: - Echo 11/21/23: EF 30% with global hypokinesis.  - Echo 12/23 EF 25-30%, LV with GHK, normal RV, LA mildly dilated, no MR  - Echo 8/23: EF 25-30% RV ok  - Sleep study 8/23 AHI 3.4  - Echo (4/23): EF < 20%, moderate RV dysfunction with large LV clot. - cMRI (3/23): with LVEF 11%, RVEF 19%, large LV thrombus, criteria for LV non-compaction met, subendocardial LGE which can be seen in CAD and noncompaction, moderate pericardial effusion, moderate TR, mild MR.  - Echo (3/23): EF <20% severe RV dysfunction large LV clot. - TEE (11/22): EF 20-25%, global HK LV, severe RV dysfunction, no clot, bubble study negative. - Echo (6/22): EF < 20% with global hypokinesis and severely reduced RV function.   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 05/2021 noted during periods of sleep @ time of hospitalization for PE.   Cardiomyopathy (HCC)    a. 05/2021 Echo: EF <20%, glob HK. [place]:C ratio of 2.1:1 suggesting non-compaction. Gr2 DD. Sev reduced  RV fxn. Nl PASP. Mildly dil RA. Mild MR; b. 10/2021 Echo: EF <20%, GrII DD; c. 10/2021 TEE: EF 20-25%, glob HK, sev red RV fxn.   Diabetes mellitus without complication (HCC)    Erythrocytosis 05/30/2022   HFrEF (heart failure with reduced ejection fraction) (HCC)    a. 05/2021 Echo: EF <20%. ? non-compaction.   Hyperlipidemia    Hypertension     LV (left ventricular) mural thrombus    Pulmonary embolism (HCC)    a. 05/2021 CTA Chest: PE extending from lobar arteris of RUL and RLL w/ concern for pulml infarct-->s/p thrombolysis and thrombectomy of RUL/RML/RLL.   Stroke [facility])    a. 10/2021 CT Head: Bilat cerebral infarcts w/ mod-sided acute to subacute R temporoparietal infarct; b. 10/2021 MRI: R post PCA territory infarcts w/o hemorrhage; c. 10/2021 TEE: Neg bubble study. No LA/LAA thrombus. EF 20-25%, glob HK.   Current Outpatient Medications  Medication Sig Dispense Refill   apixaban  (ELIQUIS ) 5 MG TABS tablet Take 1 tablet (5 mg total) by mouth 2 (two) times daily. 180 tablet 3   Blood Glucose Monitoring Suppl (BLOOD GLUCOSE MONITOR SYSTEM) w/Device KIT Use up to four times daily as directed. 1 kit 3   dapagliflozin  propanediol (FARXIGA ) 10 MG TABS tablet Take 1 tablet (10 mg total) by mouth daily. 30 tablet 0   ezetimibe  (ZETIA ) 10 MG tablet Take 1 tablet (10 mg total) by mouth daily. 90 tablet 2   Fluocinolone  Acetonide Body 0.01 % OIL Apply 1 Application topically 2 (two) times daily as needed. Apply to scalp, face, beard up to 2 times a day as needed for scaling, itching, redness. 118 mL 3   ketoconazole  (NIZORAL ) 2 % shampoo Apply topically 2 (two) times a week. 120 mL 3   metFORMIN  (GLUCOPHAGE ) 500 MG tablet Take 1 tablet (500 mg total) by mouth 2 (two) times daily with a meal. 180 tablet 1   metoprolol  succinate (TOPROL -XL) 100 MG 24 hr tablet Take 1 tablet (100 mg total) by mouth daily. 30 tablet 10   MOUNJARO  5 MG/0.5ML Pen Inject 5 mg into the skin once a week. 2 mL 1   potassium chloride  (KLOR-CON ) 20 MEQ packet Dissolve and take 60 mEq (3 packets) by mouth daily. 90 packet 1   rosuvastatin  (CRESTOR ) 40 MG tablet Take 1 tablet (40 mg total) by mouth daily. 30 tablet 1   sacubitril -valsartan  (ENTRESTO ) 97-103 MG Take 1 tablet by mouth 2 (two) times daily. 60 tablet 1   spironolactone  (ALDACTONE ) 25 MG tablet Take 1 tablet (25  mg total) by mouth daily. 30 tablet 1   torsemide  (DEMADEX ) 20 MG tablet Take 2 tablets (40 mg total) by mouth every other day alternating with 3 tablets (60 mg total) every other day. 140 tablet 1   furosemide  (LASIX ) 40 MG tablet Take 40 mg by mouth daily.     oxyCODONE  (ROXICODONE ) 5 MG immediate release tablet Take 1 tablet (5 mg total) by mouth every 8 (eight) hours as needed. (Patient not taking: Reported on 11/02/2024) 12 tablet 0   sildenafil  (VIAGRA ) 50 MG tablet Take 1 tablet (50 mg total) by mouth daily as needed for erectile dysfunction. (Patient not taking: Reported on 11/02/2024) 10 tablet 0   No current facility-administered medications for this visit.   No Known Allergies  Social History   Socioeconomic History   Marital status: Single    Spouse name: Not on file   Number of children: 0   Years of education: Not  on file   Highest education level: Not on file  Occupational History   Not on file  Tobacco Use   Smoking status: Never   Smokeless tobacco: Never  Vaping Use   Vaping status: Never Used  Substance and Sexual Activity   Alcohol use: Not Currently   Drug use: Not Currently    Types: Marijuana   Sexual activity: Yes    Partners: Female  Other Topics Concern   Not on file  Social History Narrative   Smokes weed; No smoking cigarettes; no alcohol. 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 Lives in [place].  NO children.    Social Drivers of Corporate Investment Banker Strain: Not on file  Food Insecurity: No Food Insecurity (03/12/2024)   Hunger Vital Sign    Worried About Running Out of Food in the Last Year: Never true    Ran Out of Food in the Last Year: Never true  Transportation Needs: No Transportation Needs (03/12/2024)   PRAPARE - Administrator, Civil Service (Medical): No    Lack of Transportation (Non-Medical): No  Physical Activity: Not on file  Stress: Not on file  Social Connections: Not on file  Intimate Partner  Violence: Not At Risk (03/12/2024)   Humiliation, Afraid, Rape, and Kick questionnaire    Fear of Current or Ex-Partner: No    Emotionally Abused: No    Physically Abused: No    Sexually Abused: No   Family History  Problem Relation Age of Onset   Stroke Mother    Diabetes Mother    Heart disease Mother    Heart failure Mother    Cerebrovascular Accident Mother    Diabetes Father    Hypertension Father    Heart disease Maternal Uncle    Diabetes Paternal Uncle    Heart disease Maternal Grandmother    Diabetes Maternal Grandfather    Hypertension Paternal Grandmother    Diabetes Paternal Grandfather    Cancer Paternal Grandfather    Cancer Cousin        unknown origin   Cancer Cousin        unknown origing   BP (!) 154/96 (BP Location: Left Arm, Patient Position: Sitting, Cuff Size: Large)   Pulse 79   Wt (!) 369 lb 4 oz (167.5 kg)   SpO2 98%   BMI 51.50 kg/m   Wt Readings from Last 3 Encounters:  11/02/24 (!) 369 lb 4 oz (167.5 kg)  07/07/24 (!) 363 lb (164.7 kg)  06/21/24 (!) 360 lb (163.3 kg)   PHYSICAL EXAM: Vitals:   11/02/24 1606  BP: (!) 154/96  Pulse: 79  SpO2: 98%    General:  Sitting in chair. No resp difficulty HEENT: normal Neck: supple. no JVD.  Cor: Regular rate & rhythm. No rubs, gallops or murmurs. Lungs: clear Abdomen: obese soft, nontender, nondistended.Good bowel sounds. Extremities: no cyanosis, clubbing, rash, edema Neuro: alert & orientedx3, cranial nerves grossly intact. moves all 4 extremities w/o difficulty. Affect pleasant   Device interrogation: As above  ASSESSMENT & PLAN: 1. 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 No CAD  CAC = 0. - cMRI (3/23): LVEF 11%, RVEF 19%, large LV thrombus, criteria for LV non-compaction met, subendocardial LGE which can be seen in CAD and noncompaction, moderate pericardial effusion, moderate TR,  mild MR - Echo (4/23) EF <20% moderate RV dysfunction large  LV clot. - Echo 8/23: EF 25-30% RV ok  - Echo 12/23 EF 25-30%, LV with GHK, normal RV, LA mildly dilated, no MR  - Echo 11/21/23: EF 25%-30% - Echo 5/25 EF 35-40%  - He has MYH7 genetic cardiomyopathy. (Mother has it too) - S/p boston scientific dual chamber ICD 02/25/22 by Dr. Cindie. - ICD interrogated: HL score 12 mild fluid overload. No VT/AF. Activity level 0.9-> 2.2 Personally reviewed - Doing well NYHA I - Volume up. Increase torsemide  to 60 daily -> check labs in 2 weeks - Continue Entresto  97/103 mg bid. - Continue spironolactone  25 mg daily. - Continue Farxiga  10 mg daily. - Continue toprol  100mg  daily - GDMT optimized  2. 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 - Large LV thrombus on echo and cMRI.in 4/23 - Continue apixaban  life long while EF down - Resolved on recent echo  3. Pericardial effusion - Moderate in size on cMRI. - Trivial on echo 4/23. - Resolved   4. History of PE s/p thrombectomy and thrombolysis with recurrent new PEs most recent admission - Continue Eliquis     5. History of right posterior PCA territory infarcts (cardioembolic) - At high risk for recurrent events with large LV thrombus.and LVNC - Continue Eliquis  and statin.  6. DM2 - Per PCP - Last A1 7.5 - Continue SGLT2i and GLP1RA.   7. Morbid obesity - Body mass index is 51.5 kg/m. - On Mounjaro  but gaining weight. Asked him to discuss dose increase with PCP  8. Snoring - sleep study 8/23 AHI 3.4  9. HTN - BP up here today but didn't take meds this am - discussed need for compliance and will follow BP  Toribio Fuel, MD  4:29 PM

## 2024-11-02 NOTE — Patient Instructions (Signed)
 Medication Changes:  INCREASE Torsemide  to 60 mg (3 tabs) Daily  Lab Work:  Your provider would like for you to return in 2 weeks to have repeat lab work.  Please [facility] to [facility]  You do not need an appointment.    Special Instructions // Education:  Do the following things EVERYDAY: Weigh yourself in the morning before breakfast. Write it down and keep it in a log. Take your medicines as prescribed Eat low salt foods--Limit salt (sodium) to 2000 mg per day.  Stay as active as you can everyday Limit all fluids for the day to less than 2 liters   Follow-Up in: 4 months (March 2026), *WE WILL CALL YOU CLOSER TO THIS TIME TO SCHEDULE    If you have any questions or concerns before your next appointment please send us  a message through [place] or call our office at [phone], If it is after office hours your call will be answered by our answering service and directed appropriately.     At the [facility], you and your health needs are our priority. We have a designated team specialized in the treatment of Heart Failure. This Care Team includes your primary Heart Failure Specialized Cardiologist (physician), Advanced Practice Providers (APPs- Physician Assistants and Nurse Practitioners), and Pharmacist who all work together to provide you with the care you need, when you need it.   You may see any of the following providers on your designated Care Team at your next follow up:  Dr. Toribio Fuel Dr. Ezra Shuck Dr. Ria Commander Dr. Odis Brownie Greig Mosses, NP Caffie Shed, [place] [address] [place], [place] Beckey Coe, NP Jordan Lee, NP Ellouise Class, NP Jaun Bash, PharmD

## 2024-11-03 ENCOUNTER — Encounter: Payer: Self-pay | Admitting: Physician Assistant

## 2024-11-03 ENCOUNTER — Other Ambulatory Visit: Payer: Self-pay

## 2024-11-03 ENCOUNTER — Ambulatory Visit (INDEPENDENT_AMBULATORY_CARE_PROVIDER_SITE_OTHER): Admitting: Physician Assistant

## 2024-11-03 VITALS — BP 130/88 | HR 79 | Temp 98.1°F | Ht 71.0 in | Wt 361.0 lb

## 2024-11-03 DIAGNOSIS — H35361 Drusen (degenerative) of macula, right eye: Secondary | ICD-10-CM | POA: Insufficient documentation

## 2024-11-03 DIAGNOSIS — E66813 Obesity, class 3: Secondary | ICD-10-CM

## 2024-11-03 DIAGNOSIS — Z23 Encounter for immunization: Secondary | ICD-10-CM | POA: Diagnosis not present

## 2024-11-03 DIAGNOSIS — E1141 Type 2 diabetes mellitus with diabetic mononeuropathy: Secondary | ICD-10-CM | POA: Diagnosis not present

## 2024-11-03 DIAGNOSIS — Z7984 Long term (current) use of oral hypoglycemic drugs: Secondary | ICD-10-CM

## 2024-11-03 DIAGNOSIS — L608 Other nail disorders: Secondary | ICD-10-CM | POA: Diagnosis not present

## 2024-11-03 DIAGNOSIS — Z6841 Body Mass Index (BMI) 40.0 and over, adult: Secondary | ICD-10-CM

## 2024-11-03 LAB — POCT GLYCOSYLATED HEMOGLOBIN (HGB A1C): Hemoglobin A1C: 7.2 % — AB (ref 4.0–5.6)

## 2024-11-03 MED ORDER — MOUNJARO 7.5 MG/0.5ML ~~LOC~~ SOAJ
7.5000 mg | SUBCUTANEOUS | 0 refills | Status: DC
  Filled 2024-11-03: qty 2, 28d supply, fill #0

## 2024-11-03 NOTE — Assessment & Plan Note (Signed)
 A1c of 7.2% today stable but could be better.,   Will be increasing Mounjaro  to 7.5 mg weekly which should help get him to goal of less than 7%.  Plan to titrate up to 10 mg weekly next visit as long as it is well-tolerated.  Advised the importance of adequate protein intake on this medication as well as resistance training which will not only improve insulin  sensitivity but also build muscle mass and reduce risk of atrophy.  I recommended minimum 20 min sessions 3x/wk. Advised importance of portion control and slower eating on this medication to minimize GI side effects. Avoid high fat foods as these may cause discomfort. Patient aware of potential for weight regain when eventually stopping the medication. For this reason, continued efforts toward improving lifestyle will be particularly important moving forward.

## 2024-11-03 NOTE — Progress Notes (Signed)
 Date:  11/03/2024   Name:  Robert Mccann   DOB:  17-Apr-1996   MRN:  [id number]   Chief Complaint: Medication Refill (Patient presents today for a refill on his Mounjaro  and go up on dose.)  HPI  Zi presents for 57-month follow-up on diabetes, due for A1c, presently on Mounjaro  5 mg weekly.  Cardiology has suggested dose increase to Mounjaro .  Patient reports prioritizing protein in his diet but does not consume many whole foods or vegetables.  Does not get much fiber either.  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  Seems that his exercise tolerance has improved per his recent cardiology visit.  Torsemide  has been increased to 60 mg once daily  Had eye exam this year at [facility].  Medication list has been reviewed and updated.  Current Meds  Medication Sig   apixaban  (ELIQUIS ) 5 MG TABS tablet Take 1 tablet (5 mg total) by mouth 2 (two) times daily.   Blood Glucose Monitoring Suppl (BLOOD GLUCOSE MONITOR SYSTEM) w/Device KIT Use up to four times daily as directed.   dapagliflozin  propanediol (FARXIGA ) 10 MG TABS tablet Take 1 tablet (10 mg total) by mouth daily.   ezetimibe  (ZETIA ) 10 MG tablet Take 1 tablet (10 mg total) by mouth daily.   Fluocinolone  Acetonide Body 0.01 % OIL Apply 1 Application topically 2 (two) times daily as needed. Apply to scalp, face, beard up to 2 times a day as needed for scaling, itching, redness.   ketoconazole  (NIZORAL ) 2 % shampoo Apply topically 2 (two) times a week.   metFORMIN  (GLUCOPHAGE ) 500 MG tablet Take 1 tablet (500 mg total) by mouth 2 (two) times daily with a meal.   metoprolol  succinate (TOPROL -XL) 100 MG 24 hr tablet Take 1 tablet (100 mg total) by mouth daily.   MOUNJARO  7.5 MG/0.5ML Pen Inject 7.5 mg into the skin once a week.   potassium chloride  (KLOR-CON ) 20 MEQ packet Dissolve and take 60 mEq (3 packets) by mouth daily.   rosuvastatin  (CRESTOR ) 40 MG tablet Take 1 tablet (40 mg total) by mouth daily.    sacubitril -valsartan  (ENTRESTO ) 97-103 MG Take 1 tablet by mouth 2 (two) times daily.   sildenafil  (VIAGRA ) 50 MG tablet Take 1 tablet (50 mg total) by mouth daily as needed for erectile dysfunction.   spironolactone  (ALDACTONE ) 25 MG tablet Take 1 tablet (25 mg total) by mouth daily.   torsemide  (DEMADEX ) 20 MG tablet Take 3 tablets (60 mg total) by mouth daily.   [DISCONTINUED] MOUNJARO  5 MG/0.5ML Pen Inject 5 mg into the skin once a week.     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) 01/31/2024   ICD (implantable cardioverter-defibrillator) in place 02/25/2023   NICM (nonischemic cardiomyopathy) (HCC)    Chronic HFrEF (heart failure with reduced ejection fraction) (HCC)    Essential hypertension 03/21/2022   History of non-ST elevation myocardial infarction (NSTEMI) 03/13/2022   Hyperlipidemia associated with type 2 diabetes mellitus (HCC) 03/13/2022   History of CVA (cerebrovascular accident) 11/05/2021   History of pulmonary embolus (PE) 06/28/2021   Class 3 severe obesity with serious comorbidity and body mass index (BMI) of 45.0 to 49.9 in adult [facility])    Type 2  diabetes mellitus with diabetic mononeuropathy, without long-term current use of insulin  (HCC) 06/27/2021    No Known Allergies  Immunization History  Administered Date(s) Administered   Influenza Inj Mdck Quad Pf 09/25/2020   Influenza, Seasonal, Injecte, Preservative Fre 11/03/2024   Influenza,inj,Quad PF,6+ Mos 10/06/2019   Influenza-Unspecified 09/25/2020, 10/02/2022, 08/15/2023   PPD Test 09/23/2020, 01/27/2022   Tdap 11/03/2024    Past Surgical History:  Procedure Laterality Date   COLONOSCOPY N/A 05/18/2022   Procedure: COLONOSCOPY;   Surgeon: Federico Rosario BROCKS, MD;  Location: [facility] ENDOSCOPY;  Service: Gastroenterology;  Laterality: N/A;   HAND SURGERY Left age 40   HAND SURGERY     HEMOSTASIS CLIP PLACEMENT  05/18/2022   Procedure: HEMOSTASIS CLIP PLACEMENT;  Surgeon: Federico Rosario BROCKS, MD;  Location: [facility] ENDOSCOPY;  Service: Gastroenterology;;   ICD IMPLANT N/A 02/25/2023   Procedure: ICD IMPLANT;  Surgeon: Cindie Ole DASEN, MD;  Location: [facility] INVASIVE CV LAB;  Service: Cardiovascular;  Laterality: N/A;   POLYPECTOMY  05/18/2022   Procedure: POLYPECTOMY;  Surgeon: Federico Rosario BROCKS, MD;  Location: [facility] ENDOSCOPY;  Service: Gastroenterology;;   PULMONARY THROMBECTOMY N/A 06/28/2021   Procedure: PULMONARY THROMBECTOMY;  Surgeon: Marea Selinda RAMAN, MD;  Location: [facility] INVASIVE CV LAB;  Service: Cardiovascular;  Laterality: N/A;   TEE WITHOUT CARDIOVERSION N/A 11/08/2021   Procedure: TRANSESOPHAGEAL ECHOCARDIOGRAM (TEE);  Surgeon: Darliss Rogue, MD;  Location: [facility] ORS;  Service: Cardiovascular;  Laterality: N/A;    Social History   Tobacco Use   Smoking status: Never   Smokeless tobacco: Never  Vaping Use   Vaping status: Never Used  Substance Use Topics   Alcohol use: Not Currently   Drug use: Not Currently    Types: Marijuana    Family History  Problem Relation Age of Onset   Stroke Mother    Diabetes Mother    Heart disease Mother    Heart failure Mother    Cerebrovascular Accident Mother    Diabetes Father    Hypertension Father    Heart disease Maternal Uncle    Diabetes Paternal Uncle    Heart disease Maternal Grandmother    Diabetes Maternal Grandfather    Hypertension Paternal Grandmother    Diabetes Paternal Grandfather    Cancer Paternal Grandfather    Cancer Cousin        unknown origin   Cancer Cousin        unknown origing        11/03/2024    9:19 AM 07/07/2024    1:17 PM 04/13/2024    8:15 AM 03/12/2024    9:52 AM  GAD 7 : Generalized Anxiety Score  Nervous, Anxious, on Edge 0 0 0 0  Control/stop  worrying 0 0 0 0  Worry too much - different things 0 0 0 0  Trouble relaxing 0 0 0 0  Restless 0 0 0 0  Easily annoyed or irritable 0 0 0 0  Afraid - awful might happen 0 0 0 0  Total GAD 7 Score 0 0 0 0  Anxiety Difficulty Not difficult at all Not difficult at all Not difficult at all Not difficult at all       11/03/2024    9:18 AM 07/07/2024    1:17 PM 04/13/2024    8:15 AM  Depression screen PHQ 2/9  Decreased Interest 0 0 0  Down, Depressed, Hopeless 0 0 0  PHQ - 2 Score 0 0 0  Altered sleeping 0    Tired, decreased  energy 0    Change in appetite 0    Feeling bad or failure about yourself  0    Trouble concentrating 0    Moving slowly or fidgety/restless 0    Suicidal thoughts 0    PHQ-9 Score 0    Difficult doing work/chores Not difficult at all      BP Readings from Last 3 Encounters:  11/03/24 130/88  11/02/24 (!) 154/96  07/07/24 126/86    Wt Readings from Last 3 Encounters:  11/03/24 (!) 361 lb (163.7 kg)  11/02/24 (!) 369 lb 4 oz (167.5 kg)  07/07/24 (!) 363 lb (164.7 kg)    BP 130/88   Pulse 79   Temp 98.1 F (36.7 C) (Oral)   Ht 5' 11 (1.803 m)   Wt (!) 361 lb (163.7 kg)   SpO2 95%   BMI 50.35 kg/m   Physical Exam Vitals and nursing note reviewed.  Constitutional:      Appearance: Normal appearance. He is obese.  Cardiovascular:     Rate and Rhythm: Normal rate.  Pulmonary:     Effort: Pulmonary effort is normal.  Abdominal:     General: There is no distension.  Musculoskeletal:        General: Normal range of motion.  Skin:    General: Skin is warm and dry.  Neurological:     Mental Status: He is alert and oriented to person, place, and time.     Gait: Gait is intact.  Psychiatric:        Mood and Affect: Mood and affect normal.     Diabetic Foot Exam - Simple   Simple Foot Form Diabetic Foot exam was performed with the following findings: Yes 11/03/2024  9:45 AM  Visual Inspection No deformities, no ulcerations, no other skin  breakdown bilaterally: Yes Sensation Testing See comments: Yes Pulse Check Posterior Tibialis and Dorsalis pulse intact bilaterally: Yes Comments Onychomycosis of left 5th toenail. Dark vertical line of left great toenail, stable per patient with a similar but fainter line on the right great toenail. Decreased sensation to light touch on left foot.      Recent Labs     Component Value Date/Time   NA 138 06/02/2024 1107   K 4.9 06/02/2024 1107   CL 103 06/02/2024 1107   CO2 21 06/02/2024 1107   GLUCOSE 108 (H) 06/02/2024 1107   GLUCOSE 137 (H) 01/27/2024 1127   BUN 11 06/02/2024 1107   CREATININE 1.01 06/02/2024 1107   CALCIUM  9.4 06/02/2024 1107   PROT 7.6 03/13/2024 1135   ALBUMIN 4.3 03/13/2024 1135   AST 34 03/13/2024 1135   ALT 39 03/13/2024 1135   ALKPHOS 125 (H) 03/13/2024 1135   BILITOT 0.3 03/13/2024 1135   GFRNONAA >60 01/27/2024 1127    Lab Results  Component Value Date   WBC 5.7 08/28/2023   HGB 15.4 08/28/2023   HCT 45.0 08/28/2023   MCV 88 08/28/2023   PLT 375 08/28/2023   Lab Results  Component Value Date   HGBA1C 7.2 (A) 11/03/2024   HGBA1C 7.1 (A) 07/07/2024   HGBA1C 7.5 (A) 03/12/2024   Lab Results  Component Value Date   CHOL 131 03/13/2024   HDL 42 03/13/2024   LDLCALC 67 03/13/2024   TRIG 124 03/13/2024   CHOLHDL 3.1 03/13/2024   Lab Results  Component Value Date   TSH 5.035 (H) 03/17/2022      Assessment and Plan:  Type 2 diabetes mellitus with diabetic mononeuropathy,  without long-term current use of insulin  (HCC) Assessment & Plan: A1c of 7.2% today stable but could be better.,   Will be increasing Mounjaro  to 7.5 mg weekly which should help get him to goal of less than 7%.  Plan to titrate up to 10 mg weekly next visit as long as it is well-tolerated.  Advised the importance of adequate protein intake on this medication as well as resistance training which will not only improve insulin  sensitivity but also build muscle mass and  reduce risk of atrophy.  I recommended minimum 20 min sessions 3x/wk. Advised importance of portion control and slower eating on this medication to minimize GI side effects. Avoid high fat foods as these may cause discomfort. Patient aware of potential for weight regain when eventually stopping the medication. For this reason, continued efforts toward improving lifestyle will be particularly important moving forward.   Orders: -     POCT glycosylated hemoglobin (Hb A1C) -     Mounjaro ; Inject 7.5 mg into the skin once a week.  Dispense: 2 mL; Refill: 0  Class 3 severe obesity due to excess calories with serious comorbidity and body mass index (BMI) of 45.0 to 49.9 in adult [facility]) Assessment & Plan: Increase Mounjaro  as described above.  Encouraged continued physical activity.  Opportunities for improvement in the diet.  I challenged him to consume at least 1 whole fruit and 1 whole vegetable every day.   Longitudinal melanonychia Assessment & Plan: Discussed with patient I am not particularly concerned with this problem, but will be referring to dermatology for second opinion and additional reassurance.  Patient says the hyperpigmentation has been there for at least 5 years.   Immunization due -     Tdap vaccine greater than or equal to 7yo IM  Encounter for immunization -     Flu vaccine trivalent PF, 6mos and older(Flulaval,Afluria,Fluarix,Fluzone)     Return in about 4 weeks (around 12/01/2024) for OV f/u Mounjaro .    Rolan Hoyle, PA-C, DMSc, Nutritionist [facility] [phone]

## 2024-11-03 NOTE — Assessment & Plan Note (Signed)
 Increase Mounjaro  as described above.  Encouraged continued physical activity.  Opportunities for improvement in the diet.  I challenged him to consume at least 1 whole fruit and 1 whole vegetable every day.

## 2024-11-03 NOTE — Assessment & Plan Note (Signed)
 Discussed with patient I am not particularly concerned with this problem, but will be referring to dermatology for second opinion and additional reassurance.  Patient says the hyperpigmentation has been there for at least 5 years.

## 2024-11-17 ENCOUNTER — Other Ambulatory Visit: Payer: Self-pay

## 2024-11-17 ENCOUNTER — Ambulatory Visit: Admitting: Dermatology

## 2024-11-17 ENCOUNTER — Encounter: Payer: Self-pay | Admitting: Dermatology

## 2024-11-17 DIAGNOSIS — S90121A Contusion of right lesser toe(s) without damage to nail, initial encounter: Secondary | ICD-10-CM | POA: Diagnosis not present

## 2024-11-17 DIAGNOSIS — L608 Other nail disorders: Secondary | ICD-10-CM | POA: Diagnosis not present

## 2024-11-17 DIAGNOSIS — L219 Seborrheic dermatitis, unspecified: Secondary | ICD-10-CM

## 2024-11-17 DIAGNOSIS — R233 Spontaneous ecchymoses: Secondary | ICD-10-CM

## 2024-11-17 MED ORDER — KETOCONAZOLE 2 % EX SHAM
1.0000 | MEDICATED_SHAMPOO | Freq: Once | CUTANEOUS | 11 refills | Status: AC
  Filled 2024-11-17 – 2024-12-02 (×2): qty 120, 24d supply, fill #0

## 2024-11-17 MED ORDER — KETOCONAZOLE 2 % EX CREA
1.0000 | TOPICAL_CREAM | Freq: Two times a day (BID) | CUTANEOUS | 11 refills | Status: AC
  Filled 2024-11-17: qty 15, 30d supply, fill #0
  Filled 2024-12-02 (×2): qty 60, 30d supply, fill #0

## 2024-11-17 NOTE — Patient Instructions (Signed)

## 2024-11-17 NOTE — Progress Notes (Signed)
   New Patient Visit   Subjective  Robert Mccann is a 28 y.o. male who presents for the following: patient referred for dark lines at big toenail b/l. Patient thinks they have been like that for years with no change.  The following portions of the chart were reviewed this encounter and updated as appropriate: medications, allergies, medical history  Review of Systems:  No other skin or systemic complaints except as noted in HPI or Assessment and Plan.  Objective  Well appearing patient in no apparent distress; mood and affect are within normal limits.   A focused examination was performed of the following areas: Feet, toenails, arms, hands  Relevant exam findings are noted in the Assessment and Plan.    Assessment & Plan   LONGITUDINAL MELANONYCHIA Exam: 2 mm pigmentation at left first toenail. No involvement of nail folds. Faint 1-2 mm pigmentation R first toenail  Treatment Plan: Benign, monitor for change.  Patient advised if any change, area gets wider or darker or extends to skin he should RTC.   Robert Mccann Exam: hemorrhagic crusting and subungual hemorrhage at R 3rd toenail   Treatment Plan: Benign, observe.  Secondary to trauma per patient  SEBORRHEIC DERMATITIS Exam: scale at scalp  flared  Seborrheic Dermatitis is a chronic persistent rash characterized by pinkness and scaling most commonly of the mid face but also can occur on the scalp (dandruff), ears; mid chest, mid back and groin.  It tends to be exacerbated by stress and Mccann weather.  People who have neurologic disease may experience new onset or exacerbation of existing seborrheic dermatitis.  The condition is not curable but treatable and can be controlled.  Treatment Plan: Apply ketoconazole  cream once to twice a day as needed Apply ketoconazole  shampoo apply three times per week, massage into scalp and leave in for 10 minutes before rinsing out. This can be followed by conditioner or shampoo and  conditioner of your choice.   LONGITUDINAL MELANONYCHIA   [name]   SEBORRHEIC DERMATITIS    Return in about 1 year (around 11/17/2025) for Robert Mccann, with Dr. Claudene, recheck nails.  Robert Mccann, RMA, am acting as scribe for Boneta Claudene, MD .   Documentation: I have reviewed the above documentation for accuracy and completeness, and I agree with the above.  Boneta Claudene, MD

## 2024-11-25 ENCOUNTER — Ambulatory Visit: Payer: Medicaid Other

## 2024-11-25 DIAGNOSIS — I428 Other cardiomyopathies: Secondary | ICD-10-CM | POA: Diagnosis not present

## 2024-11-27 LAB — CUP PACEART REMOTE DEVICE CHECK
Battery Remaining Longevity: 138 mo
Battery Remaining Percentage: 100 %
Brady Statistic RA Percent Paced: 0 %
Brady Statistic RV Percent Paced: 0 %
Date Time Interrogation Session: 20251126025100
HighPow Impedance: 71 Ohm
Implantable Lead Connection Status: 753985
Implantable Lead Connection Status: 753985
Implantable Lead Implant Date: 20240227
Implantable Lead Implant Date: 20240227
Implantable Lead Location: 753859
Implantable Lead Location: 753860
Implantable Lead Model: 673
Implantable Lead Model: 7841
Implantable Lead Serial Number: 1407551
Implantable Lead Serial Number: 209112
Implantable Pulse Generator Implant Date: 20240227
Lead Channel Impedance Value: 369 Ohm
Lead Channel Impedance Value: 801 Ohm
Lead Channel Pacing Threshold Amplitude: 0.4 V
Lead Channel Pacing Threshold Amplitude: 1.2 V
Lead Channel Pacing Threshold Pulse Width: 0.4 ms
Lead Channel Pacing Threshold Pulse Width: 0.4 ms
Lead Channel Setting Pacing Amplitude: 2 V
Lead Channel Setting Pacing Amplitude: 2.5 V
Lead Channel Setting Pacing Pulse Width: 0.4 ms
Lead Channel Setting Sensing Sensitivity: 0.5 mV
Pulse Gen Serial Number: 652491
Zone Setting Status: 755011

## 2024-11-30 ENCOUNTER — Other Ambulatory Visit: Payer: Self-pay

## 2024-11-30 NOTE — Progress Notes (Signed)
 Remote ICD Transmission

## 2024-12-02 ENCOUNTER — Other Ambulatory Visit (HOSPITAL_COMMUNITY): Payer: Self-pay | Admitting: Internal Medicine

## 2024-12-02 ENCOUNTER — Other Ambulatory Visit: Payer: Self-pay | Admitting: Physician Assistant

## 2024-12-02 ENCOUNTER — Other Ambulatory Visit: Payer: Self-pay

## 2024-12-02 DIAGNOSIS — E1141 Type 2 diabetes mellitus with diabetic mononeuropathy: Secondary | ICD-10-CM

## 2024-12-03 ENCOUNTER — Other Ambulatory Visit: Payer: Self-pay

## 2024-12-03 ENCOUNTER — Ambulatory Visit: Admitting: Physician Assistant

## 2024-12-03 ENCOUNTER — Encounter: Payer: Self-pay | Admitting: Physician Assistant

## 2024-12-03 VITALS — BP 142/104 | HR 78 | Temp 98.4°F | Ht 71.0 in | Wt 370.0 lb

## 2024-12-03 DIAGNOSIS — I10 Essential (primary) hypertension: Secondary | ICD-10-CM

## 2024-12-03 DIAGNOSIS — G478 Other sleep disorders: Secondary | ICD-10-CM

## 2024-12-03 DIAGNOSIS — E1141 Type 2 diabetes mellitus with diabetic mononeuropathy: Secondary | ICD-10-CM | POA: Diagnosis not present

## 2024-12-03 DIAGNOSIS — R29898 Other symptoms and signs involving the musculoskeletal system: Secondary | ICD-10-CM | POA: Diagnosis not present

## 2024-12-03 DIAGNOSIS — Z7985 Long-term (current) use of injectable non-insulin antidiabetic drugs: Secondary | ICD-10-CM

## 2024-12-03 MED ORDER — ROSUVASTATIN CALCIUM 40 MG PO TABS
40.0000 mg | ORAL_TABLET | Freq: Every day | ORAL | 0 refills | Status: AC
  Filled 2024-12-03: qty 30, 30d supply, fill #0

## 2024-12-03 MED ORDER — DAPAGLIFLOZIN PROPANEDIOL 10 MG PO TABS
10.0000 mg | ORAL_TABLET | Freq: Every day | ORAL | 5 refills | Status: AC
  Filled 2024-12-03: qty 30, 30d supply, fill #0

## 2024-12-03 MED ORDER — MOUNJARO 10 MG/0.5ML ~~LOC~~ SOAJ
10.0000 mg | SUBCUTANEOUS | 0 refills | Status: AC
  Filled 2024-12-03 – 2024-12-17 (×2): qty 4, 56d supply, fill #0

## 2024-12-03 MED ORDER — SPIRONOLACTONE 25 MG PO TABS
25.0000 mg | ORAL_TABLET | Freq: Every day | ORAL | 0 refills | Status: AC
  Filled 2024-12-03: qty 30, 30d supply, fill #0

## 2024-12-03 MED ORDER — SACUBITRIL-VALSARTAN 97-103 MG PO TABS
1.0000 | ORAL_TABLET | Freq: Two times a day (BID) | ORAL | 0 refills | Status: AC
  Filled 2024-12-03: qty 60, 30d supply, fill #0

## 2024-12-03 NOTE — Assessment & Plan Note (Signed)
 Elevated x 2 in clinic today, but patient has not yet taken his blood pressure medicine.  Encourage compliance with medication and home BP monitoring.    Reviewed the Seven S's of hypertension including soil (encouraged plant-forward whole food eating habits), salt (intake <2g), smoking, stimulants (e.g. caffeine), stress, sleep (quantity, quality, timing, snoring/OSA), and sedentary lifestyle (aim for 150 active minutes per week).

## 2024-12-03 NOTE — Progress Notes (Signed)
 Date:  12/03/2024   Name:  Robert Mccann   DOB:  [birth year]   MRN:  [id number]   Chief Complaint: Diabetes  HPI  Robert Mccann presents for 1 mo f/u on DM2 as we titrate up on Mounjaro . A1c last month 7.2%. Dose was increased to 7.5 mg. He is up about 9 lb since last visit. He has not been working out as much, still bowling, drinks about 2-3 glasses of juice daily.   Does not monitor BP at home, but his mother has a cuff he could use. Has not taken his BP medication today but generally is compliant with it.   Has been working on the fruit and vegetable intake, now eating broccoli and brussels sprouts. Still not eating much fruit.   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 Left knee occasionally pops. Is not painful at baseline.   Sleep is not very good, patient mentions that he often wakes up early in the morning even if he goes to bed late at night.  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  Sleep study has been completed and does not suggest sleep apnea.  Medication list has been reviewed and updated.  Current Meds  Medication Sig   apixaban  (ELIQUIS ) 5 MG TABS tablet Take 1 tablet (5 mg total) by mouth 2 (two) times daily.   Blood Glucose Monitoring Suppl (BLOOD GLUCOSE MONITOR SYSTEM) w/Device KIT Use up to four times daily as directed.   ezetimibe  (ZETIA ) 10 MG tablet Take 1 tablet (10 mg total) by mouth daily.   Fluocinolone  Acetonide Body 0.01 % OIL Apply 1 Application topically 2 (two) times daily as needed. Apply to scalp, face, beard up to 2 times a day as needed for scaling, itching, redness.   ketoconazole  (NIZORAL ) 2 % cream Apply 1 Application topically 2 (two) times daily.   ketoconazole  (NIZORAL ) 2 % shampoo Apply 1 Application topically once for 1 dose. apply two times per week, massage into scalp and leave in for 3-5 minutes before rinsing out   metFORMIN  (GLUCOPHAGE ) 500 MG tablet Take 1 tablet (500 mg total) by mouth 2 (two) times  daily with a meal.   metoprolol  succinate (TOPROL -XL) 100 MG 24 hr tablet Take 1 tablet (100 mg total) by mouth daily.   MOUNJARO  10 MG/0.5ML Pen Inject 10 mg into the skin once a week.   potassium chloride  (KLOR-CON ) 20 MEQ packet Dissolve and take 60 mEq (3 packets) by mouth daily.   rosuvastatin  (CRESTOR ) 40 MG tablet Take 1 tablet (40 mg total) by mouth daily.   sacubitril -valsartan  (ENTRESTO ) 97-103 MG Take 1 tablet by mouth 2 (two) times daily.   sildenafil  (VIAGRA ) 50 MG tablet Take 1 tablet (50 mg total) by mouth daily as needed for erectile dysfunction.   spironolactone  (ALDACTONE ) 25 MG tablet Take 1 tablet (25 mg total) by mouth daily.   torsemide  (DEMADEX ) 20 MG tablet Take 3 tablets (60 mg total) by mouth daily.   [DISCONTINUED] dapagliflozin  propanediol (FARXIGA ) 10 MG TABS tablet Take 1 tablet (10 mg total) by mouth daily.   [DISCONTINUED] ketoconazole  (NIZORAL ) 2 % shampoo Apply topically 2 (two) times a week.   [DISCONTINUED] MOUNJARO  7.5 MG/0.5ML Pen Inject 7.5 mg into the skin once a week.     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) 01/31/2024   ICD (implantable cardioverter-defibrillator) in place 02/25/2023   NICM (nonischemic cardiomyopathy) (HCC)    Chronic HFrEF (heart failure with reduced ejection fraction) (HCC)    Essential hypertension 03/21/2022   History of non-ST elevation myocardial infarction  (NSTEMI) 03/13/2022   Hyperlipidemia associated with type 2 diabetes mellitus (HCC) 03/13/2022   History of CVA (cerebrovascular accident) 11/05/2021   History of pulmonary embolus (PE) 06/28/2021   Class 3 severe obesity with serious comorbidity and body mass index (BMI) of 45.0 to 49.9 in adult [facility])    Type 2 diabetes mellitus with diabetic mononeuropathy, without long-term current use of insulin  (HCC) 06/27/2021    No Known Allergies  Immunization History  Administered Date(s) Administered   Influenza Inj Mdck Quad Pf 09/25/2020   Influenza, Seasonal, Injecte, Preservative Fre 11/03/2024   Influenza,inj,Quad PF,6+ Mos 10/06/2019   Influenza-Unspecified 09/25/2020, 10/02/2022, 08/15/2023   PPD Test 09/23/2020, 01/27/2022   Tdap 11/03/2024    Past Surgical History:  Procedure Laterality Date   COLONOSCOPY N/A 05/18/2022   Procedure: COLONOSCOPY;  Surgeon: Federico Rosario BROCKS, MD;  Location: [facility] ENDOSCOPY;  Service: Gastroenterology;  Laterality: N/A;   HAND SURGERY Left age 42   HAND SURGERY     HEMOSTASIS CLIP PLACEMENT  05/18/2022   Procedure: HEMOSTASIS CLIP PLACEMENT;  Surgeon: Federico Rosario BROCKS, MD;  Location: [facility] ENDOSCOPY;  Service: Gastroenterology;;   ICD IMPLANT N/A 02/25/2023   Procedure: ICD IMPLANT;  Surgeon: Cindie Ole DASEN, MD;  Location: [facility] INVASIVE CV LAB;  Service: Cardiovascular;  Laterality: N/A;   POLYPECTOMY  05/18/2022   Procedure: POLYPECTOMY;  Surgeon: Federico Rosario BROCKS, MD;  Location: [facility] ENDOSCOPY;  Service: Gastroenterology;;   PULMONARY THROMBECTOMY N/A 06/28/2021   Procedure: PULMONARY THROMBECTOMY;  Surgeon: Marea Selinda RAMAN, MD;  Location: [facility] INVASIVE CV LAB;  Service: Cardiovascular;  Laterality: N/A;   TEE WITHOUT CARDIOVERSION N/A 11/08/2021   Procedure: TRANSESOPHAGEAL ECHOCARDIOGRAM (TEE);  Surgeon: Darliss Rogue, MD;  Location: [facility] ORS;  Service: Cardiovascular;  Laterality: N/A;    Social History   Tobacco Use   Smoking status: Never   Smokeless  tobacco: Never  Vaping Use   Vaping status: Never Used  Substance Use Topics   Alcohol use: Not Currently   Drug use: Not Currently    Types: Marijuana    Family History  Problem Relation Age of Onset   Stroke Mother    Diabetes Mother    Heart disease Mother    Heart failure Mother    Cerebrovascular Accident Mother    Diabetes Father    Hypertension Father    Heart disease Maternal Uncle    Diabetes Paternal Uncle    Heart disease Maternal Grandmother    Diabetes Maternal Grandfather    Hypertension Paternal Grandmother    Diabetes Paternal Grandfather    Cancer Paternal Grandfather    Cancer Cousin        unknown origin   Cancer Cousin        unknown origing        12/03/2024   10:47 AM 11/03/2024    9:19 AM 07/07/2024    1:17 PM  04/13/2024    8:15 AM  GAD 7 : Generalized Anxiety Score  Nervous, Anxious, on Edge 0 0 0 0  Control/stop worrying 0 0 0 0  Worry too much - different things 0 0 0 0  Trouble relaxing 0 0 0 0  Restless 0 0 0 0  Easily annoyed or irritable 0 0 0 0  Afraid - awful might happen 0 0 0 0  Total GAD 7 Score 0 0 0 0  Anxiety Difficulty Not difficult at all Not difficult at all Not difficult at all Not difficult at all       12/03/2024   10:47 AM 11/03/2024    9:18 AM 07/07/2024    1:17 PM  Depression screen PHQ 2/9  Decreased Interest 0 0 0  Down, Depressed, Hopeless 0 0 0  PHQ - 2 Score 0 0 0  Altered sleeping  0   Tired, decreased energy  0   Change in appetite  0   Feeling bad or failure about yourself   0   Trouble concentrating  0   Moving slowly or fidgety/restless  0   Suicidal thoughts  0   PHQ-9 Score  0    Difficult doing work/chores  Not difficult at all      Data saved with a previous flowsheet row definition    BP Readings from Last 3 Encounters:  12/03/24 (!) 142/104  11/03/24 130/88  11/02/24 (!) 154/96    Wt Readings from Last 3 Encounters:  12/03/24 (!) 370 lb (167.8 kg)  11/03/24 (!) 361 lb (163.7 kg)   11/02/24 (!) 369 lb 4 oz (167.5 kg)    BP (!) 142/104 Comment (Cuff Size): XL  Pulse 78   Temp 98.4 F (36.9 C)   Ht 5' 11 (1.803 m)   Wt (!) 370 lb (167.8 kg)   SpO2 98%   BMI 51.60 kg/m   Physical Exam Vitals and nursing note reviewed.  Constitutional:      Appearance: Normal appearance. He is obese.  Cardiovascular:     Rate and Rhythm: Normal rate.  Pulmonary:     Effort: Pulmonary effort is normal.  Abdominal:     General: There is no distension.  Musculoskeletal:        General: Normal range of motion.  Skin:    General: Skin is warm and dry.  Neurological:     Mental Status: He is alert and oriented to person, place, and time.     Gait: Gait is intact.  Psychiatric:        Mood and Affect: Mood and affect normal.     Recent Labs     Component Value Date/Time   NA 138 06/02/2024 1107   K 4.9 06/02/2024 1107   CL 103 06/02/2024 1107   CO2 21 06/02/2024 1107   GLUCOSE 108 (H) 06/02/2024 1107   GLUCOSE 137 (H) 01/27/2024 1127   BUN 11 06/02/2024 1107   CREATININE 1.01 06/02/2024 1107   CALCIUM  9.4 06/02/2024 1107   PROT 7.6 03/13/2024 1135   ALBUMIN 4.3 03/13/2024 1135   AST 34 03/13/2024 1135   ALT 39 03/13/2024 1135   ALKPHOS 125 (H) 03/13/2024 1135   BILITOT 0.3 03/13/2024 1135   GFRNONAA >60 01/27/2024 1127    Lab Results  Component Value Date   WBC 5.7 08/28/2023   HGB 15.4 08/28/2023   HCT 45.0 08/28/2023   MCV 88 08/28/2023   PLT 375 08/28/2023   Lab Results  Component  Value Date   HGBA1C 7.2 (A) 11/03/2024   HGBA1C 7.1 (A) 07/07/2024   HGBA1C 7.5 (A) 03/12/2024   Lab Results  Component Value Date   CHOL 131 03/13/2024   HDL 42 03/13/2024   LDLCALC 67 03/13/2024   TRIG 124 03/13/2024   CHOLHDL 3.1 03/13/2024   Lab Results  Component Value Date   TSH 5.035 (H) 03/17/2022      Assessment and Plan:  Type 2 diabetes mellitus with diabetic mononeuropathy, without long-term current use of insulin  (HCC) Assessment &  Plan: Increase Mounjaro  to 10 mg weekly.  Decrease consumption of juice, increase whole fruits and vegetables.  Advised the importance of adequate protein intake on this medication as well as resistance training which will not only improve insulin  sensitivity but also build muscle mass and reduce risk of atrophy.  I recommended minimum 20 min sessions 3x/wk. Advised importance of portion control and slower eating on this medication to minimize GI side effects. Avoid high fat foods as these may cause discomfort. Patient aware of potential for weight regain when eventually stopping the medication. For this reason, continued efforts toward improving lifestyle will be particularly important moving forward.  Orders: -     Mounjaro ; Inject 10 mg into the skin once a week.  Dispense: 4 mL; Refill: 0 -     Dapagliflozin  Propanediol; Take 1 tablet (10 mg total) by mouth daily.  Dispense: 30 tablet; Refill: 5  Essential hypertension Assessment & Plan: Elevated x 2 in clinic today, but patient has not yet taken his blood pressure medicine.  Encourage compliance with medication and home BP monitoring.    Reviewed the Seven S's of hypertension including soil (encouraged plant-forward whole food eating habits), salt (intake <2g), smoking, stimulants (e.g. caffeine), stress, sleep (quantity, quality, timing, snoring/OSA), and sedentary lifestyle (aim for 150 active minutes per week).     Popping of left knee joint Assessment & Plan: Encouraged patient to purchase OTC compression sleeve for added stability of the knee during the day.  Reviewed exercises that can strengthen the knee.   Poor sleep pattern Assessment & Plan: Try melatonin 1 to 2 hours before bed, as I do believe this is probably a circadian rhythm issue.  Also encouraged to view low angle sunlight in the morning and evening whenever possible.  Strength training should also help     Follow-up 2 months OV DM2 with A1c   Rolan Hoyle,  PA-C, DMSc, Nutritionist [facility] [phone]

## 2024-12-03 NOTE — Patient Instructions (Signed)
 Reviewed the Seven S's of hypertension including soil (encouraged plant-forward whole food eating habits), salt (intake <2g), smoking, stimulants (e.g. caffeine), stress, sleep (quantity, quality, timing, snoring/OSA), and sedentary lifestyle (aim for 150 active minutes per week).

## 2024-12-03 NOTE — Assessment & Plan Note (Addendum)
 Encouraged patient to purchase OTC compression sleeve for added stability of the knee during the day.  Reviewed exercises that can strengthen the knee.

## 2024-12-03 NOTE — Assessment & Plan Note (Signed)
 Try melatonin 1 to 2 hours before bed, as I do believe this is probably a circadian rhythm issue.  Also encouraged to view low angle sunlight in the morning and evening whenever possible.  Strength training should also help

## 2024-12-03 NOTE — Assessment & Plan Note (Signed)
 Increase Mounjaro  to 10 mg weekly.  Decrease consumption of juice, increase whole fruits and vegetables.  Advised the importance of adequate protein intake on this medication as well as resistance training which will not only improve insulin  sensitivity but also build muscle mass and reduce risk of atrophy.  I recommended minimum 20 min sessions 3x/wk. Advised importance of portion control and slower eating on this medication to minimize GI side effects. Avoid high fat foods as these may cause discomfort. Patient aware of potential for weight regain when eventually stopping the medication. For this reason, continued efforts toward improving lifestyle will be particularly important moving forward.

## 2024-12-12 NOTE — Progress Notes (Deleted)
 "     Electrophysiology Clinic Note    Date:  12/12/2024  Patient ID:  Robert Mccann [birth year], MRN [id number] PCP:  Manya Toribio SQUIBB, PA  Cardiologist:  Redell Cave, MD  Electrophysiologist:  OLE ONEIDA HOLTS, MD  Electrophysiology APP:  Makayela Secrest, NP  Advanced Heart Failure:  Toribio Fuel, MD    ***refresh  Discussed the use of AI scribe software for clinical note transcription with the patient, who gave verbal consent to proceed.   Patient Profile    Chief Complaint: ***  History of Present Illness: Robert Mccann is a 28 y.o. male with PMH notable for HFrEF iso LV non-compaction, s/p ICD, CVA, PE s/p thrombectomy, HTN, T2M; seen today for OLE ONEIDA HOLTS, MD for routine electrophysiology followup.   He last saw Dr. Holts 05/2023 for routine 90d follow-up after ICD implant without concerns. He saw Dr. Bensimhon 10/2024, volume up and increased torsemide  to 60mg  daily.   On follow-up today, ***fluid status, exercise tolerance  *** pacing percent? Reduce LR or sleep rate?  - needs labs  - full device check  - talk about CL > JP  Since last being seen in our clinic the patient reports doing ***.  he denies chest pain, palpitations, dyspnea, PND, orthopnea, nausea, vomiting, dizziness, syncope, edema, weight gain, or early satiety.      Arrhythmia/Device History Bos Sci dual chamber ICD, imp 01/2023; dx HFrEF    ROS:  Please see the history of present illness. All other systems are reviewed and otherwise negative.    Physical Exam    VS:  There were no vitals taken for this visit. BMI: There is no height or weight on file to calculate BMI.           Wt Readings from Last 3 Encounters:  12/03/24 (!) 370 lb (167.8 kg)  11/03/24 (!) 361 lb (163.7 kg)  11/02/24 (!) 369 lb 4 oz (167.5 kg)     GEN- The patient is well appearing, alert and oriented x 3 today.   Lungs- Clear to ausculation bilaterally, normal work of breathing.   Heart- {Blank single:19197::Regular,Irregularly irregular} rate and rhythm, no murmurs, rubs or gallops Extremities- {EDEMA LEVEL:28147::No} peripheral edema, warm, dry Skin-  *** device pocket well-healed, no tethering   Device interrogation done today and reviewed by myself:  Battery *** Lead thresholds, impedence, sensing stable *** *** episodes *** changes made today   Studies Reviewed   Previous EP, cardiology notes.    EKG is ordered. Personal review of EKG from today shows:  ***        TTE, 11/21/2023  1. Left ventricular ejection fraction, by estimation, is 35 to 40%. Left  ventricular ejection fraction by 3D volume is 37 %. The left ventricle has  moderately decreased function. The left ventricle demonstrates regional  wall motion abnormalities (mild  global hypokinesis with severe hypokinesis of the anterior adn  anteroseptal wall). There is mild left ventricular hypertrophy. Left  ventricular diastolic parameters are consistent with Grade I diastolic  dysfunction (impaired relaxation). The average left   ventricular global longitudinal strain is -5.4 %.   2. Right ventricular systolic function is normal. The right ventricular  size is normal. There is normal pulmonary artery systolic pressure. The  estimated right ventricular systolic pressure is 19.3 mmHg.   3. The mitral valve is normal in structure. No evidence of mitral valve  regurgitation. No evidence of mitral stenosis.   4. The aortic valve is normal in structure.  Aortic valve regurgitation is  not visualized. No aortic stenosis is present.   5. The inferior vena cava is normal in size with greater than 50%  respiratory variability, suggesting right atrial pressure of 3 mmHg.      Assessment and Plan     #) NICM s/p ICD   #) ***   {Are you ordering a CV Procedure (e.g. stress test, cath, DCCV, TEE, etc)?   Press F2        :[id number]}   Current medicines are reviewed at length with the  patient today.   The patient {ACTIONS; HAS/DOES NOT HAVE:19233} concerns regarding his medicines.  The following changes were made today:  {NONE DEFAULTED:18576}  Labs/ tests ordered today include: *** No orders of the defined types were placed in this encounter.    Disposition: Follow up with {EPMDS:28135::EP Team} or EP APP {EPFOLLOW UP:28173}   Signed, Chantal Needle, NP  12/12/2024  10:43 AM  Electrophysiology [facility] "

## 2024-12-13 ENCOUNTER — Other Ambulatory Visit: Payer: Self-pay

## 2024-12-14 ENCOUNTER — Ambulatory Visit: Admitting: Cardiology

## 2024-12-16 NOTE — Progress Notes (Unsigned)
 Electrophysiology Clinic Note    Date:  12/17/2024  Patient ID:  Robert Mccann, Robert Mccann, Robert Mccann, MRN [id number] PCP:  Manya Toribio SQUIBB, PA  Cardiologist:  Redell Cave, MD  Electrophysiologist:  Fonda Kitty, MD  Electrophysiology APP:  Deejay Koppelman, NP  Advanced Heart Failure:  Toribio Fuel, MD      Discussed the use of AI scribe software for clinical note transcription with the patient, who gave verbal consent to proceed.   Patient Profile    Chief Complaint: ICD follow-up  History of Present Illness: Robert Mccann is a 28 y.o. male with PMH notable for HFrEF iso LV non-compaction, s/p ICD, CVA, PE s/p thrombectomy, HTN, T2M; seen today for Fonda Kitty, MD for routine electrophysiology followup.   He last saw Dr. Cindie 05/2023 for routine 90d follow-up after ICD implant without concerns. He saw Dr. Bensimhon 10/2024, volume up and increased torsemide  to 60mg  daily.   On follow-up today, he denies cardiac concerns today.  Specifically denies chest pain, palpitations, dizziness, lightheadedness or concerns related to his ICD site.  He has continued to take 60 mg torsemide  daily, did not take prior to this morning's visit but plans to take he returns home. His only complaint today is left knee pain.  He continues to bowl regularly and recently played pickle ball without concerns.    Arrhythmia/Device History Bos Sci dual chamber ICD, imp 01/2023; dx HFrEF    ROS:  Please see the history of present illness. All other systems are reviewed and otherwise negative.    Physical Exam    VS:  BP 110/66 (BP Location: Left Arm, Patient Position: Sitting, Cuff Size: Large)   Pulse 80   Ht 5' 11 (1.803 m)   Wt (!) 367 lb 6.4 oz (166.7 kg)   SpO2 97%   BMI 51.24 kg/m  BMI: Body mass index is 51.24 kg/m.           Wt Readings from Last 3 Encounters:  12/17/24 (!) 367 lb 6.4 oz (166.7 kg)  12/03/24 (!) 370 lb (167.8 kg)  11/03/24 (!) 361 lb (163.7  kg)     GEN- The patient is well appearing, alert and oriented x 3 today.   Lungs- Clear to ausculation bilaterally, normal work of breathing.  Heart- Regular rate and rhythm, no murmurs, rubs or gallops Extremities- Trace peripheral edema, warm, dry Skin-  device pocket well-healed, no tethering   Device interrogation done today and reviewed by myself:  Battery 11.5 years Lead thresholds, impedence, sensing stable  V pacing less than 1% Two brief NSVT episodes, seconds in duration during sinus tach/atach episode HeartLogic 6 No changes made today   Studies Reviewed   Previous EP, cardiology notes.    EKG is ordered. Personal review of EKG from today shows:    EKG Interpretation Date/Time:  Thursday December 17 2024 09:21:43 EST Ventricular Rate:  80 PR Interval:  180 QRS Duration:  90 QT Interval:  416 QTC Calculation: 479 R Axis:   5  Text Interpretation: Normal sinus rhythm Confirmed by Dequavion Follette [phone]) on 12/17/2024 9:23:23 AM    TTE, 11/21/2023  1. Left ventricular ejection fraction, by estimation, is 35 to 40%. Left ventricular ejection fraction by 3D volume is 37 %. The left ventricle has moderately decreased function. The left ventricle demonstrates regional wall motion abnormalities (mild global hypokinesis with severe hypokinesis of the anterior adn anteroseptal wall). There is mild left ventricular hypertrophy. Left ventricular diastolic parameters are consistent with Grade  I diastolic  dysfunction (impaired relaxation). The average left  ventricular global longitudinal strain is -5.4 %.   2. Right ventricular systolic function is normal. The right ventricular size is normal. There is normal pulmonary artery systolic pressure. The estimated right ventricular systolic pressure is 19.3 mmHg.   3. The mitral valve is normal in structure. No evidence of mitral valve regurgitation. No evidence of mitral stenosis.   4. The aortic valve is normal in structure. Aortic  valve regurgitation is not visualized. No aortic stenosis is present.   5. The inferior vena cava is normal in size with greater than 50% respiratory variability, suggesting right atrial pressure of 3 mmHg.    Assessment and Plan     #) NICM s/p ICD ICD functioning well, see Paceart for details No sustained ventricular arrhythmias. V pacing less than 1% Heart logic stable  #) HFrEF Follows regularly with heart failure team HeartLogic stable Slight lower extremity edema GDMT includes 10 mg Farxiga , 100 mg metoprolol , 97-103 Entresto , 25 mg Spyro Diuretic 60 mg torsemide  daily Update labs as previously ordered by Dr. Cherrie       Current medicines are reviewed at length with the patient today.   The patient does not have concerns regarding his medicines.  The following changes were made today:  none  Labs/ tests ordered today include:  Orders Placed This Encounter  Procedures   B Nat Peptide   Basic metabolic panel with GFR   EKG 87-Ozji     Disposition: Follow up with Dr. Kennyth or EP APP in 12 months, continue remote monitoring  Follow-up with HF in 3 months as previously recommended   Signed, Jhase Creppel, NP  12/17/2024  12:38 PM  Electrophysiology [facility]

## 2024-12-17 ENCOUNTER — Ambulatory Visit: Admitting: Cardiology

## 2024-12-17 ENCOUNTER — Other Ambulatory Visit: Payer: Self-pay

## 2024-12-17 ENCOUNTER — Encounter: Payer: Self-pay | Admitting: Cardiology

## 2024-12-17 DIAGNOSIS — I5022 Chronic systolic (congestive) heart failure: Secondary | ICD-10-CM

## 2024-12-17 DIAGNOSIS — I428 Other cardiomyopathies: Secondary | ICD-10-CM | POA: Diagnosis not present

## 2024-12-17 DIAGNOSIS — Z95810 Presence of automatic (implantable) cardiac defibrillator: Secondary | ICD-10-CM | POA: Diagnosis not present

## 2024-12-17 LAB — CUP PACEART INCLINIC DEVICE CHECK
Date Time Interrogation Session: 20251218124137
HighPow Impedance: 69 Ohm
Implantable Lead Connection Status: 753985
Implantable Lead Connection Status: 753985
Implantable Lead Implant Date: 20240227
Implantable Lead Implant Date: 20240227
Implantable Lead Location: 753859
Implantable Lead Location: 753860
Implantable Lead Model: 673
Implantable Lead Model: 7841
Implantable Lead Serial Number: 1407551
Implantable Lead Serial Number: 209112
Implantable Pulse Generator Implant Date: 20240227
Lead Channel Impedance Value: 387 Ohm
Lead Channel Impedance Value: 826 Ohm
Lead Channel Pacing Threshold Amplitude: 0.8 V
Lead Channel Pacing Threshold Amplitude: 1.1 V
Lead Channel Pacing Threshold Pulse Width: 0.4 ms
Lead Channel Pacing Threshold Pulse Width: 0.4 ms
Lead Channel Sensing Intrinsic Amplitude: 24.5 mV
Lead Channel Sensing Intrinsic Amplitude: 7.6 mV
Lead Channel Setting Pacing Amplitude: 2 V
Lead Channel Setting Pacing Amplitude: 2.5 V
Lead Channel Setting Pacing Pulse Width: 0.4 ms
Lead Channel Setting Sensing Sensitivity: 0.5 mV
Pulse Gen Serial Number: 652491
Zone Setting Status: 755011

## 2024-12-17 NOTE — Patient Instructions (Signed)
 Medication Instructions:  Your physician recommends that you continue on your current medications as directed. Please refer to the Current Medication list given to you today.  *If you need a refill on your cardiac medications before your next appointment, please call your pharmacy*  Lab Work: No labs ordered today    Testing/Procedures: No test ordered today   Follow-Up: At [facility], you and your health needs are our priority.  As part of our continuing mission to provide you with exceptional heart care, our providers are all part of one team.  This team includes your primary Cardiologist (physician) and Advanced Practice Providers or APPs (Physician Assistants and Nurse Practitioners) who all work together to provide you with the care you need, when you need it.  Your next appointment:   1 year(s)  Provider:   Suzann Riddle, NP or Dr. Fonda Kitty

## 2024-12-18 ENCOUNTER — Ambulatory Visit: Payer: Self-pay | Admitting: Cardiology

## 2024-12-19 LAB — BASIC METABOLIC PANEL WITH GFR
BUN/Creatinine Ratio: 11 (ref 9–20)
BUN: 13 mg/dL (ref 6–20)
CO2: 20 mmol/L (ref 20–29)
Calcium: 9.6 mg/dL (ref 8.7–10.2)
Chloride: 103 mmol/L (ref 96–106)
Creatinine, Ser: 1.14 mg/dL (ref 0.76–1.27)
Glucose: 143 mg/dL — ABNORMAL HIGH (ref 70–99)
Potassium: 4.8 mmol/L (ref 3.5–5.2)
Sodium: 136 mmol/L (ref 134–144)
eGFR: 89 mL/min/1.73

## 2024-12-19 LAB — BRAIN NATRIURETIC PEPTIDE: BNP: 28.5 pg/mL (ref 0.0–100.0)

## 2025-01-01 NOTE — Telephone Encounter (Signed)
 FYI  KP

## 2025-02-04 ENCOUNTER — Ambulatory Visit: Admitting: Physician Assistant

## 2025-02-09 ENCOUNTER — Ambulatory Visit: Admitting: Genetic Counselor

## 2025-02-23 ENCOUNTER — Ambulatory Visit: Admitting: Physician Assistant

## 2025-11-18 ENCOUNTER — Ambulatory Visit: Admitting: Dermatology
